# Patient Record
Sex: Female | Born: 1950 | Race: White | Hispanic: No | Smoking: Former smoker
Health system: Southern US, Community
[De-identification: ages and names within clinical notes are randomized; demographics above are authoritative.]

## PROBLEM LIST (undated history)

## (undated) DIAGNOSIS — K219 Gastro-esophageal reflux disease without esophagitis: Secondary | ICD-10-CM

## (undated) DIAGNOSIS — I2510 Atherosclerotic heart disease of native coronary artery without angina pectoris: Secondary | ICD-10-CM

## (undated) DIAGNOSIS — D045 Carcinoma in situ of skin of trunk: Secondary | ICD-10-CM

## (undated) DIAGNOSIS — R112 Nausea with vomiting, unspecified: Secondary | ICD-10-CM

## (undated) DIAGNOSIS — Z9889 Other specified postprocedural states: Secondary | ICD-10-CM

## (undated) DIAGNOSIS — K2100 Gastro-esophageal reflux disease with esophagitis, without bleeding: Secondary | ICD-10-CM

## (undated) DIAGNOSIS — I7300 Raynaud's syndrome without gangrene: Secondary | ICD-10-CM

## (undated) DIAGNOSIS — D649 Anemia, unspecified: Secondary | ICD-10-CM

## (undated) DIAGNOSIS — R7303 Prediabetes: Secondary | ICD-10-CM

## (undated) DIAGNOSIS — J849 Interstitial pulmonary disease, unspecified: Secondary | ICD-10-CM

## (undated) DIAGNOSIS — Z87442 Personal history of urinary calculi: Secondary | ICD-10-CM

## (undated) DIAGNOSIS — Z1211 Encounter for screening for malignant neoplasm of colon: Secondary | ICD-10-CM

## (undated) DIAGNOSIS — I639 Cerebral infarction, unspecified: Secondary | ICD-10-CM

## (undated) DIAGNOSIS — F17201 Nicotine dependence, unspecified, in remission: Secondary | ICD-10-CM

## (undated) DIAGNOSIS — R0989 Other specified symptoms and signs involving the circulatory and respiratory systems: Secondary | ICD-10-CM

## (undated) DIAGNOSIS — E782 Mixed hyperlipidemia: Secondary | ICD-10-CM

## (undated) DIAGNOSIS — Z8719 Personal history of other diseases of the digestive system: Secondary | ICD-10-CM

## (undated) DIAGNOSIS — H269 Unspecified cataract: Secondary | ICD-10-CM

## (undated) DIAGNOSIS — C44612 Basal cell carcinoma of skin of right upper limb, including shoulder: Secondary | ICD-10-CM

## (undated) DIAGNOSIS — I10 Essential (primary) hypertension: Secondary | ICD-10-CM

## (undated) DIAGNOSIS — I739 Peripheral vascular disease, unspecified: Secondary | ICD-10-CM

## (undated) DIAGNOSIS — R7401 Elevation of levels of liver transaminase levels: Secondary | ICD-10-CM

## (undated) DIAGNOSIS — R0602 Shortness of breath: Secondary | ICD-10-CM

## (undated) DIAGNOSIS — N3281 Overactive bladder: Secondary | ICD-10-CM

## (undated) DIAGNOSIS — I341 Nonrheumatic mitral (valve) prolapse: Secondary | ICD-10-CM

## (undated) DIAGNOSIS — F419 Anxiety disorder, unspecified: Secondary | ICD-10-CM

## (undated) DIAGNOSIS — M8580 Other specified disorders of bone density and structure, unspecified site: Secondary | ICD-10-CM

## (undated) DIAGNOSIS — I6529 Occlusion and stenosis of unspecified carotid artery: Secondary | ICD-10-CM

## (undated) DIAGNOSIS — T7840XA Allergy, unspecified, initial encounter: Secondary | ICD-10-CM

## (undated) DIAGNOSIS — J302 Other seasonal allergic rhinitis: Secondary | ICD-10-CM

## (undated) DIAGNOSIS — J449 Chronic obstructive pulmonary disease, unspecified: Secondary | ICD-10-CM

## (undated) DIAGNOSIS — C439 Malignant melanoma of skin, unspecified: Secondary | ICD-10-CM

## (undated) DIAGNOSIS — K921 Melena: Secondary | ICD-10-CM

## (undated) HISTORY — PX: HERNIA REPAIR: SHX51

## (undated) HISTORY — DX: Elevation of levels of liver transaminase levels: R74.01

## (undated) HISTORY — DX: Essential (primary) hypertension: I10

## (undated) HISTORY — DX: Other specified postprocedural states: Z98.890

## (undated) HISTORY — DX: Raynaud's syndrome without gangrene: I73.00

## (undated) HISTORY — DX: Overactive bladder: N32.81

## (undated) HISTORY — DX: Chronic obstructive pulmonary disease, unspecified: J44.9

## (undated) HISTORY — DX: Gastro-esophageal reflux disease with esophagitis, without bleeding: K21.00

## (undated) HISTORY — PX: COLONOSCOPY: SHX174

## (undated) HISTORY — DX: Melena: K92.1

## (undated) HISTORY — DX: Cerebral infarction, unspecified: I63.9

## (undated) HISTORY — DX: Nicotine dependence, unspecified, in remission: F17.201

## (undated) HISTORY — DX: Interstitial pulmonary disease, unspecified: J84.9

## (undated) HISTORY — DX: Basal cell carcinoma of skin of right upper limb, including shoulder: C44.612

## (undated) HISTORY — DX: Unspecified cataract: H26.9

## (undated) HISTORY — DX: Personal history of other diseases of the digestive system: Z87.19

## (undated) HISTORY — DX: Other seasonal allergic rhinitis: J30.2

## (undated) HISTORY — DX: Occlusion and stenosis of unspecified carotid artery: I65.29

## (undated) HISTORY — DX: Prediabetes: R73.03

## (undated) HISTORY — DX: Carcinoma in situ of skin of trunk: D04.5

## (undated) HISTORY — DX: Peripheral vascular disease, unspecified: I73.9

## (undated) HISTORY — DX: Other specified symptoms and signs involving the circulatory and respiratory systems: R09.89

## (undated) HISTORY — DX: Gastro-esophageal reflux disease without esophagitis: K21.9

## (undated) HISTORY — DX: Anxiety disorder, unspecified: F41.9

## (undated) HISTORY — DX: Gilbert syndrome: E80.4

## (undated) HISTORY — DX: Allergy, unspecified, initial encounter: T78.40XA

## (undated) HISTORY — PX: SINOSCOPY: SHX187

## (undated) HISTORY — DX: Nonrheumatic mitral (valve) prolapse: I34.1

## (undated) HISTORY — DX: Malignant melanoma of skin, unspecified: C43.9

## (undated) HISTORY — PX: OTHER SURGICAL HISTORY: SHX169

## (undated) HISTORY — DX: Encounter for screening for malignant neoplasm of colon: Z12.11

## (undated) HISTORY — DX: Shortness of breath: R06.02

## (undated) HISTORY — DX: Mixed hyperlipidemia: E78.2

## (undated) HISTORY — PX: TUBAL LIGATION: SHX77

## (undated) HISTORY — DX: Gastro-esophageal reflux disease with esophagitis: K21.0

## (undated) HISTORY — PX: ESOPHAGOGASTRODUODENOSCOPY: SHX1529

## (undated) HISTORY — DX: Other specified disorders of bone density and structure, unspecified site: M85.80

## (undated) HISTORY — DX: Nausea with vomiting, unspecified: R11.2

---

## 1995-09-27 HISTORY — PX: ABDOMINAL HYSTERECTOMY: SHX81

## 1998-07-06 ENCOUNTER — Ambulatory Visit (HOSPITAL_COMMUNITY): Admission: RE | Admit: 1998-07-06 | Discharge: 1998-07-06 | Payer: Self-pay

## 1998-08-05 ENCOUNTER — Encounter: Payer: Self-pay | Admitting: General Surgery

## 1998-08-06 ENCOUNTER — Ambulatory Visit (HOSPITAL_COMMUNITY): Admission: RE | Admit: 1998-08-06 | Discharge: 1998-08-07 | Payer: Self-pay | Admitting: General Surgery

## 1998-09-11 ENCOUNTER — Ambulatory Visit (HOSPITAL_COMMUNITY): Admission: RE | Admit: 1998-09-11 | Discharge: 1998-09-11 | Payer: Self-pay | Admitting: Gastroenterology

## 1998-09-26 HISTORY — PX: CHOLECYSTECTOMY: SHX55

## 1998-12-15 ENCOUNTER — Encounter: Payer: Self-pay | Admitting: Gastroenterology

## 1998-12-15 ENCOUNTER — Ambulatory Visit (HOSPITAL_COMMUNITY): Admission: RE | Admit: 1998-12-15 | Discharge: 1998-12-15 | Payer: Self-pay | Admitting: Gastroenterology

## 1998-12-30 ENCOUNTER — Ambulatory Visit (HOSPITAL_COMMUNITY): Admission: RE | Admit: 1998-12-30 | Discharge: 1998-12-30 | Payer: Self-pay | Admitting: Gastroenterology

## 1998-12-30 ENCOUNTER — Encounter: Payer: Self-pay | Admitting: Gastroenterology

## 1999-08-01 ENCOUNTER — Emergency Department (HOSPITAL_COMMUNITY): Admission: EM | Admit: 1999-08-01 | Discharge: 1999-08-01 | Payer: Self-pay | Admitting: *Deleted

## 1999-08-02 ENCOUNTER — Encounter: Payer: Self-pay | Admitting: Gastroenterology

## 1999-08-02 ENCOUNTER — Ambulatory Visit (HOSPITAL_COMMUNITY): Admission: RE | Admit: 1999-08-02 | Discharge: 1999-08-02 | Payer: Self-pay | Admitting: Gastroenterology

## 2000-05-31 ENCOUNTER — Ambulatory Visit (HOSPITAL_COMMUNITY): Admission: RE | Admit: 2000-05-31 | Discharge: 2000-05-31 | Payer: Self-pay | Admitting: Obstetrics and Gynecology

## 2000-05-31 ENCOUNTER — Encounter: Payer: Self-pay | Admitting: Obstetrics and Gynecology

## 2000-06-06 ENCOUNTER — Encounter: Admission: RE | Admit: 2000-06-06 | Discharge: 2000-06-06 | Payer: Self-pay | Admitting: Obstetrics and Gynecology

## 2000-06-06 ENCOUNTER — Encounter: Payer: Self-pay | Admitting: Obstetrics and Gynecology

## 2001-06-07 ENCOUNTER — Encounter: Admission: RE | Admit: 2001-06-07 | Discharge: 2001-06-07 | Payer: Self-pay | Admitting: Obstetrics and Gynecology

## 2001-06-07 ENCOUNTER — Encounter: Payer: Self-pay | Admitting: Obstetrics and Gynecology

## 2002-08-19 ENCOUNTER — Ambulatory Visit (HOSPITAL_COMMUNITY): Admission: RE | Admit: 2002-08-19 | Discharge: 2002-08-19 | Payer: Self-pay | Admitting: Gastroenterology

## 2002-08-19 ENCOUNTER — Encounter (INDEPENDENT_AMBULATORY_CARE_PROVIDER_SITE_OTHER): Payer: Self-pay | Admitting: *Deleted

## 2002-10-23 ENCOUNTER — Emergency Department (HOSPITAL_COMMUNITY): Admission: EM | Admit: 2002-10-23 | Discharge: 2002-10-23 | Payer: Self-pay | Admitting: Emergency Medicine

## 2002-10-23 ENCOUNTER — Encounter: Payer: Self-pay | Admitting: Emergency Medicine

## 2003-03-12 ENCOUNTER — Ambulatory Visit: Admission: RE | Admit: 2003-03-12 | Discharge: 2003-03-12 | Payer: Self-pay | Admitting: Gynecology

## 2003-03-12 ENCOUNTER — Encounter (INDEPENDENT_AMBULATORY_CARE_PROVIDER_SITE_OTHER): Payer: Self-pay | Admitting: *Deleted

## 2003-03-25 ENCOUNTER — Encounter: Admission: RE | Admit: 2003-03-25 | Discharge: 2003-03-25 | Payer: Self-pay | Admitting: Gynecology

## 2003-03-25 ENCOUNTER — Encounter: Payer: Self-pay | Admitting: Gynecology

## 2004-03-25 ENCOUNTER — Encounter: Admission: RE | Admit: 2004-03-25 | Discharge: 2004-03-25 | Payer: Self-pay | Admitting: Gynecology

## 2004-04-07 ENCOUNTER — Ambulatory Visit: Admission: RE | Admit: 2004-04-07 | Discharge: 2004-04-07 | Payer: Self-pay | Admitting: Gynecology

## 2004-05-12 ENCOUNTER — Ambulatory Visit (HOSPITAL_COMMUNITY): Admission: RE | Admit: 2004-05-12 | Discharge: 2004-05-12 | Payer: Self-pay | Admitting: Gynecology

## 2004-06-16 ENCOUNTER — Ambulatory Visit: Admission: RE | Admit: 2004-06-16 | Discharge: 2004-06-16 | Payer: Self-pay | Admitting: Gynecologic Oncology

## 2004-09-26 DIAGNOSIS — I639 Cerebral infarction, unspecified: Secondary | ICD-10-CM

## 2004-09-26 DIAGNOSIS — G459 Transient cerebral ischemic attack, unspecified: Secondary | ICD-10-CM

## 2004-09-26 HISTORY — DX: Cerebral infarction, unspecified: I63.9

## 2004-09-26 HISTORY — DX: Transient cerebral ischemic attack, unspecified: G45.9

## 2004-12-09 ENCOUNTER — Other Ambulatory Visit: Admission: RE | Admit: 2004-12-09 | Discharge: 2004-12-09 | Payer: Self-pay | Admitting: Gynecology

## 2004-12-27 ENCOUNTER — Ambulatory Visit (HOSPITAL_COMMUNITY): Admission: RE | Admit: 2004-12-27 | Discharge: 2004-12-27 | Payer: Self-pay | Admitting: Vascular Surgery

## 2004-12-27 ENCOUNTER — Observation Stay (HOSPITAL_COMMUNITY): Admission: EM | Admit: 2004-12-27 | Discharge: 2004-12-28 | Payer: Self-pay | Admitting: Emergency Medicine

## 2005-01-21 ENCOUNTER — Ambulatory Visit: Admission: RE | Admit: 2005-01-21 | Discharge: 2005-01-21 | Payer: Self-pay | Admitting: Gynecology

## 2005-03-06 ENCOUNTER — Encounter: Admission: RE | Admit: 2005-03-06 | Discharge: 2005-03-06 | Payer: Self-pay | Admitting: Neurology

## 2005-04-01 ENCOUNTER — Ambulatory Visit (HOSPITAL_COMMUNITY): Admission: RE | Admit: 2005-04-01 | Discharge: 2005-04-02 | Payer: Self-pay | Admitting: *Deleted

## 2005-06-08 ENCOUNTER — Encounter: Admission: RE | Admit: 2005-06-08 | Discharge: 2005-06-08 | Payer: Self-pay | Admitting: Gynecology

## 2005-06-30 ENCOUNTER — Other Ambulatory Visit: Admission: RE | Admit: 2005-06-30 | Discharge: 2005-06-30 | Payer: Self-pay | Admitting: Gynecology

## 2005-07-23 ENCOUNTER — Emergency Department (HOSPITAL_COMMUNITY): Admission: EM | Admit: 2005-07-23 | Discharge: 2005-07-23 | Payer: Self-pay | Admitting: Emergency Medicine

## 2005-09-26 HISTORY — PX: OTHER SURGICAL HISTORY: SHX169

## 2005-09-26 HISTORY — PX: TRANSTHORACIC ECHOCARDIOGRAM: SHX275

## 2005-12-29 ENCOUNTER — Other Ambulatory Visit: Admission: RE | Admit: 2005-12-29 | Discharge: 2005-12-29 | Payer: Self-pay | Admitting: Gynecology

## 2006-01-18 ENCOUNTER — Inpatient Hospital Stay (HOSPITAL_COMMUNITY): Admission: EM | Admit: 2006-01-18 | Discharge: 2006-01-19 | Payer: Self-pay | Admitting: Emergency Medicine

## 2006-02-08 ENCOUNTER — Encounter: Admission: RE | Admit: 2006-02-08 | Discharge: 2006-02-08 | Payer: Self-pay | Admitting: *Deleted

## 2006-05-30 ENCOUNTER — Ambulatory Visit (HOSPITAL_COMMUNITY): Admission: RE | Admit: 2006-05-30 | Discharge: 2006-05-30 | Payer: Self-pay | Admitting: *Deleted

## 2006-06-23 ENCOUNTER — Ambulatory Visit (HOSPITAL_COMMUNITY): Admission: RE | Admit: 2006-06-23 | Discharge: 2006-06-23 | Payer: Self-pay | Admitting: *Deleted

## 2006-07-06 ENCOUNTER — Encounter: Admission: RE | Admit: 2006-07-06 | Discharge: 2006-07-06 | Payer: Self-pay | Admitting: Gynecology

## 2006-07-19 ENCOUNTER — Other Ambulatory Visit: Admission: RE | Admit: 2006-07-19 | Discharge: 2006-07-19 | Payer: Self-pay | Admitting: Gynecology

## 2007-01-10 ENCOUNTER — Other Ambulatory Visit: Admission: RE | Admit: 2007-01-10 | Discharge: 2007-01-10 | Payer: Self-pay | Admitting: Gynecology

## 2007-02-01 ENCOUNTER — Ambulatory Visit: Payer: Self-pay | Admitting: *Deleted

## 2007-02-15 ENCOUNTER — Encounter (INDEPENDENT_AMBULATORY_CARE_PROVIDER_SITE_OTHER): Payer: Self-pay | Admitting: Gastroenterology

## 2007-02-15 ENCOUNTER — Ambulatory Visit (HOSPITAL_COMMUNITY): Admission: RE | Admit: 2007-02-15 | Discharge: 2007-02-15 | Payer: Self-pay | Admitting: Gastroenterology

## 2007-07-09 ENCOUNTER — Encounter: Admission: RE | Admit: 2007-07-09 | Discharge: 2007-07-09 | Payer: Self-pay | Admitting: Gynecology

## 2007-07-10 ENCOUNTER — Other Ambulatory Visit: Admission: RE | Admit: 2007-07-10 | Discharge: 2007-07-10 | Payer: Self-pay | Admitting: Gynecology

## 2007-08-02 ENCOUNTER — Ambulatory Visit: Payer: Self-pay | Admitting: *Deleted

## 2007-08-06 ENCOUNTER — Ambulatory Visit (HOSPITAL_COMMUNITY): Admission: RE | Admit: 2007-08-06 | Discharge: 2007-08-06 | Payer: Self-pay | Admitting: *Deleted

## 2007-08-06 ENCOUNTER — Ambulatory Visit: Payer: Self-pay | Admitting: *Deleted

## 2007-08-21 ENCOUNTER — Inpatient Hospital Stay (HOSPITAL_COMMUNITY): Admission: RE | Admit: 2007-08-21 | Discharge: 2007-08-22 | Payer: Self-pay | Admitting: *Deleted

## 2007-08-21 ENCOUNTER — Encounter (INDEPENDENT_AMBULATORY_CARE_PROVIDER_SITE_OTHER): Payer: Self-pay | Admitting: *Deleted

## 2007-08-21 HISTORY — PX: CAROTID ENDARTERECTOMY: SUR193

## 2007-08-30 ENCOUNTER — Ambulatory Visit: Payer: Self-pay | Admitting: *Deleted

## 2008-01-17 ENCOUNTER — Ambulatory Visit: Payer: Self-pay | Admitting: *Deleted

## 2008-07-03 ENCOUNTER — Ambulatory Visit: Payer: Self-pay | Admitting: *Deleted

## 2008-07-24 ENCOUNTER — Encounter: Admission: RE | Admit: 2008-07-24 | Discharge: 2008-07-24 | Payer: Self-pay | Admitting: Gynecology

## 2008-10-02 ENCOUNTER — Other Ambulatory Visit: Admission: RE | Admit: 2008-10-02 | Discharge: 2008-10-02 | Payer: Self-pay | Admitting: Family Medicine

## 2009-01-01 ENCOUNTER — Ambulatory Visit: Payer: Self-pay | Admitting: *Deleted

## 2009-06-30 ENCOUNTER — Ambulatory Visit: Payer: Self-pay | Admitting: Vascular Surgery

## 2009-08-06 ENCOUNTER — Encounter: Admission: RE | Admit: 2009-08-06 | Discharge: 2009-08-06 | Payer: Self-pay | Admitting: Gynecology

## 2010-08-06 ENCOUNTER — Ambulatory Visit: Payer: Self-pay | Admitting: Vascular Surgery

## 2010-10-17 ENCOUNTER — Encounter: Payer: Self-pay | Admitting: Gynecology

## 2011-02-08 NOTE — Procedures (Signed)
CAROTID DUPLEX EXAM   INDICATION:  Followup, carotid artery disease.   HISTORY:  Diabetes:  No.  Cardiac:  Mitral valve prolapse.  Hypertension:  Yes.  Smoking:  Quit.  Previous Surgery:  Right CEA with DPA on 08/21/07 by Dr. Madilyn Fireman.  Left  CCA stent, 03/31/05.  Right innominate stent on 06/23/06.  CV History:  Asymptomatic.  Amaurosis Fugax No, Paresthesias No, Hemiparesis No                                       RIGHT             LEFT  Brachial systolic pressure:         97                103  Brachial Doppler waveforms:         Triphasic         Triphasic  Vertebral direction of flow:        Antegrade         Antegrade  DUPLEX VELOCITIES (cm/sec)  CCA peak systolic                   P=97, M=93        P=158, M=107  ECA peak systolic                   276               160  ICA peak systolic                   103               153  ICA end diastolic                   32                50  PLAQUE MORPHOLOGY:                  Mixed in ECA      Calcified  PLAQUE AMOUNT:                      Moderate in ECA   Mild/moderate  PLAQUE LOCATION:                    ECA               ICA/ECA   IMPRESSION:  1. Right internal carotid artery shows no evidence of restenosis,      status post carotid endarterectomy.  2. Left internal carotid artery shows evidence of 40-59% stenosis.  3. Right external carotid artery stenosis.  4. Proximal left common carotid artery at 158 cm/s, right innominate      at 226 cm/s, right subclavian artery at 260 cm/s, and left      subclavian artery at 243 cm/s.All appear elevated and stable from      previous.   ___________________________________________  P. Liliane Bade, M.D.   AS/MEDQ  D:  01/17/2008  T:  01/17/2008  Job:  [id number]

## 2011-02-08 NOTE — H&P (Signed)
NAMERAEGEN, TARPLEY               ACCOUNT NO.:  [id number]   MEDICAL RECORD NO.:  [id number]          PATIENT TYPE:  INP   LOCATION:  2550                         FACILITY:  [facility]   PHYSICIAN:  Balinda Quails, M.D.    DATE OF BIRTH:  06/11/1951   DATE OF ADMISSION:  08/21/2007  DATE OF DISCHARGE:                              HISTORY & PHYSICAL   CARDIOLOGIST:  Catalina Gravel, MD.   PRIMARY CARE PHYSICIAN:  Marinda Elk, MD.   ADMISSION DIAGNOSIS:  Severe right internal carotid artery stenosis.   HISTORY:  [name] is a 60 year old female with aggressive  atherosclerotic vascular disease.  She is cared for regularly by Dr.  Eldridge Dace for cardiology-related problems.  She does not use tobacco  products.   She has a history of a left common carotid ostial stent and reduced  stenting of the innominate artery.  She is followed in the [facility] office  with routine surveillance, carotid Dopplers, and physical examination.   Recent follow-up carotid Doppler revealed progression of right internal  carotid artery stenosis greater than 80%.  She has been free of any  symptoms.  Denies sensory, motor, or visual deficit.  No speech  problems.   [name] has noted some lightheadedness.   She underwent a cerebral arteriogram a couple of weeks ago, which  verified severe right internal carotid artery stenosis.  The innominate  stent and left common carotid artery stent were widely patent.   PAST MEDICAL HISTORY:  1. Hyperlipidemia  2. Mitral valve prolapse.  3. Extracranial cerebrovascular occlusive disease.  4. Hypertension.   PAST SURGICAL HISTORY:  1. Hysterectomy in 1997.  2. Cholecystectomy in 2000.  3. Innominate stent.  4. Left common carotid artery stent.   MEDICATIONS:  1. Lisinopril 5 mg daily.  2. Crestor 20 mg daily.  3. Valium 2 mg.  4. Niaspan 1000 mg daily.  5. Aspirin 325 mg daily.  6. Nexium 40 mg daily.  7. Plavix 75 mg daily.  8. Zetia 10 mg daily.  9. Folic acid  800 mg daily.  10.Magnesium 250 mg daily.   ALLERGIES:  NONE KNOWN.   FAMILY HISTORY:  [name] has a strong family history of atherosclerotic  vascular disease.  Her mother died of an MI in her 70s.   SOCIAL HISTORY:  The patient is married and lives with her husband.  She  has one grown child.  She works two jobs.  Does not consume tobacco  products.  Does not consume alcohol.   REVIEW OF SYSTEMS:  The patient notes some lightheadedness.  Weight is  stable.  Appetite good.  No cough or sputum production.  No chest pain.  Bowel habits regular.  No dysuria.   PHYSICAL EXAMINATION:  GENERAL:  Well-appearing 60 year old female,  alert and oriented.  No acute distress.  VITAL SIGNS:  BP 150/90, pulse 100 per minute, respirations 16 per  minute, O2 sat 99%.  HEENT:  Mouth and throat are clear.  Normocephalic.  Extraocular  movements intact.  NECK:  Supple.  No thyromegaly or adenopathy.  CARDIOVASCULAR:  Right carotid bruit.  Normal heart sounds without  murmurs.  Regular rate and rhythm.  CHEST:Air entry equal bilaterally.  No rales or rhonchi.  ABDOMEN:  Soft, nontender.  Scaphoid.  No masses or organomegaly.  Normal bowel sounds.  EXTREMITIES:  Full range of motion.  No joint deformity.  No ankle  swelling.  NEUROLOGIC:  Cranial nerves intact.  Strength equal bilaterally.  Normal  reflexes.  SKIN:  Warm and dry.  No rash or ulceration.   IMPRESSION:  1. Severe progressive asymptomatic right internal carotid artery      stenosis.  2. Hypertension.  3. Hyperlipidemia.  4. Mitral valve prolapse.   RECOMMENDATION:  The patient admitted for elective right carotid  endarterectomy.  Potential risks of the operative procedure were  explained to the patient in detail.  Major risks being morbidity and  mortality 2% to include but not limited to MI, CVA, cranial nerve  injury, and death.      Balinda Quails, M.D.  Electronically Signed     PGH/MEDQ  D:  08/21/2007  T:   08/21/2007  Job:  [id number]   cc:   Corky Crafts, MD  Doris Cheadle Foy [place], M.D.

## 2011-02-08 NOTE — Consult Note (Signed)
[name], [name]               ACCOUNT NO.:  [id number]   MEDICAL RECORD NO.:  [id number]          PATIENT TYPE:  AMB   LOCATION:  SDS                          FACILITY:  [facility]   PHYSICIAN:  Marin Roberts, MDDATE OF BIRTH:  1951/03/12   DATE OF CONSULTATION:  DATE OF DISCHARGE:                                 CONSULTATION   REASON FOR CONSULTATION:  Intracranial interpretation of carotid  arteriogram.   FINDINGS:  AP and lateral intracranial projections were submitted from  the patient's right carotid arteriogram performed August 06, 2007.  This demonstrates normal appearance of the distal internal carotid  artery.  The A1 and M1 segments were normal.  There was no significant  stenosis, aneurysm, or branch vessel occlusion.  The anterior  communicating artery is patent with both A2 segments filling from this  injection.  The right transverse sinus is dominant.  There is minimal  flow to posterior cerebral systems via patent posterior communicating  artery.   IMPRESSION:  Normal appearance of intracranial imaging from the  patient's right carotid arteriogram.      Marin Roberts, MD  Electronically Signed     CM/MEDQ  D:  08/06/2007  T:  08/06/2007  Job:  [id number]

## 2011-02-08 NOTE — Op Note (Signed)
[name], MAPEL               ACCOUNT NO.:  [id number]   MEDICAL RECORD NO.:  [id number]          PATIENT TYPE:  AMB   LOCATION:  ENDO                         [facility]ACILITY:  [facility]   PHYSICIAN:  Petra Kuba, M.D.    DATE OF BIRTH:  1950-10-23   DATE OF PROCEDURE:  02/15/2007  DATE OF DISCHARGE:                               OPERATIVE REPORT   PROCEDURE:  Colonoscopy.   INDICATIONS FOR PROCEDURE:  History of colon polyps, due for colonic  screening.  Consent was signed prior to any premeds given after risks,  benefits, methods, options thoroughly discussed multiple times in the  past.   MEDICATIONS:  Medicines used per anesthesia, combination of Diprivan and  Versed, possibly Zofran and fentanyl.   PROCEDURE:  Rectal inspection was pertinent for external hemorrhoids.  Digital examination was negative.  Video pediatric colonoscope was  inserted and despite a long, looping, tortuous colon, was easily able to  advance around the colon to the cecum.  This did not require pressure or  any position changes.  The cecum was identified by the appendiceal  orifice and the ileocecal valve.  Scope was __________ into the terminal  ileum which was normal.  Photodocumentation was obtained.   The scope was slowly withdrawn.  The prep was adequate.  There was  minimal liquid stool that required washing and suctioning on slow  withdrawal through the colon.  The cecum, ascending and transverse were  normal.  There were 2 tiny, left-sided polyps, 1 in the mid descending  and 1 in the distal sigmoid, both were cold biopsied x2, put in the same  container.  No other abnormalities were seen as we slowly withdrew back  to the rectum.  Once back in the rectum, anorectal pull through and  retroflexion confirmed some small hemorrhoids.  The scope was drained.  Air was suctioned.  Scope was removed.   The patient tolerated this part of the procedure well.  There were no  obvious immediate  complications.   ENDOSCOPIC DIAGNOSES:  1. Internal and external hemorrhoids.  2. Two tiny left-sided polyps, one distal sigmoid and one mid      descending, status post cold biopsy.  3. Otherwise within normal limits to the terminal ileum   PLAN:  1. Await pathology.  2. Probably recheck colon screening in 5 years.  3. Continue workup with an esophagogastroduodenoscopy.  Please that      dictation for other workup plans and recommendations.   cc:  __________           ______________________________  Petra Kuba, M.D.     MEM/MEDQ  D:  02/15/2007  T:  02/15/2007  Job:  [id number]

## 2011-02-08 NOTE — Procedures (Signed)
CAROTID DUPLEX EXAM   INDICATION:  Carotid disease.   HISTORY:  Diabetes:  No.  Cardiac:  Mitral valve prolapse.  Hypertension:  Yes.  Smoking:  Previous.  Previous Surgery:  Right carotid endarterectomy on 08/21/2007,  innominate artery stent on 06/23/2006, left common carotid artery stent  on 03/31/2005.  CV History:  Currently asymptomatic.  Amaurosis Fugax No, Paresthesias No, Hemiparesis No                                       RIGHT             LEFT  Brachial systolic pressure:         98                110  Brachial Doppler waveforms:         Normal            Normal  Vertebral direction of flow:        Antegrade         Antegrade  DUPLEX VELOCITIES (cm/sec)  CCA peak systolic                   103               96  ECA peak systolic                   201               123  ICA peak systolic                   101               140  ICA end diastolic                   28                61  PLAQUE MORPHOLOGY:                  Soft              Mixed  PLAQUE AMOUNT:                      Moderate          Moderate  PLAQUE LOCATION:                    ECA               ICA   IMPRESSION:  1. Patent right carotid endarterectomy site with no evidence of right      internal carotid artery stenosis.  2. 40%-59% stenosis of the left internal carotid artery.  3. Patent innominate and left common carotid artery stents.  4. Velocities of 238 cm/s and 346 cm/s noted in the innominate and      right subclavian arteries, respectively.  5. No significant change noted when compared to the previous exam on      01/01/2009.   ___________________________________________  Di Kindle. Edilia Bo, M.D.   CH/MEDQ  D:  07/01/2009  T:  07/01/2009  Job:  [id number]

## 2011-02-08 NOTE — Op Note (Signed)
[name], [name]               ACCOUNT NO.:  [id number]   MEDICAL RECORD NO.:  [id number]          PATIENT TYPE:  AMB   LOCATION:  ENDO                         FACILITY:  [facility]   PHYSICIAN:  Petra Kuba, M.D.    DATE OF BIRTH:  01/26/1951   DATE OF PROCEDURE:  02/15/2007  DATE OF DISCHARGE:                               OPERATIVE REPORT   PROCEDURE:  EGD with biopsy.   INDICATIONS:  Upper tract symptoms.  Consent was signed after risks,  benefits, methods, options thoroughly discussed multiple times in the  past.   MEDICINES USED PER ANESTHESIA:  A combination of Diprivan, Versed, and  possibly some fentanyl and Zofran.   PROCEDURE:  The video endoscope was inserted by direct vision.  The  esophagus was normal.  She did have a small hiatal hernia with a widely  patent ring at the time.  The scope passed into the stomach and advanced  to the antrum, pertinent for some minimal antritis and a few tiny  erosions and then advanced through a normal pylorus into a normal  duodenal bulb and around the C-loop to a normal second portion of the  duodenum.  The scope was withdrawn back to the bulb, and a good look  there ruled out abnormalities in that location.  The scope was withdrawn  back to the stomach and retroflexed.  The cardia was normal as was the  fundus.  The angularis, lesser and greater curve were normal on  retroflexed visualization.  Straight visualization of the stomach did  not reveal any additional findings.  I  went ahead and took a few  biopsies of the antrum and a few of the proximal stomach to rule out  Helicobacter.  The air was suctioned, and the scope was slowly withdrawn  again.  A good look at the esophagus was normal.  The scope was removed.  The patient tolerated the procedure well.  There was no obvious  immediate complication.   ENDOSCOPIC DIAGNOSES:  1. Small hiatal hernia with a widely patent fibrous thin ring.  2. Minimal antritis, few erosions,  status post biopsy and proximal      stomach biopsy as well to rule out      Helicobacter.  3. Otherwise normal esophagogastroduodenoscopy.   PLAN:  Await pathology.  Followup p.r.n.  Continue pump inhibitors.           ______________________________  Petra Kuba, M.D.     MEM/MEDQ  D:  02/15/2007  T:  02/15/2007  Job:  [id number]   cc:   Balinda Quails, M.D.  [address]  [place]  [place] [address]   Corky Crafts, MD  Fax: [phone]

## 2011-02-08 NOTE — Assessment & Plan Note (Signed)
OFFICE VISIT   SUKHMAN, KOCHER A  DOB:  12/28/1950                                       08/06/2010  ZOXWR#:[id number]   HISTORY OF PRESENT ILLNESS:  This is a 60 year old female with multiple  bilateral carotid interventions who presents for routine follow-up.  Previously, she has had a right carotid endarterectomy done August 21, 2007; she had an innominate stent placed on June 23, 2006 and a  left common carotid artery stent March 31, 2005.  Her last vascular study  was July 02, 2009.  At that point it demonstrated a widely patent  right internal carotid artery, status post endarterectomy and a left  internal carotid artery stenosis 40% to 59%.  She has never had any of  stroke symptoms or TIA.  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 At this point she notes blurred  central vision occasionally but no monocular blindness or amaurosis  fugax.  On her motor exam she notes no facial droop or hemiplegia.  She  also has never  had any expressive or receptive aphasia.  Risk factors  for carotid disease include hyperlipidemia, hypertension.  She does not  smoke and never has.  Risk factor management includes statin use with  Crestor.  She has been on antiplatelets, aspirin and Plavix.  Also  hypertensive medications include lisinopril. Her neurologic review of  systems was completely negative.   PAST MEDICAL HISTORY:  She has hyperlipidemia, mitral valve prolapse and  hypertension.   PAST SURGICAL HISTORY:  Includes total abdominal hysterectomy,  cholecystectomy.  She had the right carotid endarterectomy, the  innominate stent placement and the left carotid artery stent.   SOCIAL HISTORY:  No tobacco, ethanol or illicit drug use.   FAMILY HISTORY:  Father died at 60 with pneumonia.  Mother died at 36  with MI.   MEDICATIONS:  Include  lisinopril, Crestor, Valium, aspirin, Plavix,  folic acid, magnesium, fish oil.   ALLERGIES:  She has no known drug allergies.   REVIEW OF SYSTEMS:  The rest of her 12 point review of systems was noted  be negative.   PHYSICAL EXAMINATION:  She had a blood pressure 129/81, heart rate of  93, respirations were 12, saturation was 100%.  General:  She was alert and oriented x3, well-developed, well-nourished.  Head:  Normocephalic, atraumatic.  ENT:  Hearing grossly intact.  Nares without any erythema or drainage.  Oropharynx without erythema or exudate.  Eye:  Pupils were equal, round, reactive to light.  Extraocular  movements were intact.  Neck:  Supple neck with no nuchal rigidity.  No palpable  lymphadenopathy.  Her right neck incision is well healed.  Pulmonary:  Symmetric expansion.  Good air movement.  Clear auscultation  bilaterally with no rales, rhonchi or wheezing.  Cardiac:  Regular rate and rhythm.  Normal S1 and S2,  no murmurs, rubs,  thrills or gallops.  Vascular:  She had palpable radial and brachial  pulses and carotid pulses without any bruits bilaterally.  Aorta was not  palpable.  Femorals had palpable pulses; popliteals and pedals were  easily palpable.  GI:  Soft abdomen, nontender, nondistended, no guarding, rebound, no  hepatosplenomegaly.  No masses.  No costovertebral angle tenderness.  Musculoskeletal:  All extremities had 5/5 strength.  There is no obvious  ulceration or gangrene.  Neurologic:  Cranial nerves II-XII were intact.  Pain and light  were  intact in all extremities.  Motor exam was as listed above.  Psychiatric:  Judgment intact.  Mood, affect were appropriate for  clinical situation.  Skin/Extremity:  Exam was as listed above.  There are no rashes  otherwise noted.  Lymphatic: She had no  cervical, axillary, inguinal lymphadenopathy.   NONINVASIVE VASCULAR IMAGING:  She had bilateral carotid duplexes  completed.  This demonstrated on the  right side peak velocity of 103/41.  On the left she had 120/40.  Interpretation is widely patent right  internal carotid artery status post right carotid endarterectomy.  On  the left side she has a 40% to  59% stenosis of the left internal  carotid artery.  In addition, technicians noted that the innominate and  left common carotid stents were widely patent based on that duplex.   MEDICAL DECISION MAKING:  This is a 60 year old female status post right  carotid endarterectomy, innominate artery stenting and left common  carotid artery stenting that presents for routine follow-up.  She is  completely asymptomatic at this point.  Her eye complaints are  concerning for possible macular degeneration or possible glaucoma.  I  recommended she follow up with an ophthalmologist for evaluation of  such.  I am not able to do a funduscopic examination as I do not have  access to dilating drops in this office.  Her pupils constricted quite  rapidly to direct pupillary reaction, so I am not able to see into her  fundus.  With her amount of disease, she will be on an annual basis  bilateral carotid duplexes with a attempt at imaging the innominate and  the common carotid arteries.  I discussed with her continuing her  maximal medical management.  She will follow up with [place] as needed if  there is any changes in the carotids.     Leonides Sake, MD  Electronically Signed   BC/MEDQ  D:  08/06/2010  T:  08/09/2010  Job:  [id number]

## 2011-02-08 NOTE — Procedures (Signed)
CAROTID DUPLEX EXAM   INDICATION:  Follow-up evaluation of known carotid artery disease.  Previous study showed a 60-79% right ICA stenosis and a 40-59% left ICA  stenosis on Feb 01, 2007.   HISTORY:  Diabetes:  No.  Cardiac:  Mitral valve prolapse.  Hypertension:  No.  Smoking:  Quit last year.  Previous Surgery:  Left common carotid artery stent on March 31, 2005.  Right innominate artery stent on June 23, 2006.  CV History:  Patient reports frequent episodes of lightheadedness.  Amaurosis Fugax No, Paresthesias No, Hemiparesis No                                       RIGHT             LEFT  Brachial systolic pressure:         118               106  Brachial Doppler waveforms:         Triphasic         Triphasic  Vertebral direction of flow:        Antegrade         Antegrade  DUPLEX VELOCITIES (cm/sec)  CCA peak systolic                   117               170  ECA peak systolic                   199               185  ICA peak systolic                   258               155  ICA end diastolic                   112               59  PLAQUE MORPHOLOGY:                  Mixed             Calcified  PLAQUE AMOUNT:                      Moderate-to-severe                  Moderate  PLAQUE LOCATION:                    Proximal ICA, ECA Proximal ICA, ECA   Inominate artery peak systolic velocity 280 cm/s.  Proximal left common carotid artery peak systolic velocity 143 cm/s.   IMPRESSION:  1. 80-99% right internal carotid artery stenosis (low end of range).  2. 40-59% left internal carotid artery stenosis.  3. Velocities within the innominate and proximal left common carotid      artery stents are higher than previously recorded on Feb 01, 2007.   ___________________________________________  P. Liliane Bade, M.D.   MC/MEDQ  D:  08/02/2007  T:  08/03/2007  Job:  [id number]

## 2011-02-08 NOTE — Procedures (Signed)
CAROTID DUPLEX EXAM   INDICATION:  Followup evaluation of known carotid artery disease.   HISTORY:  Diabetes:  No.  Cardiac:  Mitral valve prolapse.  Hypertension:  Yes.  Smoking:  Former smoker.  Previous Surgery:  Right carotid endarterectomy with Dacron patch  angioplasty on 08/21/2007 by Dr. Madilyn Fireman.  Left common carotid artery  stent 03/31/2005.  Innominate artery stent on 06/23/2006.  CV History:  Previous duplex on 07/03/2008 revealed no right ICA  stenosis and 40-59% left ICA stenosis.  Amaurosis Fugax No, Paresthesias No, Hemiparesis No                                       RIGHT             LEFT  Brachial systolic pressure:         118               126  Brachial Doppler waveforms:         Triphasic         Triphasic  Vertebral direction of flow:        Antegrade         Antegrade  DUPLEX VELOCITIES (cm/sec)  CCA peak systolic                   107               158  ECA peak systolic                   292               168  ICA peak systolic                   91                129  ICA end diastolic                   30                50  PLAQUE MORPHOLOGY:                  Soft              Mixed  PLAQUE AMOUNT:                      Moderate          Moderate  PLAQUE LOCATION:                    Proximal ECA      Proximal ICA,  proximal ECA   IMPRESSION:  1. Innominate artery peak systolic velocity 237 cm/second.  2. 20-39% right ICA stenosis status post endarterectomy.  3. 40-59% left ICA stenosis.  4. Patent innominate and proximal left common carotid artery stents.  5. No significant change from previous study.   ___________________________________________  P. Liliane Bade, M.D.   MC/MEDQ  D:  01/01/2009  T:  01/01/2009  Job:  [id number]

## 2011-02-08 NOTE — Discharge Summary (Signed)
[name], [name]               ACCOUNT NO.:  [id number]   MEDICAL RECORD NO.:  [id number]          PATIENT TYPE:  INP   LOCATION:  3312                         FACILITY:  [facility]   PHYSICIAN:  Balinda Quails, M.D.    DATE OF BIRTH:  1951/05/12   DATE OF ADMISSION:  08/21/2007  DATE OF DISCHARGE:  08/22/2007                               DISCHARGE SUMMARY   ADMISSION DIAGNOSES:  Severe right internal carotid artery stenosis,  asymptomatic.   DISCHARGE/SECONDARY DIAGNOSES:  1. Severe right internal carotid artery stenosis, status post right      carotid endarterectomy.  2. Postoperative hypotension, improved.  3. Mild hypokalemia, supplemented.  4. History of mitral valve prolapse with subacute bacterial      endocarditis prophylaxis recommended.  5. Hyperlipidemia.  6. Hysterectomy in 1997.  7. Cholecystectomy in 2000.  8. History of colon polyps, followed by Dr. Ewing Schlein.  9. History of internal and external hemorrhoids.  10.Severe innominate artery stenosis, status post innominate artery      percutaneous transluminal angioplasty and stenting in September      2007.  11.History of severe left common carotid ostial stenosis, status post      left common carotid stenting with a 6 x 18 [employer] Aviator system      in  July 2006.  12.Right inguinal hernia surgery x 3.  13.History of ectopic pregnancy.  14.Bilateral proximal subclavian stenosis estimated at 50% per      arteriogram in November 2008.   ALLERGIES:  PENICILLIN, GI UPSET WITH NARCOTIC PAIN MEDICATIONS.   PROCEDURE:  A right carotid endarterectomy with Dacron patch angioplasty  by Dr. Denman George on August 21, 2007.   BRIEF HISTORY:  Ms. Basley is a 60 year old Caucasian female who is  well known to Dr. Madilyn Fireman.  She is status post left common artery stent  and redo  stenting of the innominate artery.  He saw her in early  November for continued surveillance of her carotid disease.  Vertebral  flow was  antegrade bilaterally, blood pressure equal in both arms.  There was triphasic brachial Doppler waveform present.  Right carotid  bifurcation did reveal elevated velocities with a stenosis consistent  with 80% to 99%.  The left internal carotid artery revealed 40% to 59%  stenosis.  There was mild elevation in the velocity of the innominate,  208 cm/second, and the proximal left common carotid artery to 143  cm/second.  She was asymptomatic of her carotid stenosis although  reporting occasional lightheadedness.  Dr. Madilyn Fireman recommended a cerebral  arteriogram, which was done on August 06, 2007, showing widely patent  innominate and left common carotid artery origin stents, moderate  bilateral proximal subclavian disease estimated be 50%, antegrade  vertebral flow present bilaterally and high-grade web-like stenosis of  the right internal carotid artery.  He recommended that she undergo  elective right carotid endarterectomy to reduce her risk for future  stroke.   HOSPITAL COURSE:  Ms. Fate was electively admitted to [facility] on August 21, 2007.  She underwent the previously mentioned  procedure.  She was extubated and neurologically intact and after short  stay in the recovery unit was transferred to the step-down unit 3300,  where she remains at discharge.  At the time of dictation, she has had  an uneventful postoperative course.  Vital signs have been stable  although she did have some asymptomatic hypotension with systolic blood  pressure as low as the mid 80s.  Blood pressure was at 120/55 by  discharge.  She did report her blood pressure has been known to be as  low as the 80s systolic preoperatively as well.  She has maintained  sinus rhythm.  She is afebrile at discharge.  Room air oxygen saturation  97% on room air.  She was able to void following Foley catheter removal  and ambulate without difficulty.  She tolerated regular diet, and pain  was controlled on  nonnarcotic medication.  Morning labs showed a sodium  of 141, potassium of 3.2 which was supplemented, BUN of 3, creatinine  0.66, blood glucose 134, white count of 14, hemoglobin 11.1, hematocrit  32.8, and platelet count 257.  Neurologically she remained intact.  Denied dysphagia.  She did have a mild right-sided headache which she  said improved with Tylenol.  Her incision was healing well without signs  of infection or hematoma.  Subsequently Ms. Levett was felt appropriate  for discharge home on postoperative day #1, August 22, 2007.  She  currently is in stable condition.   DISCHARGE MEDICATIONS:  1. Lisinopril 5 mg daily.  2. Crestor 20 mg daily.  3. Valium 2 mg daily.  4. Niaspan 1000 mg q.h.s.  5. Aspirin 81 mg 2 tablets p.o. daily.  6. Nexium 40 mg daily.  7. Plavix 75 mg daily.  8. Zetia 10 mg daily.  9. Folic acid 800 mcg daily.  10.Magnesium supplement daily.  11.Ultram 50 mg 1 to 2 tablets p.o. q.6h. p.r.n. for pain.   DISCHARGE INSTRUCTIONS:  She is to continue a heart-healthy diet.  Increase her activity slowly.  Avoid driving or heavy lifting for the  next 2 weeks.  She may shower and clean her incision gently with soap  and water.  She should call if she develops fever greater than 101,  redness or drainage from her incision sites or changes in her  neurological status.  She will see Dr. Madilyn Fireman in the [facility] office in  approximately 2 weeks.  Our office will contact her regarding specific  appointment date and time.      Jerold Coombe, P.A.      Balinda Quails, M.D.  Electronically Signed    AWZ/MEDQ  D:  08/22/2007  T:  08/22/2007  Job:  [id number]   cc:   Balinda Quails, M.D.  Corky Crafts, MD  Doris Cheadle. Foy Guadalajara, M.D.

## 2011-02-08 NOTE — Procedures (Signed)
CAROTID DUPLEX EXAM   INDICATION:  Carotid artery disease.   HISTORY:  Diabetes:  No.  Cardiac:  Mitral valve prolapse.  Hypertension:  Yes.  Smoking:  Previous.  Previous Surgery:  Right carotid endarterectomy 08/21/2007.  Innominate  artery stent 06/23/2006.  Left CCA stent 03/31/2005.  CV History:  Amaurosis Fugax Yes No, Paresthesias Yes No, Hemiparesis Yes No                                       RIGHT             LEFT  Brachial systolic pressure:         98                110  Brachial Doppler waveforms:         WNL               WNL  Vertebral direction of flow:        Antegrade         Antegrade  DUPLEX VELOCITIES (cm/sec)  CCA peak systolic                   96                106  ECA peak systolic                   155               114  ICA peak systolic                   103               120  ICA end diastolic                   41                40  PLAQUE MORPHOLOGY:                  Soft              Mixed  PLAQUE AMOUNT:                      Moderate          Moderate  PLAQUE LOCATION:                    ECA               ICA   IMPRESSION:  1. Patent and durable right carotid endarterectomy with no      hemodynamically significant stenosis.  2. 40%-59% stenosis of the left internal carotid artery.  3. Patent innominate and left common carotid artery stents.   ___________________________________________  Leonides Sake, MD   OD/MEDQ  D:  08/06/2010  T:  08/06/2010  Job:  [id number]

## 2011-02-08 NOTE — Assessment & Plan Note (Signed)
OFFICE VISIT   [name], [name]  DOB:  1951/08/21                                       08/02/2007  ZOXWR#:[id number]   [name] is [name] 60 year old female with advanced atherosclerotic disease. She  is cared for regularly by Dr. Eldridge Dace. She does not use tobacco  products. She is on Plavix and aspirin. She takes Zetia, Niaspan and  Crestor. Her other medications include Protonix, Valium, lisinopril and  folic acid with magnesium.   [name] has [name] history of left common carotid artery stent and redo  stenting of the innominate artery. She has returned at this time for  routine continued surveillance of her carotid disease.   Vertebral flow was antegrade bilaterally. Blood pressure equal in both  arms. Triphasic brachial Doppler wave form was present. Right carotid  bifurcation does reveal elevated velocities with [name] stenosis consistent  with 80-99%, lower end of the range. Left internal carotid artery  revealed 40-59% stenosis. There was mild elevation of velocities in the  innominate, 208 cm per second, and the proximal left common carotid  artery at 143 cm per second.   [name] has noted some lightheadedness. Denies visual disturbance. No  motor or sensory deficit.   She appears generally well. No acute distress. No cyanosis, pallor or  jaundice. Blood pressure is 154/99, pulse 114 per minute, respirations  16 per minute, O2 saturation 99%.   Soft right carotid bruit present. No left carotid bruit. Normal heart  sounds. No innominate or subclavian bruits.   Cranial nerves were intact. Strength equal bilaterally. Normal gait. One  plus reflexes bilaterally.   [name] shows progression of her right internal carotid artery stenosis,  now reaching the severe range.   Fortunately, she remains asymptomatic. We will plan to go ahead with [name]  cerebral arteriogram due to her multi-vessel disease. This is scheduled  for August 06, 2007 at [facility].   Balinda Quails, M.D.  Electronically Signed   PGH/MEDQ  D:  08/02/2007  T:  08/03/2007  Job:  [id number]

## 2011-02-08 NOTE — Assessment & Plan Note (Signed)
OFFICE VISIT   EREN, [name] A  DOB:  1951/06/11                                       08/30/2007  UEAVW#:[id number]   The patient underwent a right carotid endarterectomy for progressive  stenosis, carried out August 21, 2007 at [facility].  She was  discharged home postoperative day 1.  Since discharge, she has had no  major complaints.  She has noticed some mild right-sided headache and  some mild vertigo.  These are improving.   BP 115/70, pulse 90 per minute and regular, respirations 18 per minute.  Right neck incision healing well.  Cranial nerves intact.  Strength  equal bilaterally.   The patient has done well following her right carotid endarterectomy.  We will plan followup with her again in 6 months with a carotid Doppler  evaluation.   Balinda Quails, M.D.  Electronically Signed   PGH/MEDQ  D:  08/30/2007  T:  08/31/2007  Job:  [id number]   cc:   Corky Crafts, MD  Katrina Stack, NP

## 2011-02-08 NOTE — Procedures (Signed)
CAROTID DUPLEX EXAM   INDICATION:  Followup evaluation of known carotid artery disease.   HISTORY:  Diabetes:  No.  Cardiac:  Mitral valve prolapse.  Hypertension:  Yes.  Smoking:  Former smoker.  Previous Surgery:  Right carotid endarterectomy with Dacron patch  angioplasty on 08/21/2007 by Dr. Madilyn Fireman.  Left common carotid artery  stent on 03/31/2005.  Right innominate artery stent on 06/23/2006.  CV History:  The patient is asymptomatic.  Previous duplex performed  01/17/2008 revealed no right ICA stenosis status post endarterectomy and  40-59% left ICA stenosis.  The patient reports periodic dizzy spells  when standing.  Amaurosis Fugax No, Paresthesias No, Hemiparesis No                                       RIGHT             LEFT  Brachial systolic pressure:         108               118  Brachial Doppler waveforms:         Biphasic          Triphasic  Vertebral direction of flow:        Antegrade         Antegrade  DUPLEX VELOCITIES (cm/sec)  CCA peak systolic                   158               123  ECA peak systolic                   366               197  ICA peak systolic                   144               197  ICA end diastolic                   50                58  PLAQUE MORPHOLOGY:                  Soft              Calcified  PLAQUE AMOUNT:                      Moderate          Moderate  PLAQUE LOCATION:                    Mid and distal ICA, ECA             Proximal ICA, ECA   IMPRESSION:  1. No significant change from previous duplex performed 01/17/2008.      Innominate artery peak systolic velocity 171 cm/second.  2. 40-59% distal right ICA stenosis status post endarterectomy.  3. 40-59% left ICA stenosis.  4. Patent innominate artery stent.  5. Patent proximal left common carotid artery stent.  6. Bilateral ECA stenosis right worse than left.   ___________________________________________  P. Liliane Bade, M.D.   MC/MEDQ  D:   07/03/2008  T:  07/03/2008  Job:  [id number]

## 2011-02-08 NOTE — Op Note (Signed)
[name], [name]               ACCOUNT NO.:  [id number]   MEDICAL RECORD NO.:  [id number]          PATIENT TYPE:  INP   LOCATION:  2550                         FACILITY:  [facility]   PHYSICIAN:  Balinda Quails, M.D.    DATE OF BIRTH:  10-07-1950   DATE OF PROCEDURE:  08/21/2007  DATE OF DISCHARGE:                               OPERATIVE REPORT   SURGEON:  Denman George, M.D.   ASSISTANT:  Fabienne Bruns, M.D.   ANESTHETIC:  Is general endotracheal.   PREOPERATIVE DIAGNOSIS:  Severe right internal carotid artery stenosis.   POSTOPERATIVE DIAGNOSIS:  Severe right internal carotid artery stenosis.   PROCEDURE:  Right carotid endarterectomy Dacron patch angioplasty.   CLINICAL NOTE:  [name] is a 60 year old female with aggressive  atherosclerotic vascular disease.  She has previously had an innominate  stent along with a right common carotid ostial stent.  She was followed  in the office with known carotid bifurcation disease.  A right carotid  bifurcation revealed progression of plaque with a severe stenosis.  This  was verified by arteriography.  Brought to the operating at this time  for right carotid endarterectomy.  Risks and benefits of the operative  procedure explained to the patient, she consented for surgery.  Major  morbidity mortality associated with this operation include MI, CVA,  cranial nerve injury and death with a major morbidity mortality rate of  1-2%.   OPERATIVE PROCEDURE:  The patient was brought to the operating room in  stable hemodynamic condition.  Placed under general endotracheal  anesthesia.  A Foley catheter and arterial line in place.  The right  neck prepped and draped in sterile fashion.   Curvilinear skin incision was made along the anterior border of the  right sternomastoid muscle.  Dissection carried down through the  subcutaneous tissue with electrocautery.  Deep dissection carried down  through the platysma.  The sternomastoid  muscle mobilized and retracted.  The common carotid artery mobilized down the omohyoid muscle and  encircled with vessel loop.  The vagus nerve reflected posteriorly and  preserved.  The common carotid artery mobilized up to the bifurcation.  The superior thyroid and external carotid were freed and encircled with  vessel loops.   The internal carotid artery was then followed distally up to the  posterior belly of the digastric muscle.  The hypoglossal nerve  identified and preserved.  The distal internal carotid artery was  encircled with a vessel loop.   Carotid bifurcation did reveal plaque extending a couple of centimeters  up into the right internal carotid artery.  The patient administered  6000 units of heparin intravenously.  Adequate circulation time  permitted.  The carotid vessels were controlled with clamps.  Longitudinal arteriotomy made in the distal common carotid artery.  The  arteriotomy extended across the carotid bulb and up into the internal  carotid artery.  There was an ulcerated plaque with a high-grade right  internal carotid artery stenosis.  A shunt was inserted.   The plaque removed with an endarterectomy elevator.  The endarterectomy  carried down to  the common carotid artery, and plaque was divided  transversely with Potts scissors.  The distal internal carotid artery  plaque feathered out well, and the external carotid and superior thyroid  were endarterectomized using an eversion technique.  The site irrigated  with heparin, saline and dextran solutions.  Fragments of plaque removed  with fine forceps.  A good endpoint was obtained in the distal internal  carotid artery.   A patch angioplasty endarterectomy site was carried out with a Finesse  Dacron patch using running 6-0 Prolene suture.  At completion of the  patch angioplasty, the shunt was removed.  All vessels were well  flushed.  Clamps were removed directing initial antegrade flow up the   external carotid artery.  Following this, the internal carotid was  released.  Excellent pulse and Doppler signal in the distal internal  carotid artery.   The patient was administered 50 mg of protamine intravenously.  Adequate  hemostasis obtained.  Sponges and instrument counts correct.   Sternomastoid fascia was closed with a running 2-0 Vicryl suture.  Platysma closed with a running 3-0 Vicryl suture.  Skin closed with 4-0  Monocryl.  Dermabond applied.   Anesthesia reversed in the operating room.  The patient was  neurologically intact without apparent complications.  Transferred to  recovery in stable condition.      Balinda Quails, M.D.  Electronically Signed     PGH/MEDQ  D:  08/21/2007  T:  08/21/2007  Job:  [id number]   cc:   Corky Crafts, MD  Doris Cheadle Foy Guadalajara, M.D.

## 2011-02-08 NOTE — Op Note (Signed)
[name], [name]               ACCOUNT NO.:  [id number]   MEDICAL RECORD NO.:  [id number]          PATIENT TYPE:  AMB   LOCATION:  SDS                          FACILITY:  [facility]   PHYSICIAN:  Balinda Quails, M.D.    DATE OF BIRTH:  April 07, 1951   DATE OF PROCEDURE:  08/06/2007  DATE OF DISCHARGE:                               OPERATIVE REPORT   PHYSICIAN:  Balinda Quails, M.D.   DIAGNOSIS:  Right internal carotid artery stenosis.   PROCEDURE:  1. Arch aortogram.  2. Innominate arteriogram.  3. Selective right carotid arteriogram.   CLINICAL NOTE:  [name] is a 60 year old female with a known  history of aggressive atherosclerotic vascular disease.  She has  previously had a left common carotid artery stent placed at the ostium  of the left common carotid artery, and also has had two previous  interventions on her innominate artery.  She currently has an innominate  stent in place.  She has been followed in the office with carotid  Dopplers, with progression of her right internal carotid artery to a  severe stenosis at this time.  She has been symptom free.   We will plan at this time to perform an arch aortogram with right  carotid arteriography.  Indication for her arteriography is the extent  of her vascular disease, including brachiocephalic major disease.   PROCEDURE NOTE:  The patient was brought to the catheterization lab in  stable condition.  Informed consent obtained.  The right groin  was  prepped and draped in a sterile fashion.  Skin instilled with 1%  Xylocaine.  An 18-gauge needle was introduced into the right common  femoral artery.  A 0.035 Wholey guidewire was advanced through the  needle.  A 5-French sheath was advanced over guidewire.  Dilator removed  and the sheath flushed.   A long pigtail catheter was then advanced along the guidewire, and both  were advanced up into the aortic arch.  The pigtail catheter was placed  at the distal ascending aorta.   A 40-degree LAO arch aortogram was  obtained.  This reveals widely patent innominate and left common carotid  artery origin stents.  The left subclavian artery was patent, with a  moderate proximal stenosis.  Antegrade vertebral flow noted in the left  vertebral artery.  Antegrade right vertebral artery flow also present.   Over the guidewire, the catheter was exchanged for an H1 catheter.  The  H1 catheter was engaged in the innominate artery.  Guidewire passed up  into the proximal right common carotid artery.  The catheter was  advanced up into the right common carotid artery.  Right carotid  arteriography obtained.  Cervical and intracranial views obtained.  Cervical images reveal an ulcerated plaque at the right carotid  bifurcation.  A deep ulcer was present in the plaque.  The right  internal carotid artery origin reveals a weblike stenosis proximally.  This is consistent with Doppler findings of high velocities and a high-  grade stenosis.   Intracranial views were obtained in the AP and lateral projection, and  these are dictated separately by neuroradiology.   An innominate arteriogram was obtained, and this reveals the innominate  stent to be widely patent.  However, the origin of the right subclavian  artery reveals a smooth 50% stenosis.  The left common carotid artery  origin appeared to be widely patent.   This completed the arteriogram procedure.  The guidewire was reinserted  and the catheter removed.  Right femoral sheath removed.  No apparent  complications.   FINAL IMPRESSION:  1. Widely patent innominate and left common carotid artery origin      stents.  2. Moderate bilateral proximal subclavian disease estimated to be 50%.  3. Antegrade vertebral flow present bilaterally.  4. High-grade weblike stenosis, right internal carotid artery.   RECOMMENDATION AND PLAN:  The patient will be scheduled for right  carotid endarterectomy electively.  This is for  reduction of stroke risk  due to high-grade stenosis.  The patient will discontinue Plavix 7 days  prior to surgery and increase aspirin to 325 mg daily.      Balinda Quails, M.D.  Electronically Signed     PGH/MEDQ  D:  08/06/2007  T:  08/07/2007  Job:  [id number]   cc:   Corky Crafts, MD

## 2011-02-11 NOTE — Consult Note (Signed)
[name], [name]                         ACCOUNT NO.:  [id number]   MEDICAL RECORD NO.:  [id number]                   PATIENT TYPE:  OUT   LOCATION:  GYN                                  FACILITY:  [facility]   PHYSICIAN:  De Blanch, M.D.         DATE OF BIRTH:  [birth year]   DATE OF CONSULTATION:  03/12/2003  DATE OF DISCHARGE:                                   CONSULTATION   HISTORY OF PRESENT ILLNESS:  A 59 year old white married female seen in  consultation at the request of Leatha Gilding. Mezer, M.D.   The patient noted some hyperpigmented lesions on her right vulva recently.  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  Margins were positive.  The patient also had a lesion on the mons  which is a seborrheic keratosis with no dysplasia.   The patient gives a past history of having cryo therapy many years ago for  an abnormal Pap smear.  She claims her Pap smears have been normal since  that time.  She is a smoker, but is not on any immunosuppressive drugs.   PHYSICAL EXAMINATION:  VITAL SIGNS:  Height 5 feet 4 inches, weight 126  pounds, blood pressure 108/70, pulse 70, respiratory rate 18.  GENERAL:  The patient is a healthy white female in no acute distress.  HEENT:  Negative.  NECK:  Supple without thyromegaly.  ABDOMEN:  Soft, nontender.  No mass, organomegaly, ascites, or hernias  noted.  PELVIC:  EGBUS shows the well healing scar from her wide excision of the  lower portion of the vulva as well as scars in the mons and more anterior  right vulva.  Grossly, there are no other lesions noted, although there is  some hyperpigmentation at the lower aspect of the lower scar.  The vagina is  clean.  Cervix is normal.  Uterus is anterior, normal shape, size,  consistency.  There are no adnexal masses noted.   PROCEDURE NOTE:  After applying acetic acid colposcopy was performed.  No  lesions are noted except for the area of  hyperpigmentation which is slightly  raised on the right posterior vulva just below the prior excisional biopsy  site.   After 1% Xylocaine was infiltrated the lesion was excised using Eppendorfer  forceps.  Hemostasis achieved with silver nitrate.   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  At this juncture I do  not think reexision of these areas would be of any particular benefit to the  patient.  I would recommend she be followed serially.  She is given  instruction regarding warning signs and symptoms and I would recommend that  she be examined every six months in hopes of allowing early detection of any  evidence of recurrence.  We will contact her with her biopsy report.  She  will return to the care of Howard C. Mezer, M.D.  De Blanch, M.D.    DC/MEDQ  D:  03/12/2003  T:  03/13/2003  Job:  [id number]   cc:   Leatha Gilding. Mezer, M.D.  [address]  [place]  [place] [address]  Fax: [phone]   Telford Nab, R.N.

## 2011-02-11 NOTE — Op Note (Signed)
NAMEJAHNE, KRUKOWSKI               ACCOUNT NO.:  [id number]   MEDICAL RECORD NO.:  [id number]          PATIENT TYPE:  OIB   LOCATION:  NA                           FACILITY:  [facility]   PHYSICIAN:  Di Kindle. Edilia Bo, M.D.DATE OF BIRTH:  26-Apr-1951   DATE OF PROCEDURE:  12/27/2004  DATE OF DISCHARGE:                                 OPERATIVE REPORT   PREOPERATIVE DIAGNOSIS:  Left common carotid artery stenosis and right  internal carotid artery stenosis.   PROCEDURE:  1.  Arch aortogram.  2.  Selective right common carotid arteriogram.   SURGEON:  Di Kindle. Edilia Bo, M.D.   ANESTHESIA:  Local with sedation.   TECHNIQUE:  The patient was taken to the PV lab at [facility]. The groins were  prepped and draped in usual sterile fashion. After the skin was anesthetized  with 1% lidocaine, the right common femoral artery was cannulated and  guidewire introduced into the infrarenal aorta. Under fluoroscopic control,  5-French sheath was passed over the wire and the dilator was removed. The  long pigtail catheter was positioned in the ascending aortic arch and a 40  degree LAO projection was obtained of the aortic arch. The pigtail catheter  was then exchanged for an H1 catheter which was positioned into the  innominate artery. The wire was then advanced into the right common carotid  artery and then the catheter advanced over the wire. Selective right common  carotid arteriogram was obtained. Intracranial and extracranial views were  obtained. The intracranial views will be interpreted separated separately by  the neuroradiologist.   FINDINGS:  There is no significant occlusive disease of the aortic arch. The  innominate artery is widely patent with a 5-10% proximal stenosis. The right  subclavian artery is patent as is the right vertebral artery. The right  common carotid artery is widely patent. At the carotid bifurcation and  proximal internal carotid artery there is an ulcerative  plaque with a deep  ulcer; however, this only creates an approximately 40-50% stenosis. The  internal carotid artery distally is widely patent. On the left side just  past the origin of the takeoff of the left common carotid artery, there is  an approximately 85-90% stenosis. I did not selectively cannulate the left  common carotid artery as this stenosis is not surgically accessible the  patient will be evaluated for a carotid stent. The distal carotid can then  be studied at the time of the carotid stent if this is felt to be indicated  and the patient is agreeable.   The left subclavian has some mild irregularity but no significant stenosis  is appreciated.  The left vertebral artery is also patent.   CONCLUSIONS:  1.  Ulcerated plaque at the right carotid bifurcation producing an      approximately 50% stenosis.  2.  Proximal left common carotid artery stenosis of 85 to 90%.  3.  Mild left subclavian artery stenosis with no evidence of subclavian      steal.      CSD/MEDQ  D:  12/27/2004  T:  12/27/2004  Job:  [id number]   cc:   Molly Maduro L. Foy Guadalajara, M.D.  [address] [place]  [place] [address]  Fax: [phone]

## 2011-02-11 NOTE — Op Note (Signed)
[name], BERT                         ACCOUNT NO.:  [id number]   MEDICAL RECORD NO.:  [id number]                   PATIENT TYPE:  AMB   LOCATION:  DAY                                  FACILITY:  [facility]   PHYSICIAN:  De Blanch, M.D.         DATE OF BIRTH:  1951/03/20   DATE OF PROCEDURE:  05/12/2004  DATE OF DISCHARGE:                                 OPERATIVE REPORT   PREOPERATIVE DIAGNOSES:  Carcinoma in situ of the vulva.   POSTOPERATIVE DIAGNOSES:  Carcinoma in situ of the vulva.   PROCEDURE:  CO2 laser vaporization of the vulva.   SURGEON:  De Blanch, M.D.   ANESTHESIA:  LMA, general.   ESTIMATED BLOOD LOSS:  None.   FINDINGS:  Examination under anesthesia after the application of acetic acid  to the vulva, there was an area of white epithelium in the perineum  measuring approximately a centimeter.  We achieved at least a 5 mm margin  around all obvious dysplastic changes.   DESCRIPTION OF PROCEDURE:  The patient was brought to the operating room and  after satisfactory attainment of general anesthesia was placed in lithotomy  position in candy cane stirrups. The perineum, vagina and vulva were prepped  with Betadine. The patient was draped with wet towels and acetic acid was  applied to the vulva. The area of white epithelium was identified in the  perineal body.  Using the CO2 laser at 15 watts of power, the lesion and at  5 mm of normal skin in all margins was ablated to the second surgical plane.  The area was washed away of any char and Silvadene was applied. The patient  was awakened from anesthesia and taken to the recovery room in satisfactory  condition. Sponge, needle and instrument counts were correct x2.                                               De Blanch, M.D.    DC/MEDQ  D:  05/12/2004  T:  05/12/2004  Job:  [id number]   cc:   Leatha Gilding. Mezer, M.D.  [address]  [place]  [place] [address]  Fax:  [phone]   Telford Nab, R.N.  [phone] [address]  [place], [place] [address]

## 2011-02-11 NOTE — Op Note (Signed)
NAMEANYIA, GIERKE               ACCOUNT NO.:  [id number]   MEDICAL RECORD NO.:  [id number]          PATIENT TYPE:  OIB   LOCATION:  6526                         FACILITY:  [facility]   PHYSICIAN:  Balinda Quails, M.D.    DATE OF BIRTH:  1951-01-09   DATE OF PROCEDURE:  04/01/2005  DATE OF DISCHARGE:                                 OPERATIVE REPORT   DIAGNOSIS:  Severe left common carotid ostial stenosis.   PROCEDURE:  1.  Arch aortogram.  2.  Selective left carotid arteriogram.  3.  Left common carotid stenting with 6 x 18 Genesis Aviator System.   ACCESS:  Right common femoral artery 6-French sheath.   COMPLICATIONS:  Small right groin hematoma.   CONTRAST:  85 mL Visipaque.   CLINICAL NOTE:  [name] is a 60 year old female with history of heavy  tobacco abuse. She recently was seen by Dr. Waverly Ferrari in the CVTS  office. Underwent Doppler evaluation revealing a severe left common carotid  artery stenosis. Diagnostic arteriography verified a severe stenosis of left  common carotid artery at the ostium. She was seen in consultation by myself,  and Dr. Delia Heady. It was recommended she undergo left carotid stenting  due to the anatomic nature of the lesion and the risk of potential stroke.  The patient consented.   PROCEDURE NOTE:  The patient brought to the cath lab in stable condition.  Placed in supine position. Right groin prepped and draped in a sterile  fashion. Skin and subcutaneous tissue instilled with 1% Xylocaine. A needle  was easily introduced with a single anterior wall stick into the right  common femoral artery. A 0.035 Wholey guidewire advanced through the needle  into the mid abdominal aorta. The needle removed, a 6-French sheath advanced  over the guidewire. The dilator removed, the sheath flushed with heparin  saline solution.   The patient then received a standard bolus of Angiomax intravenously per  protocol.   The ACT was measured at  367 seconds.   A standard pigtail catheter was then advanced over the guidewire. Over the  guidewire, a pigtail catheter advanced into the ascending aorta.   A 40-degree projection LAO arch aortogram obtained. This revealed a mild  stenosis in the innominate artery estimated to be less than 30%. Widely  patent right common carotid artery. The right subclavian artery revealed  tortuosity but was widely patent. Antegrade flow noted in the right for  tubal artery. The left common carotid artery revealed a high-grade ostial  stenosis estimated to be 90%. The remainder of left common carotid artery  was widely patent to the bifurcation. There was an ulcerated plaque at the  origin of left internal carotid artery. Ulcerated plaque was also noted at  the origin of the right internal carotid artery. The left subclavian artery  was widely patent. Antegrade left vertebral flow noted.   The pigtail catheter then exchanged over the [facility] guidewire. A 6-French JR-  4 guide catheter was then advanced over the guidewire. The JR-4 guide  catheter was engaged into the origin of left common carotid  artery. Left  carotid arteriography then obtained. Intracranial views were obtained  dictated under a separate heading. The cervical left common carotid views  did reveal a high-grade stenosis at the origin of the left common carotid  artery estimated to be 90%.   A 0.014 stabilizer guidewire was then advanced through the guide catheter  and across the left common carotid stenosis. A 6 x 18 Genesis stent on an  Aviator balloon was then advanced over the guidewire, positioned at the  origin of the left common carotid artery. Hand injection of contrast used to  verify appropriate position at the ostium of the left common carotid artery.  This was then inflated at 10 atmospheres for 15 seconds.   A completion arteriogram was then obtained. This revealed excellent  technical result. No evidence of significant  residual stenosis. The patient  remained neurologically intact throughout the procedure.   The guide catheter and guidewire were then removed.   The patient was noted have a small right groin hematoma, and pressure was  placed on the groin.   FINAL IMPRESSION:  1.  High-grade ostial left common carotid artery stenosis.  2.  Successful angioplasty and stenting of left ostial common carotid artery      stenosis with without apparent complication.       PGH/MEDQ  D:  04/01/2005  T:  04/01/2005  Job:  [id number]   cc:   Pramod P. Pearlean Brownie, MD  Fax: [phone]   [facility] Peripheral V[facility] Cath Lab

## 2011-02-11 NOTE — Discharge Summary (Signed)
[name], [name]               ACCOUNT NO.:  [id number]   MEDICAL RECORD NO.:  [id number]          PATIENT TYPE:  INP   LOCATION:  3704                         FACILITY:  [facility]   PHYSICIAN:  Meade Maw, M.D.    DATE OF BIRTH:  1951-09-26   DATE OF ADMISSION:  01/18/2006  DATE OF DISCHARGE:  01/19/2006                                 DISCHARGE SUMMARY   DISCHARGE DIAGNOSES:  1.  Chest pain thought to be non-cardiac.  2.  Mitral valve prolapse, SBE prophylaxis recommended.  3.  Hyperlipidemia, treated.  4.  Peripheral vascular disease.  5.  Bilateral carotid artery stenosis status post left carotid artery stent.  6.  Multiple medication uses.  7.  Penicillin causes pruritus.  8.  Hysterectomy 1997.  9.  Cholecystectomy 2000.  10. Family history of coronary artery disease.   HOSPITAL COURSE:  Ms. Wycoff is a 60 year old female patient of Dr. Meade Maw who was admitted on January 18, 2006 with substernal chest pain.  She  was monitored in the hospital for the next 24-36 hours.  Her cardiac iso  enzymes were negative.  Her EKG showed normal sinus rhythm, rate 91 with no  acute ST-T wave changes.  She underwent a stress Cardiolite on April 26,  2026for approximately 6 minutes.  No EKG changes were present.  Her nuclear  images showed no ischemia, no infarction.  Her EF was approximately 85%.  She was felt to be stable  for discharge to home.   LABORATORY STUDIES DURING HER HOSPITAL STAY:  TSH of 1.212.  Hemoglobin  15.4, hematocrit 45.1, white count 8.7, platelets 250.  BUN 14, creatinine  0.8, potassium 4.1, magnesium 2.4.  Portable chest x-ray showed no active  disease.   Patient was discharged home in stable condition on the following  medications.   DISCHARGE MEDICATIONS:  1.  Vytorin 10/40 one p.o. q.h.s.  2.  Enteric-coated aspirin 325 mg a day.  3.  Folic acid daily.  4.  Magnesium daily.  5.  Fish oil.  6.  Valium p.r.n.  7.  She was started on Protonix 40  mg daily.  8.  I have instructed her to use antibiotics for any teeth cleaning or any      invasive procedures/surgeries secondary to a history of mitral valve      prolapse.   She is to remain on a low fat diet, increase her activity slowly.  Call for  any further chest pain or questions and follow up with Dr. Fraser Din on Feb 03, 2006 at 10:45 a.m.      Guy Franco, P.A.      Meade Maw, M.D.  Electronically Signed    LB/MEDQ  D:  01/19/2006  T:  01/20/2006  Job:  [id number]   cc:   Molly Maduro L. Foy Guadalajara, M.D.  Fax: [phone]

## 2011-02-11 NOTE — Consult Note (Signed)
NAMEFARON, WHITELOCK NO.:  [id number]   MEDICAL RECORD NO.:  [id number]          PATIENT TYPE:  OBV   LOCATION:  5703                         FACILITY:  [facility]   PHYSICIAN:  Lacy Duverney, M.D.     DATE OF BIRTH:  [birth year]   DATE OF CONSULTATION:  DATE OF DISCHARGE:  12/28/2004                                   CONSULTATION   REASON FOR CONSULTATION:  Interpretation of intracranial views obtained  during carotid arteriogram performed by Dr. Durwin Nora on December 27, 2004.   CLINICAL HISTORY:  Patient with abnormal duplex examination.   RIGHT COMMON CAROTID ARTERIOGRAM INTRACRANIAL VIEWS (AP AND LATERAL):   With injection of the right common carotid artery, there is filling of the  left anterior cerebral artery.  No significant stenosis noted along the  right internal carotid artery or involving major branches of such.  No  aneurysm of vascular malformation detected.   IMPRESSION:  Evaluation of intracranial branches obtained during right  common carotid arteriogram does not reveal a high-grade stenosis of the  right internal carotid artery or major branches of such.  With injection of  the right common carotid artery there is filling of the left anterior  cerebral artery.      SO/MEDQ  D:  12/29/2004  T:  12/29/2004  Job:  [id number]   cc:   Mirian Mo  Radiology Dept

## 2011-02-11 NOTE — Consult Note (Signed)
NAMEFLOYCE, [name]                         ACCOUNT NO.:  [id number]   MEDICAL RECORD NO.:  [id number]                   PATIENT TYPE:  OUT   LOCATION:  GYN                                  FACILITY:  [facility]   PHYSICIAN:  De Blanch, M.D.         DATE OF BIRTH:  Jul 18, 1951   DATE OF CONSULTATION:  DATE OF DISCHARGE:                                   CONSULTATION   BRIEF HISTORY:  [age]-year-old white female returns for further  evaluation at the request of Dr. Teodora Medici.   I initially saw the patient in June 2004.  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  Biopsy on my initial visit showed  no evidence of dysplasia and we elected to follow the patient.  The patient  has been seen by Dr. Chevis Pretty in February and had essentially a normal exam.  However, on March 08, 2004 she underwent reevaluation with the colposcope and  white epithelium with some punctation was noted in the perineum.  A biopsy  showed moderate dysplasia.  Patient is essentially asymptomatic.   Patient has a past history of abnormal Pap smear treated with cryotherapy.  She is a smoker but is not otherwise immunocompromised.   PHYSICAL EXAMINATION:  VITAL SIGNS:  Weight 132 pounds, blood pressure  130/88.  GENERAL:  The patient is a healthy pleasant white female in no acute  distress.  ABDOMEN:  Soft and nontender.  No mass, organomegaly, ascites, or hernias  are noted.  PELVIC:  EGBUS, vagina, bladder, urethra appear grossly normal.  The vagina  is normal.  Cervix is normal.   Acetic acid is applied to the vulva and introitus and then colposcopy is  performed.  Using this technique I am able to see areas of white epithelium  across the perineal body and extending up both sides of the inner aspect of  labia minora, the remainder of the vulva appears normal.   IMPRESSION:  Recurrent moderate  dysplasia of the vulva, biopsy proven.   I would recommend that this area be treated with laser vaporization.  The  risks and benefits of laser vaporization and the followup care were  discussed with the patient.  I believe she would have a better cosmetic and  functional outcome using the laser than if we proceeded with wide local  excision and primary closure.  She is in agreement with the plan to go ahead  with laser vaporization.  She is planning a 21st birthday trip with her  daughter to [place], [place] in the near future and therefore we will  schedule outpatient laser surgery after she returns from her trip on May 12, 2004.   The risks of surgery including infection, pain, bleeding, and scarring were  discussed and she accepts these risks and all questions are answered.  De Blanch, M.D.    DC/MEDQ  D:  04/07/2004  T:  04/07/2004  Job:  [id number]   cc:   Leatha Gilding. Mezer, M.D.  [address]  [place]  [place] [address]  Fax: [phone]   Telford Nab, R.N.  [phone] [address]  [place], [place] [address]

## 2011-02-11 NOTE — Consult Note (Signed)
Laura [name], [name]               ACCOUNT NO.:  [id number]   MEDICAL RECORD NO.:  [id number]          PATIENT TYPE:  OUT   LOCATION:  GYN                          FACILITY:  [facility]   PHYSICIAN:  De Blanch, M.D.DATE OF BIRTH:  Feb 23, 1951   DATE OF CONSULTATION:  DATE OF DISCHARGE:                                   CONSULTATION   A 60 year old white female returns at the request of Laura [name]  regarding management of low-grade vulvar epithelial neoplasia.   The patient apparently saw Laura [name] for a routine 38-month followup in March  and at that time, using acetic acid, was found to have some white  epithelium, which was biopsied on both the right and left labia minora.  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  The patient reports that she is entirely asymptomatic.   HISTORY OF PRESENT ILLNESS:  The patient underwent laser vaporization for  severe dysplasia of the vulva in August, 2005.  She had a lesion  predominantly on the perineum, approximately 1 cm in diameter at that time.   She has a past history of cryotherapy of the cervix for dysplasia and claims  her Pap smears have been normal since that time.  She comes accompanied by  her daughter today.   PHYSICAL EXAMINATION:  VITAL SIGNS:  Weight 132 pounds.  ABDOMEN:  Soft and nontender.  No mass, organomegaly, ascites, or hernias  are noted.  PELVIC:  EG/BUS shows two healed biopsy sites on either side of the lower  inner labia minora.  The perineal site where the laser was performed appears  normal.  No gross lesions are noted.  The vagina is otherwise clean and well  supported.  The cervix is normal.   IMPRESSION:  Mild dysplasia of the vulva.   Given the fact that this is entirely asymptomatic, I would not recommend any  further therapy.   I had a lengthy counseling session with the patient and her daughter  regarding the natural history of this  disease and the evidence that is it  caused by papilloma virus, for which we have no direct treatment.  Given the  fact that the patient is asymptomatic and has no obvious lesion, I do not  have any indication that additional therapy would be of any benefit.  I have  recommended that the patient return to see Laura [name] and continue on visits  and office  examinations every six months.  I would not necessarily feel it required  that the patient have acetic acid applied but that the patient be carefully  inspected for any gross lesions.   The patient expressed an understanding of this recommendation and plan.      DC/MEDQ  D:  01/21/2005  T:  01/21/2005  Job:  [id number]   cc:   Laura [name], M.D.  [address]  [place]  [place] [address]  Fax: [phone]   Laura [name], R.N.  [phone] [address]  [place], [place] [address]

## 2011-02-11 NOTE — Op Note (Signed)
[name], [name]                         ACCOUNT NO.:  [id number]   MEDICAL RECORD NO.:  [id number]                   PATIENT TYPE:  AMB   LOCATION:  ENDO                                 FACILITY:  [facility]   PHYSICIAN:  Petra Kuba, M.D.                 DATE OF BIRTH:  Oct 28, 1950   DATE OF PROCEDURE:  08/19/2002  DATE OF DISCHARGE:                                 OPERATIVE REPORT   PROCEDURE:  Colonoscopy with biopsy and polypectomy.   INDICATIONS FOR PROCEDURE:  A patient with a normal barium enema, overdue  for colonic screening with some abdominal pain.  Consent was signed after  risks, benefits, methods, options thoroughly discussed in the office on  multiple occasions.   MEDICINES USED:  Versed 10 only.   DESCRIPTION OF PROCEDURE:  Rectum was inspected for external hemorrhoids,  small.  Digital exam was negative.  Video pediatric adjustable colonoscope  was inserted with some difficulty due to tortuous looping colon.  We were  able to advance to the cecum.  This did require rolling her on her back and  some abdominal pressure.  On insertion in the descending colon, a tiny polyp  was seen and was cold biopsied x 2.  No other abnormalities were seen as we  advanced to the cecum.  In fact, the scope was inserted a short way into the  terminal ileum which was normal.  Photo augmentation was obtained.  The  scope was slowly withdrawn.  On slow withdrawal through the colon, the  cecum, ascending, transverse were normal.  We could see the previous cold  biopsy site as we withdrew around the left side of the colon without obvious  residual polyp.  No additional findings were seen until we withdrew to the  distal sigmoid where a moderate size pedunculated polyp was seen and snared,  electrocautery applied, and the polyp was suctioned onto the head of the  scope, and the polyp was recovered.  The scope was re-inserted.  There  seemed to be a minimal amount of residual polyp at  the base which was  snared, electrocautery applied, and this part was suctioned through the  scope and collected in the trap.  Still there was possibly a touch of  residual adenomatous tissue, and one hot biopsy at the base was obtained as  well.  The scope was then further withdrawn.  No additional findings were  seen.  The scope was retroflexed revealing some internal hemorrhoids.  The  scope was straightened out with suction scope removed.  The patient  tolerated the procedure only fair.  There was no obvious immediate  complication.   ENDOSCOPIC DIAGNOSES:  1. Internal and external hemorrhoids.  2. Distal sigmoid moderate size polyp with two snares and one hot biopsy of     the base.  3. Questionable tiny descending polyp cold biopsied on insertion.  4. Otherwise within  normal limits to the terminal ileum.   PLAN:  Await pathology to determine future colonic screening.  Will go ahead  and again review her barium enema since, interestingly enough, I do not  think this polyp was seen on the barium enema.  We will probably use  Diprivan next based on her nausea and discomfort and difficulty using  narcotics based on previous nausea and vomiting.  I  will be happy to see her back p.r.n., otherwise return care to Dr. Chevis Pretty for  probable laparoscopy and lysis of adhesion as probable explanation of her  pain.  Could consider a one-time upper GI small-bowel follow-through if want  complete GI workup, but with negative CT scan, doubt significant findings  would be expected.                                               Petra Kuba, M.D.    MEM/MEDQ  D:  08/19/2002  T:  08/19/2002  Job:  [id number]   cc:   Leatha Gilding. Mezer, M.D.  [address]  [place]  [place] [address]  Fax: [phone]

## 2011-02-11 NOTE — Op Note (Signed)
[name], [name] NO.:  [id number]   MEDICAL RECORD NO.:  [id number]            PATIENT TYPE:   LOCATION:                                 FACILITY:   PHYSICIAN:  Balinda Quails, M.D.         DATE OF BIRTH:   DATE OF PROCEDURE:  06/23/2006  DATE OF DISCHARGE:                                 OPERATIVE REPORT   SURGEON:  Balinda Quails, M.D.   ASSISTANT:  Salvadore Farber, MD.   DIAGNOSIS:  Severe innominate artery stenosis.   PROCEDURE:  1. Innominate arteriogram.  2. Right subclavian arteriogram.  3. Innominate artery PTA/stent.   ACCESS:  Right common femoral artery 8-French sheath.   CONTRAST:  80 mL Visipaque.   COMPLICATIONS:  None apparent.   CLINICAL NOTE:  [name] is a 60 year old female with fairly aggressive  vascular disease.  She has previously undergone placement of a left common  carotid artery origin stent for severe stenosis.  She recently underwent  follow up arteriography revealing progression of mid innominate artery  stenosis, now estimated to be 80%.  She has noted fatigue in her right arm.  She has had some mild dizziness and unsteadiness.   She is brought to the cath lab at this time for scheduled innominate artery  stenting.  She has been on Plavix and aspirin pre procedure.   PROCEDURE NOTE:  The patient brought to the cath lab in stable condition.  Placed in the supine position.  Right groin prepped and draped in a sterile  fashion.  Skin and subcutaneous tissues instilled with 1% Xylocaine.  A  needle easily induced into the right common femoral artery.  A 0.035 Wholey  guidewire advanced through the needle into the mid abdominal aorta.  An 8-  [place] sheath advanced over guidewire.  The dilator removed and the sheath  flushed with heparin and saline solution.   A JR-4 catheter was then advanced over guidewire.  The catheter and  guidewire advanced up into the aortic arch.  The site of previous left  common carotid  artery origin stenting was easily identified.  This was used  as a landmark for the takeoff of the innominate artery.  The innominate  artery was easily engaged with the JR-4 catheter.  The Wholey guidewire  advanced out into the right subclavian artery.  The JR-4 catheter removed.  An 8-French JR-4 guide was then passed over the [facility] wire to the origin of  the innominate artery.  Using a coaxial technique, a 0.014 Mailman guidewire  was then advanced into the right subclavian and right brachial artery.   The patient administered 6000 units heparin intravenously.  ACT 280 seconds.  A Via tract 5 x 15 balloon was then advanced over the guidewire and inflated  at 6 atmospheres for 5 seconds.  The balloon deflated and removed.  A  Herculink stent 7 x 18 mm was then advanced over guidewire positioned at the  area of maximal stenosis in the innominate artery inflated to 10 atmospheres  x20 seconds.  The balloon  then drawn back across the origin of the stent and  inflated 10 atmospheres x5 seconds.   A completion arteriogram obtained.  The stent appeared to be under dilated.  Therefore, Agiltrac 8 x 20 balloon was advanced over the guidewire, inflated  within the stent at 8 atmospheres x5 seconds.  Following post deployment  dilatation a completion arteriogram obtained.  This revealed an excellent  technical result.  No evidence of dissection or thrombosis.  No apparent  complications.   The guidewire and guide catheter then removed.  The right femoral sheath was  left in place.  An ACT will be rechecked and the right femoral sheath  removed when appropriate.  Of note, the origin of the right subclavian  artery does reveal evidence of disease with approximately a 50% stenosis.   FINAL IMPRESSION:  1. Severe innominate artery stenosis.  2. 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  3. Successful PTA/stent innominate artery with minimal residual stenosis.   DISPOSITION:  These results  have been reviewed with the patient and family.  The patient will be discharged later today.  Followup in the office in  approximately 1 month.  To receive 6 weeks of Plavix.      Balinda Quails, M.D.  Electronically Signed     PGH/MEDQ  D:  06/23/2006  T:  06/25/2006  Job:  [id number]   cc:   [facility] Katrina Stack, NP

## 2011-02-11 NOTE — Op Note (Signed)
NAMELORELI, DEBRULER               ACCOUNT NO.:  [id number]   MEDICAL RECORD NO.:  [id number]          PATIENT TYPE:  AMB   LOCATION:  SDS                          FACILITY:  [facility]   PHYSICIAN:  Balinda Quails, M.D.    DATE OF BIRTH:  [birth year]   DATE OF PROCEDURE:  06/23/2006  DATE OF DISCHARGE:  06/23/2006                               OPERATIVE REPORT   SURGEON:  Balinda Quails, MD.   DIAGNOSIS:  High-grade innominate artery stenosis.   PROCEDURE:  1. Innominate arteriogram.  2. Right subclavian arteriogram.  3. Innominate angioplasty and stenting.   ACCESS:  Right common femoral artery 8-French sheath.   CONTRAST:  80 mL Visipaque.   COMPLICATIONS:  None apparent   CLINICAL NOTE:  [name] is a 60 year old female with known  atherosclerotic aortic arch disease.  She has previously undergone  placement of a left common carotid origin stent for high-grade stenosis.  She is brought back to the cath lab, at this time, due to recent  arteriography revealing a high-grade progressive innominate artery  stenosis.  She has received preoperative aspirin and Plavix.  Risks of  the procedure including the potential risks of a cerebrovascular event  have been discussed with the patient in detail.   PROCEDURE NOTE:  The patient brought to the cath lab in stable  condition.  Placed in the supine position.  Right groin prepped and  draped in a sterile fashion.  Skin and subcutaneous tissues were  instilled with 1% Xylocaine.  A needle was easily introduced into the  right common femoral artery.  Then a 0.035 Wholey guidewire advanced  through the needle into the mid abdominal aorta.  The needle removed and  an 8-French sheath advanced over the guidewire.  The dilator removed and  sheath flushed with heparin saline solution.   The patient was then administered 6000 units of heparin intravenously.  ACT measured 287 seconds.   A 6-French expo guide catheter was then advanced over  the guidewire and  both were advanced up into the aortic arch.  The catheter engaged into  the innominate artery origin.  The innominate arteriography obtained  with hand injection of contrast.  This revealed a high-grade innominate  artery stenosis estimated to be 80%.  The Wholey guidewire was then  advanced across the stenosis out into the right subclavian artery.  A  predilatation was then carried out with a via track +5 x 15 balloon at 6  atmospheres for 5 seconds.   The balloon then removed.  A Herculink stent 7 x 18 was then advanced  over the guidewire, and positioned appropriately at the innominate  stenosis, and inflated at 10 atmospheres for 20 seconds.  A post  deployment arteriogram revealed under dilatation of the stent.  Therefore, a Agiltrac balloon was advanced over the guidewire; and  inflated at 8 atmospheres for 5 seconds.  This was an 8 x 20 balloon.  The balloon then removed.   A completion arteriogram obtained.  This revealed an excellent technical  result.  There was no evidence of complication.  The patient remained  neurologically intact.  There was no evidence of dissection at the  angioplasty and stenting site.   The guidewire and guide catheter were then removed.  There were no  apparent complications.  The sheath left in place.  The patient  transferred to holding area.   FINAL IMPRESSION:  1. A high-grade progress innominate artery origin stenosis.  2. Successful angioplasty and stenting of innominate artery stenosis      with minimal residual stenosis and no apparent complications.   DISPOSITION:  The patient will be followed up in the office, and will  remain on Plavix at this time.      Balinda Quails, M.D.  Electronically Signed     PGH/MEDQ  D:  11/16/2006  T:  11/17/2006  Job:  [id number]

## 2011-02-11 NOTE — H&P (Signed)
[name], [name]               ACCOUNT NO.:  [id number]   MEDICAL RECORD NO.:  [id number]          PATIENT TYPE:  OIB   LOCATION:  NA                           FACILITY:  [facility]   PHYSICIAN:  Balinda Quails, M.D.    DATE OF BIRTH:  1951/06/09   DATE OF ADMISSION:  04/01/2005  DATE OF DISCHARGE:                                HISTORY & PHYSICAL   PRIMARY PHYSICIAN:  Joycelyn Rua, M.D.   CHIEF COMPLAINT:  Dizziness.   HISTORY OF PRESENT ILLNESS:  [name] is a 60 year old Caucasian female  first seen by Dr. Waverly Ferrari on June 23, 2004, for right  carotid artery stenosis, 60-79%, which was found after evaluation for right  carotid bruit. She was asymptomatic at that time other than dizziness, which  had improved after starting Valium. He recommended six-month followup. On  December 22, 2004, repeat carotid duplex showed progression of her right  carotid artery stenosis to 80%. A tight greater than 80% left common carotid  artery stenosis, which was fairly far proximally, was also noted. Her  vertebral artery flow was normally directed. She remained asymptomatic. At  that point, Dr. Edilia Bo was more concerned with the new finding of left  common carotid artery stenosis. He recommended arteriogram, which was  performed on December 27, 2004, which showed ulcerated plaque at the right  carotid bifurcation producing an approximately 50% stenosis, proximal left  common carotid artery stenosis of 85-90% and mild left subclavian artery  stenosis with no evidence of subclavian steal. A high-grade left common  carotid artery lesion was felt potentially very difficult to treat by  surgical means. Therefore, possible treatment with carotid artery stenting  was recommended. For this procedure, she was referred to Dr. Adele Dan  partner, Dr. Balinda Quails. Dr. Madilyn Fireman met with [name] on Feb 07, 2005.  After discussing the risks and benefits with Mr. Kishbaugh, she agreed to  proceed.  Prior to scheduling, however, she was set up for preintervention  consultation with Dr. Pearlean Brownie at [facility]. Following  consultation, she was scheduled for left common carotid artery stenting on  April 01, 2005, with plans for Plavix and baby aspirin one week prior to the  procedure. Again, [name] has been asymptomatic. She denies headache,  numbness, tingling, muscle weakness, dysarthria, dysphasia, visual changes  or confusion. She has had intermittent nausea, which she relates primarily  to nerves. She also still has some mild intermittent dizziness, but again  this has improved since starting on Valium.   PAST MEDICAL HISTORY:  1.  Carotid artery disease as discussed above.  2.  Hyperlipidemia.  3.  Ectopic pregnancy.   PAST SURGICAL HISTORY:  1.  Cholecystectomy in 2000.  2.  Hysterectomy in 1997.  3.  Cesarean section in 1984.  4.  Right inguinal hernia surgery x 3 in 1992.   ALLERGIES:  She develops welts and a rash to PENICILLIN. She also reports  some GI upset with all NARCOTIC PAIN MEDICATIONS. (She has done well with  Ultram in the past.)   MEDICATIONS:  1.  Vytorin  10/40 mg p.o. daily.  2.  Plavix 75 mg p.o. daily.  3.  Aspirin 81 mg p.o. daily.  4.  Valium 2 mg p.o. daily.   REVIEW OF SYSTEMS:  Please refer the history of present illness for  pertinent positives and negatives. In addition, she does report occasional  mild dyspnea on exertion. She denies hematochezia, nausea, constipation and  diarrhea.   SOCIAL HISTORY:  She is married with one child. She used to smoke about one  pack of cigarettes per day for the past 20 years, but quit in on Jan 24, 2005. She denies any alcohol use. 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   FAMILY HISTORY:  Her mother died at age 88 from coronary artery disease. Her  paternal grandfather died at age 41 from  coronary artery disease.   PHYSICAL EXAMINATION:  VITAL SIGNS:  Blood pressure 118/80, heart rate 80,  respirations 20.  GENERAL APPEARANCE:  This is a 60 year old Caucasian female who is alert,  cooperative and in no acute distress. She is somewhat tearful and anxious  regarding her on future carotid stenting.  HEENT:  Her head is normocephalic and atraumatic. Her pupils equal, round  and reactive to light and accommodation. Her sclerae are nonicteric. Her  oral mucous membranes are pink and moist. No lesions or erythema are noted.  NECK:  Her neck is supple. No thyromegaly or lymphadenopathy is noted. She  has palpable carotid pulses with a soft carotid bruit auscultated on the  right.  RESPIRATORY:  Her lungs sounds are clear throughout. Breathing is unlabored  and symmetrical on inspiration.  CARDIAC:  Her heart has a regular rate and rhythm. No murmur, rub or gallop  is noted.  ABDOMEN:  Her abdomen is soft, nontender and nondistended with normoactive  bowel sounds. No masses or hepatosplenomegaly were noted.  GENITOURINARY/RECTAL:  These exams were deferred.  EXTREMITIES:  Her extremities show trace pedal edema. Her extremities are  warm, but the temperature in her toes is slightly decreased. She has 2+  radial pulses bilaterally and 1+ posterior tibial and dorsalis pedis pulses  bilaterally. There are no skin ulcerations noted.  NEUROLOGIC:  Her neurologic exam is grossly intact. She is alert and  oriented x 4. Her gait is steady. Her speech is clear. Her muscle strength  is 5/5 in her upper and lower extremities and grossly symmetrical.   ASSESSMENT:  Asymptomatic 85-90% left common carotid artery stenosis.   PLAN:  [name] will be electively admitted to [facility] to  undergo a left common carotid artery stenting by Dr. Denman George on  April 01, 2005.     AWZ/MEDQ  D:  03/30/2005  T:  03/30/2005  Job:  [id number]

## 2011-02-11 NOTE — Consult Note (Signed)
NAMEVICY, MEDICO               ACCOUNT NO.:  [id number]   MEDICAL RECORD NO.:  [id number]          PATIENT TYPE:  OUT   LOCATION:  GYN                          FACILITY:  [facility]   PHYSICIAN:  John T. Kyla Balzarine, M.D.    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   INTERVAL HISTORY:  The patient underwent laser vaporization of the vulva on  May 12, 2004, predominantly  across the perineum.  She complains of  pruritus and a feeling like yeast with irritation of the perineum.  She is  still using Silvadene cream.   Past history, personal and social history, family history, and review of  systems are otherwise unchanged from those recorded by Dr. Stanford Breed  during intake evaluation in June 2004.  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   PHYSICAL EXAMINATION:  VITAL SIGNS:  Weight 132 pounds, blood pressure  114/80.  Examination of the operative site reveals complete epithelization.  There has been new skin across the perineum without fissures or  condylomatous lesions.  The vagina is briefly examined and clear.   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   PLAN:  I discussed the rinse/blot/blow dry regimen with the patient and  would recommend that she use this b.i.d.  She could supplement this with 1%  hydrocortisone lotion.  She should follow up with Dr. Chevis Pretty at 6 month  intervals, but we would be glad to see her back here on a p.r.n. basis.     John   JTS/MEDQ  D:  06/16/2004  T:  06/16/2004  Job:  [id number]   cc:   Leatha Gilding. Mezer, M.D.  [address]  [place]  [place] [address]  Fax: [phone]   Telford Nab, R.N.  [phone] [address]  [place], [place] [address]

## 2011-07-05 LAB — COMPREHENSIVE METABOLIC PANEL
ALT: 23
AST: 26
Albumin: 3.8
CO2: 26
CO2: 28
Calcium: 9.2
Chloride: 105
Creatinine, Ser: 0.66
Creatinine, Ser: 0.65
GFR calc Af Amer: >60
GFR calc non Af Amer: >60
Glucose, Bld: 108 — ABNORMAL HIGH
Glucose, Bld: 101 — ABNORMAL HIGH
Sodium: 139
Sodium: 139
Total Bilirubin: 1.5 — ABNORMAL HIGH
Total Protein: 6.2
Total Protein: 6.3

## 2011-07-05 LAB — APTT: aPTT: 27

## 2011-07-05 LAB — URINALYSIS, ROUTINE W REFLEX MICROSCOPIC
Glucose, UA: NEGATIVE
Specific Gravity, Urine: 1.012

## 2011-07-05 LAB — CBC
HCT: 32.8 — ABNORMAL LOW
HCT: 39
Hemoglobin: 11.1 — ABNORMAL LOW
Hemoglobin: 13.2
MCHC: 33.7
MCHC: 33.9
MCHC: 33.8
MCV: 92.3
MCV: 91.2
Platelets: 292
RBC: 3.58 — ABNORMAL LOW
RDW: 12.7
RDW: 13.2
RDW: 13.2

## 2011-07-05 LAB — ABO/RH: ABO/RH(D): O POS

## 2011-07-05 LAB — BASIC METABOLIC PANEL
BUN: 3 — ABNORMAL LOW
CO2: 24
Calcium: 8.5
Creatinine, Ser: 0.66
GFR calc Af Amer: >60
GFR calc non Af Amer: >60
Glucose, Bld: 134 — ABNORMAL HIGH
Potassium: 3.2 — ABNORMAL LOW

## 2011-07-05 LAB — PROTIME-INR
INR: 0.9
Prothrombin Time: 12.5
Prothrombin Time: 12.2

## 2011-07-05 LAB — TYPE AND SCREEN: Antibody Screen: NEGATIVE

## 2011-08-05 ENCOUNTER — Other Ambulatory Visit (INDEPENDENT_AMBULATORY_CARE_PROVIDER_SITE_OTHER): Payer: PRIVATE HEALTH INSURANCE

## 2011-08-05 DIAGNOSIS — Z48812 Encounter for surgical aftercare following surgery on the circulatory system: Secondary | ICD-10-CM

## 2011-08-05 DIAGNOSIS — I6529 Occlusion and stenosis of unspecified carotid artery: Secondary | ICD-10-CM

## 2011-08-09 ENCOUNTER — Encounter: Payer: Self-pay | Admitting: Surgery

## 2011-08-09 NOTE — Procedures (Unsigned)
CAROTID DUPLEX EXAM  INDICATION:  Carotid artery stenosis.  HISTORY: Diabetes:  No. Cardiac:  No. Hypertension:  Yes. Smoking:  Previously. Previous Surgery: Right carotid endarterectomy on 08/21/2007, innominate artery stent placed on 06/23/2006, left common carotid artery stent placed on 03/31/2005. CV History: Amaurosis Fugax No, Paresthesias No, Hemiparesis No.                                      RIGHT             LEFT Brachial systolic pressure:         134               142 Brachial Doppler waveforms:         Triphasic         Triphasic Vertebral direction of flow:        Antegrade         Antegrade DUPLEX VELOCITIES (cm/sec) CCA peak systolic                   129               131 ECA peak systolic                   255               142 ICA peak systolic                   112               133 ICA end diastolic                   20                43 PLAQUE MORPHOLOGY:                  Mixed             Mixed PLAQUE AMOUNT:                      Mild              Mild-to-moderate PLAQUE LOCATION:                    ECA               CCA, ICA, ECA.  DUPLEX:  Turbulent flow is noted in the proximal right common carotid artery.  The right innominate stent is patent with velocities as high as 216 cm/s.  The left common carotid artery stent is patent with velocities of 257 cm/s.  IMPRESSION: 1. Right internal carotid artery is within normal limits, status post     endarterectomy. 2. 40% to 59% left internal carotid artery stenosis. 3. Patent innominate artery stent with stable velocities. 4. Patent left common carotid artery stent with stable velocities.  ___________________________________________ V. Charlena Cross, MD  CI/MEDQ  D:  08/05/2011  T:  08/05/2011  Job:  [id number]

## 2011-08-12 ENCOUNTER — Other Ambulatory Visit: Payer: Self-pay | Admitting: Vascular Surgery

## 2011-08-12 DIAGNOSIS — I6529 Occlusion and stenosis of unspecified carotid artery: Secondary | ICD-10-CM

## 2011-08-12 DIAGNOSIS — Z48812 Encounter for surgical aftercare following surgery on the circulatory system: Secondary | ICD-10-CM

## 2012-01-25 HISTORY — PX: CARDIAC CATHETERIZATION: SHX172

## 2012-02-06 ENCOUNTER — Other Ambulatory Visit: Payer: Self-pay | Admitting: Family Medicine

## 2012-02-06 DIAGNOSIS — Z1231 Encounter for screening mammogram for malignant neoplasm of breast: Secondary | ICD-10-CM

## 2012-02-07 ENCOUNTER — Other Ambulatory Visit: Payer: Self-pay | Admitting: Interventional Cardiology

## 2012-02-08 ENCOUNTER — Encounter (HOSPITAL_BASED_OUTPATIENT_CLINIC_OR_DEPARTMENT_OTHER)
Admission: RE | Disposition: A | Discharge status: Home / Self Care | Payer: Self-pay | Source: Ambulatory Visit | Attending: Interventional Cardiology

## 2012-02-08 ENCOUNTER — Inpatient Hospital Stay (HOSPITAL_BASED_OUTPATIENT_CLINIC_OR_DEPARTMENT_OTHER)
Admission: RE | Admit: 2012-02-08 | Discharge: 2012-02-08 | Disposition: A | Discharge status: Home / Self Care | Payer: BC Managed Care – PPO | Source: Ambulatory Visit | Attending: Interventional Cardiology | Admitting: Interventional Cardiology

## 2012-02-08 DIAGNOSIS — I77819 Aortic ectasia, unspecified site: Secondary | ICD-10-CM | POA: Insufficient documentation

## 2012-02-08 DIAGNOSIS — I2510 Atherosclerotic heart disease of native coronary artery without angina pectoris: Secondary | ICD-10-CM | POA: Insufficient documentation

## 2012-02-08 DIAGNOSIS — I209 Angina pectoris, unspecified: Secondary | ICD-10-CM | POA: Insufficient documentation

## 2012-02-08 SURGERY — JV LEFT HEART CATHETERIZATION WITH CORONARY ANGIOGRAM
Anesthesia: Moderate Sedation

## 2012-02-08 MED ORDER — ACETAMINOPHEN 325 MG PO TABS: 650.0000 mg | ORAL_TABLET | ORAL | Status: DC | PRN

## 2012-02-08 MED ORDER — ONDANSETRON HCL 4 MG/2ML IJ SOLN: 4.0000 mg | Freq: Four times a day (QID) | INTRAMUSCULAR | Status: DC | PRN

## 2012-02-08 MED ORDER — SODIUM CHLORIDE 0.9 % IV SOLN: 1.0000 mL/kg/h | INTRAVENOUS | Status: DC

## 2012-02-08 MED ORDER — SODIUM CHLORIDE 0.9 % IV SOLN: INTRAVENOUS | Status: DC

## 2012-02-08 NOTE — OR Nursing (Signed)
Meal served 

## 2012-02-08 NOTE — OR Nursing (Signed)
Discharge instructions reviewed and signed, pt stated understanding, ambulated in hall without difficulty, site level 0, transported to husband's car via wheelchair 

## 2012-02-08 NOTE — H&P (Signed)
Date of Initial H&P: 02/07/12  History reviewed, patient examined, no change in status, stable for surgery.

## 2012-02-08 NOTE — OR Nursing (Signed)
Tegaderm dressing applied, site level 0, bedrest begins at 1020 

## 2012-02-08 NOTE — OR Nursing (Signed)
Dr Varanasi at bedside to discuss results and treatment plan with pt and family 

## 2012-02-08 NOTE — CV Procedure (Signed)
PROCEDURE:  Left heart catheterization with selective coronary angiography, left ventriculogram.  Aortic arch aortogram.  Abdominal aortogram.  INDICATIONS:  Angina  The risks, benefits, and details of the procedure were explained to the patient.  The patient verbalized understanding and wanted to proceed.  Informed written consent was obtained.  PROCEDURE TECHNIQUE:  After Xylocaine anesthesia a 55F sheath was placed in the right femoral artery with a single anterior needle wall stick.   Left coronary angiography was done using a Judkins L4 guide catheter.  Right coronary angiography was done using a Judkins R4 guide catheter.  Left ventriculography was done using a pigtail catheter. Pigtail was withdrawn and aortic root and abdominal aortogram were performed with power injections of contrast.   CONTRAST:  Total of 100 cc.  COMPLICATIONS:  None.    HEMODYNAMICS:  Aortic pressure was 125/60; LV pressure was 128/9; LVEDP 21.  There was no gradient between the left ventricle and aorta.    ANGIOGRAPHIC DATA:   The left main coronary artery is widely patent.  The left anterior descending artery is a large vessel proximally.  The artery appears angiographically normal.  There is a medium-sized first diagonal which appears widely patent.  The left circumflex artery is a medium-sized vessel.  There is a small OM 1 and OM 2 both of which are patent.  The OM 3 is small and widely patent.  There is a medium-sized ramus vessel that appears angiographically normal.  The right coronary artery is a large dominant vessel.  There is mild diffuse calcification noted on fluoroscopy.  There is no significant atherosclerosis noted.  The posterior descending artery is widely patent.  The posterior lateral artery is a large vessel with several branches, all of which are widely patent.  LEFT VENTRICULOGRAM:  Left ventricular angiogram was done in the 30 RAO projection and revealed normal left ventricular wall motion  and systolic function with an estimated ejection fraction of 60 %.  LVEDP was 21 mmHg.  ASCENDING AORTIC ANGIOGRAM: The stents in the innominate artery appears widely patent.  There is also a stent in the left common carotid ostium.  The stent in the common carotid is not as well visualized with angiography.  There appears to be normal filling in the non-stented portion of the common carotid artery.  ABDOMINAL AORTOGRAM: The superior mesenteric artery appears widely patent.  There appeared to be single renal arteries bilaterally.  Both appear to be patent.  There is some mild ectasia in the infrarenal aorta.  There is mild atherosclerosis in the infrarenal aorta.  IMPRESSIONS:  1. Minimal coronary calcification.  No significant coronary atherosclerosis. 2. Normal left ventricular systolic function.  LVEDP 21 mmHg.  Ejection fraction 60%. 3.   Patent innominate artery stent. 4.    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  RECOMMENDATION:  Discomfort in her chest does not appear to be coming from her heart.  Continue aggressive secondary prevention.

## 2012-02-16 ENCOUNTER — Ambulatory Visit
Admission: RE | Admit: 2012-02-16 | Discharge: 2012-02-16 | Disposition: A | Discharge status: Home / Self Care | Payer: BC Managed Care – PPO | Source: Ambulatory Visit | Attending: Family Medicine | Admitting: Family Medicine

## 2012-02-16 DIAGNOSIS — Z1231 Encounter for screening mammogram for malignant neoplasm of breast: Secondary | ICD-10-CM

## 2012-08-03 ENCOUNTER — Encounter: Payer: Self-pay | Admitting: Surgery

## 2012-08-06 ENCOUNTER — Ambulatory Visit (INDEPENDENT_AMBULATORY_CARE_PROVIDER_SITE_OTHER): Payer: BC Managed Care – PPO | Admitting: Surgery

## 2012-08-06 ENCOUNTER — Ambulatory Visit (INDEPENDENT_AMBULATORY_CARE_PROVIDER_SITE_OTHER): Payer: BC Managed Care – PPO | Admitting: Vascular Surgery

## 2012-08-06 ENCOUNTER — Encounter: Payer: Self-pay | Admitting: Surgery

## 2012-08-06 VITALS — BP 138/65 | HR 84 | Resp 16 | Ht 65.0 in | Wt 138.9 lb

## 2012-08-06 DIAGNOSIS — I6529 Occlusion and stenosis of unspecified carotid artery: Secondary | ICD-10-CM

## 2012-08-06 DIAGNOSIS — I779 Disorder of arteries and arterioles, unspecified: Secondary | ICD-10-CM | POA: Insufficient documentation

## 2012-08-06 DIAGNOSIS — I771 Stricture of artery: Secondary | ICD-10-CM

## 2012-08-06 DIAGNOSIS — Z48812 Encounter for surgical aftercare following surgery on the circulatory system: Secondary | ICD-10-CM

## 2012-08-06 NOTE — Progress Notes (Signed)
[facility]ist [facility]   Patient name: [name] MRN: [id number] DOB: 11-20-1950 Sex: female     Chief Complaint  Patient presents with  . Carotid    1 yr f/u     HISTORY OF PRESENT ILLNESS: The patient is back today for followup. She has a history of a right carotid endarterectomy on 08/21/2007. She has had a innominate stent placed on 06/23/2006, and a left common carotid artery stent on 03/31/2005. She continues to be asymptomatic. Specifically, she denies numbness or weakness in either extremity. She denies slurred speech. She denies amaurosis fugax. She denies memory problems.  Patient continues to be managed medically for hypertension and hypercholesterolemia. She does not smoke. She is on antiplatelet therapy with aspirin and Plavix.  Past Medical History  Diagnosis Date  . Carotid artery occlusion   . Hyperlipidemia   . Mitral valve prolapse   . Hypertension     Past Surgical History  Procedure Date  . Carotid endarterectomy 08/21/2007    right - Dr. Madilyn Fireman  . Abdominal hysterectomy 1997  . Cholecystectomy 2000  . Innominate stent     History   Social History  . Marital Status: Married    Spouse Name: N/A    Number of Children: N/A  . Years of Education: N/A   Occupational History  . Not on file.   Social History Main Topics  . Smoking status: Former Smoker    Types: Cigarettes    Quit date: 08/06/2002  . Smokeless tobacco: Never Used  . Alcohol Use: No  . Drug Use: No  . Sexually Active: Not on file   Other Topics Concern  . Not on file   Social History Narrative  . No narrative on file    Family History  Problem Relation Age of Onset  . Heart attack Mother     Allergies as of 08/06/2012 - Review Complete 08/06/2012  Allergen Reaction Noted  . Ciprocin-fluocin-procin (fluocinolone) Nausea And Vomiting 02/08/2012  . Demerol (meperidine) Nausea And Vomiting 02/08/2012  . Fentanyl Nausea And Vomiting 02/08/2012  .  Penicillins Hives 02/08/2012    Current Outpatient Prescriptions on File Prior to Visit  Medication Sig Dispense Refill  . atorvastatin (LIPITOR) 40 MG tablet Take 40 mg by mouth daily.      Marland Kitchen lisinopril (PRINIVIL,ZESTRIL) 5 MG tablet Take 5 mg by mouth daily.      . pantoprazole (PROTONIX) 40 MG tablet Take 2 tablets by mouth daily.         REVIEW OF SYSTEMS: Cardiovascular: No chest pain, chest pressure, palpitations, orthopnea, or dyspnea on exertion. Occasional cramping with activity but not lifestyle limiting. Pulmonary: No productive cough, asthma or wheezing. Neurologic: No weakness, paresthesias, aphasia, or amaurosis. No dizziness. Hematologic: No bleeding problems or clotting disorders. Musculoskeletal: No joint pain or joint swelling. Gastrointestinal: No blood in stool or hematemesis Genitourinary: No dysuria or hematuria. Psychiatric:: No history of major depression. Integumentary: No rashes or ulcers. Constitutional: No fever or chills.  PHYSICAL EXAMINATION:   Vital signs are BP 138/65  Pulse 84  Resp 16  Ht 5\' 5"  (1.651 m)  Wt 138 lb 14.4 oz (63.005 kg)  BMI 23.11 kg/m2  SpO2 100% General: The patient appears their stated age. HEENT:  No gross abnormalities Pulmonary:  Non labored breathing Abdomen: Soft and non-tender Musculoskeletal: There are no major deformities. Neurologic: No focal weakness or paresthesias are detected, Skin: There are no ulcer or rashes noted. Psychiatric: The patient has normal  affect. Cardiovascular: There is a regular rate and rhythm without significant murmur appreciated. Bruit over the right supraclavicular area and infraclavicular area no left carotid bruit   Diagnostic Studies Carotid ultrasound today shows a widely patent right carotid endarterectomy site. The innominate stent is not visualized. The left common carotid artery at the proximal segments show elevated velocities with peak systolic velocity of 227 cm/s. Left  carotid stenosis is in the 40-59% range. This study is essentially unchanged from 2012.  Assessment: I lateral carotid disease, asymptomatic Plan: Since I hear a bruit over the right chest, and the patient has a right innominate artery stent placed in 2006 which has not been reevaluated, and it was not seen on ultrasound today, I feel that we need to proceed with CT angiogram to better evaluate the stent. In addition ultrasound did not visualize the proximal left common carotid artery very well and so I will also get a CT angiogram of the neck to further evaluate this. I will contact the patient with results. She will come back and see me in one year or sooner if her CT scan shows abnormalities.  Jorge Ny, M.D. [facility] [place] Office: [phone] Pager:  [phone]

## 2012-08-06 NOTE — Progress Notes (Signed)
Carotid duplex performed @ [facility] 08/06/2012 

## 2012-08-06 NOTE — Addendum Note (Signed)
Addended by: Sharee Pimple on: 08/06/2012 01:02 PM   Modules accepted: Orders

## 2012-08-13 ENCOUNTER — Other Ambulatory Visit: Payer: BC Managed Care – PPO

## 2012-08-17 ENCOUNTER — Ambulatory Visit
Admission: RE | Admit: 2012-08-17 | Discharge: 2012-08-17 | Disposition: A | Discharge status: Home / Self Care | Payer: BC Managed Care – PPO | Source: Ambulatory Visit | Attending: Surgery | Admitting: Surgery

## 2012-08-17 DIAGNOSIS — Z48812 Encounter for surgical aftercare following surgery on the circulatory system: Secondary | ICD-10-CM

## 2012-08-17 DIAGNOSIS — I771 Stricture of artery: Secondary | ICD-10-CM

## 2012-08-17 DIAGNOSIS — I6529 Occlusion and stenosis of unspecified carotid artery: Secondary | ICD-10-CM

## 2012-08-17 MED ORDER — IOHEXOL 350 MG/ML SOLN: 80.0000 mL | Freq: Once | INTRAVENOUS | Status: AC | PRN

## 2012-08-17 MED ORDER — IOHEXOL 350 MG/ML SOLN
80.0000 mL | Freq: Once | INTRAVENOUS | Status: AC | PRN
  Administered 2012-08-17: 80 mL via INTRAVENOUS

## 2012-08-20 ENCOUNTER — Ambulatory Visit: Payer: BC Managed Care – PPO | Admitting: Surgery

## 2013-05-03 ENCOUNTER — Other Ambulatory Visit: Payer: Self-pay

## 2013-05-03 DIAGNOSIS — Z1231 Encounter for screening mammogram for malignant neoplasm of breast: Secondary | ICD-10-CM

## 2013-05-21 ENCOUNTER — Ambulatory Visit
Admission: RE | Admit: 2013-05-21 | Discharge: 2013-05-21 | Disposition: A | Discharge status: Home / Self Care | Payer: BC Managed Care – PPO | Source: Ambulatory Visit

## 2013-05-21 DIAGNOSIS — Z1231 Encounter for screening mammogram for malignant neoplasm of breast: Secondary | ICD-10-CM

## 2013-08-12 ENCOUNTER — Ambulatory Visit: Payer: BC Managed Care – PPO | Admitting: Family

## 2013-08-12 ENCOUNTER — Other Ambulatory Visit (HOSPITAL_COMMUNITY): Payer: BC Managed Care – PPO

## 2013-09-12 ENCOUNTER — Encounter: Payer: Self-pay | Admitting: Family

## 2013-09-13 ENCOUNTER — Encounter (INDEPENDENT_AMBULATORY_CARE_PROVIDER_SITE_OTHER): Payer: Self-pay

## 2013-09-13 ENCOUNTER — Ambulatory Visit: Payer: BC Managed Care – PPO | Admitting: Family

## 2013-09-13 ENCOUNTER — Ambulatory Visit (HOSPITAL_COMMUNITY)
Admission: RE | Admit: 2013-09-13 | Discharge: 2013-09-13 | Disposition: A | Discharge status: Home / Self Care | Payer: Self-pay | Source: Ambulatory Visit | Attending: Family | Admitting: Family

## 2013-09-13 DIAGNOSIS — Z48812 Encounter for surgical aftercare following surgery on the circulatory system: Secondary | ICD-10-CM

## 2013-09-13 DIAGNOSIS — I658 Occlusion and stenosis of other precerebral arteries: Secondary | ICD-10-CM | POA: Insufficient documentation

## 2013-09-13 DIAGNOSIS — I6529 Occlusion and stenosis of unspecified carotid artery: Secondary | ICD-10-CM | POA: Insufficient documentation

## 2013-09-13 NOTE — Patient Instructions (Signed)
Stroke Prevention Some medical conditions and behaviors are associated with an increased chance of having a stroke. You may prevent a stroke by making healthy choices and managing medical conditions. Reduce your risk of having a stroke by:  Staying physically active. Get at least 30 minutes of activity on most or all days.  Not smoking. It may also be helpful to avoid exposure to secondhand smoke.  Limiting alcohol use. Moderate alcohol use is considered to be:  No more than 2 drinks per day for men.  No more than 1 drink per day for nonpregnant women.  Eating healthy foods.  Include 5 or more servings of fruits and vegetables a day.  Certain diets may be prescribed to address high blood pressure, high cholesterol, diabetes, or obesity.  Managing your cholesterol levels.  A low-saturated fat, low-trans fat, low-cholesterol, and high-fiber diet may control cholesterol levels.  Take any prescribed medicines to control cholesterol as directed by your caregiver.  Managing your diabetes.  A controlled-carbohydrate, controlled-sugar diet is recommended to manage diabetes.  Take any prescribed medicines to control diabetes as directed by your caregiver.  Controlling your high blood pressure (hypertension).  A low-salt (sodium), low-saturated fat, low-trans fat, and low-cholesterol diet is recommended to manage high blood pressure.  Take any prescribed medicines to control hypertension as directed by your caregiver.  Maintaining a healthy weight.  A reduced-calorie, low-sodium, low-saturated fat, low-trans fat, low-cholesterol diet is recommended to manage weight.  Stopping drug abuse.  Avoiding birth control pills.  Talk to your caregiver about the risks of taking birth control pills if you are over 35 years old, smoke, get migraines, or have ever had a blood clot.  Getting evaluated for sleep disorders (sleep apnea).  Talk to your caregiver about getting a sleep evaluation  if you snore a lot or have excessive sleepiness.  Taking medicines as directed by your caregiver.  For some people, aspirin or blood thinners (anticoagulants) are helpful in reducing the risk of forming abnormal blood clots that can lead to stroke. If you have the irregular heart rhythm of atrial fibrillation, you should be on a blood thinner unless there is a good reason you cannot take them.  Understand all your medicine instructions. SEEK IMMEDIATE MEDICAL CARE IF:   You have sudden weakness or numbness of the face, arm, or leg, especially on one side of the body.  You have sudden confusion.  You have trouble speaking (aphasia) or understanding.  You have sudden trouble seeing in one or both eyes.  You have sudden trouble walking.  You have dizziness.  You have a loss of balance or coordination.  You have a sudden, severe headache with no known cause.  You have new chest pain or an irregular heartbeat. Any of these symptoms may represent a serious problem that is an emergency. Do not wait to see if the symptoms will go away. Get medical help right away. Call your local emergency services (911 in [place] Do not drive yourself to the hospital. Document Released: 10/20/2004 Document Revised: 12/05/2011 Document Reviewed: 03/15/2013 [employer] Patient Information 2014 [employer].  

## 2013-09-13 NOTE — Progress Notes (Deleted)
Established Carotid Patient   History of Present Illness  [name] is a 62 y.o. female patient of Dr. Myra Gianotti who is status post right carotid endarterectomy on 08/21/2007, innominate stent placed on 06/23/2006, and a left common carotid artery stent on 03/31/2005.   On 08/06/12 she had a CT angiogram of neck and chest requested by Dr. Myra Gianotti, the following are the results: 1. Patent short segment stents at the brachial cephalic artery and  left common carotid artery origins.  2. Interval right carotid endarterectomy. Suggestion of ulcerated  plaque in the right CCA at the level of the vocal cords. No  cervical right carotid stenosis.  3. Mild soft plaque in the left CCA. Bulky plaque at the left ICA  origin and proximal bulb with short segment stenosis of up to 60-  65%.  4. Right subclavian artery origin stenosis appears related to soft  plaque and measures up to 65%.  5. Normal vertebral arteries.  6. Chest CTA reported separately. 1. Wide patency of both the right innominate and left common  carotid artery stents without hemodynamically significant stenosis.  (Please refer to separate dictation regarding CTA of the neck).  2. Normal caliber of the thoracic aorta without evidence of  dissection.  3. Extensive centrilobular emphysema.    Patient has {FINDINGS; POSITIVE NEGATIVE:[phone]::"Negative"} history of TIA or stroke symptom.  The patient {Actions; denies-reports:120008::"denies"} amaurosis fugax or monocular blindness.  The patient  {Actions; denies-reports:120008::"denies"} facial drooping.  Pt. {Actions; denies-reports:120008::"denies"} hemiplegia.  The patient {Actions; denies-reports:120008::"denies"} receptive or expressive aphasia.  Pt. {Actions; denies-reports:120008::"denies"} extremity weakness.  The patient's previous neurologic deficits are {improved/worse/unchanged:3041574}.   {Actions; denies-reports:120008::"denies"} New Medical or Surgical History:  ***  Pt Diabetic: {yes/no:20286} Pt smoker: {Smoker?:15292}  Pt meds include: Statin : {yes no:315493::"Yes"} Betablocker: {yes no:315493::"Yes"} ASA: {yes no:315493::"Yes"} Other anticoagulants/antiplatelets: ***   Past Medical History  Diagnosis Date  . Carotid artery occlusion   . Hyperlipidemia   . Mitral valve prolapse   . Hypertension     Social History History  Substance Use Topics  . Smoking status: Former Smoker    Types: Cigarettes    Quit date: 08/06/2002  . Smokeless tobacco: Never Used  . Alcohol Use: No    Family History Family History  Problem Relation Age of Onset  . Heart attack Mother     Surgical History Past Surgical History  Procedure Laterality Date  . Carotid endarterectomy  08/21/2007    right - Dr. Madilyn Fireman  . Abdominal hysterectomy  1997  . Cholecystectomy  2000  . Innominate stent      Allergies  Allergen Reactions  . Ciprocin-Fluocin-Procin [Fluocinolone] Nausea And Vomiting  . Demerol [Meperidine] Nausea And Vomiting  . Fentanyl Nausea And Vomiting    Pt states all pain medications cause N/V for her  . Penicillins Hives    Current Outpatient Prescriptions  Medication Sig Dispense Refill  . aspirin 325 MG tablet Take 325 mg by mouth daily.      Marland Kitchen atorvastatin (LIPITOR) 40 MG tablet Take 40 mg by mouth daily.      . clopidogrel (PLAVIX) 75 MG tablet Take 75 mg by mouth daily.      . diazepam (VALIUM) 2 MG tablet Take 2 mg by mouth daily. Take (2) 2mg  tablets daily totaling 4mg  daily      . lisinopril (PRINIVIL,ZESTRIL) 5 MG tablet Take 5 mg by mouth daily.      . Omega-3 Fatty Acids (FISH OIL) 1000 MG CAPS Take 4,000 mg  by mouth daily. Takes 4 tablets per day      . pantoprazole (PROTONIX) 40 MG tablet Take 2 tablets by mouth daily.       No current facility-administered medications for this visit.    Review of Systems : See HPI for pertinent positives and negatives.  Physical Examination  There were no vitals filed for  this visit.  General: WDWN {Desc; female/female:11659} in NAD GAIT: {PE MWUX:324401} Eyes: PERRLA Pulmonary:  CTAB, {FINDINGS; POSITIVE NEGATIVE:657-112-5710}  Rales, {FINDINGS; POSITIVE NEGATIVE:657-112-5710} rhonchi, & {FINDINGS; POSITIVE NEGATIVE:657-112-5710} wheezing.  Cardiac: {Desc; regular/irreg:14544} Rhythm ,  {FINDINGS; POSITIVE NEGATIVE:657-112-5710} Murmurs.  VASCULAR EXAM Carotid Bruits Left Right   {FINDINGS; POSITIVE NEGATIVE:657-112-5710} {FINDINGS; POSITIVE NEGATIVE:657-112-5710}    Aorta is *** palpable. Radial pulses are *** palpable and *** equal.                                                                                                                            LE Pulses LEFT RIGHT       FEMORAL  {PE DOPPLER EXAM ORTHOSURG:330610::"*** palpable"}  {PE DOPPLER EXAM ORTHOSURG:330610::"*** palpable"}        POPLITEAL  {PE DOPPLER EXAM ORTHOSURG:330610::"*** palpable"}   {PE DOPPLER EXAM ORTHOSURG:330610::"*** palpable"}       POSTERIOR TIBIAL  {PE DOPPLER EXAM ORTHOSURG:330610::"*** palpable"}   {PE DOPPLER EXAM ORTHOSURG:330610::"*** palpable"}        DORSALIS PEDIS      ANTERIOR TIBIAL {PE DOPPLER EXAM ORTHOSURG:330610::"*** palpable"}   {PE DOPPLER EXAM ORTHOSURG:330610::"*** palpable"}        PERONEAL {PE DOPPLER EXAM ORTHOSURG:330610::"*** Palpable"}   {PE DOPPLER EXAM ORTHOSURG:330610::"*** Palpable"}     Gastrointestinal: {UU:7253664},  {pos/neg/not done:321853} masses.  Musculoskeletal: {FINDINGS; POSITIVE NEGATIVE:657-112-5710} muscle atrophy/wasting. M/S 5/5 throughout*** except ***, Extremities without ischemic changes*** except  ***  Neurologic: A&O X 3; Appropriate Affect ; SENSATION ;{vibratory sensation:19809};  Speech is {Findings; speech psychiatric:30485} CN 2-12 intact *** except***, Pain and light touch intact in extremities*** except ***, Motor exam as listed above.   Non-Invasive Vascular Imaging CAROTID DUPLEX 09/13/2013   Right ICA: {Vasm  carotid art exam %:30973} stenosis. Left ICA: {Vasm carotid art exam %:30973} stenosis.  Previous carotid studies demonstrated: RICA {Vasm carotid art exam %:30973} stenosis, LICA {Vasm carotid art exam %:30973} stenosis.  These findings are {improved/worse/unchanged:3041574} from previous exam.  Previous angiogram: {yes no:315493::"Yes"} with findings of ***   Assessment: [name] is a 62 y.o. female who presents with {Desc;symptomatic/asymptomatic 10923}{Vasm carotid art exam %:30973} {Right, left-initial cap:5607} ICA  Stenosis. The  ICA stenosis is  {improved/worse/unchanged:3041574} from previous exam.  Plan: Follow-up in {NUMBERS 1-10:18281} {days/wks/mos/yrs:310907} with Carotid Duplex scan and ***   I discussed in depth with the patient the nature of atherosclerosis, and emphasized the importance of maximal medical management including strict control of blood pressure, blood glucose, and lipid levels, obtaining regular exercise, and cessation of smoking.  The patient is aware that without maximal medical management the underlying atherosclerotic  disease process will progress, limiting the benefit of any interventions. The patient was given information about stroke prevention and what symptoms should prompt the patient to seek immediate medical care. Thank you for allowing [place] to participate in this patient's care.  Charisse March, RN, MSN, FNP-C [facility] [place] Office: [phone]  Clinic Physician: Imogene Burn  09/13/2013 9:37 AM

## 2013-09-16 ENCOUNTER — Encounter: Payer: Self-pay | Admitting: Family

## 2013-09-16 NOTE — Progress Notes (Signed)
This encounter was created in error - please disregard.

## 2013-09-17 ENCOUNTER — Encounter: Payer: Self-pay | Admitting: Family

## 2013-09-17 ENCOUNTER — Ambulatory Visit (INDEPENDENT_AMBULATORY_CARE_PROVIDER_SITE_OTHER): Payer: Self-pay | Admitting: Family

## 2013-09-17 DIAGNOSIS — Z48812 Encounter for surgical aftercare following surgery on the circulatory system: Secondary | ICD-10-CM

## 2013-09-17 DIAGNOSIS — I6529 Occlusion and stenosis of unspecified carotid artery: Secondary | ICD-10-CM

## 2013-09-17 NOTE — Progress Notes (Signed)
Established Carotid Patient   History of Present Illness  [name] is a 62 y.o. female patient of Dr. Myra Gianotti with a history of a right carotid endarterectomy on 08/21/2007. She has had an innominate stent placed on 06/23/2006, and a left common carotid artery stent on 03/31/2005; all of these procedures were performed by Dr. Madilyn Fireman. She returns today for discussion of the results of her cerebrovascular Duplex performed on 09/13/13.   CT angiogram of neck and chest on 08/06/12 requested by Dr. Myra Gianotti: 1. Patent short segment stents at the brachial cephalic artery and  left common carotid artery origins.  2. Interval right carotid endarterectomy. Suggestion of ulcerated  plaque in the right CCA at the level of the vocal cords. No  cervical right carotid stenosis.  3. Mild soft plaque in the left CCA. Bulky plaque at the left ICA  origin and proximal bulb with short segment stenosis of up to 60-  65%.  4. Right subclavian artery origin stenosis appears related to soft  plaque and measures up to 65%.  5. Normal vertebral arteries.  1. Wide patency of both the right innominate and left common  carotid artery stents without hemodynamically significant stenosis.  2. Normal caliber of the thoracic aorta without evidence of  dissection.  3. Extensive centrilobular emphysema.  Patient was not having any symptoms in either arm until this past Summer when she injured her left shoulder, and now is unable to lift left arm above shoulder height; I advised her to see her PCP for guidance to address what seems to be symptoms of rotator cuff injury; denies tingling or numbness in either hand.  Patient has Positive history of TIA but denies any symptoms, was told that she had TIA's by results of CT.  The patient denies amaurosis fugax or monocular blindness.  The patient  Reports mild facial drooping of the left side of face only with smiling.  Pt. denies hemiplegia.  The patient denies receptive  or expressive aphasia.   She denies claudication symptoms in legs.  Patient denies New Medical or Surgical History.  Pt Diabetic: No Pt smoker: former smoker, quit in 2006  Pt meds include: Statin : Yes ASA: Yes Other anticoagulants/antiplatelets: Plavix   Past Medical History  Diagnosis Date  . Carotid artery occlusion   . Hyperlipidemia   . Mitral valve prolapse   . Hypertension     Social History History  Substance Use Topics  . Smoking status: Former Smoker    Types: Cigarettes    Quit date: 08/06/2002  . Smokeless tobacco: Never Used  . Alcohol Use: No    Family History Family History  Problem Relation Age of Onset  . Heart attack Mother   . Hypertension Father   . Pneumonia Father     Surgical History Past Surgical History  Procedure Laterality Date  . Carotid endarterectomy  08/21/2007    right - Dr. Madilyn Fireman  . Abdominal hysterectomy  1997  . Cholecystectomy  2000  . Innominate stent      Allergies  Allergen Reactions  . Ciprocin-Fluocin-Procin [Fluocinolone] Nausea And Vomiting  . Demerol [Meperidine] Nausea And Vomiting  . Fentanyl Nausea And Vomiting    Pt states all pain medications cause N/V for her  . Penicillins Hives    Current Outpatient Prescriptions  Medication Sig Dispense Refill  . aspirin 325 MG tablet Take 325 mg by mouth daily.      Marland Kitchen atorvastatin (LIPITOR) 40 MG tablet Take 40 mg by mouth  daily.      . clopidogrel (PLAVIX) 75 MG tablet Take 75 mg by mouth daily.      . diazepam (VALIUM) 2 MG tablet Take 2 mg by mouth daily. Take (2) 2mg  tablets daily totaling 4mg  daily      . lisinopril (PRINIVIL,ZESTRIL) 5 MG tablet Take 5 mg by mouth daily.      . Omega-3 Fatty Acids (FISH OIL) 1000 MG CAPS Take 4,000 mg by mouth daily. Takes 4 tablets per day      . pantoprazole (PROTONIX) 40 MG tablet Take 2 tablets by mouth daily.       No current facility-administered medications for this visit.    Review of Systems : See HPI for  pertinent positives and negatives.  Physical Examination  Filed Vitals:   09/17/13 0922  BP: 100/68  Pulse: 93  Resp: 16   Filed Weights   09/17/13 0922  Weight: 142 lb (64.411 kg)  Body mass index is 23.26 kg/(m^2).  General: WDWN female in NAD GAIT: normal Eyes: PERRLA Pulmonary:  CTAB, Negative  Rales, Negative rhonchi, & Negative wheezing.  Cardiac: regular Rhythm ,  Negative Murmurs.  VASCULAR EXAM Carotid Bruits Left Right   Positive, soft Positive, moderate    Radial pulses: right is 2+, left is 1+ Brachial pulses: 3+ bilaterally                                                                                                                    Gastrointestinal: soft, nontender, BS WNL, no r/g,  negative masses.  Musculoskeletal: Negative muscle atrophy/wasting. M/S 5/5 throughout, Extremities without ischemic changes.  Neurologic: A&O X 3; Appropriate Affect ; SENSATION ;normal;  Speech is normal CN 2-12 intact  Except slight left facial droop only with smile, tongue is midline, Pain and light touch intact in extremities, Motor exam as listed above.   Non-Invasive Vascular Imaging CAROTID DUPLEX 09/17/2013   Right ICA: <40% stenosis. Left ICA: 40 - 59 % stenosis. Inominate artery stent not visualized. Patent left CCA stent.  These findings are Unchanged from previous exam.   Assessment: [name] is a 62 y.o. female who is status post right carotid endarterectomy on 08/21/2007, innominate stent placement on 06/23/2006, and a left common carotid artery stent placed on 03/31/2005; she presents with asymptomatic minimal right ICA stenosis and mild/moderate left ICA stenosis. The innominate artery stent was not visualized and the left CCA stent is patent. The  ICA stenosis is  Unchanged from previous exam.  Plan: Based on her exam, CT and Duplex results, and after discussing with Dr. Arbie Cookey, patient was advised to follow-up in 1 year with Carotid Duplex  scan.   I discussed in depth with the patient the nature of atherosclerosis, and emphasized the importance of maximal medical management including strict control of blood pressure, blood glucose, and lipid levels, obtaining regular exercise, and continued cessation of smoking.  The patient is aware that without maximal medical management the underlying atherosclerotic disease process will  progress, limiting the benefit of any interventions.   The patient was given information about stroke prevention and what symptoms should prompt the patient to seek immediate medical care. Thank you for allowing [place] to participate in this patient's care.  Charisse March, RN, MSN, FNP-C [facility] [place] Office: [phone]  Clinic Physician: Early  09/17/2013 9:25 AM

## 2013-09-17 NOTE — Patient Instructions (Signed)
Stroke Prevention Some medical conditions and behaviors are associated with an increased chance of having a stroke. You may prevent a stroke by making healthy choices and managing medical conditions. Reduce your risk of having a stroke by:  Staying physically active. Get at least 30 minutes of activity on most or all days.  Not smoking. It may also be helpful to avoid exposure to secondhand smoke.  Limiting alcohol use. Moderate alcohol use is considered to be:  No more than 2 drinks per day for men.  No more than 1 drink per day for nonpregnant women.  Eating healthy foods.  Include 5 or more servings of fruits and vegetables a day.  Certain diets may be prescribed to address high blood pressure, high cholesterol, diabetes, or obesity.  Managing your cholesterol levels.  A low-saturated fat, low-trans fat, low-cholesterol, and high-fiber diet may control cholesterol levels.  Take any prescribed medicines to control cholesterol as directed by your caregiver.  Managing your diabetes.  A controlled-carbohydrate, controlled-sugar diet is recommended to manage diabetes.  Take any prescribed medicines to control diabetes as directed by your caregiver.  Controlling your high blood pressure (hypertension).  A low-salt (sodium), low-saturated fat, low-trans fat, and low-cholesterol diet is recommended to manage high blood pressure.  Take any prescribed medicines to control hypertension as directed by your caregiver.  Maintaining a healthy weight.  A reduced-calorie, low-sodium, low-saturated fat, low-trans fat, low-cholesterol diet is recommended to manage weight.  Stopping drug abuse.  Avoiding birth control pills.  Talk to your caregiver about the risks of taking birth control pills if you are over 35 years old, smoke, get migraines, or have ever had a blood clot.  Getting evaluated for sleep disorders (sleep apnea).  Talk to your caregiver about getting a sleep evaluation  if you snore a lot or have excessive sleepiness.  Taking medicines as directed by your caregiver.  For some people, aspirin or blood thinners (anticoagulants) are helpful in reducing the risk of forming abnormal blood clots that can lead to stroke. If you have the irregular heart rhythm of atrial fibrillation, you should be on a blood thinner unless there is a good reason you cannot take them.  Understand all your medicine instructions. SEEK IMMEDIATE MEDICAL CARE IF:   You have sudden weakness or numbness of the face, arm, or leg, especially on one side of the body.  You have sudden confusion.  You have trouble speaking (aphasia) or understanding.  You have sudden trouble seeing in one or both eyes.  You have sudden trouble walking.  You have dizziness.  You have a loss of balance or coordination.  You have a sudden, severe headache with no known cause.  You have new chest pain or an irregular heartbeat. Any of these symptoms may represent a serious problem that is an emergency. Do not wait to see if the symptoms will go away. Get medical help right away. Call your local emergency services (911 in [place] Do not drive yourself to the hospital. Document Released: 10/20/2004 Document Revised: 12/05/2011 Document Reviewed: 03/15/2013 [employer] Patient Information 2014 [employer].  

## 2014-05-22 ENCOUNTER — Ambulatory Visit: Payer: BC Managed Care – PPO | Admitting: Interventional Cardiology

## 2014-05-22 ENCOUNTER — Encounter: Payer: Self-pay | Admitting: Cardiology

## 2014-05-22 DIAGNOSIS — E782 Mixed hyperlipidemia: Secondary | ICD-10-CM

## 2014-05-22 DIAGNOSIS — I10 Essential (primary) hypertension: Secondary | ICD-10-CM

## 2014-05-22 DIAGNOSIS — I739 Peripheral vascular disease, unspecified: Secondary | ICD-10-CM

## 2014-05-22 HISTORY — DX: Mixed hyperlipidemia: E78.2

## 2014-05-22 HISTORY — DX: Essential (primary) hypertension: I10

## 2014-05-22 HISTORY — DX: Peripheral vascular disease, unspecified: I73.9

## 2014-09-22 ENCOUNTER — Other Ambulatory Visit (HOSPITAL_COMMUNITY): Payer: Self-pay

## 2014-09-22 ENCOUNTER — Ambulatory Visit: Payer: Self-pay | Admitting: Family

## 2014-09-23 ENCOUNTER — Encounter: Payer: Self-pay | Admitting: Interventional Cardiology

## 2014-09-23 ENCOUNTER — Encounter: Payer: Self-pay | Admitting: Family

## 2014-09-29 ENCOUNTER — Ambulatory Visit: Payer: Self-pay | Admitting: Family

## 2014-09-29 ENCOUNTER — Other Ambulatory Visit (HOSPITAL_COMMUNITY): Payer: Self-pay

## 2014-10-14 ENCOUNTER — Other Ambulatory Visit (HOSPITAL_COMMUNITY): Payer: Self-pay

## 2014-10-14 ENCOUNTER — Ambulatory Visit: Payer: Self-pay | Admitting: Family

## 2014-11-27 ENCOUNTER — Ambulatory Visit: Payer: Self-pay | Admitting: Family

## 2014-11-27 ENCOUNTER — Other Ambulatory Visit (HOSPITAL_COMMUNITY): Payer: Self-pay

## 2015-01-01 ENCOUNTER — Encounter: Payer: Self-pay | Admitting: Family

## 2015-01-02 ENCOUNTER — Ambulatory Visit (HOSPITAL_COMMUNITY)
Admission: RE | Admit: 2015-01-02 | Discharge: 2015-01-02 | Disposition: A | Discharge status: Home / Self Care | Payer: Self-pay | Source: Ambulatory Visit | Attending: Family | Admitting: Family

## 2015-01-02 ENCOUNTER — Encounter: Payer: Self-pay | Admitting: Family

## 2015-01-02 ENCOUNTER — Ambulatory Visit (INDEPENDENT_AMBULATORY_CARE_PROVIDER_SITE_OTHER): Payer: Self-pay | Admitting: Family

## 2015-01-02 VITALS — BP 139/76 | HR 68 | Resp 16 | Ht 65.5 in | Wt 140.0 lb

## 2015-01-02 DIAGNOSIS — Z48812 Encounter for surgical aftercare following surgery on the circulatory system: Secondary | ICD-10-CM

## 2015-01-02 DIAGNOSIS — Z9889 Other specified postprocedural states: Secondary | ICD-10-CM

## 2015-01-02 DIAGNOSIS — Z959 Presence of cardiac and vascular implant and graft, unspecified: Secondary | ICD-10-CM

## 2015-01-02 DIAGNOSIS — Z87891 Personal history of nicotine dependence: Secondary | ICD-10-CM

## 2015-01-02 DIAGNOSIS — I6523 Occlusion and stenosis of bilateral carotid arteries: Secondary | ICD-10-CM | POA: Insufficient documentation

## 2015-01-02 NOTE — Progress Notes (Signed)
Established Carotid Patient   History of Present Illness  [name] is a 64 y.o. female patient of Dr. Trula Slade with a history of a right carotid endarterectomy on 08/21/2007. She has had an innominate artery stent placed on 06/23/2006, and a left common carotid artery stent on 03/31/2005; all of these procedures were performed by Dr. Amedeo Plenty. She returns today for follow up.  CT angiogram of neck and chest on 08/06/12 requested by Dr. Trula Slade: 1. Patent short segment stents at the brachial cephalic artery and  left common carotid artery origins.  2. Interval right carotid endarterectomy. Suggestion of ulcerated  plaque in the right CCA at the level of the vocal cords. No  cervical right carotid stenosis.  3. Mild soft plaque in the left CCA. Bulky plaque at the left ICA  origin and proximal bulb with short segment stenosis of up to 60-  65%.  4. Right subclavian artery origin stenosis appears related to soft  plaque and measures up to 65%.  5. Normal vertebral arteries.  1. Wide patency of both the right innominate and left common  carotid artery stents without hemodynamically significant stenosis.  2. Normal caliber of the thoracic aorta without evidence of  dissection.  3. Extensive centrilobular emphysema.  Patient was not having any symptoms in either arm until Summer of 2014 when she injured her left shoulder, and now is unable to lift left arm above shoulder height; I advised her to see her PCP for guidance to address what seems to be symptoms of rotator cuff injury; denies tingling or numbness in either hand.  Patient has a history of a TIA in 2006 but denies any symptoms, states hs had no sx's other than dizziness, was told that she had TIA's by results of CT. The patient denies amaurosis fugax or monocular blindness. The patient Reports mild facial drooping of the left side of face only with smiling. Pt. denies hemiplegia. The patient denies receptive or  expressive aphasia.  She denies claudication symptoms in legs.  Patient denies New Medical or Surgical History. She walks 2 miles daily, is diligent about eating healthy.  Pt Diabetic: No Pt smoker: former smoker, quit in 2006  Pt meds include: Statin : Yes ASA: Yes Other anticoagulants/antiplatelets: Plavix  Past Medical History  Diagnosis Date  . Carotid artery occlusion   . Hyperlipidemia   . Mitral valve prolapse   . Hypertension   . Mixed hyperlipidemia 05/22/2014  . Essential hypertension, benign 05/22/2014  . Peripheral vascular disease, unspecified 05/22/2014  . Reflux esophagitis   . Coronary atherosclerosis of native coronary artery   . Melanoma   . Stroke 2006    Mini  Stroke- not major    Social History History  Substance Use Topics  . Smoking status: Former Smoker    Types: Cigarettes    Quit date: 08/06/2002  . Smokeless tobacco: Never Used  . Alcohol Use: No    Family History Family History  Problem Relation Age of Onset  . Heart attack Mother   . CAD Mother   . Heart disease Mother     Before age 69  . Hypertension Mother   . Hyperlipidemia Mother   . Hypertension Father   . Pneumonia Father   . Hyperlipidemia Father   . Heart murmur Sister   . Osteoporosis Paternal Grandmother   . CAD Paternal Grandfather     Surgical History Past Surgical History  Procedure Laterality Date  . Carotid endarterectomy  08/21/2007  right - Dr. Amedeo Plenty  . Abdominal hysterectomy  1997  . Cholecystectomy  2000  . Innominate stent      Allergies  Allergen Reactions  . Ciprocin-Fluocin-Procin [Fluocinolone] Nausea And Vomiting  . Demerol [Meperidine] Nausea And Vomiting  . Fentanyl Nausea And Vomiting    Pt states all pain medications cause N/V for her  . Penicillins Hives    Current Outpatient Prescriptions  Medication Sig Dispense Refill  . aspirin 325 MG tablet Take 325 mg by mouth daily.    Marland Kitchen atorvastatin (LIPITOR) 40 MG tablet Take 40 mg by  mouth daily.    . cholecalciferol (VITAMIN D) 1000 UNITS tablet Take 1,000 Units by mouth daily.    . clopidogrel (PLAVIX) 75 MG tablet Take 75 mg by mouth daily.    . fexofenadine (ALLEGRA) 180 MG tablet Take 180 mg by mouth daily.    Marland Kitchen lisinopril (PRINIVIL,ZESTRIL) 5 MG tablet Take 5 mg by mouth daily.    . Omega-3 Fatty Acids (FISH OIL) 1000 MG CAPS Take 4,000 mg by mouth daily. Takes 4 tablets per day    . pantoprazole (PROTONIX) 40 MG tablet Take 2 tablets by mouth daily.    . ranitidine (ZANTAC) 150 MG tablet Take 150 mg by mouth at bedtime.    . diazepam (VALIUM) 2 MG tablet Take 2 mg by mouth daily. Take (2) 2mg  tablets daily totaling 4mg  daily     No current facility-administered medications for this visit.    Review of Systems : See HPI for pertinent positives and negatives.  Physical Examination  Filed Vitals:   01/02/15 1012 01/02/15 1014 01/02/15 1021  BP: 132/75 143/74 139/76  Pulse: 65 66 68  Resp:  16   Height:  5' 5.5" (1.664 m)   Weight:  140 lb (63.504 kg)   SpO2:  100%    Body mass index is 22.93 kg/(m^2).  General: WDWN female in NAD GAIT: normal Eyes: PERRLA Pulmonary: CTAB, Negative Rales, Negative rhonchi, & Negative wheezing.  Cardiac: regular Rhythm, no detected murmur  VASCULAR EXAM Carotid Bruits Left Right   Positive, soft Positive, moderate   Radial pulses: 2+ palpable and = Brachial pulses: 3+ bilaterally      Gastrointestinal: soft, nontender, BS WNL, no r/g, no palpable masses.  Musculoskeletal: Negative muscle atrophy/wasting. M/S 5/5 throughout, Extremities without ischemic changes.  Neurologic: A&O X 3; Appropriate Affect, Speech is normal CN 2-12 intact Except slight left facial droop only with smile, tongue is midline, Pain and light touch intact in extremities, Motor exam as listed above.          Non-Invasive Vascular Imaging CAROTID DUPLEX 01/02/2015   CEREBROVASCULAR DUPLEX EVALUATION    INDICATION: Carotid endarterectomy     PREVIOUS INTERVENTION(S): Right carotid endarterectomy on 08/21/07; Innominate artery stent on 06/23/06; Left common carotid artery stent on 03/31/05    DUPLEX EXAM:     RIGHT  LEFT  Peak Systolic Velocities (cm/s) End Diastolic Velocities (cm/s) Plaque LOCATION Peak Systolic Velocities (cm/s) End Diastolic Velocities (cm/s) Plaque  106 18  CCA PROXIMAL 109 18   97 27  CCA MID 121 37   88 22  CCA DISTAL 93 29   145 26  ECA 109 24   66 20  ICA PROXIMAL 135 46 CP  89 27  ICA MID 77 32   92 34  ICA DISTAL 62 18     Not Calculated ICA / CCA Ratio (PSV) 1.5  Antegrade Vertebral Flow Antegrade  138 Brachial  Systolic Pressure (mmHg) 110  Multiphasic (subclavian artery) Brachial Artery Waveforms Multiphasic (subclavian artery)    Plaque Morphology:  HM = Homogeneous, HT = Heterogeneous, CP = Calcific Plaque, SP = Smooth Plaque, IP = Irregular Plaque  ADDITIONAL FINDINGS: . No significant change in the bilateral external or common carotid arteries. . Patent innominate artery stent noted based on limited visualization.    IMPRESSION: 1. Patent right carotid endarterectomy site and left common carotid artery stent with no significant stenosis visualized. 2. Doppler velocities suggest a 40-59% stenosis of the left proximal internal carotid artery.    Compared to the previous exam:  No significant change noted when compared to the previous exam on 09/13/13.       Assessment: SAARA KIJOWSKI is a 64 y.o. female who is s/p right carotid endarterectomy on 08/21/07; Innominate artery stent on 06/23/06; Left common carotid artery stent on 03/31/05. Patient has a history of a TIA in 2006 but denies any symptoms. She walks 2 miles daily, is diligent about eating healthy.  Today's carotid Duplex suggests a patent right carotid endarterectomy site, patent  innominate artery stent based on limited visualization, left common carotid artery stent with no significant stenosis visualized, and 40-59% stenosis of the left proximal internal carotid artery. No significant change noted when compared to the previous exam on 09/13/13.   Plan: Follow-up in 1 year with Carotid Duplex.   I discussed in depth with the patient the nature of atherosclerosis, and emphasized the importance of maximal medical management including strict control of blood pressure, blood glucose, and lipid levels, obtaining regular exercise, and continued cessation of smoking.  The patient is aware that without maximal medical management the underlying atherosclerotic disease process will progress, limiting the benefit of any interventions. The patient was given information about stroke prevention and what symptoms should prompt the patient to seek immediate medical care. Thank you for allowing [place] to participate in this patient's care.  Clemon Chambers, RN, MSN, FNP-C [facility] Office: [phone]  Clinic Physician: Bridgett Larsson  01/02/2015 10:25 AM

## 2015-01-02 NOTE — Patient Instructions (Signed)
Stroke Prevention Some medical conditions and behaviors are associated with an increased chance of having a stroke. You may prevent a stroke by making healthy choices and managing medical conditions. HOW CAN I REDUCE MY RISK OF HAVING A STROKE?   Stay physically active. Get at least 30 minutes of activity on most or all days.  Do not smoke. It may also be helpful to avoid exposure to secondhand smoke.  Limit alcohol use. Moderate alcohol use is considered to be:  No more than 2 drinks per day for men.  No more than 1 drink per day for nonpregnant women.  Eat healthy foods. This involves:  Eating 5 or more servings of fruits and vegetables a day.  Making dietary changes that address high blood pressure (hypertension), high cholesterol, diabetes, or obesity.  Manage your cholesterol levels.  Making food choices that are high in fiber and low in saturated fat, trans fat, and cholesterol may control cholesterol levels.  Take any prescribed medicines to control cholesterol as directed by your health care provider.  Manage your diabetes.  Controlling your carbohydrate and sugar intake is recommended to manage diabetes.  Take any prescribed medicines to control diabetes as directed by your health care provider.  Control your hypertension.  Making food choices that are low in salt (sodium), saturated fat, trans fat, and cholesterol is recommended to manage hypertension.  Take any prescribed medicines to control hypertension as directed by your health care provider.  Maintain a healthy weight.  Reducing calorie intake and making food choices that are low in sodium, saturated fat, trans fat, and cholesterol are recommended to manage weight.  Stop drug abuse.  Avoid taking birth control pills.  Talk to your health care provider about the risks of taking birth control pills if you are over 35 years old, smoke, get migraines, or have ever had a blood clot.  Get evaluated for sleep  disorders (sleep apnea).  Talk to your health care provider about getting a sleep evaluation if you snore a lot or have excessive sleepiness.  Take medicines only as directed by your health care provider.  For some people, aspirin or blood thinners (anticoagulants) are helpful in reducing the risk of forming abnormal blood clots that can lead to stroke. If you have the irregular heart rhythm of atrial fibrillation, you should be on a blood thinner unless there is a good reason you cannot take them.  Understand all your medicine instructions.  Make sure that other conditions (such as anemia or atherosclerosis) are addressed. SEEK IMMEDIATE MEDICAL CARE IF:   You have sudden weakness or numbness of the face, arm, or leg, especially on one side of the body.  Your face or eyelid droops to one side.  You have sudden confusion.  You have trouble speaking (aphasia) or understanding.  You have sudden trouble seeing in one or both eyes.  You have sudden trouble walking.  You have dizziness.  You have a loss of balance or coordination.  You have a sudden, severe headache with no known cause.  You have new chest pain or an irregular heartbeat. Any of these symptoms may represent a serious problem that is an emergency. Do not wait to see if the symptoms will go away. Get medical help at once. Call your local emergency services (911 in [place] Do not drive yourself to the hospital. Document Released: 10/20/2004 Document Revised: 01/27/2014 Document Reviewed: 03/15/2013 [employer] Patient Information 2015 [employer]. This information is not intended to replace advice given   to you by your health care provider. Make sure you discuss any questions you have with your health care provider.  

## 2015-01-02 NOTE — Progress Notes (Signed)
Filed Vitals:   01/02/15 1012 01/02/15 1014 01/02/15 1021  BP: 132/75 143/74 139/76  Pulse: 65 66 68  Resp:  16   Height:  5' 5.5" (1.664 m)   Weight:  140 lb (63.504 kg)   SpO2:  100%

## 2015-02-22 ENCOUNTER — Emergency Department (HOSPITAL_BASED_OUTPATIENT_CLINIC_OR_DEPARTMENT_OTHER)
Admission: EM | Admit: 2015-02-22 | Discharge: 2015-02-22 | Disposition: A | Discharge status: Home / Self Care | Payer: Self-pay | Attending: Emergency Medicine | Admitting: Emergency Medicine

## 2015-02-22 ENCOUNTER — Encounter (HOSPITAL_BASED_OUTPATIENT_CLINIC_OR_DEPARTMENT_OTHER): Payer: Self-pay | Admitting: Emergency Medicine

## 2015-02-22 DIAGNOSIS — Y9389 Activity, other specified: Secondary | ICD-10-CM | POA: Insufficient documentation

## 2015-02-22 DIAGNOSIS — Z85820 Personal history of malignant melanoma of skin: Secondary | ICD-10-CM | POA: Insufficient documentation

## 2015-02-22 DIAGNOSIS — Z9861 Coronary angioplasty status: Secondary | ICD-10-CM | POA: Insufficient documentation

## 2015-02-22 DIAGNOSIS — Z7902 Long term (current) use of antithrombotics/antiplatelets: Secondary | ICD-10-CM | POA: Insufficient documentation

## 2015-02-22 DIAGNOSIS — Z8673 Personal history of transient ischemic attack (TIA), and cerebral infarction without residual deficits: Secondary | ICD-10-CM | POA: Insufficient documentation

## 2015-02-22 DIAGNOSIS — I739 Peripheral vascular disease, unspecified: Secondary | ICD-10-CM | POA: Insufficient documentation

## 2015-02-22 DIAGNOSIS — Z79899 Other long term (current) drug therapy: Secondary | ICD-10-CM | POA: Insufficient documentation

## 2015-02-22 DIAGNOSIS — Y998 Other external cause status: Secondary | ICD-10-CM | POA: Insufficient documentation

## 2015-02-22 DIAGNOSIS — I10 Essential (primary) hypertension: Secondary | ICD-10-CM | POA: Insufficient documentation

## 2015-02-22 DIAGNOSIS — Z880 Allergy status to penicillin: Secondary | ICD-10-CM | POA: Insufficient documentation

## 2015-02-22 DIAGNOSIS — Y92009 Unspecified place in unspecified non-institutional (private) residence as the place of occurrence of the external cause: Secondary | ICD-10-CM | POA: Insufficient documentation

## 2015-02-22 DIAGNOSIS — S61432A Puncture wound without foreign body of left hand, initial encounter: Secondary | ICD-10-CM | POA: Insufficient documentation

## 2015-02-22 DIAGNOSIS — Z87891 Personal history of nicotine dependence: Secondary | ICD-10-CM | POA: Insufficient documentation

## 2015-02-22 DIAGNOSIS — Z23 Encounter for immunization: Secondary | ICD-10-CM | POA: Insufficient documentation

## 2015-02-22 DIAGNOSIS — S61452A Open bite of left hand, initial encounter: Secondary | ICD-10-CM

## 2015-02-22 DIAGNOSIS — E785 Hyperlipidemia, unspecified: Secondary | ICD-10-CM | POA: Insufficient documentation

## 2015-02-22 DIAGNOSIS — K210 Gastro-esophageal reflux disease with esophagitis: Secondary | ICD-10-CM | POA: Insufficient documentation

## 2015-02-22 DIAGNOSIS — I2510 Atherosclerotic heart disease of native coronary artery without angina pectoris: Secondary | ICD-10-CM | POA: Insufficient documentation

## 2015-02-22 DIAGNOSIS — W5501XA Bitten by cat, initial encounter: Secondary | ICD-10-CM | POA: Insufficient documentation

## 2015-02-22 DIAGNOSIS — Z7982 Long term (current) use of aspirin: Secondary | ICD-10-CM | POA: Insufficient documentation

## 2015-02-22 MED ORDER — DOXYCYCLINE HYCLATE 100 MG PO CAPS: 100.0000 mg | ORAL_CAPSULE | Freq: Two times a day (BID) | ORAL | Status: DC

## 2015-02-22 MED ORDER — TETANUS-DIPHTH-ACELL PERTUSSIS 5-2.5-18.5 LF-MCG/0.5 IM SUSP
0.5000 mL | Freq: Once | INTRAMUSCULAR | Status: AC
  Administered 2015-02-22: 0.5 mL via INTRAMUSCULAR
  Filled 2015-02-22: qty 0.5

## 2015-02-22 NOTE — ED Provider Notes (Signed)
CSN: [id number]     Arrival date & time 02/22/15  1159 History   First MD Initiated Contact with Patient 02/22/15 1200     Chief Complaint  Patient presents with  . Animal Bite     (Consider location/radiation/quality/duration/timing/severity/associated sxs/prior Treatment) HPI Comments: [id number] year old female presenting with a Bite to the palm of her left hand occurring about one hour prior to arrival. Reports her house Was trying to scratch himself when he accidentally bit her hands. Reports the wound bleeds when she is not applying pressure. States the area stings. Last tetanus shot 2006. The cat is not up-to-date on rabies vaccine, however this is a house cat and has only gone outside a few times. She already contacted the vet.  Patient is a 64 y.o. female presenting with animal bite. The history is provided by the patient and the spouse.  Animal Bite Location:  Hand Hand injury location:  L hand Time since incident:  1 hour Pain details:    Quality:  Stinging   Progression:  Unchanged Incident location:  Home Animal's rabies vaccination status:  Out of date Associated symptoms: no numbness     Past Medical History  Diagnosis Date  . Carotid artery occlusion   . Hyperlipidemia   . Mitral valve prolapse   . Hypertension   . Mixed hyperlipidemia 05/22/2014  . Essential hypertension, benign 05/22/2014  . Peripheral vascular disease, unspecified 05/22/2014  . Reflux esophagitis   . Coronary atherosclerosis of native coronary artery   . Melanoma   . Stroke 2006    Mini  Stroke- not major   Past Surgical History  Procedure Laterality Date  . Carotid endarterectomy  08/21/2007    right - Dr. Amedeo Plenty  . Abdominal hysterectomy  1997  . Cholecystectomy  2000  . Innominate stent     Family History  Problem Relation Age of Onset  . Heart attack Mother   . CAD Mother   . Heart disease Mother     Before age 36  . Hypertension Mother   . Hyperlipidemia Mother   .  Hypertension Father   . Pneumonia Father   . Hyperlipidemia Father   . Heart murmur Sister   . Osteoporosis Paternal Grandmother   . CAD Paternal Grandfather    History  Substance Use Topics  . Smoking status: Former Smoker    Types: Cigarettes    Quit date: 08/06/2002  . Smokeless tobacco: Never Used  . Alcohol Use: No   OB History    No data available     Review of Systems  Skin: Positive for wound.  Neurological: Negative for numbness.      Allergies  Ciprocin-fluocin-procin; Demerol; Fentanyl; and Penicillins  Home Medications   Prior to Admission medications   Medication Sig Start Date End Date Taking? Authorizing Provider  aspirin 325 MG tablet Take 325 mg by mouth daily.    Historical Provider, MD  atorvastatin (LIPITOR) 40 MG tablet Take 40 mg by mouth daily. 07/01/12   Historical Provider, MD  cholecalciferol (VITAMIN D) 1000 UNITS tablet Take 1,000 Units by mouth daily.    Historical Provider, MD  clopidogrel (PLAVIX) 75 MG tablet Take 75 mg by mouth daily. 05/26/12   Historical Provider, MD  diazepam (VALIUM) 2 MG tablet Take 2 mg by mouth daily. Take (2) 2mg  tablets daily totaling 4mg  daily 08/05/12   Historical Provider, MD  doxycycline (VIBRAMYCIN) 100 MG capsule Take 1 capsule (100 mg total) by mouth 2 (two) times daily. One  po bid x 7 days 02/22/15   Carman Ching, PA-C  fexofenadine (ALLEGRA) 180 MG tablet Take 180 mg by mouth daily.    Historical Provider, MD  lisinopril (PRINIVIL,ZESTRIL) 5 MG tablet Take 5 mg by mouth daily. 07/11/12   Historical Provider, MD  Omega-3 Fatty Acids (FISH OIL) 1000 MG CAPS Take 4,000 mg by mouth daily. Takes 4 tablets per day    Historical Provider, MD  pantoprazole (PROTONIX) 40 MG tablet Take 2 tablets by mouth daily. 08/05/12   Historical Provider, MD  ranitidine (ZANTAC) 150 MG tablet Take 150 mg by mouth at bedtime.    Historical Provider, MD   BP 169/84 mmHg  Pulse 80  Temp(Src) 98.3 F (36.8 C)  Resp 18  SpO2  100% Physical Exam  Constitutional: She is oriented to person, place, and time. She appears well-developed and well-nourished. No distress.  HENT:  Head: Normocephalic and atraumatic.  Mouth/Throat: Oropharynx is clear and moist.  Eyes: Conjunctivae and EOM are normal.  Neck: Normal range of motion. Neck supple.  Cardiovascular: Normal rate, regular rhythm and normal heart sounds.   Pulmonary/Chest: Effort normal and breath sounds normal. No respiratory distress.  Musculoskeletal: Normal range of motion. She exhibits no edema.       Hands: 1 cm curvilinear puncture wound to thenar eminence of L hand. Small amount of active bleeding. FROM of L hand. Normal opposition, full flexion and extension of L thumb at MCP and DIP. Cap refill < 2 seconds.  Neurological: She is alert and oriented to person, place, and time. No sensory deficit.  Skin: Skin is warm and dry.  Psychiatric: She has a normal mood and affect. Her behavior is normal.  Nursing note and vitals reviewed.   ED Course  Procedures (including critical care time) Labs Review Labs Reviewed - No data to display  Imaging Review No results found.   EKG Interpretation None      MDM   Final diagnoses:  Cat bite of left hand, initial encounter   NAD. Neurovascularly intact. No evidence of tendon disruption. Tdap updated. Wound care given. Steri-strips applied. Pt prefers to hold on rabies vaccination at this time. Rx doxy as pt has PCN allergy. F/u with PCP in 2-3 days for recheck. Stable for d/c. Return precautions given. Patient states understanding of treatment care plan and is agreeable.  Carman Ching, PA-C 02/22/15 1251  Blanchie Dessert, MD 02/23/15 1006

## 2015-02-22 NOTE — ED Notes (Addendum)
Pt in c/o cat bite to L hand, located proximal to thumb on palm of hand. Actively bleeding when pressure is not applied, dressed with gauze in triage. States cat is not UTD on vaccines but is a house cat but does go outside.

## 2015-02-22 NOTE — Discharge Instructions (Signed)
Take doxycycline twice daily for 7 days. Keep the wound clean and dry.  Animal Bite An animal bite can result in a scratch on the skin, deep open cut, puncture of the skin, crush injury, or tearing away of the skin or a body part. Dogs are responsible for most animal bites. Children are bitten more often than adults. An animal bite can range from very mild to more serious. A small bite from your house pet is no cause for alarm. However, some animal bites can become infected or injure a bone or other tissue. You must seek medical care if:  The skin is broken and bleeding does not slow down or stop after 15 minutes.  The puncture is deep and difficult to clean (such as a cat bite).  Pain, warmth, redness, or pus develops around the wound.  The bite is from a stray animal or rodent. There may be a risk of rabies infection.  The bite is from a snake, raccoon, skunk, fox, coyote, or bat. There may be a risk of rabies infection.  The person bitten has a chronic illness such as diabetes, liver disease, or cancer, or the person takes medicine that lowers the immune system.  There is concern about the location and severity of the bite. It is important to clean and protect an animal bite wound right away to prevent infection. Follow these steps:  Clean the wound with plenty of water and soap.  Apply an antibiotic cream.  Apply gentle pressure over the wound with a clean towel or gauze to slow or stop bleeding.  Elevate the affected area above the heart to help stop any bleeding.  Seek medical care. Getting medical care within 8 hours of the animal bite leads to the best possible outcome. DIAGNOSIS  Your caregiver will most likely:  Take a detailed history of the animal and the bite injury.  Perform a wound exam.  Take your medical history. Blood tests or X-rays may be performed. Sometimes, infected bite wounds are cultured and sent to a lab to identify the infectious bacteria.  TREATMENT    Medical treatment will depend on the location and type of animal bite as well as the patient's medical history. Treatment may include:  Wound care, such as cleaning and flushing the wound with saline solution, bandaging, and elevating the affected area.  Antibiotics.  Tetanus immunization.  Rabies immunization.  Leaving the wound open to heal. This is often done with animal bites, due to the high risk of infection. However, in certain cases, wound closure with stitches, wound adhesive, skin adhesive strips, or staples may be used. Infected bites that are left untreated may require intravenous (IV) antibiotics and surgical treatment in the hospital. [place]  Follow your caregiver's instructions for wound care.  Take all medicines as directed.  If your caregiver prescribes antibiotics, take them as directed. Finish them even if you start to feel better.  Follow up with your caregiver for further exams or immunizations as directed. You may need a tetanus shot if:  You cannot remember when you had your last tetanus shot.  You have never had a tetanus shot.  The injury broke your skin. If you get a tetanus shot, your arm may swell, get red, and feel warm to the touch. This is common and not a problem. If you need a tetanus shot and you choose not to have one, there is a rare chance of getting tetanus. Sickness from tetanus can be serious. SEEK  MEDICAL CARE IF:  You notice warmth, redness, soreness, swelling, pus discharge, or a bad smell coming from the wound.  You have a red line on the skin coming from the wound.  You have a fever, chills, or a general ill feeling.  You have nausea or vomiting.  You have continued or worsening pain.  You have trouble moving the injured part.  You have other questions or concerns. MAKE SURE YOU:  Understand these instructions.  Will watch your condition.  Will get help right away if you are not doing well or get  worse. Document Released: 05/31/2011 Document Revised: 12/05/2011 Document Reviewed: 05/31/2011 [facility] Patient Information 2015 [place], [place]. This information is not intended to replace advice given to you by your health care provider. Make sure you discuss any questions you have with your health care provider.

## 2015-02-23 ENCOUNTER — Emergency Department (HOSPITAL_BASED_OUTPATIENT_CLINIC_OR_DEPARTMENT_OTHER): Payer: Self-pay

## 2015-02-23 ENCOUNTER — Encounter (HOSPITAL_BASED_OUTPATIENT_CLINIC_OR_DEPARTMENT_OTHER): Payer: Self-pay

## 2015-02-23 ENCOUNTER — Emergency Department (HOSPITAL_BASED_OUTPATIENT_CLINIC_OR_DEPARTMENT_OTHER)
Admission: EM | Admit: 2015-02-23 | Discharge: 2015-02-23 | Disposition: A | Discharge status: Home / Self Care | Payer: Self-pay | Attending: Emergency Medicine | Admitting: Emergency Medicine

## 2015-02-23 DIAGNOSIS — L03022 Acute lymphangitis of left finger: Secondary | ICD-10-CM | POA: Insufficient documentation

## 2015-02-23 DIAGNOSIS — I10 Essential (primary) hypertension: Secondary | ICD-10-CM | POA: Insufficient documentation

## 2015-02-23 DIAGNOSIS — L089 Local infection of the skin and subcutaneous tissue, unspecified: Secondary | ICD-10-CM

## 2015-02-23 DIAGNOSIS — W5501XD Bitten by cat, subsequent encounter: Secondary | ICD-10-CM | POA: Insufficient documentation

## 2015-02-23 DIAGNOSIS — I891 Lymphangitis: Secondary | ICD-10-CM

## 2015-02-23 DIAGNOSIS — S61452D Open bite of left hand, subsequent encounter: Secondary | ICD-10-CM

## 2015-02-23 HISTORY — PX: OTHER SURGICAL HISTORY: SHX169

## 2015-02-23 LAB — CBC WITH DIFFERENTIAL/PLATELET
BASOS ABS: 0 10*3/uL (ref 0.0–0.1)
Basophils Relative: 0 % (ref 0–1)
EOS ABS: 0.2 10*3/uL (ref 0.0–0.7)
EOS PCT: 2 % (ref 0–5)
HEMATOCRIT: 43.5 % (ref 36.0–46.0)
Hemoglobin: 14.1 g/dL (ref 12.0–15.0)
LYMPHS ABS: 2.3 10*3/uL (ref 0.7–4.0)
Lymphocytes Relative: 23 % (ref 12–46)
MCH: 28.5 pg (ref 26.0–34.0)
MCHC: 32.4 g/dL (ref 30.0–36.0)
MCV: 88.1 fL (ref 78.0–100.0)
MONO ABS: 1 10*3/uL (ref 0.1–1.0)
MONOS PCT: 10 % (ref 3–12)
NEUTROS PCT: 65 % (ref 43–77)
Neutro Abs: 6.6 10*3/uL (ref 1.7–7.7)
Platelets: 316 10*3/uL (ref 150–400)
RBC: 4.94 MIL/uL (ref 3.87–5.11)
RDW: 13.6 % (ref 11.5–15.5)
WBC: 10.1 10*3/uL (ref 4.0–10.5)

## 2015-02-23 LAB — BASIC METABOLIC PANEL
Anion gap: 13 (ref 5–15)
BUN: 13 mg/dL (ref 6–20)
CALCIUM: 10.4 mg/dL — AB (ref 8.9–10.3)
CHLORIDE: 104 mmol/L (ref 101–111)
CO2: 26 mmol/L (ref 22–32)
Creatinine, Ser: 0.68 mg/dL (ref 0.44–1.00)
GFR calc Af Amer: >60 mL/min (ref >60)
GLUCOSE: 105 mg/dL — AB (ref 65–99)
Potassium: 3.9 mmol/L (ref 3.5–5.1)
Sodium: 143 mmol/L (ref 135–145)

## 2015-02-23 MED ORDER — LIDOCAINE HCL 2 % IJ SOLN
30.0000 mL | Freq: Once | INTRAMUSCULAR | Status: DC
  Filled 2015-02-23: qty 30

## 2015-02-23 MED ORDER — ACETAMINOPHEN 500 MG PO TABS
1000.0000 mg | ORAL_TABLET | Freq: Once | ORAL | Status: AC
  Administered 2015-02-23: 1000 mg via ORAL
  Filled 2015-02-23: qty 2

## 2015-02-23 MED ORDER — DEXTROSE 5 % IV SOLN
1.0000 g | Freq: Once | INTRAVENOUS | Status: AC
  Administered 2015-02-23: 1 g via INTRAVENOUS

## 2015-02-23 MED ORDER — LIDOCAINE HCL (PF) 2 % IJ SOLN
10.0000 mL | Freq: Once | INTRAMUSCULAR | Status: DC
  Filled 2015-02-23: qty 10

## 2015-02-23 MED ORDER — LIDOCAINE HCL (PF) 1 % IJ SOLN
INTRAMUSCULAR | Status: AC
  Filled 2015-02-23: qty 15

## 2015-02-23 MED ORDER — ERTAPENEM SODIUM 1 G IJ SOLR: 1.0000 g | Freq: Once | INTRAMUSCULAR | Status: DC

## 2015-02-23 MED ORDER — CEFTRIAXONE SODIUM 1 G IJ SOLR
INTRAMUSCULAR | Status: AC
  Filled 2015-02-23: qty 10

## 2015-02-23 MED ORDER — METRONIDAZOLE IN NACL 5-0.79 MG/ML-% IV SOLN
500.0000 mg | Freq: Once | INTRAVENOUS | Status: AC
  Administered 2015-02-23: 500 mg via INTRAVENOUS
  Filled 2015-02-23: qty 100

## 2015-02-23 MED ORDER — LIDOCAINE HCL 2 % IJ SOLN: 20.0000 mL | Freq: Once | INTRAMUSCULAR | Status: DC

## 2015-02-23 NOTE — ED Notes (Signed)
Dr Amedeo Plenty at bedside performing procedure on patient hand.

## 2015-02-23 NOTE — ED Notes (Signed)
Patient here to have cat bite rechecked due to redness around wound and red streak moving up left forearm. Steri-strips in place

## 2015-02-23 NOTE — Consult Note (Signed)
Reason for Consult: Infected cat bite Referring Physician: [place] physician  [name] is an 64 y.o. female.  HPI: 64 year old female status post Bite yesterday. She was started on doxycycline given a penicillin out to. She states she soak it briefly but that was less than 5 minutes in the ER. She presents with ascending erythema and cellulitic change. She complains of pain. This is domesticated With no evidence of rabies or unusual exposures. The cat's quarantine. The patient notes no other complaints. She was given a gram of Rocephin without complicating features at [facility] and tetanus has been given as well.  She denies neck back chest abdominal pain. She is history of TIA and carotid artery surgery but nothing recent has been bothering her.  I discussed all issues with her referring physicians at [facility] and asked that she be urgently transferred to [facility] we can perform a surgical endeavor to try and afford her a restoration of function  Past Medical History  Diagnosis Date  . Carotid artery occlusion   . Hyperlipidemia   . Mitral valve prolapse   . Hypertension   . Mixed hyperlipidemia 05/22/2014  . Essential hypertension, benign 05/22/2014  . Peripheral vascular disease, unspecified 05/22/2014  . Reflux esophagitis   . Coronary atherosclerosis of native coronary artery   . Melanoma   . Stroke 2006    Mini  Stroke- not major    Past Surgical History  Procedure Laterality Date  . Carotid endarterectomy  08/21/2007    right - Dr. Amedeo Plenty  . Abdominal hysterectomy  1997  . Cholecystectomy  2000  . Innominate stent      Family History  Problem Relation Age of Onset  . Heart attack Mother   . CAD Mother   . Heart disease Mother     Before age 37  . Hypertension Mother   . Hyperlipidemia Mother   . Hypertension Father   . Pneumonia Father   . Hyperlipidemia Father   . Heart murmur Sister   . Osteoporosis Paternal  Grandmother   . CAD Paternal Grandfather     Social History:  reports that she quit smoking about 12 years ago. Her smoking use included Cigarettes. She has never used smokeless tobacco. She reports that she does not drink alcohol or use illicit drugs.  Allergies:  Allergies  Allergen Reactions  . Ciprocin-Fluocin-Procin [Fluocinolone] Nausea And Vomiting  . Demerol [Meperidine] Nausea And Vomiting  . Fentanyl Nausea And Vomiting    Pt states all pain medications cause N/V for her  . Penicillins Hives    Medications: I have reviewed the patient's current medications.  Results for orders placed or performed during the hospital encounter of 02/23/15 (from the past 48 hour(s))  Basic metabolic panel     Status: Abnormal   Collection Time: 02/23/15  8:00 AM  Result Value Ref Range   Sodium 143 135 - 145 mmol/L   Potassium 3.9 3.5 - 5.1 mmol/L   Chloride 104 101 - 111 mmol/L   CO2 26 22 - 32 mmol/L   Glucose, Bld 105 (H) 65 - 99 mg/dL   BUN 13 6 - 20 mg/dL   Creatinine, Ser 0.68 0.44 - 1.00 mg/dL   Calcium 10.4 (H) 8.9 - 10.3 mg/dL   GFR calc non Af Amer >60 >60 mL/min   GFR calc Af Amer >60 >60 mL/min    Comment: (NOTE) The eGFR has been calculated using the CKD  EPI equation. This calculation has not been validated in all clinical situations. eGFR's persistently <60 mL/min signify possible Chronic Kidney Disease.    Anion gap 13 5 - 15  CBC with Differential     Status: None   Collection Time: 02/23/15  8:00 AM  Result Value Ref Range   WBC 10.1 4.0 - 10.5 K/uL   RBC 4.94 3.87 - 5.11 MIL/uL   Hemoglobin 14.1 12.0 - 15.0 g/dL   HCT 43.5 36.0 - 46.0 %   MCV 88.1 78.0 - 100.0 fL   MCH 28.5 26.0 - 34.0 pg   MCHC 32.4 30.0 - 36.0 g/dL   RDW 13.6 11.5 - 15.5 %   Platelets 316 150 - 400 K/uL   Neutrophils Relative % 65 43 - 77 %   Neutro Abs 6.6 1.7 - 7.7 K/uL   Lymphocytes Relative 23 12 - 46 %   Lymphs Abs 2.3 0.7 - 4.0 K/uL   Monocytes Relative 10 3 - 12 %   Monocytes  Absolute 1.0 0.1 - 1.0 K/uL   Eosinophils Relative 2 0 - 5 %   Eosinophils Absolute 0.2 0.0 - 0.7 K/uL   Basophils Relative 0 0 - 1 %   Basophils Absolute 0.0 0.0 - 0.1 K/uL    Dg Hand Complete Left  02/23/2015   CLINICAL DATA:  CT bite to proximal left thumb patient now presents with pain radiating into left wrist.  EXAM: LEFT HAND - COMPLETE 3+ VIEW  COMPARISON:  None.  FINDINGS: There is no evidence of fracture or dislocation. There is no evidence of arthropathy or other focal bone abnormality. Soft tissues are unremarkable.  IMPRESSION: Negative.   Electronically Signed   By: Kerby Moors M.D.   On: 02/23/2015 10:49    Review of Systems  Constitutional: Negative.   HENT: Negative.   Eyes: Negative.   Respiratory: Negative.   Gastrointestinal: Negative.   Genitourinary: Negative.   Neurological: Negative.   Psychiatric/Behavioral: Negative.    Blood pressure 179/89, pulse 94, temperature 99 F (37.2 C), temperature source Oral, resp. rate 18, height _0  (1.651 m), weight 61.236 kg (135 lb), SpO2 100 %. Physical Exam infected cat bite left thenar space with ascending erythema and cellulitis up the arm Intact flexor and extensor function no evidence of purulent tenosynovitis The patient is alert and oriented in no acute distress. The patient complains of pain in the affected upper extremity.  The patient is noted to have a normal HEENT exam. Lung fields show equal chest expansion and no shortness of breath. Abdomen exam is nontender without distention. Lower extremity examination does not show any fracture dislocation or blood clot symptoms. Pelvis is stable and the neck and back are stable and nontender.     Assessment/Plan: Infected cat bite left thenar space-we will plan for surgical I and D. The patient has been consented.   Patient was consented for irrigation and debridement thenar space abscess.  Procedure note patient was seen in the ER she underwent a thorough  counseling. Arm was marked and the patient then underwent sterile prep and drape with alcohol and Betadine. Following this we then performed a field block with lidocaine 1% without epinephrine. She was then scrubbed with Betadine scrub and paint and tolerated this well. Following securing the sterile field we then performed irrigation debridement and evacuation of an abscess about the thenar space. Fortunately she did not have any obvious [place] signs and the abscess appeared to be in the thenar space.  This involved her muscle and the subcutaneous tissue.  Following evacuation, I made a counterincision and placed a complex drain system with Penrose drain modification being manufactured. I then irrigated a liter of saline through the area. Following this we  dressed her with Xeroform Adaptic 4 x 4's Kerlix and Coban.  she tolerated the procedure well.  She'll be continued on her doxycycline and I have added Cipro. She'll take Cipro 500 twice a day 14 days. A probiotic and a cup of yogurt each day will be used to protect her gap. In addition to this I wrote for Norco dispense #40 to be taken as directed.  We'll see her in the office tomorrow 8 AM. She understands these notes and plans.       Paulene Floor 02/23/2015, 12:54 PM

## 2015-02-23 NOTE — ED Notes (Signed)
To [place] by POV. IV secured and spouse driving patient

## 2015-02-23 NOTE — ED Provider Notes (Signed)
Patient sent from [place] for infected cat bite to see Dr Amedeo Plenty.  Pt arrived in NAD, Dr Amedeo Plenty notified.    Pt seen by Dr Amedeo Plenty.  D/C by [place], prescriptions by Dr Amedeo Plenty, to be seen in Dr Vanetta Shawl office tomorrow morning.    Clayton Bibles, PA-C 02/23/15 [place], MD 02/23/15 [phone]

## 2015-02-23 NOTE — ED Notes (Signed)
MD back at bedside for reassessment.

## 2015-02-23 NOTE — Discharge Instructions (Signed)
Read the information below.  Use the prescribed medication as directed.  Please discuss all new medications with your pharmacist.  You may return to the Emergency Department at any time for worsening condition or any new symptoms that concern you.     Please go to Dr Vanetta Shawl office tomorrow morning at 8:30am.

## 2015-02-23 NOTE — ED Provider Notes (Signed)
CSN: [id number]     Arrival date & time 02/23/15  0737 History   First MD Initiated Contact with Patient 02/23/15 0740     Chief Complaint  Patient presents with  . cat bite infection      (Consider location/radiation/quality/duration/timing/severity/associated sxs/prior Treatment) HPI Bite from the patient's family pet one day ago. The patient was seen in the emergency department yesterday for bite wound to the left hand. She was started on doxycycline having a reported penicillin allergy. The patient presents for recheck. She reports now redness and streaking up her forearm and she has pain in the forearm. No fevers or chills. Past Medical History  Diagnosis Date  . Carotid artery occlusion   . Hyperlipidemia   . Mitral valve prolapse   . Hypertension   . Mixed hyperlipidemia 05/22/2014  . Essential hypertension, benign 05/22/2014  . Peripheral vascular disease, unspecified 05/22/2014  . Reflux esophagitis   . Coronary atherosclerosis of native coronary artery   . Melanoma   . Stroke 2006    Mini  Stroke- not major   Past Surgical History  Procedure Laterality Date  . Carotid endarterectomy  08/21/2007    right - Dr. Amedeo Plenty  . Abdominal hysterectomy  1997  . Cholecystectomy  2000  . Innominate stent     Family History  Problem Relation Age of Onset  . Heart attack Mother   . CAD Mother   . Heart disease Mother     Before age 8  . Hypertension Mother   . Hyperlipidemia Mother   . Hypertension Father   . Pneumonia Father   . Hyperlipidemia Father   . Heart murmur Sister   . Osteoporosis Paternal Grandmother   . CAD Paternal Grandfather    History  Substance Use Topics  . Smoking status: Former Smoker    Types: Cigarettes    Quit date: 08/06/2002  . Smokeless tobacco: Never Used  . Alcohol Use: No   OB History    No data available     Review of Systems 10 Systems reviewed and are negative for acute change except as noted in the HPI.    Allergies    Ciprocin-fluocin-procin; Demerol; Fentanyl; and Penicillins  Home Medications   Prior to Admission medications   Medication Sig Start Date End Date Taking? Authorizing Provider  aspirin 325 MG tablet Take 325 mg by mouth daily.    Historical Provider, MD  atorvastatin (LIPITOR) 40 MG tablet Take 40 mg by mouth daily. 07/01/12   Historical Provider, MD  cholecalciferol (VITAMIN D) 1000 UNITS tablet Take 1,000 Units by mouth daily.    Historical Provider, MD  clopidogrel (PLAVIX) 75 MG tablet Take 75 mg by mouth daily. 05/26/12   Historical Provider, MD  diazepam (VALIUM) 2 MG tablet Take 2 mg by mouth daily. Take (2) 2mg  tablets daily totaling 4mg  daily 08/05/12   Historical Provider, MD  doxycycline (VIBRAMYCIN) 100 MG capsule Take 1 capsule (100 mg total) by mouth 2 (two) times daily. One po bid x 7 days 02/22/15   Carman Ching, PA-C  fexofenadine (ALLEGRA) 180 MG tablet Take 180 mg by mouth daily.    Historical Provider, MD  lisinopril (PRINIVIL,ZESTRIL) 5 MG tablet Take 5 mg by mouth daily. 07/11/12   Historical Provider, MD  Omega-3 Fatty Acids (FISH OIL) 1000 MG CAPS Take 4,000 mg by mouth daily. Takes 4 tablets per day    Historical Provider, MD  pantoprazole (PROTONIX) 40 MG tablet Take 2 tablets by mouth daily.  08/05/12   Historical Provider, MD  ranitidine (ZANTAC) 150 MG tablet Take 150 mg by mouth at bedtime.    Historical Provider, MD   BP 179/89 mmHg  Pulse 94  Temp(Src) 99 F (37.2 C) (Oral)  Resp 18  Ht 5\' 5"  (1.651 m)  Wt 135 lb (61.236 kg)  BMI 22.47 kg/m2  SpO2 100% Physical Exam  Constitutional: She is oriented to person, place, and time. She appears well-developed and well-nourished. No distress.  HENT:  Head: Normocephalic and atraumatic.  Eyes: EOM are normal.  Pulmonary/Chest: Effort normal.  Musculoskeletal:  Puncture wound to the thenar eminence. With pressure I am able to express a droplet of bloody exudate that appears to have purulence. Intact range of  motion of the thumb fingers and wrist however significant pain with movement,  particularly flexion of the thumb. Cellulitis and lymphangitis. See inserted photo  Neurological: She is alert and oriented to person, place, and time. Coordination normal.  Skin: Skin is warm and dry. She is not diaphoretic.  Psychiatric: She has a normal mood and affect.        ED Course  Procedures (including critical care time) Labs Review Labs Reviewed  BASIC METABOLIC PANEL - Abnormal; Notable for the following:    Glucose, Bld 105 (*)    Calcium 10.4 (*)    All other components within normal limits  CULTURE, BLOOD (ROUTINE X 2)  CULTURE, BLOOD (ROUTINE X 2)  CBC WITH DIFFERENTIAL/PLATELET    Imaging Review No results found.   EKG Interpretation None     Recheck at 10:10: Lymphangitic streak appears to have increased. Patient complains of pain in the forearm. Reports does not want any narcotic pain medications as it will make her sick but requests Tylenol. Skin marker applied. Consult: Dr. Amedeo Plenty, advises for the patient to be transferred directly to the emergency department strictly nothing by mouth and contacted upon her arrival for surgical management of hand infection. MDM   Final diagnoses:  Cat bite of hand, left, subsequent encounter  Lymphangitis  Animal bite of left hand with infection, subsequent encounter   Patient presents 24 hours from cat bite for recheck. She has developed lymphangitis and deep hand infection. She was treated with Rocephin and Flagyl in the emergency department. Wound was re-cleaned. Patient did not wish stronger narcotic pain control, Tylenol was administered. Consultation was made of Dr. Amedeo Plenty who advises the patient needs to be sent directly to [facility] for incision and drainage.    Charlesetta Shanks, MD 02/23/15 1051

## 2015-02-23 NOTE — ED Notes (Signed)
Went to pull lidocaine 2% out of pyxis, pyxis pocket stocked with w/incorrect item.  Pocket stocked with lidocaine 1%.  md gramig made aware, stated he would use the 1% lidocaine for procedure.  Unable to document lidocaine with scanner as product used did not match product ordered.

## 2015-02-23 NOTE — ED Notes (Signed)
Spouse at bedside

## 2015-02-23 NOTE — ED Notes (Signed)
MD at bedside. 

## 2015-02-25 LAB — WOUND CULTURE
CULTURE: NO GROWTH
SPECIAL REQUESTS: NORMAL

## 2015-03-01 LAB — CULTURE, BLOOD (ROUTINE X 2)
Culture: NO GROWTH
Culture: NO GROWTH

## 2015-04-05 ENCOUNTER — Encounter: Payer: Self-pay | Admitting: Family Medicine

## 2015-04-06 ENCOUNTER — Encounter: Payer: Self-pay | Admitting: Family Medicine

## 2015-04-06 ENCOUNTER — Ambulatory Visit (INDEPENDENT_AMBULATORY_CARE_PROVIDER_SITE_OTHER): Payer: Self-pay | Admitting: Family Medicine

## 2015-04-06 VITALS — BP 142/80 | HR 75 | Temp 97.7°F | Resp 16 | Ht 64.0 in | Wt 143.0 lb

## 2015-04-06 DIAGNOSIS — E785 Hyperlipidemia, unspecified: Secondary | ICD-10-CM

## 2015-04-06 DIAGNOSIS — I739 Peripheral vascular disease, unspecified: Secondary | ICD-10-CM

## 2015-04-06 DIAGNOSIS — R7301 Impaired fasting glucose: Secondary | ICD-10-CM

## 2015-04-06 LAB — LIPID PANEL
CHOLESTEROL: 196 mg/dL (ref 0–200)
HDL: 54.6 mg/dL (ref >39.00)
NONHDL: 141.4
Total CHOL/HDL Ratio: 4
Triglycerides: 207 mg/dL — ABNORMAL HIGH (ref 0.0–149.0)
VLDL: 41.4 mg/dL — AB (ref 0.0–40.0)

## 2015-04-06 LAB — BASIC METABOLIC PANEL
BUN: 16 mg/dL (ref 6–23)
CO2: 28 mEq/L (ref 19–32)
Calcium: 9.9 mg/dL (ref 8.4–10.5)
Chloride: 106 mEq/L (ref 96–112)
Creatinine, Ser: 0.68 mg/dL (ref 0.40–1.20)
GFR: 92.61 mL/min (ref >60.00)
GLUCOSE: 98 mg/dL (ref 70–99)
Potassium: 4.6 mEq/L (ref 3.5–5.1)
SODIUM: 143 meq/L (ref 135–145)

## 2015-04-06 LAB — HEMOGLOBIN A1C: Hgb A1c MFr Bld: 5.9 % (ref 4.6–6.5)

## 2015-04-06 LAB — LDL CHOLESTEROL, DIRECT: LDL DIRECT: 107 mg/dL

## 2015-04-06 MED ORDER — ATORVASTATIN CALCIUM 40 MG PO TABS: 40.0000 mg | ORAL_TABLET | Freq: Every day | ORAL | Status: DC

## 2015-04-06 MED ORDER — CLOPIDOGREL BISULFATE 75 MG PO TABS: 75.0000 mg | ORAL_TABLET | Freq: Every day | ORAL | Status: DC

## 2015-04-06 MED ORDER — PANTOPRAZOLE SODIUM 40 MG PO TBEC: DELAYED_RELEASE_TABLET | ORAL | Status: DC

## 2015-04-06 MED ORDER — DIAZEPAM 2 MG PO TABS: ORAL_TABLET | ORAL | Status: DC

## 2015-04-06 NOTE — Progress Notes (Signed)
Office Note 04/06/2015  CC:  Chief Complaint  Patient presents with  . Establish Care  . Follow-up    HPI:  [name] is a 64 y.o. White female who is here to establish care, asks for fasting lab work. Patient's most recent primary MD: Derenda Fennel in [place]. Old records in EPIC/HL EMR were reviewed prior to or during today's visit.  Feeling well currently. Says she was FASTING when last labs were done (listed below in labs section) at the time of her cat bit incident/ED visit.    Past Medical History  Diagnosis Date  . Carotid artery occlusion   . Hyperlipidemia   . Mitral valve prolapse   . Mixed hyperlipidemia 05/22/2014  . Essential hypertension, benign 05/22/2014  . Peripheral vascular disease, unspecified 05/22/2014    innominate and carotid  . Reflux esophagitis   . Melanoma   . Stroke 2006    Mini  Stroke- not major  . Tobacco abuse, in remission     quit 2009    Past Surgical History  Procedure Laterality Date  . Carotid endarterectomy  08/21/2007    right - Dr. Amedeo Plenty  . Abdominal hysterectomy  1997  . Cholecystectomy  2000  . Innominate stent  2007    12/2014 u/s showed patent stent in innominate and L common carotid.  L prox int carotid 40-59% stenosis  . Left common carotid stent  2006    "                    "                  "                          "                             "  . Transthoracic echocardiogram  2007    NORMAL  . Cardiac catheterization  01/2012    NORMAL  . Incision and drainage of left thenar space abscess  02/23/15    s/p cat bite (Dr. Amedeo Plenty)    Family History  Problem Relation Age of Onset  . Heart attack Mother   . CAD Mother   . Heart disease Mother     Before age 68  . Hypertension Mother   . Hyperlipidemia Mother   . Hypertension Father   . Pneumonia Father   . Hyperlipidemia Father   . Heart murmur Sister   . Osteoporosis Paternal Grandmother   . CAD Paternal Grandfather     History   Social  History  . Marital Status: Married    Spouse Name: N/A  . Number of Children: N/A  . Years of Education: N/A   Occupational History  . Not on file.   Social History Main Topics  . Smoking status: Former Smoker    Types: Cigarettes    Quit date: 08/06/2002  . Smokeless tobacco: Never Used  . Alcohol Use: No  . Drug Use: No  . Sexual Activity: Not on file   Other Topics Concern  . Not on file   Social History Narrative   Married, has one daughter.   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   Tobacco: 20 pack-yr hx, quit 2009.   Alcohol: rare.   No hx  of alc or drug problems.   Exercise: walks 2.93miles on treadmill daily.    Outpatient Encounter Prescriptions as of 04/06/2015  Medication Sig  . aspirin 325 MG tablet Take 325 mg by mouth daily.  Marland Kitchen atorvastatin (LIPITOR) 40 MG tablet Take 1 tablet (40 mg total) by mouth daily.  . cholecalciferol (VITAMIN D) 1000 UNITS tablet Take 1,000 Units by mouth daily.  . clopidogrel (PLAVIX) 75 MG tablet Take 1 tablet (75 mg total) by mouth daily.  . diazepam (VALIUM) 2 MG tablet 1 tab po bid prn  . fexofenadine (ALLEGRA) 180 MG tablet Take 180 mg by mouth daily.  Marland Kitchen lisinopril (PRINIVIL,ZESTRIL) 5 MG tablet Take 5 mg by mouth daily.  . Omega-3 Fatty Acids (FISH OIL) 1000 MG CAPS Take 4,000 mg by mouth daily. Takes 4 tablets per day  . pantoprazole (PROTONIX) 40 MG tablet 1 tab po bid  . ranitidine (ZANTAC) 150 MG tablet Take 150 mg by mouth at bedtime.  . [DISCONTINUED] atorvastatin (LIPITOR) 40 MG tablet Take 40 mg by mouth daily.  . [DISCONTINUED] clopidogrel (PLAVIX) 75 MG tablet Take 75 mg by mouth daily.  . [DISCONTINUED] diazepam (VALIUM) 2 MG tablet Take 2 mg by mouth daily. Take (2) 2mg  tablets daily totaling 4mg  daily  . [DISCONTINUED] pantoprazole (PROTONIX) 40 MG tablet Take 2 tablets by mouth daily.  . [DISCONTINUED] doxycycline (VIBRAMYCIN) 100 MG capsule Take 1 capsule (100 mg  total) by mouth 2 (two) times daily. One po bid x 7 days (Patient not taking: Reported on 04/06/2015)   No facility-administered encounter medications on file as of 04/06/2015.    Allergies  Allergen Reactions  . Ciprocin-Fluocin-Procin [Fluocinolone] Nausea And Vomiting  . Demerol [Meperidine] Nausea And Vomiting  . Fentanyl Nausea And Vomiting    Pt states all pain medications cause N/V for her  . Penicillins Hives    ROS Review of Systems  Constitutional: Negative for fever and fatigue.  HENT: Negative for congestion and sore throat.   Eyes: Negative for visual disturbance.  Respiratory: Negative for cough.   Cardiovascular: Negative for chest pain.  Gastrointestinal: Negative for nausea and abdominal pain.  Genitourinary: Negative for dysuria.  Musculoskeletal: Negative for back pain and joint swelling.  Skin: Negative for rash.  Neurological: Negative for weakness and headaches.  Hematological: Negative for adenopathy.    PE; Blood pressure 142/80, pulse 75, temperature 97.7 F (36.5 C), temperature source Oral, resp. rate 16, height 5\' 4"  (1.626 m), weight 143 lb (64.864 kg), SpO2 99 %. Gen: Alert, well appearing.  Patient is oriented to person, place, time, and situation. ATF:TDDU: no injection, icteris, swelling, or exudate.  EOMI, PERRLA. Mouth: lips without lesion/swelling.  Oral mucosa pink and moist. Oropharynx without erythema, exudate, or swelling.  NECK: no bruits CV: RRR, no m/r/g.   LUNGS: CTA bilat, nonlabored resps, good aeration in all lung fields. EXT: no clubbing, cyanosis, or edema.   Pertinent labs:    Chemistry      Component Value Date/Time   NA 143 02/23/2015 0800   K 3.9 02/23/2015 0800   CL 104 02/23/2015 0800   CO2 26 02/23/2015 0800   BUN 13 02/23/2015 0800   CREATININE 0.68 02/23/2015 0800      Component Value Date/Time   CALCIUM 10.4* 02/23/2015 0800   ALKPHOS 108 08/17/2007 1540   AST 26 08/17/2007 1540   ALT 27 08/17/2007 1540    BILITOT 1.5* 08/17/2007 1540     Lab Results  Component Value Date  WBC 10.1 02/23/2015   HGB 14.1 02/23/2015   HCT 43.5 02/23/2015   MCV 88.1 02/23/2015   PLT 316 02/23/2015   Glucose on 02/23/15 when fasting was 105 ASSESSMENT AND PLAN:   New pt; obtain old records.  1) Perpheral vascular disease: all stable.  Continue appropriate routine f/u with vascular surgery. Signs/symptoms to call or return for were reviewed and pt expressed understanding.  2) HTN; The current medical regimen is effective;  continue present plan and medications. Pt reports home bps consistently 120s over 70s. She takes 1/2 of a 10mg  lisinopril tab qd.  3) Hyperlipidemia: FLP today. Tolerating statin. AST and ALT recently normal.  4) Impaired fasting glucose: repeat fasting glucose today and get HbA1c to further assess.  An After Visit Summary was printed and given to the patient.  Return in about 4 months (around 08/07/2015) for routine chronic illness f/u.

## 2015-04-07 ENCOUNTER — Other Ambulatory Visit: Payer: Self-pay | Admitting: Family Medicine

## 2015-04-07 MED ORDER — ATORVASTATIN CALCIUM 80 MG PO TABS: 80.0000 mg | ORAL_TABLET | Freq: Every day | ORAL | Status: DC

## 2015-06-03 ENCOUNTER — Ambulatory Visit (INDEPENDENT_AMBULATORY_CARE_PROVIDER_SITE_OTHER): Payer: Self-pay | Admitting: Interventional Cardiology

## 2015-06-03 ENCOUNTER — Encounter: Payer: Self-pay | Admitting: Interventional Cardiology

## 2015-06-03 VITALS — BP 126/74 | HR 71 | Ht 65.0 in | Wt 139.4 lb

## 2015-06-03 DIAGNOSIS — I739 Peripheral vascular disease, unspecified: Secondary | ICD-10-CM

## 2015-06-03 DIAGNOSIS — E782 Mixed hyperlipidemia: Secondary | ICD-10-CM

## 2015-06-03 DIAGNOSIS — I10 Essential (primary) hypertension: Secondary | ICD-10-CM

## 2015-06-03 DIAGNOSIS — I779 Disorder of arteries and arterioles, unspecified: Secondary | ICD-10-CM

## 2015-06-03 MED ORDER — ASPIRIN EC 81 MG PO TBEC: 81.0000 mg | DELAYED_RELEASE_TABLET | Freq: Every day | ORAL | Status: DC

## 2015-06-03 MED ORDER — EZETIMIBE 10 MG PO TABS: 10.0000 mg | ORAL_TABLET | Freq: Every day | ORAL | Status: DC

## 2015-06-03 NOTE — Progress Notes (Signed)
Patient ID: [name], female   DOB: 1951/05/25, 64 y.o.   MRN: [id number]     Cardiology Office Note   Date:  06/03/2015   ID:  [name], DOB [birth year], MRN [id number]  PCP:  Tammi Sou, MD    No chief complaint on file.  hyperlipidemia   Wt Readings from Last 3 Encounters:  06/03/15 139 lb 6.4 oz (63.231 kg)  04/06/15 143 lb (64.864 kg)  02/23/15 135 lb (61.236 kg)       History of Present Illness: [name] is a 64 y.o. female  with a family history of significant coronary artery disease. She has a history of early vascular disease and difficult to control hyperlipidemia.  She has had significant carotid disease in the past with stents placed in the innominate artery and common carotid artery. She had a cardiac cath in 2013 showing no significant coronary artery disease and only minimal coronary calcification.   I have not seen her in several years. Overall, she had been doing well. She denied any chest discomfort. She has had negative stress tests in the past. She had a routine lipid check and her LDL had increased significantly despite her eating a healthy diet. Of note, her husband had a heart attack recently and they have changed their diet at home for the better. They're eating very healthy and she is walking several miles daily before work. Despite these lifestyle modifications, her LDL increased.  She is here for follow-up.    Past Medical History  Diagnosis Date  . Carotid artery occlusion   . Hyperlipidemia   . Mitral valve prolapse   . Mixed hyperlipidemia 05/22/2014  . Essential hypertension, benign 05/22/2014  . Peripheral vascular disease, unspecified 05/22/2014    innominate and carotid  . Reflux esophagitis   . Melanoma   . Stroke 2006    Mini  Stroke- not major  . Tobacco abuse, in remission     quit 2009    Past Surgical History  Procedure Laterality Date  . Carotid endarterectomy  08/21/2007    right - Dr. Amedeo Plenty  . Abdominal  hysterectomy  1997  . Cholecystectomy  2000  . Innominate stent  2007    12/2014 u/s showed patent stent in innominate and L common carotid.  L prox int carotid 40-59% stenosis  . Left common carotid stent  2006    "                    "                  "                          "                             "  . Transthoracic echocardiogram  2007    NORMAL  . Cardiac catheterization  01/2012    NORMAL  . Incision and drainage of left thenar space abscess  02/23/15    s/p cat bite (Dr. Amedeo Plenty)     Current Outpatient Prescriptions  Medication Sig Dispense Refill  . aspirin 325 MG tablet Take 325 mg by mouth daily.    Marland Kitchen atorvastatin (LIPITOR) 80 MG tablet Take 1 tablet (80 mg total) by mouth daily. 30 tablet 6  . cholecalciferol (VITAMIN D) 1000 UNITS tablet Take  1,000 Units by mouth daily.    . clopidogrel (PLAVIX) 75 MG tablet Take 1 tablet (75 mg total) by mouth daily. 30 tablet 6  . diazepam (VALIUM) 2 MG tablet Take 2 mg by mouth 2 (two) times daily as needed for anxiety.    . fexofenadine (ALLEGRA) 180 MG tablet Take 180 mg by mouth daily as needed for allergies or rhinitis.    Marland Kitchen lisinopril (PRINIVIL,ZESTRIL) 5 MG tablet Take 5 mg by mouth daily.    . Omega-3 Fatty Acids (FISH OIL) 1000 MG CAPS Take 1,000 mg by mouth daily.    . pantoprazole (PROTONIX) 40 MG tablet Take 40 mg by mouth 2 (two) times daily.    . ranitidine (ZANTAC) 150 MG tablet Take 150 mg by mouth at bedtime.     No current facility-administered medications for this visit.    Allergies:   Ciprocin-fluocin-procin; Demerol; Fentanyl; and Penicillins    Social History:  The patient  reports that she quit smoking about 12 years ago. Her smoking use included Cigarettes. She has never used smokeless tobacco. She reports that she does not drink alcohol or use illicit drugs.   Family History:  The patient's family history includes CAD in her mother and paternal grandfather; Heart attack in her mother; Heart disease in  her mother; Heart murmur in her sister; Hyperlipidemia in her father and mother; Hypertension in her father and mother; Osteoporosis in her paternal grandmother; Pneumonia in her father.    ROS:  Please see the history of present illness.   Otherwise, review of systems are positive for left shoulder pain.   All other systems are reviewed and negative.    PHYSICAL EXAM: VS:  BP 126/74 mmHg  Pulse 71  Ht 5\' 5"  (1.651 m)  Wt 139 lb 6.4 oz (63.231 kg)  BMI 23.20 kg/m2  SpO2 99% , BMI Body mass index is 23.2 kg/(m^2). GEN: Well nourished, well developed, in no acute distress HEENT: normal Neck: no JVD, carotid bruits, or masses Cardiac: RRR; no murmurs, rubs, or gallops,no edema  Respiratory:  clear to auscultation bilaterally, normal work of breathing GI: soft, nontender, nondistended, + BS MS: no deformity or atrophy Skin: warm and dry, no rash Neuro:  Strength and sensation are intact Psych: euthymic mood, full affect   EKG:   The ekg ordered today demonstrates normal ECG   Recent Labs: 02/23/2015: Hemoglobin 14.1; Platelets 316 04/06/2015: BUN 16; Creatinine, Ser 0.68; Potassium 4.6; Sodium 143   Lipid Panel    Component Value Date/Time   CHOL 196 04/06/2015 0859   TRIG 207.0* 04/06/2015 0859   HDL 54.60 04/06/2015 0859   CHOLHDL 4 04/06/2015 0859   VLDL 41.4* 04/06/2015 0859   LDLDIRECT 107.0 04/06/2015 0859     Other studies Reviewed: Additional studies/ records that were reviewed today with results demonstrating: Cardiac cath reviewed..   ASSESSMENT AND PLAN:  1. Hyperlipidemia: Will add Zetia 10 mg daily to her atorvastatin. Recheck lipids with her primary care doctor in a few months. Try to keep her LDL less than 100 given her peripheral vascular disease.  If this area does not work, would have to consider PCS K9 inhibitor. 2. Left shoulder pain: I think this is more orthopedic. She can push on the spot with relief of the pain. It correlates to where her apron  sits that she wears for work. 3. Peripheral arterial disease: No signs of recurrent disease at this point. Continue to try to control LDL and lifestyle to minimize  disease progression. Decrease aspirin dose from 325 mg to 81 mg. Continue clopidogrel.   Current medicines are reviewed at length with the patient today.  The patient concerns regarding her medicines were addressed.  The following changes have been made: As above  Labs/ tests ordered today include:  No orders of the defined types were placed in this encounter.    Recommend 150 minutes/week of aerobic exercise Low fat, low carb, high fiber diet recommended  Disposition:   FU in one year   Teresita Madura., MD  06/03/2015 8:39 AM    [facility] [address], [place], [place]  [address] Phone: [phone]; Fax: [phone]

## 2015-06-03 NOTE — Patient Instructions (Signed)
Medication Instructions:  Start taking Zetia 10 mg daily and decrease your Aspirin to 81 mg daily  Labwork: None  Testing/Procedures: None  Follow-Up: Your physician wants you to follow-up in: 1 year.  You will receive a reminder letter in the mail two months in advance. If you don't receive a letter, please call our office to schedule the follow-up appointment.

## 2015-06-29 ENCOUNTER — Other Ambulatory Visit: Payer: Self-pay | Admitting: *Deleted

## 2015-06-29 MED ORDER — LISINOPRIL 5 MG PO TABS: 5.0000 mg | ORAL_TABLET | Freq: Every day | ORAL | Status: DC

## 2015-06-29 NOTE — Telephone Encounter (Signed)
Pt called requesting refill for Lisinopril LOV: 04/06/15 NOV: 08/10/15 Last written: unknown

## 2015-07-28 ENCOUNTER — Encounter: Payer: Self-pay | Admitting: Family Medicine

## 2015-08-10 ENCOUNTER — Ambulatory Visit (INDEPENDENT_AMBULATORY_CARE_PROVIDER_SITE_OTHER): Payer: Self-pay | Admitting: Family Medicine

## 2015-08-10 ENCOUNTER — Encounter: Payer: Self-pay | Admitting: Family Medicine

## 2015-08-10 VITALS — BP 119/81 | HR 79 | Temp 98.1°F | Resp 16 | Ht 65.0 in | Wt 140.0 lb

## 2015-08-10 DIAGNOSIS — I739 Peripheral vascular disease, unspecified: Secondary | ICD-10-CM

## 2015-08-10 DIAGNOSIS — E785 Hyperlipidemia, unspecified: Secondary | ICD-10-CM

## 2015-08-10 DIAGNOSIS — I10 Essential (primary) hypertension: Secondary | ICD-10-CM

## 2015-08-10 LAB — LIPID PANEL
Cholesterol: 159 mg/dL (ref 0–200)
HDL: 49.1 mg/dL (ref >39.00)
LDL CALC: 80 mg/dL (ref 0–99)
NONHDL: 110.05
Total CHOL/HDL Ratio: 3
Triglycerides: 150 mg/dL — ABNORMAL HIGH (ref 0.0–149.0)
VLDL: 30 mg/dL (ref 0.0–40.0)

## 2015-08-10 NOTE — Progress Notes (Signed)
OFFICE VISIT  08/10/2015   CC:  Chief Complaint  Patient presents with  . Follow-up    Pt is fasting. Pt is selfpay.    HPI:    Patient is a 64 y.o. Caucasian female who presents for 4 mo f/u HTN, HLD, PVD. Last visit lipids still elevated so I increased her lipitor to 80mg  qd. Her cardiologist added zetia 10mg  qd about a month ago but pt not taking due to cost at this time.  He decreased her ASA to 81mg  qd and continued plavix 75 qd. She is trying hard with diet.  Walks three times per week x 30 min on treadmill. Still works part time as a Educational psychologist at [employer].  ROS: has rare orthostatic dizziness.  No melena   Past Medical History  Diagnosis Date  . Carotid artery occlusion   . Mitral valve prolapse   . Mixed hyperlipidemia 05/22/2014  . Essential hypertension, benign 05/22/2014  . Peripheral vascular disease, unspecified ([place]) 05/22/2014    innominate and carotid  . Reflux esophagitis   . Melanoma ([place])   . Stroke [facility]) 2006    Mini  Stroke- not major  . Tobacco abuse, in remission     quit 2009  . Anxiety     Past Surgical History  Procedure Laterality Date  . Carotid endarterectomy  08/21/2007    right - Dr. Amedeo Plenty  . Abdominal hysterectomy  1997  . Cholecystectomy  2000  . Innominate stent  2007    12/2014 u/s showed patent stent in innominate and L common carotid.  L prox int carotid 40-59% stenosis  . Left common carotid stent  2006    "                    "                  "                          "                             "  . Transthoracic echocardiogram  2007    NORMAL  . Cardiac catheterization  01/2012    NORMAL  . Incision and drainage of left thenar space abscess  02/23/15    s/p cat bite (Dr. Amedeo Plenty)    Outpatient Prescriptions Prior to Visit  Medication Sig Dispense Refill  . aspirin EC 81 MG tablet Take 1 tablet (81 mg total) by mouth daily. 90 tablet 3  . atorvastatin (LIPITOR) 80 MG tablet Take 1 tablet (80 mg total) by mouth daily. 30  tablet 6  . cholecalciferol (VITAMIN D) 1000 UNITS tablet Take 1,000 Units by mouth daily.    . clopidogrel (PLAVIX) 75 MG tablet Take 1 tablet (75 mg total) by mouth daily. 30 tablet 6  . diazepam (VALIUM) 2 MG tablet Take 2 mg by mouth 2 (two) times daily as needed for anxiety.    . fexofenadine (ALLEGRA) 180 MG tablet Take 180 mg by mouth daily as needed for allergies or rhinitis.    Marland Kitchen lisinopril (PRINIVIL,ZESTRIL) 5 MG tablet Take 1 tablet (5 mg total) by mouth daily. 30 tablet 11  . Omega-3 Fatty Acids (FISH OIL) 1000 MG CAPS Take 1,000 mg by mouth daily.    . pantoprazole (PROTONIX) 40 MG tablet Take 40 mg by mouth 2 (  two) times daily.    . ranitidine (ZANTAC) 150 MG tablet Take 150 mg by mouth at bedtime.    Marland Kitchen ezetimibe (ZETIA) 10 MG tablet Take 1 tablet (10 mg total) by mouth daily. (Patient not taking: Reported on 08/10/2015) 30 tablet 11   No facility-administered medications prior to visit.    Allergies  Allergen Reactions  . Ciprocin-Fluocin-Procin [Fluocinolone] Nausea And Vomiting  . Demerol [Meperidine] Nausea And Vomiting  . Fentanyl Nausea And Vomiting    Pt states all pain medications cause N/V for her  . Penicillins Hives    ROS As per HPI  PE: Blood pressure 119/81, pulse 79, temperature 98.1 F (36.7 C), temperature source Oral, resp. rate 16, height 5\' 5"  (1.651 m), weight 140 lb (63.504 kg), SpO2 100 %. [id number]: no injection, icteris, swelling, or exudate.  EOMI, PERRLA. Mouth: lips without lesion/swelling.  Oral mucosa pink and moist. Oropharynx without erythema, exudate, or swelling.  CV: RRR, no m/r/g.   LUNGS: CTA bilat, nonlabored resps, good aeration in all lung fields. EXT: no clubbing, cyanosis, or edema.    LABS:  No results found for: TSH Lab Results  Component Value Date   WBC 10.1 02/23/2015   HGB 14.1 02/23/2015   HCT 43.5 02/23/2015   MCV 88.1 02/23/2015   PLT 316 02/23/2015   Lab Results  Component Value Date   CREATININE 0.68  04/06/2015   BUN 16 04/06/2015   NA 143 04/06/2015   K 4.6 04/06/2015   CL 106 04/06/2015   CO2 28 04/06/2015   Lab Results  Component Value Date   ALT 27 08/17/2007   AST 26 08/17/2007   ALKPHOS 108 08/17/2007   BILITOT 1.5* 08/17/2007   Lab Results  Component Value Date   CHOL 196 04/06/2015   Lab Results  Component Value Date   HDL 54.60 04/06/2015   No results found for: [facility] Lab Results  Component Value Date   TRIG 207.0* 04/06/2015   Lab Results  Component Value Date   CHOLHDL 4 04/06/2015   Lab Results  Component Value Date   HGBA1C 5.9 04/06/2015     IMPRESSION AND PLAN:  1) Periph vasc dz: stable. RF mgmt ongoing.  2) HTN: The current medical regimen is effective;  continue present plan and medications. Lytes/cr good 4 mo ago.  3) Hyperlipidemia: goal LDL <70, trigs a bit up last check. We'll see what her FLP looks like today after having been on max lipitor dosing x 3 mo or so. Of note, she can't afford zetia at this time but we'll retry this when it goes generic in the upcoming year.  Flu vaccine today.  FOLLOW UP: Return in about 6 months (around 02/07/2016) for annual CPE (fasting).

## 2015-08-10 NOTE — Progress Notes (Signed)
Pre visit review using our clinic review tool, if applicable. No additional management support is needed unless otherwise documented below in the visit note. 

## 2015-09-10 ENCOUNTER — Other Ambulatory Visit: Payer: Self-pay | Admitting: Family Medicine

## 2015-09-10 NOTE — Telephone Encounter (Signed)
RF request for diazepam LOV: 08/10/15 Next ov:  05/10/16 Last written: unknown  Please advise. Thanks.

## 2015-09-11 NOTE — Telephone Encounter (Signed)
Rx faxed

## 2015-10-13 ENCOUNTER — Other Ambulatory Visit: Payer: Self-pay | Admitting: Family Medicine

## 2015-10-14 NOTE — Telephone Encounter (Signed)
LOV: 08/10/15 NOV: 05/10/16  RF request for clopidogrel Last written: 04/06/15 #30 w/ 6RF  RF request for pantoprazole Last written: 04/06/15 #60 w/ 6RF

## 2015-10-22 ENCOUNTER — Other Ambulatory Visit: Payer: Self-pay | Admitting: Family Medicine

## 2015-12-25 ENCOUNTER — Encounter: Payer: Self-pay | Admitting: Family

## 2016-01-04 ENCOUNTER — Other Ambulatory Visit: Payer: Self-pay | Admitting: Family

## 2016-01-04 ENCOUNTER — Ambulatory Visit (HOSPITAL_COMMUNITY)
Admission: RE | Admit: 2016-01-04 | Discharge: 2016-01-04 | Disposition: A | Discharge status: Home / Self Care | Payer: Self-pay | Source: Ambulatory Visit | Attending: Family | Admitting: Family

## 2016-01-04 ENCOUNTER — Ambulatory Visit (INDEPENDENT_AMBULATORY_CARE_PROVIDER_SITE_OTHER): Payer: Self-pay | Admitting: Family

## 2016-01-04 ENCOUNTER — Encounter: Payer: Self-pay | Admitting: Family

## 2016-01-04 VITALS — BP 124/74 | HR 70 | Ht 65.0 in | Wt 135.0 lb

## 2016-01-04 DIAGNOSIS — Z959 Presence of cardiac and vascular implant and graft, unspecified: Secondary | ICD-10-CM

## 2016-01-04 DIAGNOSIS — I6521 Occlusion and stenosis of right carotid artery: Secondary | ICD-10-CM | POA: Insufficient documentation

## 2016-01-04 DIAGNOSIS — I6523 Occlusion and stenosis of bilateral carotid arteries: Secondary | ICD-10-CM

## 2016-01-04 DIAGNOSIS — Z48812 Encounter for surgical aftercare following surgery on the circulatory system: Secondary | ICD-10-CM | POA: Insufficient documentation

## 2016-01-04 DIAGNOSIS — Z9889 Other specified postprocedural states: Secondary | ICD-10-CM

## 2016-01-04 DIAGNOSIS — E782 Mixed hyperlipidemia: Secondary | ICD-10-CM | POA: Insufficient documentation

## 2016-01-04 DIAGNOSIS — Z87891 Personal history of nicotine dependence: Secondary | ICD-10-CM | POA: Insufficient documentation

## 2016-01-04 DIAGNOSIS — I10 Essential (primary) hypertension: Secondary | ICD-10-CM | POA: Insufficient documentation

## 2016-01-04 NOTE — Progress Notes (Signed)
Chief Complaint: Extracranial Carotid Artery Stenosis   History of Present Illness  [name] is a 65 y.o. female patient of Dr. Trula Slade with a history of a right carotid endarterectomy on 08/21/2007. She has had an innominate artery stent placed on 06/23/2006, and a left common carotid artery stent on 03/31/2005; all of these procedures were performed by Dr. Amedeo Plenty. She returns today for follow up.  CT angiogram of neck and chest on 08/06/12 requested by Dr. Trula Slade: 1. Patent short segment stents at the brachial cephalic artery and  left common carotid artery origins.  2. Interval right carotid endarterectomy. Suggestion of ulcerated  plaque in the right CCA at the level of the vocal cords. No  cervical right carotid stenosis.  3. Mild soft plaque in the left CCA. Bulky plaque at the left ICA  origin and proximal bulb with short segment stenosis of up to 60-  65%.  4. Right subclavian artery origin stenosis appears related to soft  plaque and measures up to 65%.  5. Normal vertebral arteries.  1. Wide patency of both the right innominate and left common  carotid artery stents without hemodynamically significant stenosis.  2. Normal caliber of the thoracic aorta without evidence of  dissection.  3. Extensive centrilobular emphysema.  Patient was not having any symptoms in either arm until Summer of 2014 when she injured her left shoulder, and now is unable to lift left arm above shoulder height; I advised her to see her PCP for guidance to address what seems to be symptoms of rotator cuff injury; denies tingling or numbness in either hand.  Patient has a history of a TIA in 2006 but denies any symptoms, states hs had no sx's other than dizziness, was told that she had TIA's by results of CT. The patient denies amaurosis fugax or monocular blindness. The patient Reports mild facial drooping of the left side of face only with smiling. Pt. denies hemiplegia.  The patient denies receptive or expressive aphasia.  She denies claudication symptoms in legs.  Patient denies New Medical or Surgical History. She walks 2 miles daily, is diligent about eating healthy.  Pt Diabetic: No Pt smoker: former smoker, quit in 2006  Pt meds include: Statin : Yes ASA: Yes Other anticoagulants/antiplatelets: Plavix  Past Medical History  Diagnosis Date  . Carotid artery occlusion   . Mitral valve prolapse   . Mixed hyperlipidemia 05/22/2014  . Essential hypertension, benign 05/22/2014  . Peripheral vascular disease, unspecified ([place]) 05/22/2014    innominate and carotid  . Reflux esophagitis   . Melanoma ([place])   . Stroke [facility]) 2006    Mini  Stroke- not major  . Tobacco abuse, in remission     quit 2009  . Anxiety     Social History Social History  Substance Use Topics  . Smoking status: Former Smoker    Types: Cigarettes    Quit date: 08/06/2002  . Smokeless tobacco: Never Used  . Alcohol Use: No    Family History Family History  Problem Relation Age of Onset  . Heart attack Mother   . CAD Mother   . Heart disease Mother     Before age 5  . Hypertension Mother   . Hyperlipidemia Mother   . Hypertension Father   . Pneumonia Father   . Hyperlipidemia Father   . Heart murmur Sister   . Osteoporosis Paternal Grandmother   . CAD Paternal Grandfather     Surgical History Past Surgical History  Procedure Laterality Date  . Carotid endarterectomy  08/21/2007    right - Dr. Amedeo Plenty  . Abdominal hysterectomy  1997  . Cholecystectomy  2000  . Innominate stent  2007    12/2014 u/s showed patent stent in innominate and L common carotid.  L prox int carotid 40-59% stenosis  . Left common carotid stent  2006    "                    "                  "                          "                             "  . Transthoracic echocardiogram  2007    NORMAL  . Cardiac catheterization  01/2012    NORMAL  . Incision and drainage of left  thenar space abscess  02/23/15    s/p cat bite (Dr. Amedeo Plenty)    Allergies  Allergen Reactions  . Ciprocin-Fluocin-Procin [Fluocinolone] Nausea And Vomiting  . Demerol [Meperidine] Nausea And Vomiting  . Fentanyl Nausea And Vomiting    Pt states all pain medications cause N/V for her  . Penicillins Hives    Current Outpatient Prescriptions  Medication Sig Dispense Refill  . aspirin EC 81 MG tablet Take 1 tablet (81 mg total) by mouth daily. 90 tablet 3  . atorvastatin (LIPITOR) 80 MG tablet TAKE 1 TABLET BY MOUTH EVERY DAY 30 tablet 2  . cholecalciferol (VITAMIN D) 1000 UNITS tablet Take 1,000 Units by mouth daily.    . clopidogrel (PLAVIX) 75 MG tablet Take 1 tablet (75 mg total) by mouth daily. 30 tablet 11  . diazepam (VALIUM) 2 MG tablet TAKE 1 TABLET BY MOUTH TWICE DAILY AS NEEDED 60 tablet 5  . fexofenadine (ALLEGRA) 180 MG tablet Take 180 mg by mouth daily as needed for allergies or rhinitis.    Marland Kitchen lisinopril (PRINIVIL,ZESTRIL) 5 MG tablet Take 1 tablet (5 mg total) by mouth daily. 30 tablet 11  . Omega-3 Fatty Acids (FISH OIL) 1000 MG CAPS Take 1,000 mg by mouth daily.    . pantoprazole (PROTONIX) 40 MG tablet Take 40 mg by mouth 2 (two) times daily.    . pantoprazole (PROTONIX) 40 MG tablet TAKE 1 TABLET BY MOUTH TWICE DAILY 60 tablet 11  . ranitidine (ZANTAC) 150 MG tablet Take 150 mg by mouth at bedtime.     No current facility-administered medications for this visit.    Review of Systems : See HPI for pertinent positives and negatives.  Physical Examination  Filed Vitals:   01/04/16 1158 01/04/16 1200  BP: 119/75 124/74  Pulse: 70   Height: 5\' 5"  (1.651 m)   Weight: 135 lb (61.236 kg)   SpO2: 99%    Body mass index is 22.47 kg/(m^2).  General: WDWN female in NAD GAIT: normal Eyes: PERRLA, right eyelid droop Pulmonary: Non labored respirations, CTAB Cardiac: regular rhythm, no detected murmur  VASCULAR EXAM Carotid Bruits Left Right   Positive,  soft Positive, moderate   Radial pulses: 2+ palpable and = Brachial pulses: 3+ bilaterally      Gastrointestinal: soft, nontender, BS WNL, no r/g, no palpable masses.  Musculoskeletal: No muscle atrophy/wasting. M/S 5/5 throughout, Extremities without ischemic changes.  Neurologic: A&O X 3; Appropriate Affect, Speech is normal CN 2-12 intact Except slight right facial droop only with smile, tongue is midline, Pain and light touch intact in extremities, Motor exam as listed above.               Non-Invasive Vascular Imaging CAROTID DUPLEX 01/04/2016   Innominate stent velocities: Distal: 286 cm/s, Exit: 262 cm/s; Outflow: 251 CM/S. Proximal and mid stent could not be visualized due to anatomy; distal waveforms are WNL. Left CCA stent could not be visualized due to anatomy. Proximal CCA velocities are elevated and waveforms are turbulent which may indicate proximal stenosis.  Widely patent right CEA with no restenosis.  40-59% left ICA stenosis. Patent innominate artery stent with no stenosis. Right subclavian artery disease. Possible stenosis of the left CCA stent based on distal waveforms. Unable to visualize stent. No change in bilateral ICA stenosis. Velocities in left CCA have increased.   Assessment: [name] is a 65 y.o. female who is s/p right carotid endarterectomy on 08/21/07; Innominate artery stent on 06/23/06; Left common carotid artery stent on 03/31/05. Patient has a history of a TIA in 2006 but denies any symptoms. She walks 2 miles daily, is diligent about eating healthy.   Today's carotid duplex suggests widely patent right CEA with no restenosis. 40-59% left ICA stenosis. Patent innominate artery stent with no stenosis. Right subclavian artery disease. Possible stenosis of the left CCA stent based on distal waveforms.  Unable to visualize stent. No change in bilateral ICA stenosis. Velocities in left CCA have increased.  Plan: Follow-up in 1 year with Carotid Duplex scan.   I discussed in depth with the patient the nature of atherosclerosis, and emphasized the importance of maximal medical management including strict control of blood pressure, blood glucose, and lipid levels, obtaining regular exercise, and continued cessation of smoking.  The patient is aware that without maximal medical management the underlying atherosclerotic disease process will progress, limiting the benefit of any interventions. The patient was given information about stroke prevention and what symptoms should prompt the patient to seek immediate medical care. Thank you for allowing [place] to participate in this patient's care.  Clemon Chambers, RN, MSN, FNP-C [facility] Office: [phone]  Clinic Physician: Kellie Simmering  01/04/2016 12:11 PM

## 2016-01-04 NOTE — Patient Instructions (Signed)
Stroke Prevention Some medical conditions and behaviors are associated with an increased chance of having a stroke. You may prevent a stroke by making healthy choices and managing medical conditions. HOW CAN I REDUCE MY RISK OF HAVING A STROKE?   Stay physically active. Get at least 30 minutes of activity on most or all days.  Do not smoke. It may also be helpful to avoid exposure to secondhand smoke.  Limit alcohol use. Moderate alcohol use is considered to be:  No more than 2 drinks per day for men.  No more than 1 drink per day for nonpregnant women.  Eat healthy foods. This involves:  Eating 5 or more servings of fruits and vegetables a day.  Making dietary changes that address high blood pressure (hypertension), high cholesterol, diabetes, or obesity.  Manage your cholesterol levels.  Making food choices that are high in fiber and low in saturated fat, trans fat, and cholesterol may control cholesterol levels.  Take any prescribed medicines to control cholesterol as directed by your health care provider.  Manage your diabetes.  Controlling your carbohydrate and sugar intake is recommended to manage diabetes.  Take any prescribed medicines to control diabetes as directed by your health care provider.  Control your hypertension.  Making food choices that are low in salt (sodium), saturated fat, trans fat, and cholesterol is recommended to manage hypertension.  Ask your health care provider if you need treatment to lower your blood pressure. Take any prescribed medicines to control hypertension as directed by your health care provider.  If you are 18-39 years of age, have your blood pressure checked every 3-5 years. If you are 40 years of age or older, have your blood pressure checked every year.  Maintain a healthy weight.  Reducing calorie intake and making food choices that are low in sodium, saturated fat, trans fat, and cholesterol are recommended to manage  weight.  Stop drug abuse.  Avoid taking birth control pills.  Talk to your health care provider about the risks of taking birth control pills if you are over 35 years old, smoke, get migraines, or have ever had a blood clot.  Get evaluated for sleep disorders (sleep apnea).  Talk to your health care provider about getting a sleep evaluation if you snore a lot or have excessive sleepiness.  Take medicines only as directed by your health care provider.  For some people, aspirin or blood thinners (anticoagulants) are helpful in reducing the risk of forming abnormal blood clots that can lead to stroke. If you have the irregular heart rhythm of atrial fibrillation, you should be on a blood thinner unless there is a good reason you cannot take them.  Understand all your medicine instructions.  Make sure that other conditions (such as anemia or atherosclerosis) are addressed. SEEK IMMEDIATE MEDICAL CARE IF:   You have sudden weakness or numbness of the face, arm, or leg, especially on one side of the body.  Your face or eyelid droops to one side.  You have sudden confusion.  You have trouble speaking (aphasia) or understanding.  You have sudden trouble seeing in one or both eyes.  You have sudden trouble walking.  You have dizziness.  You have a loss of balance or coordination.  You have a sudden, severe headache with no known cause.  You have new chest pain or an irregular heartbeat. Any of these symptoms may represent a serious problem that is an emergency. Do not wait to see if the symptoms will   go away. Get medical help at once. Call your local emergency services (911 in [place] Do not drive yourself to the hospital.   This information is not intended to replace advice given to you by your health care provider. Make sure you discuss any questions you have with your health care provider.   Document Released: 10/20/2004 Document Revised: 10/03/2014 Document Reviewed:  03/15/2013 Elsevier Interactive Patient Education 2016 Elsevier Inc.  

## 2016-02-03 ENCOUNTER — Other Ambulatory Visit: Payer: Self-pay | Admitting: Family Medicine

## 2016-02-04 NOTE — Addendum Note (Signed)
Addended by: Dorothyann Gibbs on: 02/04/2016 08:53 AM   Modules accepted: Orders

## 2016-03-10 ENCOUNTER — Other Ambulatory Visit: Payer: Self-pay | Admitting: Family Medicine

## 2016-03-10 NOTE — Telephone Encounter (Signed)
RF request for diazepam LOV: 08/10/15 Next ov: 05/23/16 Last written: 09/11/15 #60 w/ 5RF  Please advise. Thanks.

## 2016-03-10 NOTE — Telephone Encounter (Signed)
Rx faxed

## 2016-04-11 ENCOUNTER — Other Ambulatory Visit: Payer: Self-pay | Admitting: Family Medicine

## 2016-04-11 NOTE — Telephone Encounter (Signed)
RF request for atorvastatin LOV: 08/10/15 Next ov: 05/23/16 Last written: 02/03/16 #30 w/ 1RF

## 2016-04-13 ENCOUNTER — Other Ambulatory Visit: Payer: Self-pay | Admitting: Family Medicine

## 2016-05-10 ENCOUNTER — Encounter: Payer: Self-pay | Admitting: Family Medicine

## 2016-05-23 ENCOUNTER — Encounter: Payer: Self-pay | Admitting: Family Medicine

## 2016-05-23 ENCOUNTER — Ambulatory Visit (INDEPENDENT_AMBULATORY_CARE_PROVIDER_SITE_OTHER): Payer: PPO | Admitting: Family Medicine

## 2016-05-23 VITALS — BP 153/77 | HR 68 | Temp 97.7°F | Resp 16 | Ht 64.0 in | Wt 137.0 lb

## 2016-05-23 DIAGNOSIS — Z0000 Encounter for general adult medical examination without abnormal findings: Secondary | ICD-10-CM | POA: Diagnosis not present

## 2016-05-23 DIAGNOSIS — E2839 Other primary ovarian failure: Secondary | ICD-10-CM

## 2016-05-23 DIAGNOSIS — Z1239 Encounter for other screening for malignant neoplasm of breast: Secondary | ICD-10-CM

## 2016-05-23 DIAGNOSIS — Z23 Encounter for immunization: Secondary | ICD-10-CM | POA: Diagnosis not present

## 2016-05-23 LAB — CBC WITH DIFFERENTIAL/PLATELET
BASOS ABS: 0 10*3/uL (ref 0.0–0.1)
BASOS PCT: 0.4 % (ref 0.0–3.0)
Eosinophils Absolute: 0.2 10*3/uL (ref 0.0–0.7)
Eosinophils Relative: 3 % (ref 0.0–5.0)
HEMATOCRIT: 41.5 % (ref 36.0–46.0)
Hemoglobin: 13.7 g/dL (ref 12.0–15.0)
LYMPHS ABS: 2.5 10*3/uL (ref 0.7–4.0)
Lymphocytes Relative: 36.9 % (ref 12.0–46.0)
MCHC: 32.9 g/dL (ref 30.0–36.0)
MCV: 85.8 fl (ref 78.0–100.0)
MONOS PCT: 10.3 % (ref 3.0–12.0)
Monocytes Absolute: 0.7 10*3/uL (ref 0.1–1.0)
NEUTROS ABS: 3.3 10*3/uL (ref 1.4–7.7)
NEUTROS PCT: 49.4 % (ref 43.0–77.0)
PLATELETS: 306 10*3/uL (ref 150.0–400.0)
RBC: 4.84 Mil/uL (ref 3.87–5.11)
RDW: 16.5 % — AB (ref 11.5–15.5)
WBC: 6.7 10*3/uL (ref 4.0–10.5)

## 2016-05-23 LAB — COMPREHENSIVE METABOLIC PANEL
ALT: 17 U/L (ref 0–35)
AST: 17 U/L (ref 0–37)
Albumin: 4.1 g/dL (ref 3.5–5.2)
Alkaline Phosphatase: 100 U/L (ref 39–117)
BUN: 13 mg/dL (ref 6–23)
CO2: 28 mEq/L (ref 19–32)
Calcium: 9.5 mg/dL (ref 8.4–10.5)
Chloride: 106 mEq/L (ref 96–112)
Creatinine, Ser: 0.6 mg/dL (ref 0.40–1.20)
GFR: 106.62 mL/min (ref >60.00)
Glucose, Bld: 102 mg/dL — ABNORMAL HIGH (ref 70–99)
Potassium: 5.1 mEq/L (ref 3.5–5.1)
Sodium: 140 mEq/L (ref 135–145)
Total Bilirubin: 1.2 mg/dL (ref 0.2–1.2)
Total Protein: 6.7 g/dL (ref 6.0–8.3)

## 2016-05-23 LAB — LIPID PANEL
CHOL/HDL RATIO: 3
Cholesterol: 148 mg/dL (ref 0–200)
HDL: 47.5 mg/dL (ref >39.00)
LDL Cholesterol: 77 mg/dL (ref 0–99)
NONHDL: 100.15
Triglycerides: 115 mg/dL (ref 0.0–149.0)
VLDL: 23 mg/dL (ref 0.0–40.0)

## 2016-05-23 NOTE — Patient Instructions (Signed)
Call Dr. Perley Jain office and confirm date of last colonoscopy (02/15/07).   Ask when you are expected to get your next colonoscopy.

## 2016-05-23 NOTE — Progress Notes (Signed)
Pre visit review using our clinic review tool, if applicable. No additional management support is needed unless otherwise documented below in the visit note. 

## 2016-05-23 NOTE — Progress Notes (Signed)
Office Note 05/23/2016  CC:  Chief Complaint  Patient presents with  . Annual Exam    fasting    HPI:  [name] is a 65 y.o. White female who is here for annual health maintenance exam.  Her husband Alayia Rena passed away on September 12, 2015.  She is upset being here today b/c it's her first visit back here since he passed away.  She has gone through a course of grief counseling sessions and things are gradually getting better.  Eyes: due for exam. Dental: she will be arranging this. No exercise or formal dieting.  Past Medical History:  Diagnosis Date  . Anxiety   . Carotid artery occlusion   . Essential hypertension, benign 05/22/2014  . Melanoma ([place])   . Mitral valve prolapse   . Mixed hyperlipidemia 05/22/2014  . Peripheral vascular disease, unspecified ([place]) 05/22/2014   innominate and carotid  . Reflux esophagitis   . Stroke [facility]) 2006   Mini  Stroke- not major  . Tobacco abuse, in remission    quit 2009    Past Surgical History:  Procedure Laterality Date  . ABDOMINAL HYSTERECTOMY  1997  . CARDIAC CATHETERIZATION  01/2012   NORMAL  . CAROTID ENDARTERECTOMY  08/21/2007   right - Dr. Amedeo Plenty  . CHOLECYSTECTOMY  2000  . Incision and drainage of left thenar space abscess  02/23/15   s/p cat bite (Dr. Amedeo Plenty)  . innominate stent  2007   12/2014 u/s showed patent stent in innominate and L common carotid.  L prox int carotid 40-59% stenosis  . Left common carotid stent  2006   "                    "                  "                          "                             "  . TRANSTHORACIC ECHOCARDIOGRAM  2007   NORMAL    Family History  Problem Relation Age of Onset  . Heart attack Mother   . CAD Mother   . Heart disease Mother     Before age 75  . Hypertension Mother   . Hyperlipidemia Mother   . Hypertension Father   . Pneumonia Father   . Hyperlipidemia Father   . Heart murmur Sister   . Osteoporosis Paternal Grandmother   . CAD Paternal  Grandfather     Social History   Social History  . Marital status: Married    Spouse name: N/A  . Number of children: N/A  . Years of education: N/A   Occupational History  . Not on file.   Social History Main Topics  . Smoking status: Former Smoker    Types: Cigarettes    Quit date: 08/06/2002  . Smokeless tobacco: Never Used  . Alcohol use No  . Drug use: No  . Sexual activity: Not on file   Other Topics Concern  . Not on file   Social History Narrative   Married, has one daughter.   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   Tobacco: 20 pack-yr hx, quit 2009.   Alcohol: rare.   No  hx of alc or drug problems.   Exercise: walks 2.41miles on treadmill daily.    Outpatient Medications Prior to Visit  Medication Sig Dispense Refill  . aspirin EC 81 MG tablet Take 1 tablet (81 mg total) by mouth daily. 90 tablet 3  . atorvastatin (LIPITOR) 80 MG tablet TAKE 1 TABLET BY MOUTH EVERY DAY 30 tablet 6  . cholecalciferol (VITAMIN D) 1000 UNITS tablet Take 1,000 Units by mouth daily.    . clopidogrel (PLAVIX) 75 MG tablet Take 1 tablet (75 mg total) by mouth daily. 30 tablet 11  . diazepam (VALIUM) 2 MG tablet TAKE 1 TABLET BY MOUTH TWICE DAILY AS NEEDED 60 tablet 5  . fexofenadine (ALLEGRA) 180 MG tablet Take 180 mg by mouth daily as needed for allergies or rhinitis.    Marland Kitchen lisinopril (PRINIVIL,ZESTRIL) 5 MG tablet Take 1 tablet (5 mg total) by mouth daily. 30 tablet 11  . Omega-3 Fatty Acids (FISH OIL) 1000 MG CAPS Take 1,000 mg by mouth daily.    . pantoprazole (PROTONIX) 40 MG tablet Take 40 mg by mouth 2 (two) times daily.    . ranitidine (ZANTAC) 150 MG tablet Take 150 mg by mouth at bedtime.    . pantoprazole (PROTONIX) 40 MG tablet TAKE 1 TABLET BY MOUTH TWICE DAILY 60 tablet 11   No facility-administered medications prior to visit.     Allergies  Allergen Reactions  . Ciprocin-Fluocin-Procin [Fluocinolone] Nausea And Vomiting   . Demerol [Meperidine] Nausea And Vomiting  . Fentanyl Nausea And Vomiting    Pt states all pain medications cause N/V for her  . Penicillins Hives    ROS Review of Systems  Constitutional: Negative for appetite change, chills, fatigue and fever.  HENT: Negative for congestion, dental problem, ear pain and sore throat.   Eyes: Negative for discharge, redness and visual disturbance.  Respiratory: Negative for cough, chest tightness, shortness of breath and wheezing.   Cardiovascular: Negative for chest pain, palpitations and leg swelling.  Gastrointestinal: Negative for abdominal pain, blood in stool, diarrhea, nausea and vomiting.  Genitourinary: Negative for difficulty urinating, dysuria, flank pain, frequency, hematuria and urgency.  Musculoskeletal: Negative for arthralgias, back pain, joint swelling, myalgias and neck stiffness.  Skin: Negative for pallor and rash.  Neurological: Negative for dizziness, speech difficulty, weakness and headaches.  Hematological: Negative for adenopathy. Does not bruise/bleed easily.  Psychiatric/Behavioral: Negative for confusion and sleep disturbance. The patient is not nervous/anxious.     PE; Blood pressure (!) 153/77, pulse 68, temperature 97.7 F (36.5 C), temperature source Oral, resp. rate 16, height 5\' 4"  (1.626 m), weight 137 lb (62.1 kg), SpO2 100 %. Gen: Alert, well appearing.  Patient is oriented to person, place, time, and situation. AFFECT: pleasant, lucid thought and speech. ENT: Ears: EACs clear, normal epithelium.  TMs with good light reflex and landmarks bilaterally.  Eyes: no injection, icteris, swelling, or exudate.  EOMI, PERRLA. Nose: no drainage or turbinate edema/swelling.  No injection or focal lesion.  Mouth: lips without lesion/swelling.  Oral mucosa pink and moist.  Dentition intact and without obvious caries or gingival swelling.  Oropharynx without erythema, exudate, or swelling.  Neck: supple/nontender.  No LAD, mass,  or TM.  Carotid pulses 2+ bilaterally, without bruits. CV: RRR, no m/r/g.   LUNGS: CTA bilat, nonlabored resps, good aeration in all lung fields. ABD: soft, NT, ND, BS normal.  No hepatospenomegaly or mass.  Very soft umbilical bruits. EXT: no clubbing, cyanosis, or edema.  Musculoskeletal: no joint  swelling, erythema, warmth, or tenderness.  ROM of all joints intact. Skin - no sores or suspicious lesions or rashes or color changes  Pertinent labs:  No results found for: TSH Lab Results  Component Value Date   WBC 10.1 02/23/2015   HGB 14.1 02/23/2015   HCT 43.5 02/23/2015   MCV 88.1 02/23/2015   PLT 316 02/23/2015   Lab Results  Component Value Date   CREATININE 0.68 04/06/2015   BUN 16 04/06/2015   NA 143 04/06/2015   K 4.6 04/06/2015   CL 106 04/06/2015   CO2 28 04/06/2015   Lab Results  Component Value Date   ALT 27 08/17/2007   AST 26 08/17/2007   ALKPHOS 108 08/17/2007   BILITOT 1.5 (H) 08/17/2007   Lab Results  Component Value Date   CHOL 159 08/10/2015   Lab Results  Component Value Date   HDL 49.10 08/10/2015   Lab Results  Component Value Date   LDLCALC 80 08/10/2015   Lab Results  Component Value Date   TRIG 150.0 (H) 08/10/2015   Lab Results  Component Value Date   CHOLHDL 3 08/10/2015   Lab Results  Component Value Date   HGBA1C 5.9 04/06/2015    ASSESSMENT AND PLAN:   Health maintenance exam: Reviewed age and gender appropriate health maintenance issues (prudent diet, regular exercise, health risks of tobacco and excessive alcohol, use of seatbelts, fire alarms in home, use of sunscreen).  Also reviewed age and gender appropriate health screening as well as vaccine recommendations. Pt states she is UTD on pneumovax 23. Prevnar 13 given today. Fasting lipids, CBC, and CMET drawn today. Ordered screening mammogram and DEXA today. Colon cancer screening: Call Dr. Perley Jain office and confirm date of last colonoscopy (02/15/07).   Ask when you  are expected to get your next colonoscopy.  Signed:  Crissie Sickles, MD           05/23/2016  FOLLOW UP:  Return in about 6 months (around 11/23/2016) for routine chronic illness f/u.  Signed:  Crissie Sickles, MD           05/23/2016

## 2016-06-04 ENCOUNTER — Other Ambulatory Visit: Payer: Self-pay | Admitting: Family Medicine

## 2016-06-05 NOTE — Progress Notes (Signed)
Patient ID: [name], female   DOB: 1951/04/22, 65 y.o.   MRN: [id number]     Cardiology Office Note   Date:  06/06/2016   ID:  EME LARIVEE, DOB April 27, 1951, MRN [id number]  PCP:  Tammi Sou, MD    No chief complaint on file.  hyperlipidemia   Wt Readings from Last 3 Encounters:  06/06/16 140 lb (63.5 kg)  05/23/16 137 lb (62.1 kg)  01/04/16 135 lb (61.2 kg)       History of Present Illness: [name] is a 65 y.o. female  with a family history of significant coronary artery disease. She has a history of early vascular disease and difficult to control hyperlipidemia.  She has had significant carotid disease in the past with stents placed in the innominate artery and common carotid artery. She had a cardiac cath in 2013 showing no significant coronary artery disease and only minimal coronary calcification.   She is tearful.  Her husband passed away in Sep 13, 2015. She does not feel that it is easier.  She feels that she is learning how to live. He was at work in [place] when he died from a viral illness.  It was very quick.    Overall, she had been doing well. She denied any chest discomfort. She has had negative stress tests in the past. She had a routine lipid check and her LDL had increased significantly despite her eating a healthy diet. She has had the expected anxiety and depression.  She eats more healthy and she is walking several miles daily before work. She has given up red meat.   She is here for follow-up.  LDL well controlled in 04/2016.  Palpitations- lasting one second, feels like a flip flop.  No syncope.     Past Medical History:  Diagnosis Date  . Anxiety   . Carotid artery occlusion   . Essential hypertension, benign 05/22/2014  . Melanoma ([place])   . Mitral valve prolapse   . Mixed hyperlipidemia 05/22/2014  . Peripheral vascular disease, unspecified ([place]) 05/22/2014   innominate and carotid  . Reflux esophagitis   . Stroke [facility]) 2006   Mini  Stroke- not major  . Tobacco abuse, in remission    quit 2009    Past Surgical History:  Procedure Laterality Date  . ABDOMINAL HYSTERECTOMY  1997  . CARDIAC CATHETERIZATION  01/2012   NORMAL  . CAROTID ENDARTERECTOMY  08/21/2007   right - Dr. Amedeo Plenty  . CHOLECYSTECTOMY  2000  . Incision and drainage of left thenar space abscess  02/23/15   s/p cat bite (Dr. Amedeo Plenty)  . innominate stent  2007   12/2014 u/s showed patent stent in innominate and L common carotid.  L prox int carotid 40-59% stenosis  . Left common carotid stent  2006   "                    "                  "                          "                             "  . TRANSTHORACIC ECHOCARDIOGRAM  2007   NORMAL     Current Outpatient Prescriptions  Medication Sig Dispense Refill  .  aspirin EC 81 MG tablet Take 1 tablet (81 mg total) by mouth daily. 90 tablet 3  . atorvastatin (LIPITOR) 80 MG tablet TAKE 1 TABLET BY MOUTH EVERY DAY 30 tablet 6  . cholecalciferol (VITAMIN D) 1000 UNITS tablet Take 1,000 Units by mouth daily.    . clopidogrel (PLAVIX) 75 MG tablet Take 1 tablet (75 mg total) by mouth daily. 30 tablet 11  . diazepam (VALIUM) 2 MG tablet Take 2 mg by mouth every 6 (six) hours as needed for anxiety.    . fexofenadine (ALLEGRA) 180 MG tablet Take 180 mg by mouth daily as needed for allergies or rhinitis.    Marland Kitchen lisinopril (PRINIVIL,ZESTRIL) 5 MG tablet TAKE 1 TABLET BY MOUTH EVERY DAY 30 tablet 11  . Omega-3 Fatty Acids (FISH OIL) 1000 MG CAPS Take 1,000 mg by mouth daily.    . pantoprazole (PROTONIX) 40 MG tablet Take 40 mg by mouth 2 (two) times daily.    . ranitidine (ZANTAC) 150 MG tablet Take 150 mg by mouth at bedtime.     No current facility-administered medications for this visit.     Allergies:   Ciprocin-fluocin-procin [fluocinolone]; Demerol [meperidine]; Fentanyl; and Penicillins    Social History:  The patient  reports that she quit smoking about 13 years ago. Her smoking use included  Cigarettes. She has never used smokeless tobacco. She reports that she does not drink alcohol or use drugs.   Family History:  The patient's family history includes CAD in her mother and paternal grandfather; Heart attack in her mother; Heart disease in her mother; Heart murmur in her sister; Hyperlipidemia in her father and mother; Hypertension in her father and mother; Osteoporosis in her paternal grandmother; Pneumonia in her father.    ROS:  Please see the history of present illness.   Otherwise, review of systems are positive for grief, rare palpitation.   All other systems are reviewed and negative.    PHYSICAL EXAM: VS:  BP (!) 142/70   Pulse 83   Ht 5\' 4"  (1.626 m)   Wt 140 lb (63.5 kg)   SpO2 98%   BMI 24.03 kg/m  , BMI Body mass index is 24.03 kg/m. GEN: Well nourished, well developed, in no acute distress  HEENT: normal  Neck: no JVD, carotid bruits, or masses Cardiac: RRR; no murmurs, rubs, or gallops,no edema  Respiratory:  clear to auscultation bilaterally, normal work of breathing GI: soft, nontender, nondistended, + BS MS: no deformity or atrophy  Skin: warm and dry, no rash Neuro:  Strength and sensation are intact Psych: euthymic mood, full affect   EKG:   The ekg ordered today demonstrates normal ECG   Recent Labs: 05/23/2016: ALT 17; BUN 13; Creatinine, Ser 0.60; Hemoglobin 13.7; Platelets 306.0; Potassium 5.1; Sodium 140   Lipid Panel    Component Value Date/Time   CHOL 148 05/23/2016 0844   TRIG 115.0 05/23/2016 0844   HDL 47.50 05/23/2016 0844   CHOLHDL 3 05/23/2016 0844   VLDL 23.0 05/23/2016 0844   LDLCALC 77 05/23/2016 0844   LDLDIRECT 107.0 04/06/2015 0859     Other studies Reviewed: Additional studies/ records that were reviewed today with results demonstrating: Cardiac cath reviewed..   ASSESSMENT AND PLAN:  1. Hyperlipidemia: Added Zetia 10 mg daily to her atorvastatin.  She stopped it several years ago.  Lipids checked by her primary  care doctor in a few months. Try to keep her LDL less than 100 given her peripheral vascular disease.  No need for Zetia at this time.  2. Palpitations:  Occasional skipped beat.  Lasts 1-2 seconds.  No further w/u 3. Peripheral arterial disease: No signs of recurrent disease at this point. Continue to try to control LDL and lifestyle to minimize disease progression. Decreased aspirin dose from 325 mg to 81 mg. Continue clopidogrel.  No bleeding issues. 4. HTN: BP recheck was 132/68. Continue lisinopril.    Current medicines are reviewed at length with the patient today.  The patient concerns regarding her medicines were addressed.  The following changes have been made: As above  Labs/ tests ordered today include:  No orders of the defined types were placed in this encounter.   Recommend 150 minutes/week of aerobic exercise Low fat, low carb, high fiber diet recommended  Disposition:   FU in one year   Signed, Larae Grooms, MD  06/06/2016 8:37 AM    [facility] [place], [place], [place]  [address] Phone: [phone]; Fax: [phone]

## 2016-06-06 ENCOUNTER — Encounter: Payer: Self-pay | Admitting: Interventional Cardiology

## 2016-06-06 ENCOUNTER — Ambulatory Visit
Admission: RE | Admit: 2016-06-06 | Discharge: 2016-06-06 | Disposition: A | Discharge status: Home / Self Care | Payer: PPO | Source: Ambulatory Visit | Attending: Family Medicine | Admitting: Family Medicine

## 2016-06-06 ENCOUNTER — Ambulatory Visit (INDEPENDENT_AMBULATORY_CARE_PROVIDER_SITE_OTHER): Payer: PPO | Admitting: Interventional Cardiology

## 2016-06-06 VITALS — BP 142/70 | HR 83 | Ht 64.0 in | Wt 140.0 lb

## 2016-06-06 DIAGNOSIS — I739 Peripheral vascular disease, unspecified: Secondary | ICD-10-CM | POA: Diagnosis not present

## 2016-06-06 DIAGNOSIS — Z1239 Encounter for other screening for malignant neoplasm of breast: Secondary | ICD-10-CM

## 2016-06-06 DIAGNOSIS — Z1231 Encounter for screening mammogram for malignant neoplasm of breast: Secondary | ICD-10-CM | POA: Diagnosis not present

## 2016-06-06 DIAGNOSIS — M8580 Other specified disorders of bone density and structure, unspecified site: Secondary | ICD-10-CM

## 2016-06-06 DIAGNOSIS — M8588 Other specified disorders of bone density and structure, other site: Secondary | ICD-10-CM | POA: Diagnosis not present

## 2016-06-06 DIAGNOSIS — I10 Essential (primary) hypertension: Secondary | ICD-10-CM

## 2016-06-06 DIAGNOSIS — E782 Mixed hyperlipidemia: Secondary | ICD-10-CM

## 2016-06-06 DIAGNOSIS — E2839 Other primary ovarian failure: Secondary | ICD-10-CM

## 2016-06-06 DIAGNOSIS — Z780 Asymptomatic menopausal state: Secondary | ICD-10-CM | POA: Diagnosis not present

## 2016-06-06 HISTORY — PX: OTHER SURGICAL HISTORY: SHX169

## 2016-06-06 HISTORY — DX: Other specified disorders of bone density and structure, unspecified site: M85.80

## 2016-06-06 NOTE — Patient Instructions (Addendum)
Your physician recommends that you continue on your current medications as directed. Please refer to the Current Medication list given to you today. Your physician wants you to follow-up in: 1 year with Dr. Irish Lack.   You will receive a reminder letter in the mail two months in advance. If you don't receive a letter, please call our office to schedule the follow-up appointment.  Plan to have lab work prior to your next appointment.

## 2016-08-22 ENCOUNTER — Other Ambulatory Visit: Payer: Self-pay | Admitting: Family Medicine

## 2016-08-22 NOTE — Telephone Encounter (Signed)
RF request for dazepam LOV: 05/23/16 Next ov: 11/23/16 Last written: 03/10/16 #60 w/ 5RF

## 2016-08-22 NOTE — Telephone Encounter (Signed)
Rx faxed

## 2016-09-26 DIAGNOSIS — R7303 Prediabetes: Secondary | ICD-10-CM

## 2016-09-26 HISTORY — DX: Prediabetes: R73.03

## 2016-09-29 ENCOUNTER — Other Ambulatory Visit: Payer: Self-pay | Admitting: Family Medicine

## 2016-09-29 NOTE — Telephone Encounter (Signed)
LOV: 05/23/16 NOV: 11/23/16  RF request for clopidogrel Last written: 10/14/15 #30 w/ 11RF  RF request for pantoprazole Last written: 10/14/15 #60 w/ 11RF

## 2016-10-25 DIAGNOSIS — H578 Other specified disorders of eye and adnexa: Secondary | ICD-10-CM | POA: Diagnosis not present

## 2016-10-27 ENCOUNTER — Other Ambulatory Visit: Payer: Self-pay | Admitting: Family Medicine

## 2016-11-23 ENCOUNTER — Encounter: Payer: Self-pay | Admitting: Family Medicine

## 2016-11-23 ENCOUNTER — Ambulatory Visit (INDEPENDENT_AMBULATORY_CARE_PROVIDER_SITE_OTHER): Payer: PPO | Admitting: Family Medicine

## 2016-11-23 VITALS — BP 140/88 | HR 68 | Temp 97.6°F | Resp 16 | Ht 64.0 in | Wt 142.0 lb

## 2016-11-23 DIAGNOSIS — Z9189 Other specified personal risk factors, not elsewhere classified: Secondary | ICD-10-CM

## 2016-11-23 DIAGNOSIS — I10 Essential (primary) hypertension: Secondary | ICD-10-CM | POA: Diagnosis not present

## 2016-11-23 DIAGNOSIS — K2100 Gastro-esophageal reflux disease with esophagitis, without bleeding: Secondary | ICD-10-CM

## 2016-11-23 DIAGNOSIS — E7800 Pure hypercholesterolemia, unspecified: Secondary | ICD-10-CM | POA: Diagnosis not present

## 2016-11-23 DIAGNOSIS — Z1159 Encounter for screening for other viral diseases: Secondary | ICD-10-CM

## 2016-11-23 LAB — BASIC METABOLIC PANEL
BUN: 12 mg/dL (ref 6–23)
CHLORIDE: 105 meq/L (ref 96–112)
CO2: 29 mEq/L (ref 19–32)
CREATININE: 0.68 mg/dL (ref 0.40–1.20)
Calcium: 9.9 mg/dL (ref 8.4–10.5)
GFR: 92.14 mL/min (ref >60.00)
Glucose, Bld: 90 mg/dL (ref 70–99)
POTASSIUM: 4.9 meq/L (ref 3.5–5.1)
Sodium: 139 mEq/L (ref 135–145)

## 2016-11-23 LAB — LIPID PANEL
CHOL/HDL RATIO: 3
Cholesterol: 151 mg/dL (ref 0–200)
HDL: 51.8 mg/dL (ref >39.00)
LDL Cholesterol: 64 mg/dL (ref 0–99)
NONHDL: 99.56
Triglycerides: 180 mg/dL — ABNORMAL HIGH (ref 0.0–149.0)
VLDL: 36 mg/dL (ref 0.0–40.0)

## 2016-11-23 NOTE — Progress Notes (Signed)
OFFICE VISIT  11/23/2016   CC:  Chief Complaint  Patient presents with  . Follow-up    RCI, pt is fasting.    HPI:    Patient is a 66 y.o.  female who presents for 6 mo f/u HTN, HLD, GERD with hx of reflux esophagitis.  HTN: no home monitoring being done.  Has no dizziness or HAs. She has bp cuff.    HLD: takes atorv daily, no side effects.  Exercise: walks daily 2 miles  GERD: takes pantoprazole 40mg  bid and ranitidine 150mg  qd, eats a GERD-friendly diet.  She has tried to ween down off her meds and says she can't b/c of return of constant GER.  ROS: no CP, no SOB, no LE swelling, no myalgias.  Past Medical History:  Diagnosis Date  . Anxiety   . Carotid artery occlusion   . Essential hypertension, benign 05/22/2014  . Melanoma ([place])   . Mitral valve prolapse   . Mixed hyperlipidemia 05/22/2014  . Osteopenia 06/06/2016   DEXA T-score -1.5.  Repeat DEXA 2 yrs.  . Peripheral vascular disease, unspecified 05/22/2014   innominate and carotid  . Reflux esophagitis   . Stroke [facility]) 2006   Mini  Stroke- not major  . Tobacco abuse, in remission    quit 2009    Past Surgical History:  Procedure Laterality Date  . ABDOMINAL HYSTERECTOMY  1997  . CARDIAC CATHETERIZATION  01/2012   NORMAL  . CAROTID ENDARTERECTOMY  08/21/2007   right - Dr. Amedeo Plenty  . CHOLECYSTECTOMY  2000  . DEXA  06/06/2016   T-score -1.5.  Repeat 2 yrs.  . Incision and drainage of left thenar space abscess  02/23/15   s/p cat bite (Dr. Amedeo Plenty)  . innominate stent  2007   12/2014 u/s showed patent stent in innominate and L common carotid.  L prox int carotid 40-59% stenosis  . Left common carotid stent  2006   "                    "                  "                          "                             "  . TRANSTHORACIC ECHOCARDIOGRAM  2007   NORMAL    Outpatient Medications Prior to Visit  Medication Sig Dispense Refill  . aspirin EC 81 MG tablet Take 1 tablet (81 mg total) by mouth daily. 90  tablet 3  . atorvastatin (LIPITOR) 80 MG tablet TAKE 1 TABLET BY MOUTH EVERY DAY 30 tablet 0  . cholecalciferol (VITAMIN D) 1000 UNITS tablet Take 1,000 Units by mouth daily.    . clopidogrel (PLAVIX) 75 MG tablet Take 1 tablet (75 mg total) by mouth daily. 30 tablet 6  . diazepam (VALIUM) 2 MG tablet Take 1 tablet by mouth twice daily AS NEEDED 60 tablet 5  . fexofenadine (ALLEGRA) 180 MG tablet Take 180 mg by mouth daily as needed for allergies or rhinitis.    Marland Kitchen lisinopril (PRINIVIL,ZESTRIL) 5 MG tablet TAKE 1 TABLET BY MOUTH EVERY DAY 30 tablet 11  . Omega-3 Fatty Acids (FISH OIL) 1000 MG CAPS Take 1,000 mg by mouth daily.    . pantoprazole (PROTONIX)  40 MG tablet TAKE 1 TABLET BY MOUTH TWICE DAILY 60 tablet 6  . ranitidine (ZANTAC) 150 MG tablet Take 150 mg by mouth at bedtime.    . pantoprazole (PROTONIX) 40 MG tablet Take 40 mg by mouth 2 (two) times daily.     No facility-administered medications prior to visit.     Allergies  Allergen Reactions  . Ciprocin-Fluocin-Procin [Fluocinolone] Nausea And Vomiting  . Demerol [Meperidine] Nausea And Vomiting  . Fentanyl Nausea And Vomiting    Pt states all pain medications cause N/V for her  . Penicillins Hives    ROS As per HPI  PE: Blood pressure 140/88, pulse 68, temperature 97.6 F (36.4 C), temperature source Oral, resp. rate 16, height 5\' 4"  (1.626 m), weight 142 lb (64.4 kg), SpO2 97 %. Gen: Alert, well appearing.  Patient is oriented to person, place, time, and situation. CV: RRR, no m/r/g.   LUNGS: CTA bilat, nonlabored resps, good aeration in all lung fields. EXT: no clubbing, cyanosis, or edema.   LABS:  No results found for: TSH Lab Results  Component Value Date   WBC 6.7 05/23/2016   HGB 13.7 05/23/2016   HCT 41.5 05/23/2016   MCV 85.8 05/23/2016   PLT 306.0 05/23/2016   Lab Results  Component Value Date   CREATININE 0.60 05/23/2016   BUN 13 05/23/2016   NA 140 05/23/2016   K 5.1 05/23/2016   CL 106  05/23/2016   CO2 28 05/23/2016   Lab Results  Component Value Date   ALT 17 05/23/2016   AST 17 05/23/2016   ALKPHOS 100 05/23/2016   BILITOT 1.2 05/23/2016   Lab Results  Component Value Date   CHOL 148 05/23/2016   Lab Results  Component Value Date   HDL 47.50 05/23/2016   Lab Results  Component Value Date   LDLCALC 77 05/23/2016   Lab Results  Component Value Date   TRIG 115.0 05/23/2016   Lab Results  Component Value Date   CHOLHDL 3 05/23/2016   Lab Results  Component Value Date   HGBA1C 5.9 04/06/2015   IMPRESSION AND PLAN:  HTN: needs to monitor bp at home to get some data to compare to office measurements. No changes today. Instructions: Check blood pressure and heart rate 3 days per week and bring these numbers with you to your next visit with me in 6 mo. If bp consistently > 140 on top or consistently >90 on bottom then call or return earlier. BMET today.  2) HLD: tolerating statin, check FLP today.  AST/ALT recheck at next f/u in 6 mo.  3) GERD with hx of reflux esophagitis: she is unable to ween off her current regimen of meds. The current medical regimen is effective;  continue present plan and medications.  An After Visit Summary was printed and given to the patient.  FOLLOW UP: Return in about 6 months (around 05/23/2017) for annual CPE (fasting).  Signed:  Crissie Sickles, MD           11/23/2016

## 2016-11-23 NOTE — Patient Instructions (Signed)
Check blood pressure and heart rate 3 days per week and bring these numbers with you to your next visit with me in 6 mo. If bp consistently > 140 on top or consistently >90 on bottom then call or return earlier.

## 2016-11-23 NOTE — Progress Notes (Signed)
Pre visit review using our clinic review tool, if applicable. No additional management support is needed unless otherwise documented below in the visit note. 

## 2016-11-24 LAB — HEPATITIS C ANTIBODY: HCV AB: NEGATIVE

## 2016-12-29 ENCOUNTER — Encounter: Payer: Self-pay | Admitting: Family

## 2017-01-09 ENCOUNTER — Encounter: Payer: Self-pay | Admitting: Family

## 2017-01-09 ENCOUNTER — Ambulatory Visit (INDEPENDENT_AMBULATORY_CARE_PROVIDER_SITE_OTHER): Payer: PPO | Admitting: Family

## 2017-01-09 ENCOUNTER — Ambulatory Visit (HOSPITAL_COMMUNITY)
Admission: RE | Admit: 2017-01-09 | Discharge: 2017-01-09 | Disposition: A | Discharge status: Home / Self Care | Payer: PPO | Source: Ambulatory Visit | Attending: Surgery | Admitting: Surgery

## 2017-01-09 VITALS — BP 128/76 | HR 76 | Temp 97.5°F | Resp 18 | Ht 64.0 in | Wt 138.0 lb

## 2017-01-09 DIAGNOSIS — Z87891 Personal history of nicotine dependence: Secondary | ICD-10-CM | POA: Diagnosis not present

## 2017-01-09 DIAGNOSIS — I6523 Occlusion and stenosis of bilateral carotid arteries: Secondary | ICD-10-CM

## 2017-01-09 DIAGNOSIS — Z9889 Other specified postprocedural states: Secondary | ICD-10-CM

## 2017-01-09 DIAGNOSIS — Z48812 Encounter for surgical aftercare following surgery on the circulatory system: Secondary | ICD-10-CM

## 2017-01-09 DIAGNOSIS — Z959 Presence of cardiac and vascular implant and graft, unspecified: Secondary | ICD-10-CM

## 2017-01-09 NOTE — Patient Instructions (Signed)
Stroke Prevention Some medical conditions and behaviors are associated with an increased chance of having a stroke. You may prevent a stroke by making healthy choices and managing medical conditions. How can I reduce my risk of having a stroke?  Stay physically active. Get at least 30 minutes of activity on most or all days.  Do not smoke. It may also be helpful to avoid exposure to secondhand smoke.  Limit alcohol use. Moderate alcohol use is considered to be:  No more than 2 drinks per day for men.  No more than 1 drink per day for nonpregnant women.  Eat healthy foods. This involves:  Eating 5 or more servings of fruits and vegetables a day.  Making dietary changes that address high blood pressure (hypertension), high cholesterol, diabetes, or obesity.  Manage your cholesterol levels.  Making food choices that are high in fiber and low in saturated fat, trans fat, and cholesterol may control cholesterol levels.  Take any prescribed medicines to control cholesterol as directed by your health care provider.  Manage your diabetes.  Controlling your carbohydrate and sugar intake is recommended to manage diabetes.  Take any prescribed medicines to control diabetes as directed by your health care provider.  Control your hypertension.  Making food choices that are low in salt (sodium), saturated fat, trans fat, and cholesterol is recommended to manage hypertension.  Ask your health care provider if you need treatment to lower your blood pressure. Take any prescribed medicines to control hypertension as directed by your health care provider.  If you are 18-39 years of age, have your blood pressure checked every 3-5 years. If you are 40 years of age or older, have your blood pressure checked every year.  Maintain a healthy weight.  Reducing calorie intake and making food choices that are low in sodium, saturated fat, trans fat, and cholesterol are recommended to manage  weight.  Stop drug abuse.  Avoid taking birth control pills.  Talk to your health care provider about the risks of taking birth control pills if you are over 35 years old, smoke, get migraines, or have ever had a blood clot.  Get evaluated for sleep disorders (sleep apnea).  Talk to your health care provider about getting a sleep evaluation if you snore a lot or have excessive sleepiness.  Take medicines only as directed by your health care provider.  For some people, aspirin or blood thinners (anticoagulants) are helpful in reducing the risk of forming abnormal blood clots that can lead to stroke. If you have the irregular heart rhythm of atrial fibrillation, you should be on a blood thinner unless there is a good reason you cannot take them.  Understand all your medicine instructions.  Make sure that other conditions (such as anemia or atherosclerosis) are addressed. Get help right away if:  You have sudden weakness or numbness of the face, arm, or leg, especially on one side of the body.  Your face or eyelid droops to one side.  You have sudden confusion.  You have trouble speaking (aphasia) or understanding.  You have sudden trouble seeing in one or both eyes.  You have sudden trouble walking.  You have dizziness.  You have a loss of balance or coordination.  You have a sudden, severe headache with no known cause.  You have new chest pain or an irregular heartbeat. Any of these symptoms may represent a serious problem that is an emergency. Do not wait to see if the symptoms will go away.   Get medical help at once. Call your local emergency services (911 in [place] Do not drive yourself to the hospital. This information is not intended to replace advice given to you by your health care provider. Make sure you discuss any questions you have with your health care provider. Document Released: 10/20/2004 Document Revised: 02/18/2016 Document Reviewed: 03/15/2013 Elsevier  Interactive Patient Education  2017 Elsevier Inc.      Preventing Cerebrovascular Disease Arteries are blood vessels that carry blood that contains oxygen from the heart to all parts of the body. Cerebrovascular disease affects arteries that supply the brain. Any condition that blocks or disrupts blood flow to the brain can cause cerebrovascular disease. Brain cells that lose blood supply start to die within minutes (stroke). Stroke is the main danger of cerebrovascular disease. Atherosclerosis and high blood pressure are common causes of cerebrovascular disease. Atherosclerosis is narrowing and hardening of an artery that results when fat, cholesterol, calcium, or other substances (plaque) build up inside an artery. Plaque reduces blood flow through the artery. High blood pressure increases the risk of bleeding inside the brain. Making diet and lifestyle changes to prevent atherosclerosis and high blood pressure lowers your risk of cerebrovascular disease. What nutrition changes can be made?  Eat more fruits, vegetables, and whole grains.  Reduce how much saturated fat you eat. To do this, eat less red meat and fewer full-fat dairy products.  Eat healthy proteins instead of red meat. Healthy proteins include:  Fish. Eat fish that contains heart-healthy omega-3 fatty acids, twice a week. Examples include salmon, albacore tuna, mackerel, and herring.  Chicken.  Nuts.  Low-fat or nonfat yogurt.  Avoid processed meats, like bacon and lunchmeat.  Avoid foods that contain:  A lot of sugar, such as sweets and drinks with added sugar.  A lot of salt (sodium). Avoid adding extra salt to your food, as told by your health care provider.  Trans fats, such as margarine and baked goods. Trans fats may be listed as "partially hydrogenated oils" on food labels.  Check food labels to see how much sodium, sugar, and trans fats are in foods.  Use vegetable oils that contain low amounts of  saturated fat, such as olive oil or canola oil. What lifestyle changes can be made?  Drink alcohol in moderation. This means no more than 1 drink a day for nonpregnant women and 2 drinks a day for men. One drink equals 12 oz of beer, 5 oz of wine, or 1 oz of hard liquor.  If you are overweight, ask your health care provider to recommend a weight-loss plan for you. Losing 5-10 lb (2.2-4.5 kg) can reduce your risk of diabetes, atherosclerosis, and high blood pressure.  Exercise for 30?60 minutes on most days, or as much as told by your health care provider.  Do moderate-intensity exercise, such as brisk walking, bicycling, and water aerobics. Ask your health care provider which activities are safe for you.  Do not use any products that contain nicotine or tobacco, such as cigarettes and e-cigarettes. If you need help quitting, ask your health care provider. Why are these changes important? Making these changes lowers your risk of many diseases that can cause cerebrovascular disease and stroke. Stroke is a leading cause of death and disability. Making these changes also improves your overall health and quality of life. What can I do to lower my risk? The following factors make you more likely to develop cerebrovascular disease:  Being overweight.  Smoking.  Being physically   inactive.  Eating a high-fat diet.  Having certain health conditions, such as:  Diabetes.  High blood pressure.  Heart disease.  Atherosclerosis.  High cholesterol.  Sickle cell disease. Talk with your health care provider about your risk for cerebrovascular disease. Work with your health care provider to control diseases that you have that may contribute to cerebrovascular disease. Your health care provider may prescribe medicines to help prevent major causes of cerebrovascular disease. Where to find more information: Learn more about preventing cerebrovascular disease from:  National Heart, Lung, and  Blood Institute: www.nhlbi.nih.gov/health/health-topics/topics/stroke  Centers for Disease Control and Prevention: cdc.gov/stroke/about.htm Summary  Cerebrovascular disease can lead to a stroke.  Atherosclerosis and high blood pressure are major causes of cerebrovascular disease.  Making diet and lifestyle changes can reduce your risk of cerebrovascular disease.  Work with your health care provider to get your risk factors under control to reduce your risk of cerebrovascular disease. This information is not intended to replace advice given to you by your health care provider. Make sure you discuss any questions you have with your health care provider. Document Released: 09/27/2015 Document Revised: 04/01/2016 Document Reviewed: 09/27/2015 Elsevier Interactive Patient Education  2017 Elsevier Inc.  

## 2017-01-09 NOTE — Progress Notes (Signed)
Chief Complaint: Follow up Extracranial Carotid Artery Stenosis   History of Present Illness  [name] is a 66 y.o. female patient of Dr. Trula Slade with a history of a right carotid endarterectomy on 08/21/2007. She has had an innominate artery stent placed on 06/23/2006, and a left common carotid artery stent on 03/31/2005; all of these procedures were performed by Dr. Amedeo Plenty. She returns today for follow up.  Patient has a history of a TIA in 2006 but denies any symptoms, states hs had no sx's other than dizziness, was told that she had TIA's by results of CT.   She denies any history of amaurosis fugax or monocular blindness, hemiplegia, or receptive or expressive aphasia.   She denies claudication symptoms in her legs with walking.  She walks 2 miles daily, is diligent about eating healthy.  She reports mowing her lawn on 01-03-17 when she had an episode of dyspnea and "just felt bad". She wears an Apple watch that also transmits her heart rate to her phone, and her heart rate at that time was was 201 which lasted for about a minute. After a minute of this she started feeling better.   Pt Diabetic: No Pt smoker: former smoker, quit in 2006  Pt meds include: Statin : Yes ASA: Yes Other anticoagulants/antiplatelets: Plavix    Past Medical History:  Diagnosis Date  . Anxiety   . Carotid artery occlusion   . Essential hypertension, benign 05/22/2014  . Melanoma ([place])   . Mitral valve prolapse   . Mixed hyperlipidemia 05/22/2014  . Osteopenia 06/06/2016   DEXA T-score -1.5.  Repeat DEXA 2 yrs.  . Peripheral vascular disease, unspecified ([place]) 05/22/2014   innominate and carotid  . Reflux esophagitis   . Stroke [facility]) 2006   Mini  Stroke- not major  . Tobacco abuse, in remission    quit 2009    Social History Social History  Substance Use Topics  . Smoking status: Former Smoker    Types: Cigarettes    Quit date: 08/06/2002  . Smokeless tobacco: Never Used   . Alcohol use No    Family History Family History  Problem Relation Age of Onset  . Heart attack Mother   . CAD Mother   . Heart disease Mother     Before age 68  . Hypertension Mother   . Hyperlipidemia Mother   . Hypertension Father   . Pneumonia Father   . Hyperlipidemia Father   . Heart murmur Sister   . Osteoporosis Paternal Grandmother   . CAD Paternal Grandfather     Surgical History Past Surgical History:  Procedure Laterality Date  . ABDOMINAL HYSTERECTOMY  1997  . CARDIAC CATHETERIZATION  01/2012   NORMAL  . CAROTID ENDARTERECTOMY  08/21/2007   right - Dr. Amedeo Plenty  . CHOLECYSTECTOMY  2000  . DEXA  06/06/2016   T-score -1.5.  Repeat 2 yrs.  . Incision and drainage of left thenar space abscess  02/23/15   s/p cat bite (Dr. Amedeo Plenty)  . innominate stent  2007   12/2014 u/s showed patent stent in innominate and L common carotid.  L prox int carotid 40-59% stenosis  . Left common carotid stent  2006   "                    "                  "                          "                             "  .  TRANSTHORACIC ECHOCARDIOGRAM  2007   NORMAL    Allergies  Allergen Reactions  . Ciprocin-Fluocin-Procin [Fluocinolone] Nausea And Vomiting  . Demerol [Meperidine] Nausea And Vomiting  . Fentanyl Nausea And Vomiting    Pt states all pain medications cause N/V for her  . Penicillins Hives    Current Outpatient Prescriptions  Medication Sig Dispense Refill  . aspirin EC 81 MG tablet Take 1 tablet (81 mg total) by mouth daily. 90 tablet 3  . atorvastatin (LIPITOR) 80 MG tablet TAKE 1 TABLET BY MOUTH EVERY DAY 30 tablet 0  . cholecalciferol (VITAMIN D) 1000 UNITS tablet Take 1,000 Units by mouth daily.    . clopidogrel (PLAVIX) 75 MG tablet Take 1 tablet (75 mg total) by mouth daily. 30 tablet 6  . diazepam (VALIUM) 2 MG tablet Take 1 tablet by mouth twice daily AS NEEDED 60 tablet 5  . fexofenadine (ALLEGRA) 180 MG tablet Take 180 mg by mouth daily as needed for  allergies or rhinitis.    Marland Kitchen lisinopril (PRINIVIL,ZESTRIL) 5 MG tablet TAKE 1 TABLET BY MOUTH EVERY DAY 30 tablet 11  . Omega-3 Fatty Acids (FISH OIL) 1000 MG CAPS Take 1,000 mg by mouth daily.    . pantoprazole (PROTONIX) 40 MG tablet TAKE 1 TABLET BY MOUTH TWICE DAILY (Patient taking differently: TAKE 1 TABLET BY MOUTH  DAILY) 60 tablet 6  . ranitidine (ZANTAC) 150 MG tablet Take 150 mg by mouth at bedtime.     No current facility-administered medications for this visit.     Review of Systems : See HPI for pertinent positives and negatives.  Physical Examination  Vitals:   01/09/17 1216 01/09/17 1217  BP: 122/80 128/76  Pulse: 86 76  Resp:  18  Temp:  97.5 F (36.4 C)  TempSrc:  Oral  SpO2:  99%  Weight:  138 lb (62.6 kg)  Height:  5\' 4"  (1.626 m)   Body mass index is 23.69 kg/m.  General: WDWN female in NAD GAIT: normal Eyes: PERRLA, right eyelid ptosis Pulmonary:Respirations are non labored, CTAB Cardiac: regular rhythm and rate, no detected murmur   VASCULAR EXAM Carotid Bruits Right Left   Positive, soft Positive, moderate    Aorta is not palpable. Radial pulses are 2+ palpable and equal.    Brachial pulses: 3+ bilaterally                                                                                                                         LE Pulses Right Left       POPLITEAL  not palpable   not palpable       POSTERIOR TIBIAL  2+ palpable   3+ palpable        DORSALIS PEDIS      ANTERIOR TIBIAL not palpable  not palpable      Gastrointestinal: soft, nontender, BS WNL, no r/g, no palpable masses.  Musculoskeletal: No muscle atrophy/wasting. M/S 5/5 throughout, Extremities without ischemic changes.  Neurologic: A&O  X 3; Appropriate Affect, Speech is normal, CN 2-12 intact, tongue is midline, Pain and light touch intact in extremities, Motor exam as listed  above    Assessment: [name] is a 66 y.o. female who is s/p right carotid endarterectomy on 08/21/07; Innominate artery stent on 06/23/06; Left common carotid artery stent on 03/31/05. Patient has a history of a TIA in 2006 but denies any symptoms. She has had no subsequent TIA or stroke.   She walks 2 miles daily, is diligent about eating healthy.   She reports mowing her lawn on 01-03-17 when she had an episode of dyspnea and "just felt bad". She wears an Apple watch that also transmits her heart rate to her phone, and her heart rate at that time was was 201 which lasted for about a minute. After a minute of this she started feeling better. I advised her to notify Dr. Irish Lack office of this today.   DATA Today's carotid duplex suggests widely patent right CEA with no restenosis. 40-59% left ICA stenosis. Innominate artery stent exit velocity of 240 cm/s; unable to visualize stent due to anatomy.  Right subclavian artery disease. Bilateral vertebral artery flow is antegrade.  Right subclavian artery flow is turbulent (200 cm/s), left is normal (triphasic).  No significant change in comparison to the last exam on 01-04-16.     CT angiogram of neck and chest on 08/06/12 requested by Dr. Trula Slade: 1. Patent short segment stents at the brachial cephalic artery and  left common carotid artery origins.  2. Interval right carotid endarterectomy. Suggestion of ulcerated  plaque in the right CCA at the level of the vocal cords. No  cervical right carotid stenosis.  3. Mild soft plaque in the left CCA. Bulky plaque at the left ICA  origin and proximal bulb with short segment stenosis of up to 60-  65%.  4. Right subclavian artery origin stenosis appears related to soft  plaque and measures up to 65%.  5. Normal vertebral arteries.  1. Wide patency of both the right innominate and left common  carotid artery stents without hemodynamically significant stenosis.  2. Normal  caliber of the thoracic aorta without evidence of  dissection.  3. Extensive centrilobular emphysema.     Plan: Follow-up in 1 year with Carotid Duplex scan.   I discussed in depth with the patient the nature of atherosclerosis, and emphasized the importance of maximal medical management including strict control of blood pressure, blood glucose, and lipid levels, obtaining regular exercise, and continued cessation of smoking.  The patient is aware that without maximal medical management the underlying atherosclerotic disease process will progress, limiting the benefit of any interventions. The patient was given information about stroke prevention and what symptoms should prompt the patient to seek immediate medical care. Thank you for allowing [place] to participate in this patient's care.  Clemon Chambers, RN, MSN, FNP-C [facility] Office: [facility] Physician: Trula Slade  01/09/17 12:25 PM

## 2017-01-10 ENCOUNTER — Ambulatory Visit (INDEPENDENT_AMBULATORY_CARE_PROVIDER_SITE_OTHER): Payer: PPO | Admitting: Interventional Cardiology

## 2017-01-10 ENCOUNTER — Encounter: Payer: Self-pay | Admitting: Interventional Cardiology

## 2017-01-10 VITALS — BP 126/64 | HR 84 | Ht 64.0 in | Wt 143.0 lb

## 2017-01-10 DIAGNOSIS — R0602 Shortness of breath: Secondary | ICD-10-CM

## 2017-01-10 DIAGNOSIS — E782 Mixed hyperlipidemia: Secondary | ICD-10-CM | POA: Diagnosis not present

## 2017-01-10 DIAGNOSIS — I739 Peripheral vascular disease, unspecified: Secondary | ICD-10-CM

## 2017-01-10 DIAGNOSIS — I10 Essential (primary) hypertension: Secondary | ICD-10-CM

## 2017-01-10 NOTE — Patient Instructions (Signed)

## 2017-01-10 NOTE — Progress Notes (Signed)
Patient ID: [name], female   DOB: 03/24/1951, 66 y.o.   MRN: [id number]     Cardiology Office Note   Date:  01/10/2017   ID:  [name], DOB 09-27-1950, MRN [id number]  PCP:  Tammi Sou, MD    No chief complaint on file.  hyperlipidemia, heart rate   Wt Readings from Last 3 Encounters:  01/10/17 143 lb (64.9 kg)  01/09/17 138 lb (62.6 kg)  11/23/16 142 lb (64.4 kg)       History of Present Illness: [name] is a 66 y.o. female  with a family history of significant coronary artery disease. She has a history of early vascular disease and difficult to control hyperlipidemia.  She has had significant carotid disease in the past with stents placed in the innominate artery and common carotid artery. She had a cardiac cath in 2013 showing no significant coronary artery disease and only minimal coronary calcification.    Her husband passed away in 10-15-15. She does not feel that it is easier.  She feels that she is learning how to live. He was at work in [place] when he died from a viral illness.  It was very quick.    Overall, she had been doing well. She denied any chest discomfort. She has had negative stress tests in the past.     She eats more healthy and she is walking several miles daily before work. She has given up red meat.   She is here for follow-up.    LDL well controlled in 04/2016.  Palpitations- lasting one second, feels like a flip flop.  No syncope.   Her watch to text her heart rate. She had one day while she was mowing the lawn with a watch detected a heart rate of 200. She did not feel any palpitations or lightheadedness. She continued to monitor the lawn. It resolved when she stopped mowing the lawn. She occasionally feels some shortness of breath when going up stairs. She does the treadmill for 40 minutes before work as noted above and has no shortness of breath with this activity. No chest pain with that.    Past Medical History:    Diagnosis Date  . Anxiety   . Carotid artery occlusion   . Essential hypertension, benign 05/22/2014  . Melanoma ([place])   . Mitral valve prolapse   . Mixed hyperlipidemia 05/22/2014  . Osteopenia 06/06/2016   DEXA T-score -1.5.  Repeat DEXA 2 yrs.  . Peripheral vascular disease, unspecified ([place]) 05/22/2014   innominate and carotid  . Reflux esophagitis   . Stroke [facility]) 2006   Mini  Stroke- not major  . Tobacco abuse, in remission    quit 2009    Past Surgical History:  Procedure Laterality Date  . ABDOMINAL HYSTERECTOMY  1997  . CARDIAC CATHETERIZATION  01/2012   NORMAL  . CAROTID ENDARTERECTOMY  08/21/2007   right - Dr. Amedeo Plenty  . CHOLECYSTECTOMY  2000  . DEXA  06/06/2016   T-score -1.5.  Repeat 2 yrs.  . Incision and drainage of left thenar space abscess  02/23/15   s/p cat bite (Dr. Amedeo Plenty)  . innominate stent  2007   12/2014 u/s showed patent stent in innominate and L common carotid.  L prox int carotid 40-59% stenosis  . Left common carotid stent  2006   "                    "                  "                          "                             "  .  TRANSTHORACIC ECHOCARDIOGRAM  2007   NORMAL     Current Outpatient Prescriptions  Medication Sig Dispense Refill  . aspirin EC 81 MG tablet Take 1 tablet (81 mg total) by mouth daily. 90 tablet 3  . atorvastatin (LIPITOR) 80 MG tablet TAKE 1 TABLET BY MOUTH EVERY DAY 30 tablet 0  . cholecalciferol (VITAMIN D) 1000 UNITS tablet Take 1,000 Units by mouth daily.    . clopidogrel (PLAVIX) 75 MG tablet Take 1 tablet (75 mg total) by mouth daily. 30 tablet 6  . diazepam (VALIUM) 2 MG tablet Take 1 tablet by mouth twice daily AS NEEDED 60 tablet 5  . fexofenadine (ALLEGRA) 180 MG tablet Take 180 mg by mouth daily as needed for allergies or rhinitis.    Marland Kitchen lisinopril (PRINIVIL,ZESTRIL) 5 MG tablet TAKE 1 TABLET BY MOUTH EVERY DAY 30 tablet 11  . Omega-3 Fatty Acids (FISH OIL) 1000 MG CAPS Take 1,000 mg by mouth daily.    .  pantoprazole (PROTONIX) 40 MG tablet TAKE 1 TABLET BY MOUTH TWICE DAILY (Patient taking differently: TAKE 1 TABLET BY MOUTH  DAILY) 60 tablet 6  . ranitidine (ZANTAC) 150 MG tablet Take 150 mg by mouth at bedtime.     No current facility-administered medications for this visit.     Allergies:   Ciprocin-fluocin-procin [fluocinolone]; Demerol [meperidine]; Fentanyl; and Penicillins    Social History:  The patient  reports that she quit smoking about 14 years ago. Her smoking use included Cigarettes. She has never used smokeless tobacco. She reports that she does not drink alcohol or use drugs.   Family History:  The patient's family history includes CAD in her mother and paternal grandfather; Heart attack in her mother; Heart disease in her mother; Heart murmur in her sister; Hyperlipidemia in her father and mother; Hypertension in her father and mother; Osteoporosis in her paternal grandmother; Pneumonia in her father.    ROS:  Please see the history of present illness.   Otherwise, review of systems are positive for rare palpitation, DOE.   All other systems are reviewed and negative.    PHYSICAL EXAM: VS:  BP 126/64   Pulse 84   Ht 5\' 4"  (1.626 m)   Wt 143 lb (64.9 kg)   BMI 24.55 kg/m  , BMI Body mass index is 24.55 kg/m. GEN: Well nourished, well developed, in no acute distress  HEENT: normal  Neck: no JVD, carotid bruits, or masses Cardiac: RRR; no murmurs, rubs, or gallops,no edema  Respiratory:  clear to auscultation bilaterally, normal work of breathing GI: soft, nontender, nondistended, + BS MS: no deformity or atrophy  Skin: warm and dry, no rash Neuro:  Strength and sensation are intact Psych: euthymic mood, full affect   EKG:   The ekg ordered today demonstrates normal sinus rhythm, small Q waves in leads 3 and AVR which are new, but likely insignificant   Recent Labs: 05/23/2016: ALT 17; Hemoglobin 13.7; Platelets 306.0 11/23/2016: BUN 12; Creatinine, Ser 0.68;  Potassium 4.9; Sodium 139   Lipid Panel    Component Value Date/Time   CHOL 151 11/23/2016 0820   TRIG 180.0 (H) 11/23/2016 0820   HDL 51.80 11/23/2016 0820   CHOLHDL 3 11/23/2016 0820   VLDL 36.0 11/23/2016 0820   LDLCALC 64 11/23/2016 0820   LDLDIRECT 107.0 04/06/2015 0859     Other studies Reviewed: Additional studies/ records that were reviewed today with results demonstrating: Cardiac cath reviewed; only minimal calcification noted.   ASSESSMENT AND PLAN:  1. Hyperlipidemia: COntinue atorvastatin.   Lipids well controlled in February. Try to keep her LDL less than 100 given her peripheral vascular disease.  No need for Zetia at this time.  2. Palpitations:  Occasional skipped beat.  Lasts 1-2 seconds.  No further w/u.  I think the heart rate of 200 registered by the watch was erroneous. It was likely due to vibration from the lawnmower as it is a push mower. I think she would've had symptoms with such a high heart rate. 3. Peripheral arterial disease: No signs of recurrent disease at this point. Continue to try to control LDL and lifestyle to minimize disease progression. Decreased aspirin dose from 325 mg to 81 mg. Continue clopidogrel.  No bleeding issues. 4. HTN: BP controlled. Continue lisinopril.  5. I asked her to try to increase the incline of her treadmill and see if this helps her build stamina.  SHe wil let us know if she has any problems.  6. PAD: innominate stent.  FOllowed at [facility].   Current medicines are reviewed at length with the patient today.  The patient concerns regarding her medicines were addressed.  The following changes have been made: As above  Labs/ tests ordered today include:  No orders of the defined types were placed in this encounter.   Recommend 150 minutes/week of aerobic exercise Low fat, low carb, high fiber diet recommended  Disposition:   FU in one year   Signed, Larae Grooms, MD  01/10/2017 4:04 PM    [facility]  [facility] [place], [place], [place]  [address] Phone: [phone]; Fax: [phone]

## 2017-01-10 NOTE — Addendum Note (Signed)
Addended by: Lianne Cure A on: 01/10/2017 09:19 AM   Modules accepted: Orders

## 2017-01-11 ENCOUNTER — Other Ambulatory Visit: Payer: Self-pay | Admitting: Family Medicine

## 2017-01-11 ENCOUNTER — Encounter: Payer: Self-pay | Admitting: Family Medicine

## 2017-01-11 NOTE — Telephone Encounter (Signed)
[facility].  RF request for atorvastatin LOV: 11/23/16 Next ov: 05/24/17 Last written:10/27/16 #30 w/ 0RF

## 2017-01-16 ENCOUNTER — Encounter: Payer: Self-pay | Admitting: Family Medicine

## 2017-01-16 DIAGNOSIS — H2513 Age-related nuclear cataract, bilateral: Secondary | ICD-10-CM | POA: Diagnosis not present

## 2017-01-16 DIAGNOSIS — H1851 Endothelial corneal dystrophy: Secondary | ICD-10-CM | POA: Diagnosis not present

## 2017-01-16 DIAGNOSIS — H43812 Vitreous degeneration, left eye: Secondary | ICD-10-CM | POA: Diagnosis not present

## 2017-03-11 ENCOUNTER — Other Ambulatory Visit: Payer: Self-pay | Admitting: Family Medicine

## 2017-03-13 NOTE — Telephone Encounter (Signed)
[facility].  RF request for diazepam LOV: 11/23/16 Next ov:  05/24/17 Last written: 08/22/16 #60 w/ 5RF  Please advise. Thanks.

## 2017-03-14 NOTE — Telephone Encounter (Signed)
Rx faxed

## 2017-04-02 ENCOUNTER — Emergency Department (INDEPENDENT_AMBULATORY_CARE_PROVIDER_SITE_OTHER): Payer: PPO

## 2017-04-02 ENCOUNTER — Emergency Department (INDEPENDENT_AMBULATORY_CARE_PROVIDER_SITE_OTHER)
Admission: EM | Admit: 2017-04-02 | Discharge: 2017-04-02 | Disposition: A | Discharge status: Home / Self Care | Payer: PPO | Source: Home / Self Care | Attending: Family Medicine | Admitting: Family Medicine

## 2017-04-02 DIAGNOSIS — S9031XA Contusion of right foot, initial encounter: Secondary | ICD-10-CM

## 2017-04-02 DIAGNOSIS — S99921A Unspecified injury of right foot, initial encounter: Secondary | ICD-10-CM | POA: Diagnosis not present

## 2017-04-02 DIAGNOSIS — M25571 Pain in right ankle and joints of right foot: Secondary | ICD-10-CM | POA: Diagnosis not present

## 2017-04-02 NOTE — ED Triage Notes (Signed)
Pt had gate drop on the right foot approx 1 hour ago.

## 2017-04-02 NOTE — Discharge Instructions (Signed)
Apply ice pack for 20 to 30 minutes, 3 to 4 times daily  Continue until pain and swelling decrease.  Wear ace wrap until swelling resolves.  Wear an open-toe sandal with firm sole for comfort.  May take Tylenol for pain.  Begin foot range of motion and stretching exercises as tolerated.

## 2017-04-02 NOTE — ED Provider Notes (Signed)
[facility]    CSN: [id number] Arrival date & time: 04/02/17  1245     History   Chief Complaint Chief Complaint  Patient presents with  . Foot Injury    HPI [name] is a 66 y.o. female.   Patient dropped a heavy gate on the dorsum of her right foot about one hour ago.   The history is provided by the patient.  Foot Injury  Location:  Foot Time since incident:  1 hour Injury: yes   Mechanism of injury: crush   Crush:    Mechanism:  Falling object Foot location:  Dorsum of R foot Pain details:    Quality:  Aching   Radiates to:  Does not radiate   Severity:  Moderate   Onset quality:  Sudden   Duration:  1 hour   Timing:  Constant   Progression:  Unchanged Chronicity:  New Dislocation: no   Foreign body present:  No foreign bodies Prior injury to area:  No Relieved by:  None tried Worsened by:  Bearing weight Ineffective treatments:  None tried Associated symptoms: stiffness and swelling   Associated symptoms: no decreased ROM, no muscle weakness, no numbness and no tingling     Past Medical History:  Diagnosis Date  . Anxiety   . Carotid artery occlusion   . Essential hypertension, benign 05/22/2014  . Melanoma ([place])   . Mitral valve prolapse   . Mixed hyperlipidemia 05/22/2014  . Osteopenia 06/06/2016   DEXA T-score -1.5.  Repeat DEXA 2 yrs.  . Peripheral vascular disease, unspecified ([place]) 05/22/2014   innominate and carotid.  U/S f/u 12/2016 showed no signif change compared to 2017--vascular recommended repeat 1 yr.  . Reflux esophagitis   . Stroke [facility]) 2006   Mini  Stroke- not major  . Tobacco abuse, in remission    quit 2009    Patient Active Problem List   Diagnosis Date Noted  . Mixed hyperlipidemia 05/22/2014  . Essential hypertension, benign 05/22/2014  . Peripheral vascular disease ([place]) 05/22/2014  . Aftercare following surgery of the circulatory system, [place] 09/17/2013  . Carotid artery disease without cerebral  infarction ([place]) 08/06/2012    Past Surgical History:  Procedure Laterality Date  . ABDOMINAL HYSTERECTOMY  1997  . CARDIAC CATHETERIZATION  01/2012   NORMAL  . CAROTID ENDARTERECTOMY  08/21/2007   right - Dr. Amedeo Plenty  . CHOLECYSTECTOMY  2000  . DEXA  06/06/2016   T-score -1.5.  Repeat 2 yrs.  . Incision and drainage of left thenar space abscess  02/23/15   s/p cat bite (Dr. Amedeo Plenty)  . innominate stent  2007   12/2014 u/s showed patent stent in innominate and L common carotid.  L prox int carotid 40-59% stenosis  . Left common carotid stent  2006   "                    "                  "                          "                             "  . TRANSTHORACIC ECHOCARDIOGRAM  2007   NORMAL    OB History    No data available  Home Medications    Prior to Admission medications   Medication Sig Start Date End Date Taking? Authorizing Provider  aspirin EC 81 MG tablet Take 1 tablet (81 mg total) by mouth daily. 06/03/15  Yes Jettie Booze, MD  atorvastatin (LIPITOR) 80 MG tablet TAKE 1 TABLET BY MOUTH EVERY DAY 01/11/17  Yes McGowen, Adrian Blackwater, MD  cholecalciferol (VITAMIN D) 1000 UNITS tablet Take 1,000 Units by mouth daily.   Yes [provider]  clopidogrel (PLAVIX) 75 MG tablet Take 1 tablet (75 mg total) by mouth daily. 09/29/16  Yes McGowen, Adrian Blackwater, MD  diazepam (VALIUM) 2 MG tablet take 1 tablet BY MOUTH TWICE DAILY AS NEEDED 03/13/17  Yes McGowen, Adrian Blackwater, MD  fexofenadine (ALLEGRA) 180 MG tablet Take 180 mg by mouth daily as needed for allergies or rhinitis.   Yes [provider]  lisinopril (PRINIVIL,ZESTRIL) 5 MG tablet TAKE 1 TABLET BY MOUTH EVERY DAY 06/06/16  Yes McGowen, Adrian Blackwater, MD  Omega-3 Fatty Acids (FISH OIL) 1000 MG CAPS Take 1,000 mg by mouth daily.   Yes [provider]  pantoprazole (PROTONIX) 40 MG tablet TAKE 1 TABLET BY MOUTH TWICE DAILY Patient taking differently: TAKE 1 TABLET BY MOUTH  DAILY 09/29/16  Yes McGowen,  Adrian Blackwater, MD  ranitidine (ZANTAC) 150 MG tablet Take 150 mg by mouth at bedtime.   Yes [provider]    Family History Family History  Problem Relation Age of Onset  . Heart attack Mother   . CAD Mother   . Heart disease Mother        Before age 35  . Hypertension Mother   . Hyperlipidemia Mother   . Hypertension Father   . Pneumonia Father   . Hyperlipidemia Father   . Heart murmur Sister   . Osteoporosis Paternal Grandmother   . CAD Paternal Grandfather     Social History Social History  Substance Use Topics  . Smoking status: Former Smoker    Types: Cigarettes    Quit date: 08/06/2002  . Smokeless tobacco: Never Used  . Alcohol use No     Allergies   Ciprocin-fluocin-procin [fluocinolone]; Demerol [meperidine]; Fentanyl; and Penicillins   Review of Systems Review of Systems  Musculoskeletal: Positive for stiffness.  All other systems reviewed and are negative.    Physical Exam Triage Vital Signs ED Triage Vitals  Enc Vitals Group     BP 04/02/17 1301 (!) 143/85     Pulse --      Resp 04/02/17 1301 16     Temp 04/02/17 1301 97.9 F (36.6 C)     Temp Source 04/02/17 1301 Oral     SpO2 04/02/17 1301 99 %     Weight 04/02/17 1301 141 lb 12 oz (64.3 kg)     Height 04/02/17 1301 5\' 4"  (1.626 m)     Head Circumference --      Peak Flow --      Pain Score 04/02/17 1302 9     Pain Loc --      Pain Edu? --      Excl. in [place]? --    No data found.   Updated Vital Signs BP (!) 143/85 (BP Location: Left Arm)   Temp 97.9 F (36.6 C) (Oral)   Resp 16   Ht 5\' 4"  (1.626 m)   Wt 141 lb 12 oz (64.3 kg)   SpO2 99%   BMI 24.33 kg/m   Visual Acuity Right Eye Distance:  Left Eye Distance:   Bilateral Distance:    Right Eye Near:   Left Eye Near:    Bilateral Near:     Physical Exam  Constitutional: She appears well-developed and well-nourished. No distress.  HENT:  Head: Atraumatic.  Eyes: Pupils are equal, round, and reactive to light.    Neck: Normal range of motion.  Cardiovascular: Normal rate.   Pulmonary/Chest: Effort normal.  Musculoskeletal:       Feet:  Right foot has hematoma, swelling, and tenderness to palpation dorsally over first, second, and third distal metatarsals.  Toes have good range of motion.  Distal neurovascular function is intact.   Neurological: She is alert.  Skin: Skin is warm and dry.  Nursing note and vitals reviewed.    UC Treatments / Results  Labs (all labs ordered are listed, but only abnormal results are displayed) Labs Reviewed - No data to display  EKG  EKG Interpretation None       Radiology Dg Foot Complete Right  Result Date: 04/02/2017 CLINICAL DATA:  Patient with injury to the top of the right foot. Initial encounter. EXAM: RIGHT FOOT COMPLETE - 3+ VIEW COMPARISON:  None. FINDINGS: Normal anatomic alignment. No evidence for acute fracture or dislocation. Regional soft tissues are unremarkable. IMPRESSION: No acute osseous abnormality. Electronically Signed   By: Lovey Newcomer M.D.   On: 04/02/2017 13:29    Procedures Procedures (including critical care time)  Medications Ordered in UC Medications - No data to display   Initial Impression / Assessment and Plan / UC Course  I have reviewed the triage vital signs and the nursing notes.  Pertinent labs & imaging results that were available during my care of the patient were reviewed by me and considered in my medical decision making (see chart for details).    Ace wrap applied. Apply ice pack for 20 to 30 minutes, 3 to 4 times daily  Continue until pain and swelling decrease.  Wear ace wrap until swelling resolves.  Wear an open-toe sandal with firm sole for comfort.  May take Tylenol for pain.  Begin foot range of motion and stretching exercises as tolerated. Followup with Dr. Aundria Mems or Dr. Lynne Leader ([facility]) if not improving about two weeks.     Final Clinical Impressions(s) / UC  Diagnoses   Final diagnoses:  Contusion of right foot, initial encounter    New Prescriptions New Prescriptions   No medications on file     Laura Nicolas, MD 04/07/17 1446

## 2017-04-25 ENCOUNTER — Other Ambulatory Visit: Payer: Self-pay | Admitting: Family Medicine

## 2017-04-25 NOTE — Telephone Encounter (Signed)
[facility].  RF request for pantoprazole LOV: 11/23/16 Next ov: 05/24/17 Last written: 09/29/16 #60 w/ 6RF  RF request for clopidogrel  Last written: 09/29/16 #30 w/ 6RF

## 2017-04-26 DIAGNOSIS — R0989 Other specified symptoms and signs involving the circulatory and respiratory systems: Secondary | ICD-10-CM

## 2017-04-26 HISTORY — DX: Other specified symptoms and signs involving the circulatory and respiratory systems: R09.89

## 2017-05-24 ENCOUNTER — Encounter: Payer: Self-pay | Admitting: Family Medicine

## 2017-05-24 ENCOUNTER — Ambulatory Visit (INDEPENDENT_AMBULATORY_CARE_PROVIDER_SITE_OTHER): Payer: PPO | Admitting: Family Medicine

## 2017-05-24 VITALS — BP 137/78 | HR 72 | Temp 97.7°F | Resp 16 | Ht 64.0 in | Wt 142.5 lb

## 2017-05-24 DIAGNOSIS — R0989 Other specified symptoms and signs involving the circulatory and respiratory systems: Secondary | ICD-10-CM | POA: Diagnosis not present

## 2017-05-24 DIAGNOSIS — I739 Peripheral vascular disease, unspecified: Secondary | ICD-10-CM

## 2017-05-24 DIAGNOSIS — Z0000 Encounter for general adult medical examination without abnormal findings: Secondary | ICD-10-CM | POA: Diagnosis not present

## 2017-05-24 DIAGNOSIS — Z23 Encounter for immunization: Secondary | ICD-10-CM | POA: Diagnosis not present

## 2017-05-24 DIAGNOSIS — E7800 Pure hypercholesterolemia, unspecified: Secondary | ICD-10-CM | POA: Diagnosis not present

## 2017-05-24 DIAGNOSIS — R7301 Impaired fasting glucose: Secondary | ICD-10-CM | POA: Diagnosis not present

## 2017-05-24 DIAGNOSIS — I10 Essential (primary) hypertension: Secondary | ICD-10-CM

## 2017-05-24 LAB — COMPREHENSIVE METABOLIC PANEL
ALBUMIN: 4.4 g/dL (ref 3.5–5.2)
ALT: 22 U/L (ref 0–35)
AST: 22 U/L (ref 0–37)
Alkaline Phosphatase: 96 U/L (ref 39–117)
BUN: 18 mg/dL (ref 6–23)
CALCIUM: 10.2 mg/dL (ref 8.4–10.5)
CHLORIDE: 105 meq/L (ref 96–112)
CO2: 30 mEq/L (ref 19–32)
CREATININE: 0.71 mg/dL (ref 0.40–1.20)
GFR: 87.53 mL/min (ref >60.00)
Glucose, Bld: 94 mg/dL (ref 70–99)
POTASSIUM: 5.3 meq/L — AB (ref 3.5–5.1)
Sodium: 140 mEq/L (ref 135–145)
Total Bilirubin: 1.3 mg/dL — ABNORMAL HIGH (ref 0.2–1.2)
Total Protein: 7.1 g/dL (ref 6.0–8.3)

## 2017-05-24 LAB — CBC WITH DIFFERENTIAL/PLATELET
BASOS PCT: 0.9 % (ref 0.0–3.0)
Basophils Absolute: 0.1 10*3/uL (ref 0.0–0.1)
EOS ABS: 0.2 10*3/uL (ref 0.0–0.7)
EOS PCT: 3.4 % (ref 0.0–5.0)
HEMATOCRIT: 42.4 % (ref 36.0–46.0)
HEMOGLOBIN: 13.7 g/dL (ref 12.0–15.0)
LYMPHS PCT: 40.1 % (ref 12.0–46.0)
Lymphs Abs: 2.3 10*3/uL (ref 0.7–4.0)
MCHC: 32.3 g/dL (ref 30.0–36.0)
MCV: 88.4 fl (ref 78.0–100.0)
MONOS PCT: 10.1 % (ref 3.0–12.0)
Monocytes Absolute: 0.6 10*3/uL (ref 0.1–1.0)
NEUTROS ABS: 2.6 10*3/uL (ref 1.4–7.7)
Neutrophils Relative %: 45.5 % (ref 43.0–77.0)
PLATELETS: 312 10*3/uL (ref 150.0–400.0)
RBC: 4.79 Mil/uL (ref 3.87–5.11)
RDW: 15.8 % — AB (ref 11.5–15.5)
WBC: 5.8 10*3/uL (ref 4.0–10.5)

## 2017-05-24 LAB — HEMOGLOBIN A1C: HEMOGLOBIN A1C: 6.3 % (ref 4.6–6.5)

## 2017-05-24 LAB — LIPID PANEL
Cholesterol: 163 mg/dL (ref 0–200)
HDL: 50.1 mg/dL (ref >39.00)
LDL CALC: 78 mg/dL (ref 0–99)
NonHDL: 113.07
Total CHOL/HDL Ratio: 3
Triglycerides: 175 mg/dL — ABNORMAL HIGH (ref 0.0–149.0)
VLDL: 35 mg/dL (ref 0.0–40.0)

## 2017-05-24 LAB — TSH: TSH: 1.37 u[IU]/mL (ref 0.35–4.50)

## 2017-05-24 NOTE — Addendum Note (Signed)
Addended by: Onalee Hua on: 05/24/2017 09:15 AM   Modules accepted: Orders

## 2017-05-24 NOTE — Progress Notes (Signed)
Office Note 05/24/2017  CC:  Chief Complaint  Patient presents with  . Annual Exam    Pt is fasting.    HPI:  [name] is a 66 y.o. female who is here for annual health maintenance exam. Emotionally she is doing better.  GYN MD: no No hx of abnormal paps, chooses to defer any further cerv ca scr.  Eyes: exam UTD--returns 06/2017 to recheck cataract. Dental: not UTD. Exercise: walks on treadmill 40 min daily. Diet: avoiding junk food, trying to eat more fresh fruits/veggies, salads.   Past Medical History:  Diagnosis Date  . Anxiety   . Carotid artery occlusion   . Essential hypertension, benign 05/22/2014  . Melanoma ([place])   . Mitral valve prolapse   . Mixed hyperlipidemia 05/22/2014  . Osteopenia 06/06/2016   DEXA T-score -1.5.  Repeat DEXA 2 yrs.  . Peripheral vascular disease, unspecified ([place]) 05/22/2014   innominate and carotid.  U/S f/u 12/2016 showed no signif change compared to 2017--vascular recommended repeat 1 yr.  . Reflux esophagitis   . Stroke [facility]) 2006   Mini  Stroke- not major  . Tobacco abuse, in remission    quit 2009    Past Surgical History:  Procedure Laterality Date  . ABDOMINAL HYSTERECTOMY  1997  . CARDIAC CATHETERIZATION  01/2012   NORMAL  . CAROTID ENDARTERECTOMY  08/21/2007   right - Dr. Amedeo Plenty  . CHOLECYSTECTOMY  2000  . DEXA  06/06/2016   T-score -1.5.  Repeat 2 yrs.  . Incision and drainage of left thenar space abscess  02/23/15   s/p cat bite (Dr. Amedeo Plenty)  . innominate stent  2007   12/2014 u/s showed patent stent in innominate and L common carotid.  L prox int carotid 40-59% stenosis  . Left common carotid stent  2006   "                    "                  "                          "                             "  . TRANSTHORACIC ECHOCARDIOGRAM  2007   NORMAL    Family History  Problem Relation Age of Onset  . Heart attack Mother   . CAD Mother   . Heart disease Mother        Before age 53  . Hypertension  Mother   . Hyperlipidemia Mother   . Hypertension Father   . Pneumonia Father   . Hyperlipidemia Father   . Heart murmur Sister   . Osteoporosis Paternal Grandmother   . CAD Paternal Grandfather     Social History   Social History  . Marital status: Married    Spouse name: N/A  . Number of children: N/A  . Years of education: N/A   Occupational History  . Not on file.   Social History Main Topics  . Smoking status: Former Smoker    Types: Cigarettes    Quit date: 08/06/2002  . Smokeless tobacco: Never Used  . Alcohol use No  . Drug use: No  . Sexual activity: Not on file   Other Topics Concern  . Not on file   Social History Narrative  Widowed as of 2016 (husband died of viral illness while out of town in [place], [place]), has one daughter.   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   Tobacco: 20 pack-yr hx, quit 2009.   Alcohol: rare.   No hx of alc or drug problems.   Exercise: walks 2.29miles on treadmill daily.    Outpatient Medications Prior to Visit  Medication Sig Dispense Refill  . aspirin EC 81 MG tablet Take 1 tablet (81 mg total) by mouth daily. 90 tablet 3  . atorvastatin (LIPITOR) 80 MG tablet TAKE 1 TABLET BY MOUTH EVERY DAY 30 tablet 6  . cholecalciferol (VITAMIN D) 1000 UNITS tablet Take 1,000 Units by mouth daily.    . clopidogrel (PLAVIX) 75 MG tablet Take 1 tablet (75 mg total) by mouth daily. 30 tablet 5  . diazepam (VALIUM) 2 MG tablet take 1 tablet BY MOUTH TWICE DAILY AS NEEDED 60 tablet 5  . fexofenadine (ALLEGRA) 180 MG tablet Take 180 mg by mouth daily as needed for allergies or rhinitis.    Marland Kitchen lisinopril (PRINIVIL,ZESTRIL) 5 MG tablet TAKE 1 TABLET BY MOUTH EVERY DAY 30 tablet 11  . Omega-3 Fatty Acids (FISH OIL) 1000 MG CAPS Take 1,000 mg by mouth daily.    . pantoprazole (PROTONIX) 40 MG tablet TAKE 1 TABLET BY MOUTH TWICE DAILY 60 tablet 5  . ranitidine (ZANTAC) 150 MG tablet Take 150 mg by  mouth at bedtime.     No facility-administered medications prior to visit.     Allergies  Allergen Reactions  . Ciprocin-Fluocin-Procin [Fluocinolone] Nausea And Vomiting  . Demerol [Meperidine] Nausea And Vomiting  . Fentanyl Nausea And Vomiting    Pt states all pain medications cause N/V for her  . Penicillins Hives    ROS Review of Systems  Constitutional: Negative for appetite change, chills, fatigue and fever.  HENT: Negative for congestion, dental problem, ear pain and sore throat.   Eyes: Negative for discharge, redness and visual disturbance.  Respiratory: Negative for cough, chest tightness, shortness of breath and wheezing.   Cardiovascular: Negative for chest pain, palpitations and leg swelling.  Gastrointestinal: Negative for abdominal pain, blood in stool, diarrhea, nausea and vomiting.  Genitourinary: Negative for difficulty urinating, dysuria, flank pain, frequency, hematuria and urgency.  Musculoskeletal: Positive for back pain (today, bent over and strained back.). Negative for arthralgias, joint swelling, myalgias and neck stiffness.  Skin: Negative for pallor and rash.  Neurological: Negative for dizziness, speech difficulty, weakness and headaches.  Hematological: Negative for adenopathy. Does not bruise/bleed easily.  Psychiatric/Behavioral: Negative for confusion and sleep disturbance. The patient is not nervous/anxious.     PE; Blood pressure 137/78, pulse 72, temperature 97.7 F (36.5 C), temperature source Oral, resp. rate 16, height 5\' 4"  (1.626 m), weight 142 lb 8 oz (64.6 kg), SpO2 99 %. Gen: Alert, well appearing.  Patient is oriented to person, place, time, and situation. AFFECT: pleasant, lucid thought and speech. ENT: Ears: EACs clear, normal epithelium.  TMs with good light reflex and landmarks bilaterally.  Eyes: no injection, icteris, swelling, or exudate.  EOMI, PERRLA. Nose: no drainage or turbinate edema/swelling.  No injection or focal  lesion.  Mouth: lips without lesion/swelling.  Oral mucosa pink and moist.  Dentition intact and without obvious caries or gingival swelling.  Oropharynx without erythema, exudate, or swelling.  Neck: supple/nontender.  No LAD, mass, or TM.  Carotid pulses 2+ bilaterally, soft bruit heard on each  side. CV: RRR, no m/r/g.   LUNGS: CTA bilat, nonlabored resps, good aeration in all lung fields. ABD: soft, NT, ND, BS normal.  No hepatospenomegaly or mass.  Soft abd bruit heard above level of umbilicus. EXT: no clubbing, cyanosis, or edema.  Musculoskeletal: no joint swelling, erythema, warmth, or tenderness.  ROM of all joints intact. Skin - no sores or suspicious lesions or rashes or color changes   Pertinent labs:  No results found for: TSH Lab Results  Component Value Date   WBC 6.7 05/23/2016   HGB 13.7 05/23/2016   HCT 41.5 05/23/2016   MCV 85.8 05/23/2016   PLT 306.0 05/23/2016   Lab Results  Component Value Date   CREATININE 0.68 11/23/2016   BUN 12 11/23/2016   NA 139 11/23/2016   K 4.9 11/23/2016   CL 105 11/23/2016   CO2 29 11/23/2016   Lab Results  Component Value Date   ALT 17 05/23/2016   AST 17 05/23/2016   ALKPHOS 100 05/23/2016   BILITOT 1.2 05/23/2016   Lab Results  Component Value Date   CHOL 151 11/23/2016   Lab Results  Component Value Date   HDL 51.80 11/23/2016   Lab Results  Component Value Date   LDLCALC 64 11/23/2016   Lab Results  Component Value Date   TRIG 180.0 (H) 11/23/2016   Lab Results  Component Value Date   CHOLHDL 3 11/23/2016   Lab Results  Component Value Date   HGBA1C 5.9 04/06/2015   ASSESSMENT AND PLAN:   1) Health maintenance exam: Reviewed age and gender appropriate health maintenance issues (prudent diet, regular exercise, health risks of tobacco and excessive alcohol, use of seatbelts, fire alarms in home, use of sunscreen).  Also reviewed age and gender appropriate health screening as well as vaccine  recommendations. Vaccines: pneumovax 23 due--given today , shingrix discussed--she will wait until we have another supply in early 2019.. Labs:HP + HbA1c (IFG). Osteoporosis screening: repeat DEXA due 05/2018. Colon ca screening: pt prefers cologuard--given to pt today.   Cerv ca screening: pt declines any further screening paps. Breast ca screening: due for annual mammogram 05/2017-she will set this up at [facility].  2) Abdominal bruit--++ hx of PAD (carotids and innominate).  Ordered US aorta today.  An After Visit Summary was printed and given to the patient.  FOLLOW UP:  Return in about 6 months (around 11/23/2017) for routine chronic illness f/u.  Signed:  Crissie Sickles, MD           05/24/2017

## 2017-05-24 NOTE — Patient Instructions (Signed)

## 2017-05-25 ENCOUNTER — Other Ambulatory Visit: Payer: Self-pay | Admitting: Family Medicine

## 2017-05-25 DIAGNOSIS — R7303 Prediabetes: Secondary | ICD-10-CM

## 2017-05-26 ENCOUNTER — Telehealth: Payer: Self-pay | Admitting: Family Medicine

## 2017-05-26 ENCOUNTER — Ambulatory Visit (HOSPITAL_BASED_OUTPATIENT_CLINIC_OR_DEPARTMENT_OTHER)
Admission: RE | Admit: 2017-05-26 | Discharge: 2017-05-26 | Disposition: A | Discharge status: Home / Self Care | Payer: PPO | Source: Ambulatory Visit | Attending: Family Medicine | Admitting: Family Medicine

## 2017-05-26 DIAGNOSIS — I700 Atherosclerosis of aorta: Secondary | ICD-10-CM | POA: Insufficient documentation

## 2017-05-26 DIAGNOSIS — R0989 Other specified symptoms and signs involving the circulatory and respiratory systems: Secondary | ICD-10-CM | POA: Insufficient documentation

## 2017-05-26 DIAGNOSIS — I739 Peripheral vascular disease, unspecified: Secondary | ICD-10-CM | POA: Insufficient documentation

## 2017-05-26 NOTE — Telephone Encounter (Signed)
Patient informed that as soon as Dr. Anitra Lauth reviews results and releases to [place], we will call her and inform her of results.

## 2017-05-26 NOTE — Telephone Encounter (Signed)
Patient calling to inquire if results are back from aorta u/s she had done today.

## 2017-05-27 DIAGNOSIS — Z1211 Encounter for screening for malignant neoplasm of colon: Secondary | ICD-10-CM

## 2017-05-27 HISTORY — DX: Encounter for screening for malignant neoplasm of colon: Z12.11

## 2017-05-29 ENCOUNTER — Encounter: Payer: Self-pay | Admitting: Family Medicine

## 2017-05-30 NOTE — Telephone Encounter (Signed)
Patient notified and verbalized understanding. 

## 2017-05-30 NOTE — Telephone Encounter (Signed)
Reassure her that her aortic ultrasound showed NO aneurism.  No further testing is needed.  -thx

## 2017-05-30 NOTE — Telephone Encounter (Signed)
Patient is checking to see if results are available.

## 2017-06-05 DIAGNOSIS — Z1212 Encounter for screening for malignant neoplasm of rectum: Secondary | ICD-10-CM | POA: Diagnosis not present

## 2017-06-05 DIAGNOSIS — Z1211 Encounter for screening for malignant neoplasm of colon: Secondary | ICD-10-CM | POA: Diagnosis not present

## 2017-06-09 LAB — COLOGUARD: Cologuard: NEGATIVE

## 2017-06-12 ENCOUNTER — Telehealth: Payer: Self-pay | Admitting: Family Medicine

## 2017-06-12 ENCOUNTER — Other Ambulatory Visit: Payer: Self-pay | Admitting: Family Medicine

## 2017-06-12 ENCOUNTER — Encounter: Payer: Self-pay | Admitting: Family Medicine

## 2017-06-12 DIAGNOSIS — Z1231 Encounter for screening mammogram for malignant neoplasm of breast: Secondary | ICD-10-CM

## 2017-06-12 NOTE — Telephone Encounter (Signed)
Pls notify pt that her cologuard stool test for colon cancer screening came back NEGATIVE.  This is good. We'll repeat this test in 3 yrs.-thx

## 2017-06-13 ENCOUNTER — Other Ambulatory Visit: Payer: Self-pay | Admitting: Family Medicine

## 2017-06-13 NOTE — Telephone Encounter (Signed)
Patient notified and verbalized understanding. 

## 2017-06-19 ENCOUNTER — Ambulatory Visit
Admission: RE | Admit: 2017-06-19 | Discharge: 2017-06-19 | Disposition: A | Discharge status: Home / Self Care | Payer: PPO | Source: Ambulatory Visit | Attending: Family Medicine | Admitting: Family Medicine

## 2017-06-19 DIAGNOSIS — Z1231 Encounter for screening mammogram for malignant neoplasm of breast: Secondary | ICD-10-CM | POA: Diagnosis not present

## 2017-07-09 NOTE — Progress Notes (Signed)
Cardiology Office Note   Date:  07/10/2017   ID:  [name], DOB 1951/01/03, MRN [id number]  PCP:  Tammi Sou, MD    No chief complaint on file.  PAD  Wt Readings from Last 3 Encounters:  07/10/17 142 lb 6.4 oz (64.6 kg)  05/24/17 142 lb 8 oz (64.6 kg)  04/02/17 141 lb 12 oz (64.3 kg)       History of Present Illness: [name] is a 66 y.o. female   with a family history of significant coronary artery disease. She has a history of early vascular disease and difficult to control hyperlipidemia.  She has had significant carotid disease in the past with stents placed in the right innominate artery 2007) and left common carotid artery (2006). Right CEA in 2008.  She had a cardiac cath in 2013 showing no significant coronary artery disease and only minimal coronary calcification.    Her husband passed away in 09-29-15. He was at work in [place] when he died from a viral illness.  It was very quick.    Denies : Chest pain. Dizziness. Leg edema. Nitroglycerin use. Orthopnea. Palpitations. Paroxysmal nocturnal dyspnea. Shortness of breath. Syncope.   Getting her routine screening.  SHe is trying to eat healthy.  Not eating as well since her husband passed away.  She has trouble cooking for 1 person.     Past Medical History:  Diagnosis Date  . Abdominal bruit 04/2017   Aortic u/s showed NO ANEURISM.  Marland Kitchen Anxiety   . Carotid artery occlusion   . Colon cancer screening 05/2017   Cologuard NEG--repeat 3 yrs.  . Essential hypertension, benign 05/22/2014  . Melanoma ([place])   . Mitral valve prolapse   . Mixed hyperlipidemia 05/22/2014  . Osteopenia 06/06/2016   DEXA T-score -1.5.  Repeat DEXA 2 yrs.  . Peripheral vascular disease, unspecified ([place]) 05/22/2014   innominate and carotid.  U/S f/u 12/2016 showed no signif change compared to 2017--vascular recommended repeat 1 yr.  . Prediabetes 2018   A1c 6.3%  . Reflux esophagitis   . Stroke [facility]) 2006   Mini  Stroke- not major  . Tobacco abuse, in remission    quit 2009    Past Surgical History:  Procedure Laterality Date  . ABDOMINAL HYSTERECTOMY  1997  . CARDIAC CATHETERIZATION  01/2012   NORMAL  . CAROTID ENDARTERECTOMY  08/21/2007   right - Dr. Amedeo Plenty  . CHOLECYSTECTOMY  2000  . DEXA  06/06/2016   T-score -1.5.  Repeat 2 yrs.  . Incision and drainage of left thenar space abscess  02/23/15   s/p cat bite (Dr. Amedeo Plenty)  . innominate stent  2007   12/2014 u/s showed patent stent in innominate and L common carotid.  L prox int carotid 40-59% stenosis  . Left common carotid stent  2006   "                    "                  "                          "                             "  . TRANSTHORACIC ECHOCARDIOGRAM  2007   NORMAL  Current Outpatient Prescriptions  Medication Sig Dispense Refill  . aspirin EC 81 MG tablet Take 1 tablet (81 mg total) by mouth daily. 90 tablet 3  . atorvastatin (LIPITOR) 80 MG tablet TAKE 1 TABLET BY MOUTH EVERY DAY 30 tablet 6  . cholecalciferol (VITAMIN D) 1000 UNITS tablet Take 1,000 Units by mouth daily.    . clopidogrel (PLAVIX) 75 MG tablet Take 1 tablet (75 mg total) by mouth daily. 30 tablet 5  . diazepam (VALIUM) 2 MG tablet take 1 tablet BY MOUTH TWICE DAILY AS NEEDED 60 tablet 5  . fexofenadine (ALLEGRA) 180 MG tablet Take 180 mg by mouth daily as needed for allergies or rhinitis.    Marland Kitchen lisinopril (PRINIVIL,ZESTRIL) 5 MG tablet TAKE 1 TABLET BY MOUTH EVERY DAY 30 tablet 11  . Omega-3 Fatty Acids (FISH OIL) 1000 MG CAPS Take 2,000 mg by mouth daily.     . pantoprazole (PROTONIX) 40 MG tablet TAKE 1 TABLET BY MOUTH TWICE DAILY 60 tablet 5   No current facility-administered medications for this visit.     Allergies:   Ciprocin-fluocin-procin [fluocinolone]; Demerol [meperidine]; Fentanyl; and Penicillins    Social History:  The patient  reports that she quit smoking about 14 years ago. Her smoking use included Cigarettes. She has never  used smokeless tobacco. She reports that she does not drink alcohol or use drugs.   Family History:  The patient's family history includes CAD in her mother and paternal grandfather; Heart attack in her mother; Heart disease in her mother; Heart murmur in her sister; Hyperlipidemia in her father and mother; Hypertension in her father and mother; Osteoporosis in her paternal grandmother; Pneumonia in her father.    ROS:  Please see the history of present illness.   Otherwise, review of systems are positive for .   All other systems are reviewed and negative.    PHYSICAL EXAM: VS:  BP 120/62   Pulse 68   Ht 5\' 4"  (1.626 m)   Wt 142 lb 6.4 oz (64.6 kg)   SpO2 98%   BMI 24.44 kg/m  , BMI Body mass index is 24.44 kg/m. GEN: Well nourished, well developed, in no acute distress  HEENT: normal  Neck: no JVD, right carotid bruit, no masses Cardiac: RRR; no murmurs, rubs, or gallops,no edema  Respiratory:  clear to auscultation bilaterally, normal work of breathing GI: soft, nontender, nondistended, + BS MS: no deformity or atrophy ; palpable radial pulses bilaterally Skin: warm and dry, no rash Neuro:  Strength and sensation are intact Psych: euthymic mood, full affect    Recent Labs: 05/24/2017: ALT 22; BUN 18; Creatinine, Ser 0.71; Hemoglobin 13.7; Platelets 312.0; Potassium 5.3; Sodium 140; TSH 1.37   Lipid Panel    Component Value Date/Time   CHOL 163 05/24/2017 0837   TRIG 175.0 (H) 05/24/2017 0837   HDL 50.10 05/24/2017 0837   CHOLHDL 3 05/24/2017 0837   VLDL 35.0 05/24/2017 0837   LDLCALC 78 05/24/2017 0837   LDLDIRECT 107.0 04/06/2015 0859     Other studies Reviewed: Additional studies/ records that were reviewed today with results demonstrating: moderate carotid disease noted, no AAA on aortic u/s.   ASSESSMENT AND PLAN:  1. PAD: h/o innominate stent.  No arm claudication.  No cardiac sx.  COntinue regular exercise.  Shortness of breath from a few months ago has  improved. 2. Hyperlipidemia: Well controlled in 8/18 as above. Healthy diet recommended as well.  3. HTN: Controlled.  Continue current meds 4.  Aortic atherosclerosis: No AAA in 2018.    Current medicines are reviewed at length with the patient today.  The patient concerns regarding her medicines were addressed.  The following changes have been made:  No change  Labs/ tests ordered today include:  No orders of the defined types were placed in this encounter.   Recommend 150 minutes/week of aerobic exercise Low fat, low carb, high fiber diet recommended  Disposition:   FU in 1 year   Signed, Larae Grooms, MD  07/10/2017 8:51 AM    [facility] [address], [place], [place]  [address] Phone: [phone]; Fax: [phone]

## 2017-07-10 ENCOUNTER — Encounter: Payer: Self-pay | Admitting: Interventional Cardiology

## 2017-07-10 ENCOUNTER — Other Ambulatory Visit: Payer: Self-pay | Admitting: Family Medicine

## 2017-07-10 ENCOUNTER — Ambulatory Visit (INDEPENDENT_AMBULATORY_CARE_PROVIDER_SITE_OTHER): Payer: PPO | Admitting: Interventional Cardiology

## 2017-07-10 VITALS — BP 120/62 | HR 68 | Ht 64.0 in | Wt 142.4 lb

## 2017-07-10 DIAGNOSIS — I10 Essential (primary) hypertension: Secondary | ICD-10-CM

## 2017-07-10 DIAGNOSIS — I700 Atherosclerosis of aorta: Secondary | ICD-10-CM | POA: Diagnosis not present

## 2017-07-10 DIAGNOSIS — I779 Disorder of arteries and arterioles, unspecified: Secondary | ICD-10-CM

## 2017-07-10 DIAGNOSIS — I739 Peripheral vascular disease, unspecified: Secondary | ICD-10-CM | POA: Diagnosis not present

## 2017-07-10 DIAGNOSIS — E782 Mixed hyperlipidemia: Secondary | ICD-10-CM

## 2017-07-10 NOTE — Patient Instructions (Signed)

## 2017-07-27 ENCOUNTER — Other Ambulatory Visit: Payer: Self-pay | Admitting: Family Medicine

## 2017-07-27 NOTE — Telephone Encounter (Signed)
[place]  RF request for valium LOV: 05/24/17 Next ov: 11/23/17 Last written: 03/13/17 #60 w/ 5RF  Please advise. Thanks.

## 2017-07-27 NOTE — Telephone Encounter (Signed)
Rx faxed to requesting pharmacy.

## 2017-08-04 DIAGNOSIS — H2513 Age-related nuclear cataract, bilateral: Secondary | ICD-10-CM | POA: Diagnosis not present

## 2017-08-04 DIAGNOSIS — H1851 Endothelial corneal dystrophy: Secondary | ICD-10-CM | POA: Diagnosis not present

## 2017-08-04 DIAGNOSIS — H0015 Chalazion left lower eyelid: Secondary | ICD-10-CM | POA: Diagnosis not present

## 2017-08-04 DIAGNOSIS — H43812 Vitreous degeneration, left eye: Secondary | ICD-10-CM | POA: Diagnosis not present

## 2017-10-03 DIAGNOSIS — H2511 Age-related nuclear cataract, right eye: Secondary | ICD-10-CM | POA: Diagnosis not present

## 2017-10-03 DIAGNOSIS — H1851 Endothelial corneal dystrophy: Secondary | ICD-10-CM | POA: Diagnosis not present

## 2017-10-03 DIAGNOSIS — H0015 Chalazion left lower eyelid: Secondary | ICD-10-CM | POA: Diagnosis not present

## 2017-10-03 DIAGNOSIS — H43812 Vitreous degeneration, left eye: Secondary | ICD-10-CM | POA: Diagnosis not present

## 2017-10-03 DIAGNOSIS — H2513 Age-related nuclear cataract, bilateral: Secondary | ICD-10-CM | POA: Diagnosis not present

## 2017-10-23 ENCOUNTER — Other Ambulatory Visit: Payer: Self-pay | Admitting: Family Medicine

## 2017-11-02 DIAGNOSIS — H2511 Age-related nuclear cataract, right eye: Secondary | ICD-10-CM | POA: Diagnosis not present

## 2017-11-20 ENCOUNTER — Ambulatory Visit: Payer: PPO | Admitting: Family Medicine

## 2017-11-23 ENCOUNTER — Ambulatory Visit: Payer: PPO | Admitting: Family Medicine

## 2017-11-28 ENCOUNTER — Ambulatory Visit (INDEPENDENT_AMBULATORY_CARE_PROVIDER_SITE_OTHER): Payer: PPO | Admitting: Family Medicine

## 2017-11-28 ENCOUNTER — Encounter: Payer: Self-pay | Admitting: *Deleted

## 2017-11-28 ENCOUNTER — Encounter: Payer: Self-pay | Admitting: Family Medicine

## 2017-11-28 VITALS — BP 121/66 | HR 83 | Temp 98.1°F | Resp 16 | Ht 64.0 in | Wt 141.5 lb

## 2017-11-28 DIAGNOSIS — R7303 Prediabetes: Secondary | ICD-10-CM

## 2017-11-28 DIAGNOSIS — Z79899 Other long term (current) drug therapy: Secondary | ICD-10-CM

## 2017-11-28 DIAGNOSIS — I10 Essential (primary) hypertension: Secondary | ICD-10-CM

## 2017-11-28 DIAGNOSIS — I739 Peripheral vascular disease, unspecified: Secondary | ICD-10-CM

## 2017-11-28 DIAGNOSIS — F411 Generalized anxiety disorder: Secondary | ICD-10-CM

## 2017-11-28 DIAGNOSIS — E7800 Pure hypercholesterolemia, unspecified: Secondary | ICD-10-CM | POA: Diagnosis not present

## 2017-11-28 LAB — LDL CHOLESTEROL, DIRECT: Direct LDL: 74 mg/dL

## 2017-11-28 LAB — LIPID PANEL
CHOL/HDL RATIO: 3
CHOLESTEROL: 153 mg/dL (ref 0–200)
HDL: 52.8 mg/dL (ref >39.00)
NONHDL: 99.76
TRIGLYCERIDES: 222 mg/dL — AB (ref 0.0–149.0)
VLDL: 44.4 mg/dL — ABNORMAL HIGH (ref 0.0–40.0)

## 2017-11-28 LAB — BASIC METABOLIC PANEL
BUN: 14 mg/dL (ref 6–23)
CHLORIDE: 105 meq/L (ref 96–112)
CO2: 29 mEq/L (ref 19–32)
CREATININE: 0.68 mg/dL (ref 0.40–1.20)
Calcium: 10.3 mg/dL (ref 8.4–10.5)
GFR: 91.85 mL/min (ref >60.00)
GLUCOSE: 89 mg/dL (ref 70–99)
POTASSIUM: 5 meq/L (ref 3.5–5.1)
Sodium: 140 mEq/L (ref 135–145)

## 2017-11-28 LAB — HEMOGLOBIN A1C: Hgb A1c MFr Bld: 6.2 % (ref 4.6–6.5)

## 2017-11-28 MED ORDER — ZOSTER VAC RECOMB ADJUVANTED 50 MCG/0.5ML IM SUSR: 0.5000 mL | Freq: Once | INTRAMUSCULAR | 1 refills | Status: AC

## 2017-11-28 NOTE — Progress Notes (Signed)
OFFICE VISIT  11/28/2017   CC:  Chief Complaint  Patient presents with  . Follow-up    RCI, pt is fasting.   HPI:    Patient is a 67 y.o. Caucasian female who presents for HTN, HLD, IFG. She uses diazepam to control anxiety.  This helps well taken at end of day for anxiety and insomnia.  Takes 2 of the 2mg  tabs qhs.  Discussed CSC today, most recent dose of diaz was last night. [place] online controlled substance database reviewed today: no suspicious activity. Most recent fill of diazepam was 11/04/17.  She should still have 2 RF's left at this time.  HTN: no recent bp monitoring b/c it has been so well controlled.  HLD: taking lipitor daily w/out side effects.  IFG: regularly exercises on treadmill, active at work. Trying to avoid simple sugars, excessive fats, eat more fruits/veggies, limit portion size.  ROS: no depression, no CP, no dizziness, no focal weakness, no LE swelling, no SOB.  Past Medical History:  Diagnosis Date  . Abdominal bruit 04/2017   Aortic u/s showed NO ANEURISM.  Marland Kitchen Anxiety   . Carotid artery occlusion   . Colon cancer screening 05/2017   Cologuard NEG--repeat 3 yrs.  . Essential hypertension, benign 05/22/2014  . Melanoma ([place])   . Mitral valve prolapse   . Mixed hyperlipidemia 05/22/2014  . Osteopenia 06/06/2016   DEXA T-score -1.5.  Repeat DEXA 2 yrs.  . Peripheral vascular disease, unspecified ([place]) 05/22/2014   innominate and carotid.  U/S f/u 12/2016 showed no signif change compared to 2017--vascular recommended repeat 1 yr.  . Prediabetes 2018   A1c 6.3%  . Reflux esophagitis   . Stroke [facility]) 2006   Mini  Stroke- not major  . Tobacco abuse, in remission    quit 2009    Past Surgical History:  Procedure Laterality Date  . ABDOMINAL HYSTERECTOMY  1997  . CARDIAC CATHETERIZATION  01/2012   NORMAL  . CAROTID ENDARTERECTOMY  08/21/2007   right - Dr. Amedeo Plenty  . CHOLECYSTECTOMY  2000  . DEXA  06/06/2016   T-score -1.5.  Repeat 2 yrs.  . Incision  and drainage of left thenar space abscess  02/23/15   s/p cat bite (Dr. Amedeo Plenty)  . innominate stent  2007   12/2014 u/s showed patent stent in innominate and L common carotid.  L prox int carotid 40-59% stenosis  . Left common carotid stent  2006   "                    "                  "                          "                             "  . TRANSTHORACIC ECHOCARDIOGRAM  2007   NORMAL    Outpatient Medications Prior to Visit  Medication Sig Dispense Refill  . aspirin EC 81 MG tablet Take 1 tablet (81 mg total) by mouth daily. 90 tablet 3  . atorvastatin (LIPITOR) 80 MG tablet TAKE 1 TABLET BY MOUTH EVERY DAY 30 tablet 5  . cholecalciferol (VITAMIN D) 1000 UNITS tablet Take 1,000 Units by mouth daily.    . clopidogrel (PLAVIX) 75 MG tablet Take 1 tablet (75  mg total) by mouth daily. 30 tablet 5  . diazepam (VALIUM) 2 MG tablet Take 1 TABLET BY MOUTH TWICE DAILY AS NEEDED 60 tablet 5  . fexofenadine (ALLEGRA) 180 MG tablet Take 180 mg by mouth daily as needed for allergies or rhinitis.    Marland Kitchen lisinopril (PRINIVIL,ZESTRIL) 5 MG tablet TAKE 1 TABLET BY MOUTH EVERY DAY 30 tablet 11  . Omega-3 Fatty Acids (FISH OIL) 1000 MG CAPS Take 2,000 mg by mouth daily.     . pantoprazole (PROTONIX) 40 MG tablet TAKE 1 TABLET BY MOUTH TWICE DAILY 60 tablet 5   No facility-administered medications prior to visit.     Allergies  Allergen Reactions  . Ciprocin-Fluocin-Procin [Fluocinolone] Nausea And Vomiting  . Demerol [Meperidine] Nausea And Vomiting  . Fentanyl Nausea And Vomiting    Pt states all pain medications cause N/V for her  . Penicillins Hives    ROS As per HPI  PE: Blood pressure 121/66, pulse 83, temperature 98.1 F (36.7 C), temperature source Oral, resp. rate 16, height 5\' 4"  (1.626 m), weight 141 lb 8 oz (64.2 kg), SpO2 98 %. Gen: Alert, well appearing.  Patient is oriented to person, place, time, and situation. AFFECT: pleasant, lucid thought and speech. CV: RRR, no m/r/g.    LUNGS: CTA bilat, nonlabored resps, good aeration in all lung fields. EXT: no clubbing, cyanosis, or edema.  L radial pulse 1+, R radial pulse 2+.   LABS:  Lab Results  Component Value Date   TSH 1.37 05/24/2017   Lab Results  Component Value Date   WBC 5.8 05/24/2017   HGB 13.7 05/24/2017   HCT 42.4 05/24/2017   MCV 88.4 05/24/2017   PLT 312.0 05/24/2017   Lab Results  Component Value Date   CREATININE 0.71 05/24/2017   BUN 18 05/24/2017   NA 140 05/24/2017   K 5.3 (H) 05/24/2017   CL 105 05/24/2017   CO2 30 05/24/2017   Lab Results  Component Value Date   ALT 22 05/24/2017   AST 22 05/24/2017   ALKPHOS 96 05/24/2017   BILITOT 1.3 (H) 05/24/2017   Lab Results  Component Value Date   CHOL 163 05/24/2017   Lab Results  Component Value Date   HDL 50.10 05/24/2017   Lab Results  Component Value Date   LDLCALC 78 05/24/2017   Lab Results  Component Value Date   TRIG 175.0 (H) 05/24/2017   Lab Results  Component Value Date   CHOLHDL 3 05/24/2017   Lab Results  Component Value Date   HGBA1C 6.3 05/24/2017    IMPRESSION AND PLAN:  1) HTN: The current medical regimen is effective;  continue present plan and medications. Lytes/cr today.  2) HLD: tolerating statin.  LDL 78 August 2018, and AST/ALT were normal at that time.  Repeat FLP today.  3) IFG: working consistently on TLC. Recheck fasting glucose and HbA1c today.  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 Appropriate use of diazepam, which helps well. CSC reviewed with pt, signed, and in chart today. UDS today.  Should see diazepam and nothing else.  5) PAD (carotids and innominant stents).  Continue ASA, Plavix, statin, BP control. Next u/s to follow this is due 12/2017--she is followed by vasc surgery.  An After Visit Summary was printed and given to the patient.  FOLLOW UP: Return in about 6 months (around 05/31/2018) for annual CPE (fasting).  Signed:  Crissie Sickles, MD  11/28/2017     

## 2017-12-03 LAB — PAIN MGMT, PROFILE 8 W/CONF, U
6 ACETYLMORPHINE: NEGATIVE ng/mL (ref <10)
ALCOHOL METABOLITES: NEGATIVE ng/mL (ref <500)
ALPHAHYDROXYMIDAZOLAM: NEGATIVE ng/mL (ref <50)
ALPHAHYDROXYTRIAZOLAM: NEGATIVE ng/mL (ref <50)
Alphahydroxyalprazolam: NEGATIVE ng/mL (ref <25)
Aminoclonazepam: NEGATIVE ng/mL (ref <25)
Amphetamines: NEGATIVE ng/mL (ref <500)
Benzodiazepines: POSITIVE ng/mL — AB (ref <100)
Buprenorphine, Urine: NEGATIVE ng/mL (ref <5)
COCAINE METABOLITE: NEGATIVE ng/mL (ref <150)
Creatinine: 110.6 mg/dL
HYDROXYETHYLFLURAZEPAM: NEGATIVE ng/mL (ref <50)
Lorazepam: NEGATIVE ng/mL (ref <50)
MARIJUANA METABOLITE: NEGATIVE ng/mL (ref <20)
MDMA: NEGATIVE ng/mL (ref <500)
Nordiazepam: 465 ng/mL — ABNORMAL HIGH (ref <50)
OPIATES: NEGATIVE ng/mL (ref <100)
OXYCODONE: NEGATIVE ng/mL (ref <100)
Oxidant: NEGATIVE ug/mL (ref <200)
Temazepam: 891 ng/mL — ABNORMAL HIGH (ref <50)
pH: 7.08 (ref 4.5–9.0)

## 2017-12-12 ENCOUNTER — Other Ambulatory Visit: Payer: Self-pay | Admitting: Family Medicine

## 2017-12-18 DIAGNOSIS — Z961 Presence of intraocular lens: Secondary | ICD-10-CM | POA: Diagnosis not present

## 2018-01-22 ENCOUNTER — Ambulatory Visit (HOSPITAL_COMMUNITY)
Admission: RE | Admit: 2018-01-22 | Discharge: 2018-01-22 | Disposition: A | Discharge status: Home / Self Care | Payer: PPO | Source: Ambulatory Visit | Attending: Family | Admitting: Family

## 2018-01-22 ENCOUNTER — Encounter: Payer: Self-pay | Admitting: Family Medicine

## 2018-01-22 ENCOUNTER — Ambulatory Visit (INDEPENDENT_AMBULATORY_CARE_PROVIDER_SITE_OTHER): Payer: PPO | Admitting: Family

## 2018-01-22 ENCOUNTER — Encounter: Payer: Self-pay | Admitting: Family

## 2018-01-22 ENCOUNTER — Ambulatory Visit: Payer: Self-pay | Admitting: *Deleted

## 2018-01-22 VITALS — BP 126/80 | HR 76 | Temp 97.1°F | Resp 16 | Ht 64.0 in | Wt 141.3 lb

## 2018-01-22 DIAGNOSIS — Z959 Presence of cardiac and vascular implant and graft, unspecified: Secondary | ICD-10-CM

## 2018-01-22 DIAGNOSIS — Z87891 Personal history of nicotine dependence: Secondary | ICD-10-CM | POA: Diagnosis not present

## 2018-01-22 DIAGNOSIS — Z9889 Other specified postprocedural states: Secondary | ICD-10-CM | POA: Diagnosis not present

## 2018-01-22 DIAGNOSIS — I6523 Occlusion and stenosis of bilateral carotid arteries: Secondary | ICD-10-CM | POA: Diagnosis not present

## 2018-01-22 NOTE — Telephone Encounter (Signed)
[name] phoned concerned about a brown area under her left breast. She stated she noticed the area felt sore with her bra on, Saturday. After taking off the bra, she saw a brown line under the breast about where the under wire would touch. Describes it today as brown, raised and no longer feels sore. She is requesting an appointment. I have asked her to send a picture via MyChart to have someone take a look at it.Advised to watch the area, however she wanted an appointment.  Appointment made for Wednesday at 8:45am as patient requested.

## 2018-01-22 NOTE — Patient Instructions (Signed)

## 2018-01-22 NOTE — Progress Notes (Signed)
Chief Complaint: Follow up Extracranial Carotid Artery Stenosis   History of Present Illness  [name] is a 67 y.o. female who is s/p right carotid endarterectomy on 08/21/2007. She has had an innominate artery stent placed on 06/23/2006, and a left common carotid artery stent on 03/31/2005; all of these procedures were performed by Dr. Amedeo Plenty. Dr. Trula Slade has been monitoring pt since Dr. Amedeo Plenty departure.   Patient has a history of a TIA in 2006 but denies any symptoms, states she had no sx's other than dizziness, was told that she had TIA's by results of CT.   She denies any history of amaurosis fugax or monocular blindness, hemiplegia, or receptive or expressive aphasia.   She denies claudication symptoms in her legs with walking.  She walks 2 miles daily, is diligent about eating healthy.  She reports mowing her lawn on 01-03-17 when she had an episode of dyspnea and "just felt bad". She wears an Apple watch that also transmits her heart rate to her phone, and her heart rate at that time was was 201 which lasted for about a minute. After a minute of this she started feeling better.   Pt Diabetic: No Pt smoker: former smoker, quit in 2006  Pt meds include: Statin : Yes ASA: Yes Other anticoagulants/antiplatelets: Plavix    Past Medical History:  Diagnosis Date  . Abdominal bruit 04/2017   Aortic u/s showed NO ANEURISM.  Marland Kitchen Anxiety   . Carotid artery occlusion   . Colon cancer screening 05/2017   Cologuard NEG--repeat 3 yrs.  . Essential hypertension, benign 05/22/2014  . Melanoma ([place])   . Mitral valve prolapse   . Mixed hyperlipidemia 05/22/2014  . Osteopenia 06/06/2016   DEXA T-score -1.5.  Repeat DEXA 2 yrs.  . Peripheral vascular disease, unspecified ([place]) 05/22/2014   innominate and carotid.  U/S f/u 12/2016 showed no signif change compared to 2017--vascular recommended repeat 1 yr.  . Prediabetes 2018   A1c 6.3%  . Reflux esophagitis   . Stroke  [facility]) 2006   Mini  Stroke- not major  . Tobacco abuse, in remission    quit 2009    Social History Social History   Tobacco Use  . Smoking status: Former Smoker    Types: Cigarettes    Last attempt to quit: 08/06/2002    Years since quitting: 15.4  . Smokeless tobacco: Never Used  Substance Use Topics  . Alcohol use: No  . Drug use: No    Family History Family History  Problem Relation Age of Onset  . Heart attack Mother   . CAD Mother   . Heart disease Mother        Before age 29  . Hypertension Mother   . Hyperlipidemia Mother   . Hypertension Father   . Pneumonia Father   . Hyperlipidemia Father   . Heart murmur Sister   . Osteoporosis Paternal Grandmother   . CAD Paternal Grandfather   . Breast cancer Neg Hx     Surgical History Past Surgical History:  Procedure Laterality Date  . ABDOMINAL HYSTERECTOMY  1997  . CARDIAC CATHETERIZATION  01/2012   NORMAL  . CAROTID ENDARTERECTOMY  08/21/2007   right - Dr. Amedeo Plenty  . CHOLECYSTECTOMY  2000  . DEXA  06/06/2016   T-score -1.5.  Repeat 2 yrs.  . Incision and drainage of left thenar space abscess  02/23/15   s/p cat bite (Dr. Amedeo Plenty)  . innominate stent  2007   12/2014  u/s showed patent stent in innominate and L common carotid.  L prox int carotid 40-59% stenosis  . Left common carotid stent  2006   "                    "                  "                          "                             "  . TRANSTHORACIC ECHOCARDIOGRAM  2007   NORMAL    Allergies  Allergen Reactions  . Ciprocin-Fluocin-Procin [Fluocinolone] Nausea And Vomiting  . Demerol [Meperidine] Nausea And Vomiting  . Fentanyl Nausea And Vomiting    Pt states all pain medications cause N/V for her  . Penicillins Hives    Current Outpatient Medications  Medication Sig Dispense Refill  . aspirin EC 81 MG tablet Take 1 tablet (81 mg total) by mouth daily. 90 tablet 3  . atorvastatin (LIPITOR) 80 MG tablet TAKE 1 TABLET BY MOUTH EVERY DAY 30  tablet 5  . cholecalciferol (VITAMIN D) 1000 UNITS tablet Take 1,000 Units by mouth daily.    . clopidogrel (PLAVIX) 75 MG tablet Take 1 tablet (75 mg total) by mouth daily. 30 tablet 5  . diazepam (VALIUM) 2 MG tablet Take 1 TABLET BY MOUTH TWICE DAILY AS NEEDED 60 tablet 5  . fexofenadine (ALLEGRA) 180 MG tablet Take 180 mg by mouth daily as needed for allergies or rhinitis.    Marland Kitchen lisinopril (PRINIVIL,ZESTRIL) 5 MG tablet TAKE 1 TABLET BY MOUTH EVERY DAY 30 tablet 11  . Omega-3 Fatty Acids (FISH OIL) 1000 MG CAPS Take 2,000 mg by mouth daily.     . pantoprazole (PROTONIX) 40 MG tablet TAKE 1 TABLET BY MOUTH TWICE DAILY 60 tablet 5   No current facility-administered medications for this visit.     Review of Systems : See HPI for pertinent positives and negatives.  Physical Examination  Vitals:   01/22/18 1150 01/22/18 1152  BP: 129/81 126/80  Pulse: 76   Resp: 16   Temp: (!) 97.1 F (36.2 C)   TempSrc: Oral   SpO2: 98%   Weight: 141 lb 4.8 oz (64.1 kg)   Height: 5\' 4"  (1.626 m)    Body mass index is 24.25 kg/m.  General: WDWN female in NAD HENT: no gross abnormalities  GAIT:normal Eyes: PERRLA, right eyelid ptosis Pulmonary:Respirations are non labored, CTAB Cardiac: regular rhythm and rate, no detected murmur   VASCULAR EXAM Carotid Bruits Right Left   Positive, soft Positive, moderate     Abdominal aortic pulse is not palpable. Radial pulses are 2+ palpable and equal.    Brachial pulses: 3+ bilaterally  LE Pulses Right Left       POPLITEAL  not palpable   not palpable       POSTERIOR TIBIAL  2+ palpable   3+ palpable        DORSALIS PEDIS      ANTERIOR TIBIAL not palpable  not palpable      Gastrointestinal: soft, nontender,  BS WNL, no r/g, no palpable masses. Musculoskeletal: No muscle atrophy/wasting. M/S 5/5 throughout, Extremities without ischemic changes. Skin: No rashes, no ulcers, no cellulitis.   Neurologic:  A&O X 3; appropriate affect, sensation is normal; speech is normal, CN 2-12 intact, pain and light touch intact in extremities, motor exam as listed above. Psychiatric: Normal thought content, mood appropriate to clinical situation.    Assessment: SONJA MANSEAU is a 67 y.o. female who is s/p right carotid endarterectomy on 08/21/07; Innominate artery stent on 06/23/06; Left common carotid artery stent on 03/31/05. Patient has a history of a TIA in 2006 but denies any symptoms. She has had no subsequent TIA or stroke.   She walks 2 miles daily, is diligent about eating healthy.    DATA Carotid Duplex (01/22/18): Right ICA: CEA site, with no restenosis Left ICA: 40-59% stenosis Could not positively identify the left CCA stent Innominate PSV outflow at 314 cm/s (compared to 240 cm/s on 01-09-17) Bilateral vertebral artery flow is antegrade.  Bilateral subclavian artery waveforms are normal.  Slight increased stenosis in innominate outflow compared to the exam on 01-09-17.      CT angiogram of neck and chest on 08/06/12 requested by Dr. Trula Slade: 1. Patent short segment stents at the brachial cephalic artery and  left common carotid artery origins.  2. Interval right carotid endarterectomy. Suggestion of ulcerated  plaque in the right CCA at the level of the vocal cords. No  cervical right carotid stenosis.  3. Mild soft plaque in the left CCA. Bulky plaque at the left ICA  origin and proximal bulb with short segment stenosis of up to 60-  65%.  4. Right subclavian artery origin stenosis appears related to soft  plaque and measures up to 65%.  5. Normal vertebral arteries.  1. Wide patency of both the right innominate and left common  carotid artery stents without  hemodynamically significant stenosis.  2. Normal caliber of the thoracic aorta without evidence of  dissection.  3. Extensive centrilobular emphysema.     Plan: Follow-up in 1 year with Carotid Duplex scan.    I discussed in depth with the patient the nature of atherosclerosis, and emphasized the importance of maximal medical management including strict control of blood pressure, blood glucose, and lipid levels, obtaining regular exercise, and continued cessation of smoking.  The patient is aware that without maximal medical management the underlying atherosclerotic disease process will progress, limiting the benefit of any interventions. The patient was given information about stroke prevention and what symptoms should prompt the patient to seek immediate medical care. Thank you for allowing [place] to participate in this patient's care.  Clemon Chambers, RN, MSN, FNP-C [facility] Office: [phone]  Clinic Physician: Trula Slade  01/22/18 12:18 PM

## 2018-01-22 NOTE — Progress Notes (Signed)
Pt. Is here for 1 year follow-up.

## 2018-01-23 NOTE — Telephone Encounter (Signed)
Noted  

## 2018-01-24 ENCOUNTER — Ambulatory Visit (INDEPENDENT_AMBULATORY_CARE_PROVIDER_SITE_OTHER): Payer: PPO | Admitting: Family Medicine

## 2018-01-24 ENCOUNTER — Encounter: Payer: Self-pay | Admitting: Family Medicine

## 2018-01-24 VITALS — BP 131/77 | HR 83 | Resp 16 | Ht 64.0 in | Wt 145.0 lb

## 2018-01-24 DIAGNOSIS — L988 Other specified disorders of the skin and subcutaneous tissue: Secondary | ICD-10-CM

## 2018-01-24 DIAGNOSIS — N649 Disorder of breast, unspecified: Secondary | ICD-10-CM

## 2018-01-24 NOTE — Progress Notes (Signed)
OFFICE VISIT  01/24/2018   CC:  Chief Complaint  Patient presents with  . skin lesion    under left breast   HPI:    Patient is a 67 y.o. Caucasian female who presents for left breast complaint. Noted an area under L breast 5 d/a, it was hurting some--she wears underwire bras.   [facility] clinic saw this area 2 d/a and was told to come here for evaluation.  ROS: no malaise, fatigue, fevers, abnl weight loss, nipple d/c, or breast nodule/mass felt by pt.  Past Medical History:  Diagnosis Date  . Abdominal bruit 04/2017   Aortic u/s showed NO ANEURISM.  Marland Kitchen Anxiety   . Carotid artery occlusion   . Colon cancer screening 05/2017   Cologuard NEG--repeat 3 yrs.  . Essential hypertension, benign 05/22/2014  . Melanoma ([place])   . Mitral valve prolapse   . Mixed hyperlipidemia 05/22/2014  . Osteopenia 06/06/2016   DEXA T-score -1.5.  Repeat DEXA 2 yrs.  . Peripheral vascular disease, unspecified ([place]) 05/22/2014   innominate and carotid.  U/S f/u 12/2016 showed no signif change compared to 2017--vascular recommended repeat 1 yr.  . Prediabetes 2018   A1c 6.3%  . Reflux esophagitis   . Stroke [facility]) 2006   Mini  Stroke- not major  . Tobacco abuse, in remission    quit 2009    Past Surgical History:  Procedure Laterality Date  . ABDOMINAL HYSTERECTOMY  1997  . CARDIAC CATHETERIZATION  01/2012   NORMAL  . CAROTID ENDARTERECTOMY  08/21/2007   right - Dr. Amedeo Plenty  . CHOLECYSTECTOMY  2000  . DEXA  06/06/2016   T-score -1.5.  Repeat 2 yrs.  . Incision and drainage of left thenar space abscess  02/23/15   s/p cat bite (Dr. Amedeo Plenty)  . innominate stent  2007   12/2014 u/s showed patent stent in innominate and L common carotid.  L prox int carotid 40-59% stenosis  . Left common carotid stent  2006   "                    "                  "                          "                             "  . TRANSTHORACIC ECHOCARDIOGRAM  2007   NORMAL    Outpatient Medications Prior to  Visit  Medication Sig Dispense Refill  . aspirin EC 81 MG tablet Take 1 tablet (81 mg total) by mouth daily. 90 tablet 3  . atorvastatin (LIPITOR) 80 MG tablet TAKE 1 TABLET BY MOUTH EVERY DAY 30 tablet 5  . cholecalciferol (VITAMIN D) 1000 UNITS tablet Take 1,000 Units by mouth daily.    . clopidogrel (PLAVIX) 75 MG tablet Take 1 tablet (75 mg total) by mouth daily. 30 tablet 5  . diazepam (VALIUM) 2 MG tablet Take 1 TABLET BY MOUTH TWICE DAILY AS NEEDED 60 tablet 5  . fexofenadine (ALLEGRA) 180 MG tablet Take 180 mg by mouth daily as needed for allergies or rhinitis.    Marland Kitchen lisinopril (PRINIVIL,ZESTRIL) 5 MG tablet TAKE 1 TABLET BY MOUTH EVERY DAY 30 tablet 11  . Omega-3 Fatty Acids (FISH OIL) 1000 MG CAPS  Take 2,000 mg by mouth daily.     . pantoprazole (PROTONIX) 40 MG tablet TAKE 1 TABLET BY MOUTH TWICE DAILY 60 tablet 5   No facility-administered medications prior to visit.     Allergies  Allergen Reactions  . Ciprocin-Fluocin-Procin [Fluocinolone] Nausea And Vomiting  . Demerol [Meperidine] Nausea And Vomiting  . Fentanyl Nausea And Vomiting    Pt states all pain medications cause N/V for her  . Penicillins Hives    ROS As per HPI  PE: Blood pressure 131/77, pulse 83, resp. rate 16, height 5\' 4"  (1.626 m), weight 145 lb (65.8 kg), SpO2 99 %.  Exam chaperoned by Starla Link, CMA.  Gen: Alert, well appearing.  Patient is oriented to person, place, time, and situation. AFFECT: pleasant, lucid thought and speech. Breasts are symmetric.  No dominant, discrete, fixed  or suspicious masses are noted.  No skin or nipple changes or axillary nodes. In inframammary crease under left breast she has a linear, brown pigmented lesion about 2 cm long and 1 mm wide, and it is minimally raised from the surface of surrounding skin.  No tenderness, no surrounding erythema, no subQ nodularity.  No rash.   LABS:   Lab Results  Component Value Date   HGBA1C 6.2 11/28/2017      Chemistry       Component Value Date/Time   NA 140 11/28/2017 0828   K 5.0 11/28/2017 0828   CL 105 11/28/2017 0828   CO2 29 11/28/2017 0828   BUN 14 11/28/2017 0828   CREATININE 0.68 11/28/2017 0828      Component Value Date/Time   CALCIUM 10.3 11/28/2017 0828   ALKPHOS 96 05/24/2017 0837   AST 22 05/24/2017 0837   ALT 22 05/24/2017 0837   BILITOT 1.3 (H) 05/24/2017 0837     06/19/17: screening mammogram NEG  IMPRESSION AND PLAN:  1) Skin lesion under left breast, atypical appearing. Refer to derm for further expert evaluation. Instructions: If you cannot get in for a dermatology appointment in 2 weeks or less, make a 30 min appt with me and I will do a biopsy of the skin lesion.  An After Visit Summary was printed and given to the patient.  FOLLOW UP: Return for as needed.  Signed:  Crissie Sickles, MD           01/24/2018

## 2018-01-24 NOTE — Patient Instructions (Signed)
If you cannot get in for a dermatology appointment in 2 weeks or less, make a 30 min appt with me and I will do a biopsy of the skin lesion.

## 2018-01-25 ENCOUNTER — Encounter (INDEPENDENT_AMBULATORY_CARE_PROVIDER_SITE_OTHER): Payer: Self-pay

## 2018-02-09 ENCOUNTER — Other Ambulatory Visit: Payer: Self-pay

## 2018-02-09 DIAGNOSIS — L820 Inflamed seborrheic keratosis: Secondary | ICD-10-CM | POA: Diagnosis not present

## 2018-02-09 DIAGNOSIS — D229 Melanocytic nevi, unspecified: Secondary | ICD-10-CM | POA: Diagnosis not present

## 2018-02-09 DIAGNOSIS — D485 Neoplasm of uncertain behavior of skin: Secondary | ICD-10-CM | POA: Diagnosis not present

## 2018-02-09 DIAGNOSIS — C44612 Basal cell carcinoma of skin of right upper limb, including shoulder: Secondary | ICD-10-CM | POA: Diagnosis not present

## 2018-03-09 ENCOUNTER — Other Ambulatory Visit: Payer: Self-pay | Admitting: Family Medicine

## 2018-03-09 NOTE — Telephone Encounter (Signed)
eRx'd diazepam.

## 2018-03-09 NOTE — Telephone Encounter (Signed)
RF request for diazepam LOV: 11/28/17 Next ov: 05/31/18 Last written: 07/27/17 #60 w/ 5RF  Please advise. Thanks.

## 2018-03-15 ENCOUNTER — Encounter: Payer: Self-pay | Admitting: Family Medicine

## 2018-03-15 DIAGNOSIS — C44612 Basal cell carcinoma of skin of right upper limb, including shoulder: Secondary | ICD-10-CM | POA: Diagnosis not present

## 2018-05-05 ENCOUNTER — Other Ambulatory Visit: Payer: Self-pay | Admitting: Family Medicine

## 2018-05-23 ENCOUNTER — Ambulatory Visit: Payer: Self-pay | Admitting: *Deleted

## 2018-05-23 ENCOUNTER — Encounter: Payer: Self-pay | Admitting: Family Medicine

## 2018-05-23 ENCOUNTER — Ambulatory Visit (INDEPENDENT_AMBULATORY_CARE_PROVIDER_SITE_OTHER): Payer: PPO | Admitting: Family Medicine

## 2018-05-23 VITALS — BP 109/70 | HR 77 | Temp 98.4°F | Resp 16 | Ht 64.0 in | Wt 142.1 lb

## 2018-05-23 DIAGNOSIS — M79661 Pain in right lower leg: Secondary | ICD-10-CM

## 2018-05-23 DIAGNOSIS — I8393 Asymptomatic varicose veins of bilateral lower extremities: Secondary | ICD-10-CM

## 2018-05-23 DIAGNOSIS — M79662 Pain in left lower leg: Secondary | ICD-10-CM

## 2018-05-23 DIAGNOSIS — Z23 Encounter for immunization: Secondary | ICD-10-CM

## 2018-05-23 NOTE — Patient Instructions (Signed)
Apply ice to the areas on your calf that have turned blue and hurt. Take [phone] mg of tylenol every 8 hours as needed for pain. Elevate legs above the level of your heart for 20 min daily.

## 2018-05-23 NOTE — Progress Notes (Signed)
OFFICE VISIT  05/23/2018   CC:  Chief Complaint  Patient presents with  . Leg Pain    lower, bilateral   HPI:   Patient is a 67 y.o. Caucasian female on ASA and Plavix for significant PVD (hx of R carotid endarterectomy, innominate artery stent and L common carotid stent) who presents for bilat calf pain and bruising.  Legs aching a lot lately, primarily calves: hard to tell the onset but seems to be at least a month. Has been working long hours standing up, push mows yard, walks on treadmill a lot. Leg aching has not been progressing, but she noted yesterday that she had a bruise on back of each calf. No trauma to calves.  Denies swelling in feet. Bottoms of feet ache after standing a lot.  No pain at first steps out of bed.  ROS: no CP, no SOB, no wheezing, no cough, no dizziness, no HAs, no rashes, no melena/hematochezia.  No polyuria or polydipsia.  No myalgias or arthralgias.   Past Medical History:  Diagnosis Date  . Abdominal bruit 04/2017   Aortic u/s showed NO ANEURISM.  Marland Kitchen Anxiety   . BCC (basal cell carcinoma), arm, right   . Carotid artery occlusion   . Colon cancer screening 05/2017   Cologuard NEG--repeat 3 yrs.  . Essential hypertension, benign 05/22/2014  . Melanoma ([place])   . Mitral valve prolapse   . Mixed hyperlipidemia 05/22/2014  . Osteopenia 06/06/2016   DEXA T-score -1.5.  Repeat DEXA 2 yrs.  . Peripheral vascular disease, unspecified ([place]) 05/22/2014   innominate and carotid.  U/S f/u 12/2016 showed no signif change compared to 2017--vascular recommended repeat 1 yr.  . Prediabetes 2018   A1c 6.3%  . Reflux esophagitis   . Stroke [facility]) 2006   Mini  Stroke- not major  . Tobacco abuse, in remission    quit 2009    Past Surgical History:  Procedure Laterality Date  . ABDOMINAL HYSTERECTOMY  1997  . CARDIAC CATHETERIZATION  01/2012   NORMAL  . CAROTID ENDARTERECTOMY  08/21/2007   right - Dr. Amedeo Plenty  . CHOLECYSTECTOMY  2000  . DEXA  06/06/2016   T-score -1.5.  Repeat 2 yrs.  . Incision and drainage of left thenar space abscess  02/23/15   s/p cat bite (Dr. Amedeo Plenty)  . innominate stent  2007   12/2014 u/s showed patent stent in innominate and L common carotid.  L prox int carotid 40-59% stenosis  . Left common carotid stent  2006   "                    "                  "                          "                             "  . TRANSTHORACIC ECHOCARDIOGRAM  2007   NORMAL    Outpatient Medications Prior to Visit  Medication Sig Dispense Refill  . aspirin EC 81 MG tablet Take 1 tablet (81 mg total) by mouth daily. 90 tablet 3  . atorvastatin (LIPITOR) 80 MG tablet TAKE 1 TABLET BY MOUTH EVERY DAY 30 tablet 5  . cholecalciferol (VITAMIN D) 1000 UNITS tablet Take 1,000 Units by mouth daily.    Marland Kitchen  clopidogrel (PLAVIX) 75 MG tablet TAKE ONE TABLET BY MOUTH DAILY 30 tablet 3  . diazepam (VALIUM) 2 MG tablet TAKE ONE TABLET BY MOUTH TWICE DAILY 60 tablet 5  . fexofenadine (ALLEGRA) 180 MG tablet Take 180 mg by mouth daily as needed for allergies or rhinitis.    Marland Kitchen lisinopril (PRINIVIL,ZESTRIL) 5 MG tablet TAKE ONE TABLET BY MOUTH EVERY DAY 30 tablet 6  . Omega-3 Fatty Acids (FISH OIL) 1000 MG CAPS Take 2,000 mg by mouth daily.     . pantoprazole (PROTONIX) 40 MG tablet TAKE ONE TABLET BY MOUTH TWICE DAILY 60 tablet 3   No facility-administered medications prior to visit.     Allergies  Allergen Reactions  . Ciprocin-Fluocin-Procin [Fluocinolone] Nausea And Vomiting  . Demerol [Meperidine] Nausea And Vomiting  . Fentanyl Nausea And Vomiting    Pt states all pain medications cause N/V for her  . Penicillins Hives    ROS As per HPI  PE: Blood pressure 109/70, pulse 77, temperature 98.4 F (36.9 C), temperature source Oral, resp. rate 16, height 5\' 4"  (1.626 m), weight 142 lb 2 oz (64.5 kg), SpO2 97 %. Body mass index is 24.4 kg/m.  Gen: Alert, well appearing.  Patient is oriented to person, place, time, and situation. AFFECT:  pleasant, lucid thought and speech. Legs: no swelling or edema.  No erythema.  Lower legs symmetric.  She has a few areas of spider veins and a couple of larger non-inflamed varicose veins.  Back of each calf has a focal area of spider veins, mildly TTP over these areas.  No erythema/warmth/nodularity.  Homan's neg.  LABS:    Chemistry      Component Value Date/Time   NA 140 11/28/2017 0828   K 5.0 11/28/2017 0828   CL 105 11/28/2017 0828   CO2 29 11/28/2017 0828   BUN 14 11/28/2017 0828   CREATININE 0.68 11/28/2017 0828      Component Value Date/Time   CALCIUM 10.3 11/28/2017 0828   ALKPHOS 96 05/24/2017 0837   AST 22 05/24/2017 0837   ALT 22 05/24/2017 0837   BILITOT 1.3 (H) 05/24/2017 0837     Lab Results  Component Value Date   WBC 5.8 05/24/2017   HGB 13.7 05/24/2017   HCT 42.4 05/24/2017   MCV 88.4 05/24/2017   PLT 312.0 05/24/2017   Lab Results  Component Value Date   INR 0.9 08/17/2007   INR 0.9 08/03/2007    IMPRESSION AND PLAN:  1) Bilat LE venous insufficiency, with symptomatic varicose/spider veins. She has no sign of DVT. No sign of cellulitis or superficial thrombophlebitis. All I can detect is a localized region of mildly distended spider veins on each calf. No ecchymoses or petechiae.   Recommended ice to areas of spider vein distention/tenderness.   Elevate legs 20 min qd. Rest legs. Tylenol q6h prn discussed.    Signs/symptoms to call or return for were reviewed and pt expressed understanding.  An After Visit Summary was printed and given to the patient.  FOLLOW UP: Return for keep appt already scheduled for next month.  Signed:  Crissie Sickles, MD           05/23/2018

## 2018-05-23 NOTE — Telephone Encounter (Signed)
Noted  

## 2018-05-23 NOTE — Telephone Encounter (Signed)
Pt called with complaints of bruises on the calves of both legs for the past 3 days; she says that her calves are very painful; she has that she does not have a good vascular system; she also says that she has pain when she lays down; the pt further states that she has been on her feet a lot; recommendations made per nurse triage protocol to include seeing a physician within 24 hours; pt offered and accepted appointment with Dr Ricardo Jericho, [place], today at 1515; she verifies understanding; will route to office for notification of this upcoming appointment.  Reason for Disposition . Taking Coumadin (warfarin) or other strong blood thinner, or known bleeding disorder (e.g., thrombocytopenia)  Answer Assessment - Initial Assessment Questions 1. APPEARANCE of BRUISE: "Describe the bruise."      purple 2. SIZE: "How large is the bruise?"   4-5 inches wide on right leg; 1-2 inches wide of left leg 3. NUMBER: "How many bruises are there?"      2  4. LOCATION: "Where is the bruise located?"      Bilateral calves 5. ONSET: "How long ago did the bruise occur?"      3 days ago 6. CAUSE: "Tell me how it happened."     On her feet a lot 7. MEDICAL HISTORY: "Do you have any medical problems that can cause easy bruising or bleeding?" (e.g., leukemia, liver disease, recent chemotherapy)     Poor vascular system 8. MEDICATIONS : "Do you take any medications which thin the blood such as: aspirin, heparin, ibuprofen (NSAIDS), Plavix, or Coumadin?"     Aspirin and plavix 9. OTHER SYMPTOMS: "Do you have any other symptoms?"  (e.g., weakness, dizziness, pain, fever, nosebleed, blood in urine/stool)     Pain rated 4 out of 10; intermittent 2 second episodes of dizziness 10. PREGNANCY: "Is there any chance you are pregnant?" "When was your last menstrual period?"     no  Protocols used: BRUISES-A-AH

## 2018-05-30 ENCOUNTER — Other Ambulatory Visit: Payer: Self-pay | Admitting: Family Medicine

## 2018-05-31 ENCOUNTER — Encounter: Payer: Self-pay | Admitting: Family Medicine

## 2018-05-31 ENCOUNTER — Other Ambulatory Visit: Payer: Self-pay | Admitting: Family Medicine

## 2018-05-31 ENCOUNTER — Ambulatory Visit (INDEPENDENT_AMBULATORY_CARE_PROVIDER_SITE_OTHER): Payer: PPO | Admitting: Family Medicine

## 2018-05-31 VITALS — BP 114/71 | HR 65 | Temp 98.3°F | Resp 16 | Ht 64.0 in | Wt 141.5 lb

## 2018-05-31 DIAGNOSIS — R7303 Prediabetes: Secondary | ICD-10-CM | POA: Diagnosis not present

## 2018-05-31 DIAGNOSIS — M810 Age-related osteoporosis without current pathological fracture: Secondary | ICD-10-CM | POA: Diagnosis not present

## 2018-05-31 DIAGNOSIS — Z0000 Encounter for general adult medical examination without abnormal findings: Secondary | ICD-10-CM | POA: Diagnosis not present

## 2018-05-31 DIAGNOSIS — I10 Essential (primary) hypertension: Secondary | ICD-10-CM

## 2018-05-31 DIAGNOSIS — M8580 Other specified disorders of bone density and structure, unspecified site: Secondary | ICD-10-CM

## 2018-05-31 DIAGNOSIS — Z1231 Encounter for screening mammogram for malignant neoplasm of breast: Secondary | ICD-10-CM | POA: Diagnosis not present

## 2018-05-31 DIAGNOSIS — Z1239 Encounter for other screening for malignant neoplasm of breast: Secondary | ICD-10-CM

## 2018-05-31 DIAGNOSIS — E7800 Pure hypercholesterolemia, unspecified: Secondary | ICD-10-CM | POA: Diagnosis not present

## 2018-05-31 DIAGNOSIS — E2839 Other primary ovarian failure: Secondary | ICD-10-CM | POA: Diagnosis not present

## 2018-05-31 DIAGNOSIS — Z780 Asymptomatic menopausal state: Secondary | ICD-10-CM

## 2018-05-31 LAB — CBC WITH DIFFERENTIAL/PLATELET
BASOS PCT: 0.8 % (ref 0.0–3.0)
Basophils Absolute: 0 10*3/uL (ref 0.0–0.1)
EOS ABS: 0.3 10*3/uL (ref 0.0–0.7)
EOS PCT: 4.7 % (ref 0.0–5.0)
HEMATOCRIT: 43.1 % (ref 36.0–46.0)
Hemoglobin: 14.4 g/dL (ref 12.0–15.0)
LYMPHS PCT: 37.3 % (ref 12.0–46.0)
Lymphs Abs: 2.2 10*3/uL (ref 0.7–4.0)
MCHC: 33.3 g/dL (ref 30.0–36.0)
MCV: 92.5 fl (ref 78.0–100.0)
MONO ABS: 0.5 10*3/uL (ref 0.1–1.0)
Monocytes Relative: 8.3 % (ref 3.0–12.0)
Neutro Abs: 2.9 10*3/uL (ref 1.4–7.7)
Neutrophils Relative %: 48.9 % (ref 43.0–77.0)
Platelets: 293 10*3/uL (ref 150.0–400.0)
RBC: 4.67 Mil/uL (ref 3.87–5.11)
RDW: 13.9 % (ref 11.5–15.5)
WBC: 5.8 10*3/uL (ref 4.0–10.5)

## 2018-05-31 LAB — COMPREHENSIVE METABOLIC PANEL
ALT: 26 U/L (ref 0–35)
AST: 23 U/L (ref 0–37)
Albumin: 4.4 g/dL (ref 3.5–5.2)
Alkaline Phosphatase: 98 U/L (ref 39–117)
BILIRUBIN TOTAL: 1.5 mg/dL — AB (ref 0.2–1.2)
BUN: 13 mg/dL (ref 6–23)
CO2: 32 meq/L (ref 19–32)
CREATININE: 0.68 mg/dL (ref 0.40–1.20)
Calcium: 10.6 mg/dL — ABNORMAL HIGH (ref 8.4–10.5)
Chloride: 104 mEq/L (ref 96–112)
GFR: 91.71 mL/min (ref >60.00)
Glucose, Bld: 100 mg/dL — ABNORMAL HIGH (ref 70–99)
Potassium: 5.2 mEq/L — ABNORMAL HIGH (ref 3.5–5.1)
SODIUM: 140 meq/L (ref 135–145)
Total Protein: 7 g/dL (ref 6.0–8.3)

## 2018-05-31 LAB — LIPID PANEL
CHOLESTEROL: 141 mg/dL (ref 0–200)
HDL: 46.7 mg/dL (ref >39.00)
LDL Cholesterol: 65 mg/dL (ref 0–99)
NonHDL: 93.97
TRIGLYCERIDES: 146 mg/dL (ref 0.0–149.0)
Total CHOL/HDL Ratio: 3
VLDL: 29.2 mg/dL (ref 0.0–40.0)

## 2018-05-31 LAB — HEMOGLOBIN A1C: HEMOGLOBIN A1C: 6.1 % (ref 4.6–6.5)

## 2018-05-31 LAB — TSH: TSH: 0.92 u[IU]/mL (ref 0.35–4.50)

## 2018-05-31 MED ORDER — ZOSTER VAC RECOMB ADJUVANTED 50 MCG/0.5ML IM SUSR: 0.5000 mL | Freq: Once | INTRAMUSCULAR | 1 refills | Status: AC

## 2018-05-31 NOTE — Patient Instructions (Signed)

## 2018-05-31 NOTE — Progress Notes (Signed)
Office Note 05/31/2018  CC:  Chief Complaint  Patient presents with  . Annual Exam    Pt is fasting.    HPI:  [name] is a 67 y.o. White female who is here for annual health maintenance exam. She does not have an OB/GYN MD.  Exercise: walks on treadmill 40 min per day.  Active at work and mows yard/weed eats. Diet: I watch what I eat, protein shake.  Eats 1 meal a day, focuses on veggies, only occ meat. Eyes: exam UTD. Plans on going to dentist later this year.   Past Medical History:  Diagnosis Date  . Abdominal bruit 04/2017   Aortic u/s showed NO ANEURISM.  Marland Kitchen Anxiety   . BCC (basal cell carcinoma), arm, right   . Carotid artery occlusion   . Colon cancer screening 05/2017   Cologuard NEG--repeat 3 yrs.  . Essential hypertension, benign 05/22/2014  . Melanoma ([place])   . Mitral valve prolapse   . Mixed hyperlipidemia 05/22/2014  . Osteopenia 06/06/2016   DEXA T-score -1.5.  Repeat DEXA 2 yrs.  . Peripheral vascular disease, unspecified ([place]) 05/22/2014   innominate and carotid.  U/S f/u 12/2016 showed no signif change compared to 2017--vascular recommended repeat 1 yr.  . Prediabetes 2018   A1c 6.3%  . Reflux esophagitis   . Stroke [facility]) 2006   Mini  Stroke- not major  . Tobacco abuse, in remission    quit 2009    Past Surgical History:  Procedure Laterality Date  . ABDOMINAL HYSTERECTOMY  1997  . CARDIAC CATHETERIZATION  01/2012   NORMAL  . CAROTID ENDARTERECTOMY  08/21/2007   right - Dr. Amedeo Plenty  . CHOLECYSTECTOMY  2000  . DEXA  06/06/2016   T-score -1.5.  Repeat 2 yrs.  . Incision and drainage of left thenar space abscess  02/23/15   s/p cat bite (Dr. Amedeo Plenty)  . innominate stent  2007   12/2014 u/s showed patent stent in innominate and L common carotid.  L prox int carotid 40-59% stenosis  . Left common carotid stent  2006   "                    "                  "                          "                             "  . TRANSTHORACIC  ECHOCARDIOGRAM  2007   NORMAL    Family History  Problem Relation Age of Onset  . Heart attack Mother   . CAD Mother   . Heart disease Mother        Before age 27  . Hypertension Mother   . Hyperlipidemia Mother   . Hypertension Father   . Pneumonia Father   . Hyperlipidemia Father   . Heart murmur Sister   . Osteoporosis Paternal Grandmother   . CAD Paternal Grandfather   . Breast cancer Neg Hx     Social History   Socioeconomic History  . Marital status: Married    Spouse name: Not on file  . Number of children: Not on file  . Years of education: Not on file  . Highest education level: Not on file  Occupational History  .  Not on file  Social Needs  . Financial resource strain: Not on file  . Food insecurity:    Worry: Not on file    Inability: Not on file  . Transportation needs:    Medical: Not on file    Non-medical: Not on file  Tobacco Use  . Smoking status: Former Smoker    Types: Cigarettes    Last attempt to quit: 08/06/2002    Years since quitting: 15.8  . Smokeless tobacco: Never Used  Substance and Sexual Activity  . Alcohol use: No  . Drug use: No  . Sexual activity: Not on file  Lifestyle  . Physical activity:    Days per week: Not on file    Minutes per session: Not on file  . Stress: Not on file  Relationships  . Social connections:    Talks on phone: Not on file    Gets together: Not on file    Attends religious service: Not on file    Active member of club or organization: Not on file    Attends meetings of clubs or organizations: Not on file    Relationship status: Not on file  . Intimate partner violence:    Fear of current or ex partner: Not on file    Emotionally abused: Not on file    Physically abused: Not on file    Forced sexual activity: Not on file  Other Topics Concern  . Not on file  Social History Narrative   Widowed as of December 26, 2014 (husband died of viral illness while out of town in [place], [place]), has one daughter.    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   Tobacco: 20 pack-yr hx, quit Dec 26, 2007.   Alcohol: rare.   No hx of alc or drug problems.   Exercise: walks 2.40miles on treadmill daily.    Outpatient Medications Prior to Visit  Medication Sig Dispense Refill  . aspirin EC 81 MG tablet Take 1 tablet (81 mg total) by mouth daily. 90 tablet 3  . atorvastatin (LIPITOR) 80 MG tablet TAKE ONE TABLET BY MOUTH EVERY DAY 30 tablet 5  . cholecalciferol (VITAMIN D) 1000 UNITS tablet Take 1,000 Units by mouth daily.    . clopidogrel (PLAVIX) 75 MG tablet TAKE ONE TABLET BY MOUTH DAILY 30 tablet 3  . diazepam (VALIUM) 2 MG tablet TAKE ONE TABLET BY MOUTH TWICE DAILY 60 tablet 5  . fexofenadine (ALLEGRA) 180 MG tablet Take 180 mg by mouth daily as needed for allergies or rhinitis.    Marland Kitchen lisinopril (PRINIVIL,ZESTRIL) 5 MG tablet TAKE ONE TABLET BY MOUTH EVERY DAY 30 tablet 6  . Omega-3 Fatty Acids (FISH OIL) 1000 MG CAPS Take 2,000 mg by mouth daily.     . pantoprazole (PROTONIX) 40 MG tablet TAKE ONE TABLET BY MOUTH TWICE DAILY 60 tablet 3   No facility-administered medications prior to visit.     Allergies  Allergen Reactions  . Ciprocin-Fluocin-Procin [Fluocinolone] Nausea And Vomiting  . Demerol [Meperidine] Nausea And Vomiting  . Fentanyl Nausea And Vomiting    Pt states all pain medications cause N/V for her  . Penicillins Hives    ROS Review of Systems  Constitutional: Negative for appetite change, chills, fatigue and fever.  HENT: Negative for congestion, dental problem, ear pain and sore throat.   Eyes: Negative for discharge, redness and visual disturbance.  Respiratory: Negative for cough, chest tightness, shortness of breath and wheezing.  Cardiovascular: Negative for chest pain, palpitations and leg swelling.  Gastrointestinal: Negative for abdominal pain, blood in stool, diarrhea, nausea and vomiting.  Genitourinary: Negative for difficulty  urinating, dysuria, flank pain, frequency, hematuria and urgency.  Musculoskeletal: Negative for arthralgias, back pain, joint swelling, myalgias and neck stiffness.  Skin: Negative for pallor and rash.  Neurological: Negative for dizziness, speech difficulty, weakness and headaches.  Hematological: Negative for adenopathy. Does not bruise/bleed easily.  Psychiatric/Behavioral: Negative for confusion and sleep disturbance. The patient is not nervous/anxious.     PE; Blood pressure 114/71, pulse 65, temperature 98.3 F (36.8 C), temperature source Oral, resp. rate 16, height 5\' 4"  (1.626 m), weight 141 lb 8 oz (64.2 kg), SpO2 99 %. Body mass index is 24.29 kg/m. Pt examined with Helayne Seminole, CMA, as chaperone.  Gen: Alert, well appearing.  Patient is oriented to person, place, time, and situation. AFFECT: pleasant, lucid thought and speech. ENT: Ears: EACs clear, normal epithelium.  TMs with good light reflex and landmarks bilaterally.  Eyes: no injection, icteris, swelling, or exudate.  EOMI, PERRLA. Nose: no drainage or turbinate edema/swelling.  No injection or focal lesion.  Mouth: lips without lesion/swelling.  Oral mucosa pink and moist.  Dentition intact and without obvious caries or gingival swelling.  Oropharynx without erythema, exudate, or swelling.  Neck: supple/nontender.  No LAD, mass, or TM.  Carotid pulses 2+ bilaterally, with ? Trace bilat bruits. CV: RRR, no m/r/g.   LUNGS: CTA bilat, nonlabored resps, good aeration in all lung fields. ABD: soft, NT, ND, BS normal.  No hepatospenomegaly or mass.  No bruits. EXT: no clubbing, cyanosis, or edema.  Musculoskeletal: no joint swelling, erythema, warmth, or tenderness.  ROM of all joints intact. Skin - no sores or suspicious lesions or rashes or color changes   Pertinent labs:  Lab Results  Component Value Date   TSH 1.37 05/24/2017   Lab Results  Component Value Date   WBC 5.8 05/24/2017   HGB 13.7 05/24/2017    HCT 42.4 05/24/2017   MCV 88.4 05/24/2017   PLT 312.0 05/24/2017   Lab Results  Component Value Date   CREATININE 0.68 11/28/2017   BUN 14 11/28/2017   NA 140 11/28/2017   K 5.0 11/28/2017   CL 105 11/28/2017   CO2 29 11/28/2017   Lab Results  Component Value Date   ALT 22 05/24/2017   AST 22 05/24/2017   ALKPHOS 96 05/24/2017   BILITOT 1.3 (H) 05/24/2017   Lab Results  Component Value Date   CHOL 153 11/28/2017   Lab Results  Component Value Date   HDL 52.80 11/28/2017   Lab Results  Component Value Date   LDLCALC 78 05/24/2017   Lab Results  Component Value Date   TRIG 222.0 (H) 11/28/2017   Lab Results  Component Value Date   CHOLHDL 3 11/28/2017   Lab Results  Component Value Date   HGBA1C 6.2 11/28/2017    ASSESSMENT AND PLAN:   Health maintenance exam: Reviewed age and gender appropriate health maintenance issues (prudent diet, regular exercise, health risks of tobacco and excessive alcohol, use of seatbelts, fire alarms in home, use of sunscreen).  Also reviewed age and gender appropriate health screening as well as vaccine recommendations. Vaccines: UTD.  Shingrix discussed-->rx sent to pt's pharmacy today. Labs: fasting HP + HbA1c (prediabetes). Cervical ca screening: pt is s/p hysterectomy for benign dx.  pt declines any further screening paps. Breast ca screening: due for annual mammogram 05/2018-ordered today.  Osteoporosis screening: DEXA-->due for repeat 05/2018-->ordered today. Colon ca screening: cologuard neg 05/2017.  Repeat cologuard 05/2020.  An After Visit Summary was printed and given to the patient.  FOLLOW UP:  Return in about 6 months (around 11/29/2018) for routine chronic illness f/u.  Signed:  Crissie Sickles, MD           05/31/2018

## 2018-06-11 DIAGNOSIS — H43812 Vitreous degeneration, left eye: Secondary | ICD-10-CM | POA: Diagnosis not present

## 2018-06-11 DIAGNOSIS — H2512 Age-related nuclear cataract, left eye: Secondary | ICD-10-CM | POA: Diagnosis not present

## 2018-06-11 DIAGNOSIS — H0015 Chalazion left lower eyelid: Secondary | ICD-10-CM | POA: Diagnosis not present

## 2018-06-11 DIAGNOSIS — H1851 Endothelial corneal dystrophy: Secondary | ICD-10-CM | POA: Diagnosis not present

## 2018-06-11 DIAGNOSIS — Z961 Presence of intraocular lens: Secondary | ICD-10-CM | POA: Diagnosis not present

## 2018-07-25 ENCOUNTER — Other Ambulatory Visit (HOSPITAL_COMMUNITY)
Admission: RE | Admit: 2018-07-25 | Discharge: 2018-07-25 | Disposition: A | Discharge status: Home / Self Care | Payer: PPO | Source: Ambulatory Visit | Attending: Family Medicine | Admitting: Family Medicine

## 2018-07-25 ENCOUNTER — Ambulatory Visit (INDEPENDENT_AMBULATORY_CARE_PROVIDER_SITE_OTHER): Payer: PPO | Admitting: Family Medicine

## 2018-07-25 ENCOUNTER — Encounter: Payer: Self-pay | Admitting: Family Medicine

## 2018-07-25 VITALS — BP 154/83 | HR 92 | Temp 97.3°F | Resp 20 | Ht 64.0 in | Wt 145.0 lb

## 2018-07-25 DIAGNOSIS — B379 Candidiasis, unspecified: Secondary | ICD-10-CM

## 2018-07-25 DIAGNOSIS — R350 Frequency of micturition: Secondary | ICD-10-CM

## 2018-07-25 DIAGNOSIS — N898 Other specified noninflammatory disorders of vagina: Secondary | ICD-10-CM | POA: Insufficient documentation

## 2018-07-25 LAB — POC URINALSYSI DIPSTICK (AUTOMATED)
BILIRUBIN UA: NEGATIVE
GLUCOSE UA: NEGATIVE
Ketones, UA: NEGATIVE
Leukocytes, UA: NEGATIVE
NITRITE UA: NEGATIVE
Protein, UA: NEGATIVE
RBC UA: NEGATIVE
SPEC GRAV UA: 1.015 (ref 1.010–1.025)
Urobilinogen, UA: 0.2 E.U./dL
pH, UA: 7 (ref 5.0–8.0)

## 2018-07-25 MED ORDER — FLUCONAZOLE 150 MG PO TABS: ORAL_TABLET | ORAL | 0 refills | Status: DC

## 2018-07-25 NOTE — Progress Notes (Signed)
[name] , 12/04/1950, 67 y.o., female MRN: [id number] Patient Care Team    Relationship Specialty Notifications Start End  McGowen, Adrian Blackwater, MD PCP - General Family Medicine  04/06/15   Jettie Booze, MD Attending Physician Cardiology  08/06/12   Serafina Mitchell, MD Consulting Physician Vascular Surgery  04/05/15   Roseanne Kaufman, MD Consulting Physician Orthopedic Surgery  04/05/15   Clarene Essex, MD Consulting Physician Gastroenterology  05/23/16   Lavonna Monarch, MD Consulting Physician Dermatology  03/15/18     Chief Complaint  Patient presents with  . Urinary Frequency    burning, itching and low back pain     Subjective: Pt presents for an OV with complaints of urinary frequency of 1 week duration.  Associated symptoms include urinary incontinence x2, unable to get to the bathroom on time. She also endorses vaginal itching. She has tried AZO last week and then switched to monistat vaginal cream and has been using intravaginally application nightly for 7 days. She reports the itchiness remains. She has not been sexually active in 3 years since her husband passed away. She reports not noticed any vaginal dryness or itching until 1 week ago it came on abruptly. She also endorses mild LLQ discomfort and left low back discomfort but not painful enough to need medications. She has had kidney stones in the past.   Depression screen [facility] 2/9 05/31/2018 11/28/2017 05/24/2017  Decreased Interest 0 0 0  Down, Depressed, Hopeless 0 0 1  PHQ - 2 Score 0 0 1  Altered sleeping 0 0 -  Tired, decreased energy 0 0 -  Change in appetite 0 0 -  Feeling bad or failure about yourself  0 0 -  Trouble concentrating 0 0 -  Moving slowly or fidgety/restless 0 0 -  Suicidal thoughts 0 0 -  PHQ-9 Score 0 0 -    Allergies  Allergen Reactions  . Ciprocin-Fluocin-Procin [Fluocinolone] Nausea And Vomiting  . Demerol [Meperidine] Nausea And Vomiting  . Fentanyl Nausea And Vomiting    Pt states  all pain medications cause N/V for her  . Penicillins Hives   Social History   Tobacco Use  . Smoking status: Former Smoker    Types: Cigarettes    Last attempt to quit: 08/06/2002    Years since quitting: 15.9  . Smokeless tobacco: Never Used  Substance Use Topics  . Alcohol use: No   Past Medical History:  Diagnosis Date  . Abdominal bruit 04/2017   Aortic u/s showed NO ANEURISM.  Marland Kitchen Anxiety   . BCC (basal cell carcinoma), arm, right   . Carotid artery occlusion   . Colon cancer screening 05/2017   Cologuard NEG--repeat 3 yrs.  . Essential hypertension, benign 05/22/2014  . Melanoma ([place])   . Mitral valve prolapse   . Mixed hyperlipidemia 05/22/2014  . Osteopenia 06/06/2016   DEXA T-score -1.5.  Repeat DEXA 2 yrs.  . Peripheral vascular disease, unspecified ([place]) 05/22/2014   innominate and carotid.  U/S f/u 12/2016 showed no signif change compared to 2017--vascular recommended repeat 1 yr.  . Prediabetes 2018   2018 A1c 6.3%.  05/2018 A1c 6.1%.  . Reflux esophagitis   . Stroke [facility]) 2006   Mini  Stroke- not major  . Tobacco abuse, in remission    quit 2009   Past Surgical History:  Procedure Laterality Date  . ABDOMINAL HYSTERECTOMY  1997  . CARDIAC CATHETERIZATION  01/2012   NORMAL  . CAROTID ENDARTERECTOMY  08/21/2007   right - Dr. Amedeo Plenty  . CHOLECYSTECTOMY  2000  . DEXA  06/06/2016   T-score -1.5.  Repeat 2 yrs.  . Incision and drainage of left thenar space abscess  02/23/15   s/p cat bite (Dr. Amedeo Plenty)  . innominate stent  2007   12/2014 u/s showed patent stent in innominate and L common carotid.  L prox int carotid 40-59% stenosis  . Left common carotid stent  2006   "                    "                  "                          "                             "  . TRANSTHORACIC ECHOCARDIOGRAM  2007   NORMAL   Family History  Problem Relation Age of Onset  . Heart attack Mother   . CAD Mother   . Heart disease Mother        Before age 65  . Hypertension  Mother   . Hyperlipidemia Mother   . Hypertension Father   . Pneumonia Father   . Hyperlipidemia Father   . Heart murmur Sister   . Osteoporosis Paternal Grandmother   . CAD Paternal Grandfather   . Breast cancer Neg Hx    Allergies as of 07/25/2018      Reactions   Ciprocin-fluocin-procin [fluocinolone] Nausea And Vomiting   Demerol [meperidine] Nausea And Vomiting   Fentanyl Nausea And Vomiting   Pt states all pain medications cause N/V for her   Penicillins Hives      Medication List        Accurate as of 07/25/18  9:25 AM. Always use your most recent med list.          aspirin EC 81 MG tablet Take 1 tablet (81 mg total) by mouth daily.   atorvastatin 80 MG tablet Commonly known as:  LIPITOR TAKE ONE TABLET BY MOUTH EVERY DAY   cholecalciferol 1000 units tablet Commonly known as:  VITAMIN D Take 1,000 Units by mouth daily.   clopidogrel 75 MG tablet Commonly known as:  PLAVIX TAKE ONE TABLET BY MOUTH DAILY   diazepam 2 MG tablet Commonly known as:  VALIUM TAKE ONE TABLET BY MOUTH TWICE DAILY   fexofenadine 180 MG tablet Commonly known as:  ALLEGRA Take 180 mg by mouth daily as needed for allergies or rhinitis.   Fish Oil 1000 MG Caps Take 2,000 mg by mouth daily.   lisinopril 5 MG tablet Commonly known as:  PRINIVIL,ZESTRIL TAKE ONE TABLET BY MOUTH EVERY DAY   pantoprazole 40 MG tablet Commonly known as:  PROTONIX TAKE ONE TABLET BY MOUTH TWICE DAILY       All past medical history, surgical history, allergies, family history, immunizations andmedications were updated in the EMR today and reviewed under the history and medication portions of their EMR.     ROS: Negative, with the exception of above mentioned in HPI   Objective:  BP (!) 154/83 (BP Location: Left Arm, Patient Position: Sitting, Cuff Size: Normal)   Pulse 92   Temp (!) 97.3 F (36.3 C)   Resp 20   Ht 5\' 4"  (1.626 m)  Wt 145 lb (65.8 kg)   SpO2 98%   BMI 24.89 kg/m  Body  mass index is 24.89 kg/m. Gen: Afebrile. No acute distress. Nontoxic in appearance, well developed, well nourished.  HENT: AT. [place].  MMM, no oral lesions.  Eyes:Pupils Equal Round Reactive to light, Extraocular movements intact,  Conjunctiva without redness, discharge or icterus. CV: RRR  Chest: CTAB, no wheeze or crackles. Good air movement, normal resp effort.  Abd: Soft. NTND. BS present. no Masses palpated. No rebound or guarding.  MSK: No CVA tenderness bilaterally.  Neuro: Normal gait. PERLA. EOMi. Alert. Oriented x3  Psych: Normal affect, dress and demeanor. Normal speech. Normal thought content and judgment. GYN:  External genitalia within normal limits, normal hair distribution, no lesions. Urethral meatus normal, no lesions. Vaginal mucosa pink, moist, normal rugae for age, no lesions. No cystocele or rectocele. Mild white discharge. cervix absent/hysterectomy.  No bladder/suprapubic fullness, masses or tenderness.No adnexal fullness. Anus and perineum within normal limits, no lesions.   No exam data present No results found. Results for orders placed or performed in visit on 07/25/18 (from the past 24 hour(s))  POCT Urinalysis Dipstick (Automated)     Status: None   Collection Time: 07/25/18  9:21 AM  Result Value Ref Range   Color, UA light yellow    Clarity, UA clear    Glucose, UA Negative Negative   Bilirubin, UA negative    Ketones, UA negative    Spec Grav, UA 1.015 1.010 - 1.025   Blood, UA negative    pH, UA 7.0 5.0 - 8.0   Protein, UA Negative Negative   Urobilinogen, UA 0.2 0.2 or 1.0 E.U./dL   Nitrite, UA negative    Leukocytes, UA Negative Negative    Assessment/Plan: LYNNSEY BARBARA is a 67 y.o. female present for OV for  Urinary frequency Could be related to yeast infection. POCT urine normal. Will send for culture.  - POCT Urinalysis Dipstick (Automated) - Urine Culture - F/U 1 week with blood work (CBC and BMP) if symptoms do not resolve or sooner if  worsening.   Vaginal discharge/Yeast infection - suspect yeast infection as cause, but this does not explain her back discomfort. There was mild discharge on exam only. Sent for wet prep. Treated with diflucan.  - consider atrophic vaginitis as potential cause, but this occurred rather abruptly.  - Cervicovaginal ancillary only( [place]) - fluconazole (DIFLUCAN) 150 MG tablet; 1 tab today, repeat dose in 3 days.  Dispense: 2 tablet; Refill: 0 - f/u with PCP if no improvement in 1 week or worsening    * BP elevated, pt encouraged to follow up with PCP concerning issue   Reviewed expectations re: course of current medical issues.  Discussed self-management of symptoms.  Outlined signs and symptoms indicating need for more acute intervention.  Patient verbalized understanding and all questions were answered.  Patient received an After-Visit Summary.    Orders Placed This Encounter  Procedures  . POCT Urinalysis Dipstick (Automated)     Note is dictated utilizing voice recognition software. Although note has been proof read prior to signing, occasional typographical errors still can be missed. If any questions arise, please do not hesitate to call for verification.   electronically signed by:  Howard Pouch, DO  [place]

## 2018-07-25 NOTE — Patient Instructions (Signed)
take diflucan pill today. You can repeat dose in 3 days if symptoms not resolved.  We will call you when urine culture results and cytology results are received.    Vaginal Yeast infection, Adult Vaginal yeast infection is a condition that causes soreness, swelling, and redness (inflammation) of the vagina. It also causes vaginal discharge. This is a common condition. Some women get this infection frequently. What are the causes? This condition is caused by a change in the normal balance of the yeast (candida) and bacteria that live in the vagina. This change causes an overgrowth of yeast, which causes the inflammation. What increases the risk? This condition is more likely to develop in:  Women who take antibiotic medicines.  Women who have diabetes.  Women who take birth control pills.  Women who are pregnant.  Women who douche often.  Women who have a weak defense (immune) system.  Women who have been taking steroid medicines for a long time.  Women who frequently wear tight clothing.  What are the signs or symptoms? Symptoms of this condition include:  White, thick vaginal discharge.  Swelling, itching, redness, and irritation of the vagina. The lips of the vagina (vulva) may be affected as well.  Pain or a burning feeling while urinating.  Pain during sex.  How is this diagnosed? This condition is diagnosed with a medical history and physical exam. This will include a pelvic exam. Your health care provider will examine a sample of your vaginal discharge under a microscope. Your health care provider may send this sample for testing to confirm the diagnosis. How is this treated? This condition is treated with medicine. Medicines may be over-the-counter or prescription. You may be told to use one or more of the following:  Medicine that is taken orally.  Medicine that is applied as a cream.  Medicine that is inserted directly into the vagina (suppository).  Follow  these instructions at home:  Take or apply over-the-counter and prescription medicines only as told by your health care provider.  Do not have sex until your health care provider has approved. Tell your sex partner that you have a yeast infection. That person should go to his or her health care provider if he or she develops symptoms.  Do not wear tight clothes, such as pantyhose or tight pants.  Avoid using tampons until your health care provider approves.  Eat more yogurt. This may help to keep your yeast infection from returning.  Try taking a sitz bath to help with discomfort. This is a warm water bath that is taken while you are sitting down. The water should only come up to your hips and should cover your buttocks. Do this 3-4 times per day or as told by your health care provider.  Do not douche.  Wear breathable, cotton underwear.  If you have diabetes, keep your blood sugar levels under control. Contact a health care provider if:  You have a fever.  Your symptoms go away and then return.  Your symptoms do not get better with treatment.  Your symptoms get worse.  You have new symptoms.  You develop blisters in or around your vagina.  You have blood coming from your vagina and it is not your menstrual period.  You develop pain in your abdomen. This information is not intended to replace advice given to you by your health care provider. Make sure you discuss any questions you have with your health care provider. Document Released: 06/22/2005 Document Revised: 02/24/2016  Document Reviewed: 03/16/2015 Elsevier Interactive Patient Education  [employer].

## 2018-07-26 ENCOUNTER — Encounter: Payer: Self-pay | Admitting: Family Medicine

## 2018-07-26 ENCOUNTER — Ambulatory Visit
Admission: RE | Admit: 2018-07-26 | Discharge: 2018-07-26 | Disposition: A | Discharge status: Home / Self Care | Payer: PPO | Source: Ambulatory Visit | Attending: Family Medicine | Admitting: Family Medicine

## 2018-07-26 DIAGNOSIS — M8580 Other specified disorders of bone density and structure, unspecified site: Secondary | ICD-10-CM

## 2018-07-26 DIAGNOSIS — Z1231 Encounter for screening mammogram for malignant neoplasm of breast: Secondary | ICD-10-CM | POA: Diagnosis not present

## 2018-07-26 DIAGNOSIS — M810 Age-related osteoporosis without current pathological fracture: Secondary | ICD-10-CM

## 2018-07-26 DIAGNOSIS — E2839 Other primary ovarian failure: Secondary | ICD-10-CM

## 2018-07-26 DIAGNOSIS — M85832 Other specified disorders of bone density and structure, left forearm: Secondary | ICD-10-CM | POA: Diagnosis not present

## 2018-07-26 DIAGNOSIS — Z780 Asymptomatic menopausal state: Secondary | ICD-10-CM | POA: Diagnosis not present

## 2018-07-26 LAB — CERVICOVAGINAL ANCILLARY ONLY
BACTERIAL VAGINITIS: NEGATIVE
CANDIDA VAGINITIS: NEGATIVE
CHLAMYDIA, DNA PROBE: NEGATIVE
Neisseria Gonorrhea: NEGATIVE
Trichomonas: NEGATIVE

## 2018-07-27 ENCOUNTER — Telehealth: Payer: Self-pay | Admitting: Family Medicine

## 2018-07-27 LAB — URINE CULTURE
MICRO NUMBER: 91306402
SPECIMEN QUALITY:: ADEQUATE

## 2018-07-27 MED ORDER — SULFAMETHOXAZOLE-TRIMETHOPRIM 800-160 MG PO TABS: 1.0000 | ORAL_TABLET | Freq: Two times a day (BID) | ORAL | 0 refills | Status: DC

## 2018-07-27 NOTE — Telephone Encounter (Signed)
Patient notified and verbalized understanding. 

## 2018-07-27 NOTE — Telephone Encounter (Signed)
These call patient inform her that her urine culture was positive for Klebsiella pneumonia bacteria in her urine. I have called in antibiotic and called Bactrim, it is taken every 12 hours for 10 days.

## 2018-07-30 ENCOUNTER — Telehealth: Payer: Self-pay

## 2018-07-30 NOTE — Telephone Encounter (Signed)
Noted  

## 2018-07-30 NOTE — Telephone Encounter (Signed)
[facility] note received via fax  Call:     Date: 07/28/2018  Time:  5:41 pm   Caller: Carrianne Hyun Return number: [phone]  Nurse: Adair Laundry, RN  Chief Complaint: Vomiting  Reason for call: "Caller states she was at the office Wednesday for a UTI. She was put on some medicine for this an she is very sick today. She is running a fever of 100.3 and she is having vomiting and diarrhea. She has taken two Tylenol for her fever. Sulmethoxazole."  Related visit to physician within the last 2 weeks: Yes  Guideline: UTI on antibiotic < 24 hours  Disposition:  Home care  SW patient regarding symptoms. Patient states GI and UTI symptoms have resolved.

## 2018-08-06 ENCOUNTER — Other Ambulatory Visit: Payer: Self-pay | Admitting: Family Medicine

## 2018-08-06 NOTE — Telephone Encounter (Signed)
RF request for valium.     Last OV 05/31/18 Next OV 11/28/18 Last RX 03/09/18 # 60 x 5 rfs.  Please advise.

## 2018-08-27 NOTE — Progress Notes (Signed)
Cardiology Office Note   Date:  08/29/2018   ID:  [name], DOB [birth year], MRN [id number]  PCP:  Tammi Sou, MD    No chief complaint on file.  PAD  Wt Readings from Last 3 Encounters:  08/29/18 143 lb 3.2 oz (65 kg)  07/25/18 145 lb (65.8 kg)  05/31/18 141 lb 8 oz (64.2 kg)       History of Present Illness: [name] is a 67 y.o. female  with a family history of significant coronary artery disease. She has a history of early vascular disease and difficult to control hyperlipidemia. She has had significant carotid disease in the past with stents placed in the right innominate artery 2007) and left common carotid artery (2006). Right CEA in 2008.  She had a cardiac cath in 2013 showing no significant coronary artery disease and only minimal coronary calcification.   Her husband passed away in September 26, 2015. He was at work in [place] when he died from a viral illness. It was very quick.   Denies : Chest pain. Dizziness. Leg edema. Nitroglycerin use. Orthopnea. Palpitations. Paroxysmal nocturnal dyspnea. Shortness of breath. Syncope.   She has occasional BP spikes- she can feel "funny feelings" in her head.  Readings at those times will be in the 130's/90s.  No readings in the 160s.  She walks on the treadmill for exercise, 6 days/week, 40 minutes at a time.  Diet is healthy.  She has a salad daily and a protein shake as well.  Dinner is the worst for her because she eats canned soup.     Past Medical History:  Diagnosis Date  . Abdominal bruit 04/2017   Aortic u/s showed NO ANEURISM.  Marland Kitchen Anxiety   . BCC (basal cell carcinoma), arm, right   . Carotid artery occlusion   . Colon cancer screening 05/2017   Cologuard NEG--repeat 3 yrs.  . Essential hypertension, benign 05/22/2014  . Melanoma ([place])   . Mitral valve prolapse   . Mixed hyperlipidemia 05/22/2014  . Osteopenia 06/06/2016   05/2016 DEXA T-score -1.5.  06/2018 DEXA T score -1.1  .  Peripheral vascular disease, unspecified ([place]) 05/22/2014   innominate and carotid.  U/S f/u 12/2016 showed no signif change compared to 2017--vascular recommended repeat 1 yr.  . Prediabetes 2018   2018 A1c 6.3%.  05/2018 A1c 6.1%.  . Reflux esophagitis   . Stroke [facility]) 2006   Mini  Stroke- not major  . Tobacco abuse, in remission    quit 2009    Past Surgical History:  Procedure Laterality Date  . ABDOMINAL HYSTERECTOMY  1997  . CARDIAC CATHETERIZATION  01/2012   NORMAL  . CAROTID ENDARTERECTOMY  08/21/2007   right - Dr. Amedeo Plenty  . CHOLECYSTECTOMY  2000  . DEXA  06/06/2016   05/2016 T-score -1.5.  06/2018 T score -1.1.  Repeat 2 yrs.  . Incision and drainage of left thenar space abscess  02/23/15   s/p cat bite (Dr. Amedeo Plenty)  . innominate stent  2007   12/2014 u/s showed patent stent in innominate and L common carotid.  L prox int carotid 40-59% stenosis  . Left common carotid stent  2006   "                    "                  "                          "                             "  .  TRANSTHORACIC ECHOCARDIOGRAM  2007   NORMAL     Current Outpatient Medications  Medication Sig Dispense Refill  . aspirin EC 81 MG tablet Take 1 tablet (81 mg total) by mouth daily. 90 tablet 3  . atorvastatin (LIPITOR) 80 MG tablet TAKE ONE TABLET BY MOUTH EVERY DAY 30 tablet 5  . cholecalciferol (VITAMIN D) 1000 UNITS tablet Take 1,000 Units by mouth daily.    . clopidogrel (PLAVIX) 75 MG tablet TAKE ONE TABLET BY MOUTH DAILY 30 tablet 3  . diazepam (VALIUM) 2 MG tablet TAKE ONE TABLET BY MOUTH TWICE DAILY 60 tablet 5  . fexofenadine (ALLEGRA) 180 MG tablet Take 180 mg by mouth daily as needed for allergies or rhinitis.    Marland Kitchen lisinopril (PRINIVIL,ZESTRIL) 5 MG tablet TAKE ONE TABLET BY MOUTH EVERY DAY 30 tablet 6  . Omega-3 Fatty Acids (FISH OIL) 1000 MG CAPS Take 2,000 mg by mouth daily.     . pantoprazole (PROTONIX) 40 MG tablet TAKE ONE TABLET BY MOUTH TWICE DAILY 60 tablet 3   No current  facility-administered medications for this visit.     Allergies:   Ciprocin-fluocin-procin [fluocinolone]; Demerol [meperidine]; Fentanyl; and Penicillins    Social History:  The patient  reports that she quit smoking about 16 years ago. Her smoking use included cigarettes. She has never used smokeless tobacco. She reports that she does not drink alcohol or use drugs.   Family History:  The patient's family history includes CAD in her mother and paternal grandfather; Heart attack in her mother; Heart disease in her mother; Heart murmur in her sister; Hyperlipidemia in her father and mother; Hypertension in her father and mother; Osteoporosis in her paternal grandmother; Pneumonia in her father.    ROS:  Please see the history of present illness.   Otherwise, review of systems are positive for fatigue after work- she will be cutting back.   All other systems are reviewed and negative.    PHYSICAL EXAM: VS:  BP 130/72   Pulse 75   Ht 5\' 4"  (1.626 m)   Wt 143 lb 3.2 oz (65 kg)   SpO2 95%   BMI 24.58 kg/m  , BMI Body mass index is 24.58 kg/m. GEN: Well nourished, well developed, in no acute distress  HEENT: normal  Neck: no JVD, carotid bruits, or masses Cardiac: RRR; no murmurs, rubs, or gallops,no edema  Respiratory:  clear to auscultation bilaterally, normal work of breathing GI: soft, nontender, nondistended, + BS, no pulsatile mass can be palpated MS: no deformity or atrophy  Skin: warm and dry, no rash Neuro:  Strength and sensation are intact Psych: euthymic mood, full affect   EKG:   The ekg ordered today demonstrates NSR, no ST changes   Recent Labs: 05/31/2018: ALT 26; BUN 13; Creatinine, Ser 0.68; Hemoglobin 14.4; Platelets 293.0; Potassium 5.2; Sodium 140; TSH 0.92   Lipid Panel    Component Value Date/Time   CHOL 141 05/31/2018 0851   TRIG 146.0 05/31/2018 0851   HDL 46.70 05/31/2018 0851   CHOLHDL 3 05/31/2018 0851   VLDL 29.2 05/31/2018 0851   LDLCALC 65  05/31/2018 0851   LDLDIRECT 74.0 11/28/2017 0828     Other studies Reviewed: Additional studies/ records that were reviewed today with results demonstrating: labs revewed.   ASSESSMENT AND PLAN:  1. PAD: Continues to do well on aggressive secondary prevention.  No bleeding problems reported.  Continue aspirin and Plavix at this time.  If there was a bleeding issue, can  certainly stop aspirin.  Given her early onset of vascular disease, would prefer to keep her on clopidogrel. 2. Hyperlipidemia: Well controlled.  LDL checked in September 2019 and was 65.  Continue atorvastatin. 3. HTN: Well-controlled for the most part.  Even her spikes in blood pressure are not terribly high.  Minimize salt intake.  She does eat a lot of canned chicken noodle soup, but drains the liquid and tries to rinse off the other contents.  SHe will let us know if she has more significant spikes in BP. 4. Aortic atherosclerosis: Lipids well controlled.  No sign of aneurysm on exam.   Current medicines are reviewed at length with the patient today.  The patient concerns regarding her medicines were addressed.  The following changes have been made:  No change  Labs/ tests ordered today include:  No orders of the defined types were placed in this encounter.   Recommend 150 minutes/week of aerobic exercise Low fat, low carb, high fiber diet recommended  Disposition:   FU in 1 year   Signed, Larae Grooms, MD  08/29/2018 8:30 AM    [address], [place], [place]  [address] Phone: [phone]; Fax: [phone]

## 2018-08-29 ENCOUNTER — Encounter: Payer: Self-pay | Admitting: Interventional Cardiology

## 2018-08-29 ENCOUNTER — Ambulatory Visit: Payer: PPO | Admitting: Interventional Cardiology

## 2018-08-29 VITALS — BP 130/72 | HR 75 | Ht 64.0 in | Wt 143.2 lb

## 2018-08-29 DIAGNOSIS — E782 Mixed hyperlipidemia: Secondary | ICD-10-CM

## 2018-08-29 DIAGNOSIS — I10 Essential (primary) hypertension: Secondary | ICD-10-CM | POA: Diagnosis not present

## 2018-08-29 DIAGNOSIS — I700 Atherosclerosis of aorta: Secondary | ICD-10-CM

## 2018-08-29 DIAGNOSIS — I739 Peripheral vascular disease, unspecified: Secondary | ICD-10-CM | POA: Diagnosis not present

## 2018-08-29 NOTE — Patient Instructions (Signed)

## 2018-08-30 ENCOUNTER — Encounter: Payer: Self-pay | Admitting: Family Medicine

## 2018-09-07 ENCOUNTER — Other Ambulatory Visit: Payer: Self-pay | Admitting: Family Medicine

## 2018-10-05 ENCOUNTER — Ambulatory Visit (INDEPENDENT_AMBULATORY_CARE_PROVIDER_SITE_OTHER): Payer: PPO | Admitting: Physician Assistant

## 2018-10-05 ENCOUNTER — Encounter: Payer: Self-pay | Admitting: Physician Assistant

## 2018-10-05 VITALS — BP 140/80 | HR 92 | Temp 98.6°F | Ht 64.0 in | Wt 146.0 lb

## 2018-10-05 DIAGNOSIS — J0100 Acute maxillary sinusitis, unspecified: Secondary | ICD-10-CM | POA: Diagnosis not present

## 2018-10-05 MED ORDER — DOXYCYCLINE HYCLATE 100 MG PO TABS: 100.0000 mg | ORAL_TABLET | Freq: Two times a day (BID) | ORAL | 0 refills | Status: DC

## 2018-10-05 MED ORDER — FLUTICASONE PROPIONATE 50 MCG/ACT NA SUSP: 2.0000 | Freq: Every day | NASAL | 2 refills | Status: DC

## 2018-10-05 NOTE — Progress Notes (Signed)
[name] is a 68 y.o. female here for a new problem.  I acted as a Education administrator for [employer], PA-C Anselmo Pickler, LPN  History of Present Illness:   Chief Complaint  Patient presents with  . Sinus Problem    Sinus Problem  This is a new problem. Episode onset: Started 2 days ago. The problem has been gradually worsening since onset. There has been no fever. Her pain is at a severity of 7/10. The pain is moderate. Associated symptoms include sneezing and a sore throat. Pertinent negatives include no coughing, ear pain, headaches or neck pain. (Left side Facial pain) Treatments tried: Allegra, Mucinex and essential oils, Tylenol. The treatment provided no relief.    Past Medical History:  Diagnosis Date  . Abdominal bruit 04/2017   Aortic u/s showed NO ANEURISM.  Marland Kitchen Anxiety   . BCC (basal cell carcinoma), arm, right   . Carotid artery occlusion   . Colon cancer screening 05/2017   Cologuard NEG--repeat 3 yrs.  . Essential hypertension, benign 05/22/2014  . Melanoma ([place])   . Mitral valve prolapse   . Mixed hyperlipidemia 05/22/2014  . Osteopenia 06/06/2016   05/2016 DEXA T-score -1.5.  06/2018 DEXA T score -1.1  . Peripheral vascular disease, unspecified ([employer]) 05/22/2014   innominate and carotid.  U/S f/u 12/2016 showed no signif change compared to 2017--vascular recommended repeat 1 yr.  Doing well as of 08/2018 cardiol f/u-->continued on ASA and Plavix.  . Prediabetes 2018   2018 A1c 6.3%.  05/2018 A1c 6.1%.  . Reflux esophagitis   . Stroke [facility]) 2006   Mini  Stroke- not major  . Tobacco abuse, in remission    quit 2009     Social History   Socioeconomic History  . Marital status: Married    Spouse name: Not on file  . Number of children: Not on file  . Years of education: Not on file  . Highest education level: Not on file  Occupational History  . Not on file  Social Needs  . Financial resource strain: Not on file  . Food insecurity:    Worry: Not on file     Inability: Not on file  . Transportation needs:    Medical: Not on file    Non-medical: Not on file  Tobacco Use  . Smoking status: Former Smoker    Types: Cigarettes    Last attempt to quit: 08/06/2002    Years since quitting: 16.1  . Smokeless tobacco: Never Used  Substance and Sexual Activity  . Alcohol use: No  . Drug use: No  . Sexual activity: Not on file  Lifestyle  . Physical activity:    Days per week: Not on file    Minutes per session: Not on file  . Stress: Not on file  Relationships  . Social connections:    Talks on phone: Not on file    Gets together: Not on file    Attends religious service: Not on file    Active member of club or organization: Not on file    Attends meetings of clubs or organizations: Not on file    Relationship status: Not on file  . Intimate partner violence:    Fear of current or ex partner: Not on file    Emotionally abused: Not on file    Physically abused: Not on file    Forced sexual activity: Not on file  Other Topics Concern  . Not on file  Social History  Narrative   Widowed as of 2016 (husband died of viral illness while out of town in [place], [place]), has one daughter.   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   Tobacco: 20 pack-yr hx, quit 2009.   Alcohol: rare.   No hx of alc or drug problems.   Exercise: walks 2.15miles on treadmill daily.    Past Surgical History:  Procedure Laterality Date  . ABDOMINAL HYSTERECTOMY  1997  . CARDIAC CATHETERIZATION  01/2012   NORMAL  . CAROTID ENDARTERECTOMY  08/21/2007   right - Dr. Amedeo Plenty  . CHOLECYSTECTOMY  2000  . DEXA  06/06/2016   05/2016 T-score -1.5.  06/2018 T score -1.1.  Repeat 2 yrs.  . Incision and drainage of left thenar space abscess  02/23/15   s/p cat bite (Dr. Amedeo Plenty)  . innominate stent  2007   12/2014 u/s showed patent stent in innominate and L common carotid.  L prox int carotid 40-59% stenosis  . Left common carotid  stent  2006   "                    "                  "                          "                             "  . TRANSTHORACIC ECHOCARDIOGRAM  2007   NORMAL    Family History  Problem Relation Age of Onset  . Heart attack Mother   . CAD Mother   . Heart disease Mother        Before age 51  . Hypertension Mother   . Hyperlipidemia Mother   . Hypertension Father   . Pneumonia Father   . Hyperlipidemia Father   . Heart murmur Sister   . Osteoporosis Paternal Grandmother   . CAD Paternal Grandfather   . Breast cancer Neg Hx     Allergies  Allergen Reactions  . Ciprocin-Fluocin-Procin [Fluocinolone] Nausea And Vomiting  . Demerol [Meperidine] Nausea And Vomiting  . Fentanyl Nausea And Vomiting    Pt states all pain medications cause N/V for her  . Penicillins Hives    Current Medications:   Current Outpatient Medications:  .  aspirin EC 81 MG tablet, Take 1 tablet (81 mg total) by mouth daily., Disp: 90 tablet, Rfl: 3 .  atorvastatin (LIPITOR) 80 MG tablet, TAKE ONE TABLET BY MOUTH EVERY DAY, Disp: 30 tablet, Rfl: 5 .  cholecalciferol (VITAMIN D) 1000 UNITS tablet, Take 1,000 Units by mouth daily., Disp: , Rfl:  .  clopidogrel (PLAVIX) 75 MG tablet, TAKE ONE TABLET BY MOUTH DAILY, Disp: 30 tablet, Rfl: 5 .  diazepam (VALIUM) 2 MG tablet, TAKE ONE TABLET BY MOUTH TWICE DAILY, Disp: 60 tablet, Rfl: 5 .  fexofenadine (ALLEGRA) 180 MG tablet, Take 180 mg by mouth daily as needed for allergies or rhinitis., Disp: , Rfl:  .  lisinopril (PRINIVIL,ZESTRIL) 5 MG tablet, TAKE ONE TABLET BY MOUTH EVERY DAY, Disp: 30 tablet, Rfl: 6 .  Omega-3 Fatty Acids (FISH OIL) 1000 MG CAPS, Take 2,000 mg by mouth daily. , Disp: , Rfl:  .  pantoprazole (PROTONIX) 40 MG tablet, TAKE ONE TABLET BY MOUTH TWICE  DAILY, Disp: 60 tablet, Rfl: 5 .  doxycycline (VIBRA-TABS) 100 MG tablet, Take 1 tablet (100 mg total) by mouth 2 (two) times daily., Disp: 20 tablet, Rfl: 0 .  fluticasone (FLONASE) 50 MCG/ACT  nasal spray, Place 2 sprays into both nostrils daily., Disp: 16 g, Rfl: 2   Review of Systems:   Review of Systems  HENT: Positive for sneezing and sore throat. Negative for ear pain.   Respiratory: Negative for cough.   Musculoskeletal: Negative for neck pain.  Neurological: Negative for headaches.    Vitals:   Vitals:   10/05/18 1243  BP: 140/80  Pulse: 92  Temp: 98.6 F (37 C)  TempSrc: Oral  SpO2: 96%  Weight: 146 lb (66.2 kg)  Height: 5\' 4"  (1.626 m)     Body mass index is 25.06 kg/m.  Physical Exam:   Physical Exam Vitals signs and nursing note reviewed.  Constitutional:      General: She is not in acute distress.    Appearance: She is well-developed. She is not ill-appearing or toxic-appearing.  HENT:     Head: Normocephalic and atraumatic.     Right Ear: Tympanic membrane, ear canal and external ear normal. Tympanic membrane is not erythematous, retracted or bulging.     Left Ear: Tympanic membrane, ear canal and external ear normal. Tympanic membrane is not erythematous, retracted or bulging.     Nose: Mucosal edema, congestion and rhinorrhea present.     Right Sinus: No maxillary sinus tenderness or frontal sinus tenderness.     Left Sinus: Maxillary sinus tenderness present. No frontal sinus tenderness.     Mouth/Throat:     Pharynx: Uvula midline. No posterior oropharyngeal erythema.  Eyes:     General: Lids are normal.     Conjunctiva/sclera: Conjunctivae normal.  Neck:     Trachea: Trachea normal.  Cardiovascular:     Rate and Rhythm: Normal rate and regular rhythm.     Heart sounds: Normal heart sounds, S1 normal and S2 normal.  Pulmonary:     Effort: Pulmonary effort is normal.     Breath sounds: Normal breath sounds. No decreased breath sounds, wheezing, rhonchi or rales.  Lymphadenopathy:     Cervical: No cervical adenopathy.  Skin:    General: Skin is warm and dry.  Neurological:     Mental Status: She is alert.  Psychiatric:         Speech: Speech normal.        Behavior: Behavior normal. Behavior is cooperative.      Assessment and Plan:   Frenchie was seen today for sinus problem.  Diagnoses and all orders for this visit:  Acute maxillary sinusitis, recurrence not specified  Other orders -     fluticasone (FLONASE) 50 MCG/ACT nasal spray; Place 2 sprays into both nostrils daily. -     doxycycline (VIBRA-TABS) 100 MG tablet; Take 1 tablet (100 mg total) by mouth 2 (two) times daily.   No red flags on exam.  Given duration of two days, suspect viral etiology. Start flonase and warm compresses. If no improvement, discussed starting doxycycline around day 7-10. per orders. Discussed taking medications as prescribed. Reviewed return precautions including worsening fever, SOB, worsening cough or other concerns. Push fluids and rest. I recommend that patient follow-up if symptoms worsen or persist despite treatment x 7-10 days, sooner if needed.   . Reviewed expectations re: course of current medical issues. . Discussed self-management of symptoms. . Outlined signs and symptoms indicating need  for more acute intervention. . Patient verbalized understanding and all questions were answered. . See orders for this visit as documented in the electronic medical record. . Patient received an After-Visit Summary.  CMA or LPN served as scribe during this visit. History, Physical, and Plan performed by medical provider. The above documentation has been reviewed and is accurate and complete.   Inda Coke, PA-C

## 2018-10-05 NOTE — Patient Instructions (Signed)
It was great to see you!  You have a viral upper respiratory infection. Antibiotics are not needed for this.  Viral infections usually take 7-10 days to resolve.   1. Use flonase -- 2 sprays in AM and 2 sprays in PM 2. Continue mucinex, make sure you are drinking plenty of water 3. Trial warm compresses 4. If no improvement, or if worsening symptoms, start doxycycline antibiotic  Push fluids and get plenty of rest. Please return if you are not improving as expected, or if you have high fevers (>101.5) or difficulty swallowing or worsening productive cough.  Call clinic with questions.  I hope you start feeling better soon!

## 2018-10-06 ENCOUNTER — Emergency Department (INDEPENDENT_AMBULATORY_CARE_PROVIDER_SITE_OTHER)
Admission: EM | Admit: 2018-10-06 | Discharge: 2018-10-06 | Disposition: A | Discharge status: Home / Self Care | Payer: PPO | Source: Home / Self Care | Attending: Family Medicine | Admitting: Family Medicine

## 2018-10-06 ENCOUNTER — Encounter: Payer: Self-pay | Admitting: Emergency Medicine

## 2018-10-06 DIAGNOSIS — T7840XA Allergy, unspecified, initial encounter: Secondary | ICD-10-CM | POA: Diagnosis not present

## 2018-10-06 MED ORDER — CETIRIZINE HCL 5 MG PO TABS: 5.0000 mg | ORAL_TABLET | Freq: Every day | ORAL | 0 refills | Status: DC

## 2018-10-06 MED ORDER — AZITHROMYCIN 250 MG PO TABS: 250.0000 mg | ORAL_TABLET | Freq: Every day | ORAL | 0 refills | Status: DC

## 2018-10-06 NOTE — ED Provider Notes (Signed)
[facility]    CSN: [id number] Arrival date & time: 10/06/18  0867     History   Chief Complaint Chief Complaint  Patient presents with  . Allergic Reaction    HPI [name] is a 68 y.o. female.   HPI [name] is a 68 y.o. female presenting to UC with c/o upper lip swelling and mild SOB that started last night after starting on doxycycline and Flonase by her PCP for a sinus infection. She took her usual Allegra last night w/o relief. She called her PCP's office this morning, who directed her to be evaluated today. Denies throat or tongue swelling. Denies SOB at this time. No rashes. No other new soaps, lotions, medications, food or cosmetic.    Past Medical History:  Diagnosis Date  . Abdominal bruit 04/2017   Aortic u/s showed NO ANEURISM.  Marland Kitchen Anxiety   . BCC (basal cell carcinoma), arm, right   . Carotid artery occlusion   . Colon cancer screening 05/2017   Cologuard NEG--repeat 3 yrs.  . Essential hypertension, benign 05/22/2014  . Melanoma ([place])   . Mitral valve prolapse   . Mixed hyperlipidemia 05/22/2014  . Osteopenia 06/06/2016   05/2016 DEXA T-score -1.5.  06/2018 DEXA T score -1.1  . Peripheral vascular disease, unspecified ([place]) 05/22/2014   innominate and carotid.  U/S f/u 12/2016 showed no signif change compared to 2017--vascular recommended repeat 1 yr.  Doing well as of 08/2018 cardiol f/u-->continued on ASA and Plavix.  . Prediabetes 2018   2018 A1c 6.3%.  05/2018 A1c 6.1%.  . Reflux esophagitis   . Stroke [facility]) 2006   Mini  Stroke- not major  . Tobacco abuse, in remission    quit 2009    Patient Active Problem List   Diagnosis Date Noted  . Mixed hyperlipidemia 05/22/2014  . Essential hypertension, benign 05/22/2014  . Peripheral vascular disease ([place]) 05/22/2014  . Aftercare following surgery of the circulatory system, [place] 09/17/2013  . Carotid artery disease without cerebral infarction ([place]) 08/06/2012    Past Surgical  History:  Procedure Laterality Date  . ABDOMINAL HYSTERECTOMY  1997  . CARDIAC CATHETERIZATION  01/2012   NORMAL  . CAROTID ENDARTERECTOMY  08/21/2007   right - Dr. Amedeo Plenty  . CHOLECYSTECTOMY  2000  . DEXA  06/06/2016   05/2016 T-score -1.5.  06/2018 T score -1.1.  Repeat 2 yrs.  . Incision and drainage of left thenar space abscess  02/23/15   s/p cat bite (Dr. Amedeo Plenty)  . innominate stent  2007   12/2014 u/s showed patent stent in innominate and L common carotid.  L prox int carotid 40-59% stenosis  . Left common carotid stent  2006   "                    "                  "                          "                             "  . TRANSTHORACIC ECHOCARDIOGRAM  2007   NORMAL    OB History   No obstetric history on file.      Home Medications    Prior to Admission medications   Medication  Sig Start Date End Date Taking? Authorizing Provider  aspirin EC 81 MG tablet Take 1 tablet (81 mg total) by mouth daily. 06/03/15   Jettie Booze, MD  atorvastatin (LIPITOR) 80 MG tablet TAKE ONE TABLET BY MOUTH EVERY DAY 05/30/18   McGowen, Adrian Blackwater, MD  azithromycin (ZITHROMAX) 250 MG tablet Take 1 tablet (250 mg total) by mouth daily. Take first 2 tablets together, then 1 every day until finished. 10/06/18   Noe Gens, PA-C  cetirizine (ZYRTEC) 5 MG tablet Take 1-2 tablets (5-10 mg total) by mouth daily. 10/06/18   Noe Gens, PA-C  cholecalciferol (VITAMIN D) 1000 UNITS tablet Take 1,000 Units by mouth daily.    [provider]  clopidogrel (PLAVIX) 75 MG tablet TAKE ONE TABLET BY MOUTH DAILY 09/10/18   McGowen, Adrian Blackwater, MD  diazepam (VALIUM) 2 MG tablet TAKE ONE TABLET BY MOUTH TWICE DAILY 08/06/18   McGowen, Adrian Blackwater, MD  fexofenadine (ALLEGRA) 180 MG tablet Take 180 mg by mouth daily as needed for allergies or rhinitis.    [provider]  fluticasone (FLONASE) 50 MCG/ACT nasal spray Place 2 sprays into both nostrils daily. 10/05/18   Inda Coke, PA    lisinopril (PRINIVIL,ZESTRIL) 5 MG tablet TAKE ONE TABLET BY MOUTH EVERY DAY 05/07/18   McGowen, Adrian Blackwater, MD  Omega-3 Fatty Acids (FISH OIL) 1000 MG CAPS Take 2,000 mg by mouth daily.     [provider]  pantoprazole (PROTONIX) 40 MG tablet TAKE ONE TABLET BY MOUTH TWICE DAILY 09/10/18   McGowen, Adrian Blackwater, MD    Family History Family History  Problem Relation Age of Onset  . Heart attack Mother   . CAD Mother   . Heart disease Mother        Before age 2  . Hypertension Mother   . Hyperlipidemia Mother   . Hypertension Father   . Pneumonia Father   . Hyperlipidemia Father   . Heart murmur Sister   . Osteoporosis Paternal Grandmother   . CAD Paternal Grandfather   . Breast cancer Neg Hx     Social History Social History   Tobacco Use  . Smoking status: Former Smoker    Types: Cigarettes    Last attempt to quit: 08/06/2002    Years since quitting: 16.1  . Smokeless tobacco: Never Used  Substance Use Topics  . Alcohol use: No  . Drug use: No     Allergies   Doxycycline; Ciprocin-fluocin-procin [fluocinolone]; Demerol [meperidine]; Fentanyl; and Penicillins   Review of Systems Review of Systems  Constitutional: Negative for chills and fever.  HENT: Positive for congestion, facial swelling, sinus pressure and sinus pain. Negative for sore throat.   Respiratory: Positive for shortness of breath. Negative for chest tightness.   Gastrointestinal: Negative for nausea and vomiting.     Physical Exam Triage Vital Signs ED Triage Vitals  Enc Vitals Group     BP 10/06/18 1014 128/83     Pulse Rate 10/06/18 1014 91     Resp --      Temp 10/06/18 1014 98.2 F (36.8 C)     Temp Source 10/06/18 1014 Oral     SpO2 10/06/18 1014 98 %     Weight 10/06/18 1015 143 lb 12 oz (65.2 kg)     Height 10/06/18 1015 5\' 4"  (1.626 m)     Head Circumference --      Peak Flow --      Pain Score 10/06/18 1015  0     Pain Loc --      Pain Edu? --      Excl. in [place]? --     No data found.  Updated Vital Signs BP 128/83 (BP Location: Right Arm)   Pulse 91   Temp 98.2 F (36.8 C) (Oral)   Ht 5\' 4"  (1.626 m)   Wt 143 lb 12 oz (65.2 kg)   SpO2 98%   BMI 24.67 kg/m   Visual Acuity Right Eye Distance:   Left Eye Distance:   Bilateral Distance:    Right Eye Near:   Left Eye Near:    Bilateral Near:     Physical Exam Vitals signs and nursing note reviewed.  Constitutional:      Appearance: Normal appearance. She is well-developed.  HENT:     Head: Normocephalic and atraumatic.     Comments: Mild upper lip swelling. No erythema or warmth    Right Ear: Tympanic membrane normal.     Left Ear: Tympanic membrane normal.     Nose:     Right Sinus: Maxillary sinus tenderness and frontal sinus tenderness present.     Left Sinus: Maxillary sinus tenderness and frontal sinus tenderness present.     Mouth/Throat:     Lips: Pink.     Mouth: Mucous membranes are moist.     Pharynx: Oropharynx is clear. Uvula midline.  Neck:     Musculoskeletal: Normal range of motion.  Cardiovascular:     Rate and Rhythm: Normal rate and regular rhythm.  Pulmonary:     Effort: Pulmonary effort is normal. No respiratory distress.     Breath sounds: Normal breath sounds. No stridor. No wheezing or rhonchi.  Musculoskeletal: Normal range of motion.  Skin:    General: Skin is warm and dry.  Neurological:     Mental Status: She is alert and oriented to person, place, and time.  Psychiatric:        Behavior: Behavior normal.      UC Treatments / Results  Labs (all labs ordered are listed, but only abnormal results are displayed) Labs Reviewed - No data to display  EKG None  Radiology No results found.  Procedures Procedures (including critical care time)  Medications Ordered in UC Medications - No data to display  Initial Impression / Assessment and Plan / UC Course  I have reviewed the triage vital signs and the nursing notes.  Pertinent labs &  imaging results that were available during my care of the patient were reviewed by me and considered in my medical decision making (see chart for details).     Mild upper lip swelling concerning for allergic reaction to doxycycline, will have pt start taking azithromycin instead No evidence of anaphylaxis at this time.  Pt safe for discharge home.  Discussed symptoms that warrant emergent care in the ED.  Final Clinical Impressions(s) / UC Diagnoses   Final diagnoses:  Allergic reaction, initial encounter     Discharge Instructions      Please stop taking the doxycycline and start taking the azithromycin as prescribed. You may also take the cetirizine to help with lip swelling. You may want to stop the Flonase for a few days to make sure you are not allergic and restart/try again once swelling resolves.  If you develop lip swelling again with Flonase, stop using forever.   Please follow up with family medicine in 3-4 days if not improving. Call 911 or go to the hospital if  symptoms continue to worsen.     ED Prescriptions    Medication Sig Dispense Auth. Provider   azithromycin (ZITHROMAX) 250 MG tablet Take 1 tablet (250 mg total) by mouth daily. Take first 2 tablets together, then 1 every day until finished. 6 tablet Gerarda Fraction, Ceasar Decandia O, PA-C   cetirizine (ZYRTEC) 5 MG tablet Take 1-2 tablets (5-10 mg total) by mouth daily. 20 tablet Noe Gens, PA-C     Controlled Substance Prescriptions [place] Controlled Substance Registry consulted? Not Applicable   Tyrell Antonio 10/07/18 1453

## 2018-10-06 NOTE — Discharge Instructions (Signed)
°  Please stop taking the doxycycline and start taking the azithromycin as prescribed. You may also take the cetirizine to help with lip swelling. You may want to stop the Flonase for a few days to make sure you are not allergic and restart/try again once swelling resolves.  If you develop lip swelling again with Flonase, stop using forever.   Please follow up with family medicine in 3-4 days if not improving. Call 911 or go to the hospital if symptoms continue to worsen.

## 2018-10-06 NOTE — ED Triage Notes (Signed)
Patient was dx w/a sinus infection yesterday and given Flonase and Doxycycline.  Patient noticed during the night, her lips started to swell some, some SOB.

## 2018-10-17 ENCOUNTER — Other Ambulatory Visit: Payer: Self-pay | Admitting: Family Medicine

## 2018-11-26 ENCOUNTER — Encounter: Payer: Self-pay | Admitting: Family Medicine

## 2018-11-26 ENCOUNTER — Ambulatory Visit (INDEPENDENT_AMBULATORY_CARE_PROVIDER_SITE_OTHER): Payer: PPO | Admitting: Family Medicine

## 2018-11-26 VITALS — BP 111/72 | HR 85 | Temp 98.3°F | Resp 20 | Ht 64.0 in | Wt 146.0 lb

## 2018-11-26 DIAGNOSIS — R1310 Dysphagia, unspecified: Secondary | ICD-10-CM

## 2018-11-26 DIAGNOSIS — K219 Gastro-esophageal reflux disease without esophagitis: Secondary | ICD-10-CM | POA: Diagnosis not present

## 2018-11-26 MED ORDER — FAMOTIDINE 40 MG PO TABS: ORAL_TABLET | ORAL | 1 refills | Status: DC

## 2018-11-26 NOTE — Patient Instructions (Signed)
Food Choices for Gastroesophageal Reflux Disease, Adult  When you have gastroesophageal reflux disease (GERD), the foods you eat and your eating habits are very important. Choosing the right foods can help ease the discomfort of GERD. Consider working with a diet and nutrition specialist (dietitian) to help you make healthy food choices.  What general guidelines should I follow?    Eating plan  · Choose healthy foods low in fat, such as fruits, vegetables, whole grains, low-fat dairy products, and lean meat, fish, and poultry.  · Eat frequent, small meals instead of three large meals each day. Eat your meals slowly, in a relaxed setting. Avoid bending over or lying down until 2-3 hours after eating.  · Limit high-fat foods such as fatty meats or fried foods.  · Limit your intake of oils, butter, and shortening to less than 8 teaspoons each day.  · Avoid the following:  ? Foods that cause symptoms. These may be different for different people. Keep a food diary to keep track of foods that cause symptoms.  ? Alcohol.  ? Drinking large amounts of liquid with meals.  ? Eating meals during the 2-3 hours before bed.  · Cook foods using methods other than frying. This may include baking, grilling, or broiling.  Lifestyle  · Maintain a healthy weight. Ask your health care provider what weight is healthy for you. If you need to lose weight, work with your health care provider to do so safely.  · Exercise for at least 30 minutes on 5 or more days each week, or as told by your health care provider.  · Avoid wearing clothes that fit tightly around your waist and chest.  · Do not use any products that contain nicotine or tobacco, such as cigarettes and e-cigarettes. If you need help quitting, ask your health care provider.  · Sleep with the head of your bed raised. Use a wedge under the mattress or blocks under the bed frame to raise the head of the bed.  What foods are not recommended?  The items listed may not be a complete  list. Talk with your dietitian about what dietary choices are best for you.  Grains  Pastries or quick breads with added fat. French toast.  Vegetables  Deep fried vegetables. French fries. Any vegetables prepared with added fat. Any vegetables that cause symptoms. For some people this may include tomatoes and tomato products, chili peppers, onions and garlic, and horseradish.  Fruits  Any fruits prepared with added fat. Any fruits that cause symptoms. For some people this may include citrus fruits, such as oranges, grapefruit, pineapple, and lemons.  Meats and other protein foods  High-fat meats, such as fatty beef or pork, hot dogs, ribs, ham, sausage, salami and bacon. Fried meat or protein, including fried fish and fried chicken. Nuts and nut butters.  Dairy  Whole milk and chocolate milk. Sour cream. Cream. Ice cream. Cream cheese. Milk shakes.  Beverages  Coffee and tea, with or without caffeine. Carbonated beverages. Sodas. Energy drinks. Fruit juice made with acidic fruits (such as orange or grapefruit). Tomato juice. Alcoholic drinks.  Fats and oils  Butter. Margarine. Shortening. Ghee.  Sweets and desserts  Chocolate and cocoa. Donuts.  Seasoning and other foods  Pepper. Peppermint and spearmint. Any condiments, herbs, or seasonings that cause symptoms. For some people, this may include curry, hot sauce, or vinegar-based salad dressings.  Summary  · When you have gastroesophageal reflux disease (GERD), food and lifestyle choices are very   important to help ease the discomfort of GERD.  · Eat frequent, small meals instead of three large meals each day. Eat your meals slowly, in a relaxed setting. Avoid bending over or lying down until 2-3 hours after eating.  · Limit high-fat foods such as fatty meat or fried foods.  This information is not intended to replace advice given to you by your health care provider. Make sure you discuss any questions you have with your health care provider.  Document Released:  09/12/2005 Document Revised: 09/13/2016 Document Reviewed: 09/13/2016  Elsevier Interactive Patient Education © 2019 [employer] Inc[employer]

## 2018-11-26 NOTE — Progress Notes (Signed)
OFFICE VISIT  11/26/2018   CC:  Chief Complaint  Patient presents with  . Gastroesophageal Reflux   HPI:    Patient is a 68 y.o. Caucasian female who presents for f/u GERD.  Several weeks' hx of Substernal burning and some dysphagia that occurs with spicy foods, richer foods, after over-eating. Most days has at least one bad episode.  Burp->regurg.  Occ vomits with this and feels better after. Feels difficulty getting a deep breath during these episodes.  Feels like she is gagging/choking sometimes on her refluxed juices, makes her feel panicky.  Worse when supine.  Bad breath.  Water brash.  No exercise induced sx's. No NSAIDs.  She has seen a GI MD in the remote past but doesn't recall who/where. She has had an EGD in the remote past but doesn't recall when/where and doesn't recall the findings.  ROS: no jaw or L arm pain, no chest pressure, no dizziness, no diaphoresis. No cough or ST or fevers.  Past Medical History:  Diagnosis Date  . Abdominal bruit 04/2017   Aortic u/s showed NO ANEURISM.  Marland Kitchen Anxiety   . BCC (basal cell carcinoma), arm, right   . Carotid artery occlusion   . Colon cancer screening 05/2017   Cologuard NEG--repeat 3 yrs.  . Essential hypertension, benign 05/22/2014  . Melanoma ([place])   . Mitral valve prolapse   . Mixed hyperlipidemia 05/22/2014  . Osteopenia 06/06/2016   05/2016 DEXA T-score -1.5.  06/2018 DEXA T score -1.1  . Peripheral vascular disease, unspecified ([place]) 05/22/2014   innominate and carotid.  U/S f/u 12/2016 showed no signif change compared to 2017--vascular recommended repeat 1 yr.  Doing well as of 08/2018 cardiol f/u-->continued on ASA and Plavix.  . Prediabetes 2018   2018 A1c 6.3%.  05/2018 A1c 6.1%.  . Reflux esophagitis   . Stroke [facility]) 2006   Mini  Stroke- not major  . Tobacco abuse, in remission    quit 2009    Past Surgical History:  Procedure Laterality Date  . ABDOMINAL HYSTERECTOMY  1997  . CARDIAC CATHETERIZATION   01/2012   NORMAL  . CAROTID ENDARTERECTOMY  08/21/2007   right - Dr. Amedeo Plenty  . CHOLECYSTECTOMY  2000  . DEXA  06/06/2016   05/2016 T-score -1.5.  06/2018 T score -1.1.  Repeat 2 yrs.  . Incision and drainage of left thenar space abscess  02/23/15   s/p cat bite (Dr. Amedeo Plenty)  . innominate stent  2007   12/2014 u/s showed patent stent in innominate and L common carotid.  L prox int carotid 40-59% stenosis  . Left common carotid stent  2006   "                    "                  "                          "                             "  . TRANSTHORACIC ECHOCARDIOGRAM  2007   NORMAL    Outpatient Medications Prior to Visit  Medication Sig Dispense Refill  . aspirin EC 81 MG tablet Take 1 tablet (81 mg total) by mouth daily. 90 tablet 3  . atorvastatin (LIPITOR) 80 MG tablet TAKE ONE TABLET  BY MOUTH EVERY DAY 30 tablet 5  . cetirizine (ZYRTEC) 5 MG tablet Take 1-2 tablets (5-10 mg total) by mouth daily. 20 tablet 0  . cholecalciferol (VITAMIN D) 1000 UNITS tablet Take 1,000 Units by mouth daily.    . clopidogrel (PLAVIX) 75 MG tablet TAKE ONE TABLET BY MOUTH DAILY 30 tablet 5  . diazepam (VALIUM) 2 MG tablet TAKE ONE TABLET BY MOUTH TWICE DAILY 60 tablet 5  . fluticasone (FLONASE) 50 MCG/ACT nasal spray Place 2 sprays into both nostrils daily. 16 g 2  . lisinopril (PRINIVIL,ZESTRIL) 5 MG tablet TAKE ONE TABLET BY MOUTH EVERY DAY 30 tablet 6  . Omega-3 Fatty Acids (FISH OIL) 1000 MG CAPS Take 2,000 mg by mouth daily.     . pantoprazole (PROTONIX) 40 MG tablet TAKE ONE TABLET BY MOUTH TWICE DAILY 60 tablet 5  . azithromycin (ZITHROMAX) 250 MG tablet Take 1 tablet (250 mg total) by mouth daily. Take first 2 tablets together, then 1 every day until finished. (Patient not taking: Reported on 11/26/2018) 6 tablet 0  . fexofenadine (ALLEGRA) 180 MG tablet Take 180 mg by mouth daily as needed for allergies or rhinitis.     No facility-administered medications prior to visit.     Allergies   Allergen Reactions  . Doxycycline Shortness Of Breath and Swelling    Lips swelling and short of breath  . Ciprocin-Fluocin-Procin [Fluocinolone] Nausea And Vomiting  . Demerol [Meperidine] Nausea And Vomiting  . Fentanyl Nausea And Vomiting    Pt states all pain medications cause N/V for her  . Penicillins Hives    ROS As per HPI  PE: Blood pressure 111/72, pulse 85, temperature 98.3 F (36.8 C), temperature source Oral, resp. rate 20, height 5\' 4"  (1.626 m), weight 146 lb (66.2 kg), SpO2 99 %. Body mass index is 25.06 kg/m.  Gen: Alert, well appearing.  Patient is oriented to person, place, time, and situation. AFFECT: pleasant, lucid thought and speech. ENT:   EOMI, PERRLA. Nose: no drainage or turbinate edema/swelling.  No injection or focal lesion.  Mouth: lips without lesion/swelling.  Oral mucosa pink and moist.  Dentition intact and without obvious caries or gingival swelling.  Oropharynx without erythema, exudate, or swelling.  CV: RRR, no m/r/g.   LUNGS: CTA bilat, nonlabored resps, good aeration in all lung fields. ABD: soft, NT.  LABS:    Chemistry      Component Value Date/Time   NA 140 05/31/2018 0851   K 5.2 (H) 05/31/2018 0851   CL 104 05/31/2018 0851   CO2 32 05/31/2018 0851   BUN 13 05/31/2018 0851   CREATININE 0.68 05/31/2018 0851      Component Value Date/Time   CALCIUM 10.6 (H) 05/31/2018 0851   ALKPHOS 98 05/31/2018 0851   AST 23 05/31/2018 0851   ALT 26 05/31/2018 0851   BILITOT 1.5 (H) 05/31/2018 0851     Lab Results  Component Value Date   HGBA1C 6.1 05/31/2018    IMPRESSION AND PLAN:  Severe GERD, progressive. With intermittent dysphagia. This is all with taking pantoprazole but she is taking both 40mg  tabs qAM. I'll have her take one BID and I'll start her on pepcid 40mg  with lunch. GERD diet discussed and handout given. Discussed need for gastroenterologist eval and likely EGD. Pt expressed understanding and agreement.  An  After Visit Summary was printed and given to the patient.  FOLLOW UP: Return for keep appt set for 11/28/2018.  Signed:  Crissie Sickles, MD  11/26/2018     

## 2018-11-28 ENCOUNTER — Encounter: Payer: Self-pay | Admitting: Family Medicine

## 2018-11-28 ENCOUNTER — Ambulatory Visit (INDEPENDENT_AMBULATORY_CARE_PROVIDER_SITE_OTHER): Payer: PPO | Admitting: Family Medicine

## 2018-11-28 ENCOUNTER — Encounter: Payer: Self-pay | Admitting: *Deleted

## 2018-11-28 VITALS — BP 136/78 | HR 64 | Temp 97.6°F | Resp 16 | Ht 64.0 in | Wt 146.1 lb

## 2018-11-28 DIAGNOSIS — F411 Generalized anxiety disorder: Secondary | ICD-10-CM | POA: Diagnosis not present

## 2018-11-28 DIAGNOSIS — I10 Essential (primary) hypertension: Secondary | ICD-10-CM | POA: Diagnosis not present

## 2018-11-28 DIAGNOSIS — I739 Peripheral vascular disease, unspecified: Secondary | ICD-10-CM

## 2018-11-28 DIAGNOSIS — E663 Overweight: Secondary | ICD-10-CM

## 2018-11-28 DIAGNOSIS — E7800 Pure hypercholesterolemia, unspecified: Secondary | ICD-10-CM

## 2018-11-28 DIAGNOSIS — R7303 Prediabetes: Secondary | ICD-10-CM | POA: Diagnosis not present

## 2018-11-28 LAB — COMPREHENSIVE METABOLIC PANEL
ALK PHOS: 115 U/L (ref 39–117)
ALT: 23 U/L (ref 0–35)
AST: 22 U/L (ref 0–37)
Albumin: 4.4 g/dL (ref 3.5–5.2)
BUN: 13 mg/dL (ref 6–23)
CO2: 31 mEq/L (ref 19–32)
CREATININE: 0.7 mg/dL (ref 0.40–1.20)
Calcium: 10 mg/dL (ref 8.4–10.5)
Chloride: 103 mEq/L (ref 96–112)
GFR: 83.33 mL/min (ref >60.00)
GLUCOSE: 86 mg/dL (ref 70–99)
Potassium: 4.7 mEq/L (ref 3.5–5.1)
Sodium: 139 mEq/L (ref 135–145)
TOTAL PROTEIN: 7.1 g/dL (ref 6.0–8.3)
Total Bilirubin: 1.4 mg/dL — ABNORMAL HIGH (ref 0.2–1.2)

## 2018-11-28 LAB — LIPID PANEL
Cholesterol: 159 mg/dL (ref 0–200)
HDL: 52.1 mg/dL (ref >39.00)
LDL Cholesterol: 79 mg/dL (ref 0–99)
NONHDL: 106.64
Total CHOL/HDL Ratio: 3
Triglycerides: 137 mg/dL (ref 0.0–149.0)
VLDL: 27.4 mg/dL (ref 0.0–40.0)

## 2018-11-28 LAB — HEMOGLOBIN A1C: Hgb A1c MFr Bld: 6.2 % (ref 4.6–6.5)

## 2018-11-28 NOTE — Progress Notes (Signed)
OFFICE VISIT  11/28/2018   CC:  Chief Complaint  Patient presents with  . Follow-up    RCI, pt is fasting.    HPI:    Patient is a 68 y.o. Caucasian female with a hx of significant peripheral vascular disease (on ASA and PLAVIX) who presents for 6 mo f/u HTN, HLD, prediabetes, and anxiety.  Anxiety: takes a 2 mg valium bid.  Helps well.  No adverse effects.  HTN: home bp's 130/70s avg.  HLD: tolerating statin w/out problem.  Exercise: walks on treadmill.   Diet: not making many changes lately regarding carb intake or high fat foods. +Veggies, bananas/grapes.  No colas or sweet tea.  Tries not to overeat.  ROS: no CP, no SOB, no wheezing, no cough, no dizziness, no HAs, no rashes, no melena/hematochezia.  No polyuria or polydipsia.  No myalgias or arthralgias.   Past Medical History:  Diagnosis Date  . Abdominal bruit 04/2017   Aortic u/s showed NO ANEURISM.  Marland Kitchen Anxiety   . BCC (basal cell carcinoma), arm, right   . Carotid artery occlusion   . Colon cancer screening 05/2017   Cologuard NEG--repeat 3 yrs.  . Essential hypertension, benign 05/22/2014  . Melanoma ([place])   . Mitral valve prolapse   . Mixed hyperlipidemia 05/22/2014  . Osteopenia 06/06/2016   05/2016 DEXA T-score -1.5.  06/2018 DEXA T score -1.1  . Peripheral vascular disease, unspecified ([place]) 05/22/2014   innominate and carotid.  U/S f/u 12/2016 showed no signif change compared to 2017--vascular recommended repeat 1 yr.  Doing well as of 08/2018 cardiol f/u-->continued on ASA and Plavix.  . Prediabetes 2018   2018 A1c 6.3%.  05/2018 A1c 6.1%.  . Reflux esophagitis   . Stroke [facility]) 2006   Mini  Stroke- not major  . Tobacco abuse, in remission    quit 2009    Past Surgical History:  Procedure Laterality Date  . ABDOMINAL HYSTERECTOMY  1997  . CARDIAC CATHETERIZATION  01/2012   NORMAL  . CAROTID ENDARTERECTOMY  08/21/2007   right - Dr. Amedeo Plenty  . CHOLECYSTECTOMY  2000  . DEXA  06/06/2016   05/2016 T-score  -1.5.  06/2018 T score -1.1.  Repeat 2 yrs.  . Incision and drainage of left thenar space abscess  02/23/15   s/p cat bite (Dr. Amedeo Plenty)  . innominate stent  2007   12/2014 u/s showed patent stent in innominate and L common carotid.  L prox int carotid 40-59% stenosis  . Left common carotid stent  2006   "                    "                  "                          "                             "  . TRANSTHORACIC ECHOCARDIOGRAM  2007   NORMAL    Outpatient Medications Prior to Visit  Medication Sig Dispense Refill  . aspirin EC 81 MG tablet Take 1 tablet (81 mg total) by mouth daily. 90 tablet 3  . atorvastatin (LIPITOR) 80 MG tablet TAKE ONE TABLET BY MOUTH EVERY DAY 30 tablet 5  . cetirizine (ZYRTEC) 5 MG tablet Take 1-2 tablets (5-10 mg  total) by mouth daily. 20 tablet 0  . cholecalciferol (VITAMIN D) 1000 UNITS tablet Take 1,000 Units by mouth daily.    . clopidogrel (PLAVIX) 75 MG tablet TAKE ONE TABLET BY MOUTH DAILY 30 tablet 5  . diazepam (VALIUM) 2 MG tablet TAKE ONE TABLET BY MOUTH TWICE DAILY 60 tablet 5  . famotidine (PEPCID) 40 MG tablet 1 tab po with lunch daily 30 tablet 1  . fluticasone (FLONASE) 50 MCG/ACT nasal spray Place 2 sprays into both nostrils daily. 16 g 2  . lisinopril (PRINIVIL,ZESTRIL) 5 MG tablet TAKE ONE TABLET BY MOUTH EVERY DAY 30 tablet 6  . Omega-3 Fatty Acids (FISH OIL) 1000 MG CAPS Take 2,000 mg by mouth daily.     . pantoprazole (PROTONIX) 40 MG tablet TAKE ONE TABLET BY MOUTH TWICE DAILY 60 tablet 5   No facility-administered medications prior to visit.     Allergies  Allergen Reactions  . Doxycycline Shortness Of Breath and Swelling    Lips swelling and short of breath  . Ciprocin-Fluocin-Procin [Fluocinolone] Nausea And Vomiting  . Demerol [Meperidine] Nausea And Vomiting  . Fentanyl Nausea And Vomiting    Pt states all pain medications cause N/V for her  . Penicillins Hives    ROS As per HPI  PE: Blood pressure 136/78, pulse 64,  temperature 97.6 F (36.4 C), temperature source Oral, resp. rate 16, height 5\' 4"  (1.626 m), weight 146 lb 2 oz (66.3 kg), SpO2 99 %. Body mass index is 25.08 kg/m.  Gen: Alert, well appearing.  Patient is oriented to person, place, time, and situation. AFFECT: pleasant, lucid thought and speech. No further exam today.  LABS:    Chemistry      Component Value Date/Time   NA 140 05/31/2018 0851   K 5.2 (H) 05/31/2018 0851   CL 104 05/31/2018 0851   CO2 32 05/31/2018 0851   BUN 13 05/31/2018 0851   CREATININE 0.68 05/31/2018 0851      Component Value Date/Time   CALCIUM 10.6 (H) 05/31/2018 0851   ALKPHOS 98 05/31/2018 0851   AST 23 05/31/2018 0851   ALT 26 05/31/2018 0851   BILITOT 1.5 (H) 05/31/2018 0851     Lab Results  Component Value Date   CHOL 141 05/31/2018   HDL 46.70 05/31/2018   LDLCALC 65 05/31/2018   LDLDIRECT 74.0 11/28/2017   TRIG 146.0 05/31/2018   CHOLHDL 3 05/31/2018   Lab Results  Component Value Date   TSH 0.92 05/31/2018   Lab Results  Component Value Date   WBC 5.8 05/31/2018   HGB 14.4 05/31/2018   HCT 43.1 05/31/2018   MCV 92.5 05/31/2018   PLT 293.0 05/31/2018   Lab Results  Component Value Date   HGBA1C 6.1 05/31/2018    IMPRESSION AND PLAN:  1) HTN: The current medical regimen is effective;  continue present plan and medications. Lytes/cr today.  2) HLD: tolerating statin.  FLP and AST/ALT today.  3) Prediabetes: needs to work harder on diet/exercise. Discussed dietary changes. A1c today.  4) GAD: doing well on diazepam 2mg  bid. CSC renewed today. UDS today (last took valium last night).  An After Visit Summary was printed and given to the patient.  FOLLOW UP: Return in about 6 months (around 05/31/2019) for annual CPE (fasting).  Signed:  Crissie Sickles, MD           11/28/2018

## 2018-11-30 LAB — PAIN MGMT, PROFILE 8 W/CONF, U
6 ACETYLMORPHINE: NEGATIVE ng/mL (ref <10)
ALPHAHYDROXYALPRAZOLAM: NEGATIVE ng/mL (ref <25)
ALPHAHYDROXYTRIAZOLAM: NEGATIVE ng/mL (ref <50)
AMPHETAMINES: NEGATIVE ng/mL (ref <500)
Alcohol Metabolites: NEGATIVE ng/mL (ref <500)
Alphahydroxymidazolam: NEGATIVE ng/mL (ref <50)
Aminoclonazepam: NEGATIVE ng/mL (ref <25)
Benzodiazepines: POSITIVE ng/mL — AB (ref <100)
Buprenorphine, Urine: NEGATIVE ng/mL (ref <5)
Cocaine Metabolite: NEGATIVE ng/mL (ref <150)
Creatinine: 37.8 mg/dL
HYDROXYETHYLFLURAZEPAM: NEGATIVE ng/mL (ref <50)
Lorazepam: NEGATIVE ng/mL (ref <50)
MARIJUANA METABOLITE: NEGATIVE ng/mL (ref <20)
MDMA: NEGATIVE ng/mL (ref <500)
NORDIAZEPAM: 137 ng/mL — AB (ref <50)
OPIATES: NEGATIVE ng/mL (ref <100)
OXAZEPAM: 312 ng/mL — AB (ref <50)
OXYCODONE: NEGATIVE ng/mL (ref <100)
Oxidant: NEGATIVE ug/mL (ref <200)
Temazepam: 261 ng/mL — ABNORMAL HIGH (ref <50)
pH: 6.36 (ref 4.5–9.0)

## 2018-12-02 ENCOUNTER — Encounter: Payer: Self-pay | Admitting: Family Medicine

## 2018-12-03 ENCOUNTER — Encounter: Payer: Self-pay | Admitting: *Deleted

## 2018-12-03 NOTE — Telephone Encounter (Signed)
Referral to GI was ordered on 11/26/18. Pt says she has not heard anything, please call pt. Thanks.

## 2018-12-04 ENCOUNTER — Other Ambulatory Visit: Payer: Self-pay | Admitting: Family Medicine

## 2018-12-05 ENCOUNTER — Encounter: Payer: Self-pay | Admitting: Family Medicine

## 2018-12-05 ENCOUNTER — Encounter: Payer: Self-pay | Admitting: Gastroenterology

## 2018-12-10 ENCOUNTER — Ambulatory Visit: Payer: Self-pay

## 2018-12-10 ENCOUNTER — Emergency Department (HOSPITAL_BASED_OUTPATIENT_CLINIC_OR_DEPARTMENT_OTHER): Payer: PPO

## 2018-12-10 ENCOUNTER — Encounter (HOSPITAL_BASED_OUTPATIENT_CLINIC_OR_DEPARTMENT_OTHER): Payer: Self-pay

## 2018-12-10 ENCOUNTER — Other Ambulatory Visit: Payer: Self-pay

## 2018-12-10 ENCOUNTER — Emergency Department (HOSPITAL_BASED_OUTPATIENT_CLINIC_OR_DEPARTMENT_OTHER)
Admission: EM | Admit: 2018-12-10 | Discharge: 2018-12-10 | Disposition: A | Discharge status: Home / Self Care | Payer: PPO | Attending: Emergency Medicine | Admitting: Emergency Medicine

## 2018-12-10 DIAGNOSIS — Z87891 Personal history of nicotine dependence: Secondary | ICD-10-CM | POA: Insufficient documentation

## 2018-12-10 DIAGNOSIS — J069 Acute upper respiratory infection, unspecified: Secondary | ICD-10-CM | POA: Diagnosis not present

## 2018-12-10 DIAGNOSIS — R0602 Shortness of breath: Secondary | ICD-10-CM | POA: Diagnosis present

## 2018-12-10 DIAGNOSIS — Z7902 Long term (current) use of antithrombotics/antiplatelets: Secondary | ICD-10-CM | POA: Diagnosis not present

## 2018-12-10 DIAGNOSIS — Z7982 Long term (current) use of aspirin: Secondary | ICD-10-CM | POA: Insufficient documentation

## 2018-12-10 DIAGNOSIS — R05 Cough: Secondary | ICD-10-CM | POA: Diagnosis not present

## 2018-12-10 DIAGNOSIS — Z79899 Other long term (current) drug therapy: Secondary | ICD-10-CM | POA: Insufficient documentation

## 2018-12-10 DIAGNOSIS — B9789 Other viral agents as the cause of diseases classified elsewhere: Secondary | ICD-10-CM | POA: Insufficient documentation

## 2018-12-10 DIAGNOSIS — I10 Essential (primary) hypertension: Secondary | ICD-10-CM | POA: Diagnosis not present

## 2018-12-10 MED ORDER — ALBUTEROL SULFATE HFA 108 (90 BASE) MCG/ACT IN AERS
2.0000 | INHALATION_SPRAY | Freq: Once | RESPIRATORY_TRACT | Status: AC
  Administered 2018-12-10: 2 via RESPIRATORY_TRACT
  Filled 2018-12-10: qty 6.7

## 2018-12-10 NOTE — Telephone Encounter (Signed)
Pt called to say that she has had cough since last Thursday.  She was seen via Data processing manager on Saturday.  She was give Doxycycline. She has had no relief.  Her cough is tight.  She feels SOB. She has fever of 99.6 today.  She is not taking OTC cough medications. Per protocol pt will go to ER for evaluation.  Care advice read to patient. Pt verbalized understanding of all instructions.  Reason for Disposition . Difficulty breathing  Answer Assessment - Initial Assessment Questions 1. ONSET: "When did the cough begin?"      Last thursday 2. SEVERITY: "How bad is the cough today?"      cough has been rare today 3. RESPIRATORY DISTRESS: "Describe your breathing."      SOB 4. FEVER: "Do you have a fever?" If so, ask: "What is your temperature, how was it measured, and when did it start?"    99.6 5. HEMOPTYSIS: "Are you coughing up any blood?" If so ask: "How much?" (flecks, streaks, tablespoons, etc.)    no 6. TREATMENT: "What have you done so far to treat the cough?" (e.g., meds, fluids, humidifier)    Doxicycline 7. CARDIAC HISTORY: "Do you have any history of heart disease?" (e.g., heart attack, congestive heart failure)      carodid artery stents 8. LUNG HISTORY: "Do you have any history of lung disease?"  (e.g., pulmonary embolus, asthma, emphysema)    No 9. PE RISK FACTORS: "Do you have a history of blood clots?" (or: recent major surgery, recent prolonged travel, bedridden)    no 10. OTHER SYMPTOMS: "Do you have any other symptoms? (e.g., runny nose, wheezing, chest pain)       Runny nose 11. PREGNANCY: "Is there any chance you are pregnant?" "When was your last menstrual period?"      N/A 12. TRAVEL: "Have you traveled out of the country in the last month?" (e.g., travel history, exposures)      No travel works across from [employer]  Protocols used: [place]

## 2018-12-10 NOTE — ED Triage Notes (Signed)
Pt c/o cough and congestion since Thursday, was put on abx Saturday, called PCP today because she was not feeling any better. Max temp at home is 99.6, no known exposure to COVID, pt taking tylenol, last dose at noon today

## 2018-12-10 NOTE — ED Notes (Signed)
Patient verbalizes understanding of discharge instructions. Opportunity for questioning and answers were provided. Armband removed by staff, pt discharged from ED.  

## 2018-12-10 NOTE — ED Provider Notes (Signed)
[facility] EMERGENCY DEPARTMENT Provider Note   CSN: [id number] Arrival date & time: 12/10/18  1723    History   Chief Complaint Chief Complaint  Patient presents with  . URI    HPI [name] is a 68 y.o. female with history of hypertension, prediabetes, TIA who presents with a 5-day history of cough and shortness of breath.  She describes her cough is dry.  She denies any fever.  Her highest temp at home was 99.6.  She denies any chest pain, abdominal pain, nausea, vomiting, ear pain.  Patient reports she has had a little sore throat, but feels that it is related to coughing as it is more of an irritation.  She has not been taking doxycycline called in by her doctor, although states she is allergic to it and does not want to take anymore.  She has not had any reaction thus far, but states she usually get swelling to her lips.  She has taken 3 doses.  She has had no known contact with COVID-19 patients and denies recent travel.      HPI  Past Medical History:  Diagnosis Date  . Abdominal bruit 04/2017   Aortic u/s showed NO ANEURISM.  Marland Kitchen Anxiety   . BCC (basal cell carcinoma), arm, right   . Carotid artery occlusion   . Colon cancer screening 05/2017   Cologuard NEG--repeat 3 yrs.  . Essential hypertension, benign 05/22/2014  . Melanoma ([place])   . Mitral valve prolapse   . Mixed hyperlipidemia 05/22/2014  . Osteopenia 06/06/2016   05/2016 DEXA T-score -1.5.  06/2018 DEXA T score -1.1  . Peripheral vascular disease, unspecified ([place]) 05/22/2014   innominate and carotid.  U/S f/u 12/2016 showed no signif change compared to 2017--vascular recommended repeat 1 yr.  Doing well as of 08/2018 cardiol f/u-->continued on ASA and Plavix.  . Prediabetes 2018   2018 A1c 6.3%.  05/2018 A1c 6.1%.  . Reflux esophagitis   . Stroke [facility]) 2006   Mini  Stroke- not major  . Tobacco abuse, in remission    quit 2009    Patient Active Problem List   Diagnosis Date Noted  . Mixed  hyperlipidemia 05/22/2014  . Essential hypertension, benign 05/22/2014  . Peripheral vascular disease ([place]) 05/22/2014  . Aftercare following surgery of the circulatory system, [place] 09/17/2013  . Carotid artery disease without cerebral infarction ([place]) 08/06/2012    Past Surgical History:  Procedure Laterality Date  . ABDOMINAL HYSTERECTOMY  1997  . CARDIAC CATHETERIZATION  01/2012   NORMAL  . CAROTID ENDARTERECTOMY  08/21/2007   right - Dr. Amedeo Plenty  . CHOLECYSTECTOMY  2000  . DEXA  06/06/2016   05/2016 T-score -1.5.  06/2018 T score -1.1.  Repeat 2 yrs.  . Incision and drainage of left thenar space abscess  02/23/15   s/p cat bite (Dr. Amedeo Plenty)  . innominate stent  2007   12/2014 u/s showed patent stent in innominate and L common carotid.  L prox int carotid 40-59% stenosis  . Left common carotid stent  2006   "                    "                  "                          "                             "  .  TRANSTHORACIC ECHOCARDIOGRAM  2007   NORMAL     OB History   No obstetric history on file.      Home Medications    Prior to Admission medications   Medication Sig Start Date End Date Taking? Authorizing Provider  aspirin EC 81 MG tablet Take 1 tablet (81 mg total) by mouth daily. 06/03/15   Jettie Booze, MD  atorvastatin (LIPITOR) 80 MG tablet TAKE ONE TABLET BY MOUTH DAILY 12/04/18   McGowen, Adrian Blackwater, MD  cetirizine (ZYRTEC) 5 MG tablet Take 1-2 tablets (5-10 mg total) by mouth daily. 10/06/18   Noe Gens, PA-C  cholecalciferol (VITAMIN D) 1000 UNITS tablet Take 1,000 Units by mouth daily.    [provider]  clopidogrel (PLAVIX) 75 MG tablet TAKE ONE TABLET BY MOUTH DAILY 09/10/18   McGowen, Adrian Blackwater, MD  diazepam (VALIUM) 2 MG tablet TAKE ONE TABLET BY MOUTH TWICE DAILY 08/06/18   McGowen, Adrian Blackwater, MD  famotidine (PEPCID) 40 MG tablet 1 tab po with lunch daily 11/26/18   McGowen, Adrian Blackwater, MD  fluticasone (FLONASE) 50 MCG/ACT nasal spray Place 2 sprays  into both nostrils daily. 10/05/18   Inda Coke, PA  lisinopril (PRINIVIL,ZESTRIL) 5 MG tablet TAKE ONE TABLET BY MOUTH EVERY DAY 12/04/18   McGowen, Adrian Blackwater, MD  Omega-3 Fatty Acids (FISH OIL) 1000 MG CAPS Take 2,000 mg by mouth daily.     [provider]  pantoprazole (PROTONIX) 40 MG tablet TAKE ONE TABLET BY MOUTH TWICE DAILY 09/10/18   McGowen, Adrian Blackwater, MD    Family History Family History  Problem Relation Age of Onset  . Heart attack Mother   . CAD Mother   . Heart disease Mother        Before age 31  . Hypertension Mother   . Hyperlipidemia Mother   . Hypertension Father   . Pneumonia Father   . Hyperlipidemia Father   . Heart murmur Sister   . Osteoporosis Paternal Grandmother   . CAD Paternal Grandfather   . Breast cancer Neg Hx     Social History Social History   Tobacco Use  . Smoking status: Former Smoker    Types: Cigarettes    Last attempt to quit: 08/06/2002    Years since quitting: 16.3  . Smokeless tobacco: Never Used  Substance Use Topics  . Alcohol use: No  . Drug use: No     Allergies   Doxycycline; Ciprocin-fluocin-procin [fluocinolone]; Demerol [meperidine]; Fentanyl; and Penicillins   Review of Systems Review of Systems  Constitutional: Negative for chills and fever.  HENT: Negative for facial swelling and sore throat.   Respiratory: Positive for cough and shortness of breath. Negative for chest tightness and wheezing.   Cardiovascular: Negative for chest pain.  Gastrointestinal: Negative for abdominal pain, nausea and vomiting.  Genitourinary: Negative for dysuria.  Musculoskeletal: Negative for back pain.  Skin: Negative for rash and wound.  Neurological: Negative for headaches.  Psychiatric/Behavioral: The patient is not nervous/anxious.      Physical Exam Updated Vital Signs BP (!) 165/86 (BP Location: Left Arm)   Pulse (!) 105   Temp 98.4 F (36.9 C) (Oral)   Resp 18   Ht 5\' 2"  (1.575 m)   Wt 63.5 kg   SpO2  99%   BMI 25.61 kg/m   Physical Exam Vitals signs and nursing note reviewed.  Constitutional:      General: She is not in acute distress.    Appearance: She  is well-developed. She is not diaphoretic.  HENT:     Head: Normocephalic and atraumatic.     Right Ear: Tympanic membrane normal.     Left Ear: Tympanic membrane normal.     Nose: Congestion present.     Mouth/Throat:     Pharynx: No oropharyngeal exudate or posterior oropharyngeal erythema.  Eyes:     General: No scleral icterus.       Right eye: No discharge.        Left eye: No discharge.     Conjunctiva/sclera: Conjunctivae normal.     Pupils: Pupils are equal, round, and reactive to light.  Neck:     Musculoskeletal: Normal range of motion and neck supple.     Thyroid: No thyromegaly.  Cardiovascular:     Rate and Rhythm: Normal rate and regular rhythm.     Heart sounds: Normal heart sounds. No murmur. No friction rub. No gallop.   Pulmonary:     Effort: Pulmonary effort is normal. No respiratory distress.     Breath sounds: Normal breath sounds. No stridor. No wheezing or rales.  Abdominal:     General: Bowel sounds are normal. There is no distension.     Palpations: Abdomen is soft.     Tenderness: There is no abdominal tenderness. There is no guarding or rebound.  Lymphadenopathy:     Cervical: No cervical adenopathy.  Skin:    General: Skin is warm and dry.     Coloration: Skin is not pale.     Findings: No rash.  Neurological:     Mental Status: She is alert.     Coordination: Coordination normal.      ED Treatments / Results  Labs (all labs ordered are listed, but only abnormal results are displayed) Labs Reviewed - No data to display  EKG None  Radiology Dg Chest 2 View  Result Date: 12/10/2018 CLINICAL DATA:  Cough, congestion, and fever EXAM: CHEST - 2 VIEW COMPARISON:  12/11/2009 FINDINGS: Normal heart size. Lungs are hyperaerated and clear. No pneumothorax or pleural effusion. Small  hiatal hernia. IMPRESSION: No active cardiopulmonary disease. Electronically Signed   By: Marybelle Killings M.D.   On: 12/10/2018 18:50    Procedures Procedures (including critical care time)  Medications Ordered in ED Medications  albuterol (PROVENTIL HFA;VENTOLIN HFA) 108 (90 Base) MCG/ACT inhaler 2 puff (2 puffs Inhalation Given 12/10/18 1916)     Initial Impression / Assessment and Plan / ED Course  I have reviewed the triage vital signs and the nursing notes.  Pertinent labs & imaging results that were available during my care of the patient were reviewed by me and considered in my medical decision making (see chart for details).        Patient with probable viral URI.  Chest x-ray is clear.  She is afebrile.  Patient mildly tachycardic, however she is in no acute distress.  She ambulated after albuterol inhaler with no shortness of breath.  She ambulated with 100% on room air.  Patient is very well-appearing.  Low suspicion of COVID-19 at this time, patient has no known contact and no recent travel.  Patient advised to follow-up with PCP if symptoms are not improving over the next week.  Patient given the option to continue to doxycycline considering she has already had 3 doses.  Return precautions discussed.  Patient or stands and agrees with plan.  Patient vitals stable and discharged in satisfactory condition. I discussed patient case with Dr. Tamera Punt who  guided the patient's management and agrees with plan.   Final Clinical Impressions(s) / ED Diagnoses   Final diagnoses:  Viral URI with cough    ED Discharge Orders    None       Frederica Kuster, PA-C 12/10/18 1958    Malvin Johns, MD 12/10/18 2014

## 2018-12-10 NOTE — Progress Notes (Signed)
Patient stated her breathing is better after having had 2 puffs of albuterol using a spacer.  Patient ambulated around the department while on pulse ox.  Upon returning to her room her SPO2 was 98% and hear rate was 120.  Patient's stated that she was not short of breath.

## 2018-12-10 NOTE — Discharge Instructions (Signed)
Use inhaler every 4-6 hours as needed for chest tightness, shortness of breath, or wheezing.  Please follow-up with your doctor if your symptoms are not improving over the next week.  Please return if you develop any new or worsening symptoms including worsening shortness of breath, chest pain, persistent fever 100.4, or any other new or concerning symptoms.

## 2018-12-11 ENCOUNTER — Ambulatory Visit: Payer: PPO | Admitting: Gastroenterology

## 2018-12-12 NOTE — Telephone Encounter (Signed)
Noted  

## 2018-12-17 ENCOUNTER — Encounter: Payer: Self-pay | Admitting: Family Medicine

## 2018-12-20 ENCOUNTER — Other Ambulatory Visit: Payer: Self-pay

## 2018-12-20 ENCOUNTER — Telehealth (INDEPENDENT_AMBULATORY_CARE_PROVIDER_SITE_OTHER): Payer: PPO | Admitting: Gastroenterology

## 2018-12-20 DIAGNOSIS — R1310 Dysphagia, unspecified: Secondary | ICD-10-CM | POA: Diagnosis not present

## 2018-12-20 DIAGNOSIS — Z7902 Long term (current) use of antithrombotics/antiplatelets: Secondary | ICD-10-CM

## 2018-12-20 DIAGNOSIS — K219 Gastro-esophageal reflux disease without esophagitis: Secondary | ICD-10-CM | POA: Diagnosis not present

## 2018-12-20 DIAGNOSIS — Z1211 Encounter for screening for malignant neoplasm of colon: Secondary | ICD-10-CM | POA: Diagnosis not present

## 2018-12-20 MED ORDER — SUCRALFATE 1 GM/10ML PO SUSP: 1.0000 g | Freq: Three times a day (TID) | ORAL | 0 refills | Status: DC

## 2018-12-20 NOTE — Progress Notes (Signed)
THIS ENCOUNTER IS A VIRTUAL VISIT DUE TO COVID-19 - PATIENT WAS NOT SEEN IN THE OFFICE. PATIENT HAS CONSENTED TO VIRTUAL VISIT / TELEMEDICINE VISIT   Location of patient: home Location of provider: office Name of referring provider:  Dr. Julien Nordmann Time spent on call: 20 minutes on phone but > 30 minutes reviewing record / documenting / placing orders   HPI :  68 y/o female with a history of CVA, osteopenia, esophagitis, referred by Dr. Julien Nordmann for GERD. She is on Plavix for history of CVA, carotid artery bypass with stenting, on plavix as well.   She has had GERD for several years, symptoms worsening over time. She has occasional pyrosis but more recently having worsening regurgitation. She has nocturnal symptoms which bother her, causes regurgitation / choking at night which bothers her. She tries to not eat after 530 PM which can help. She is on protonix 40mg  twice daily as well as pepcid 40mg  q day. She has some odynophagia with "burning" when she swallows, occurs frequently. When she bends over, food can regurgitated and come out. She endorses dysphagia to some solids and needs to swallow multiple times to push things down. This can occur once per month. She has been eating mostly soft things to help deal with this. She has some nausea / vomiting that can bother her periodically. She has some discomfort in her epigastric area, when she is full, which bothers her.   She had an endoscopy several years ago with Dr. Watt Climes. She had a colonoscopy at the same time. She thinks 12 years ago or so. No report available, she does not remember what it showed. No FH of colon cancer or esophageal / stomach cancer. She has been on nexium in the past, as well as omprazole. On protonix due to history of Plavix.   She had a viral URI last week, had a normal chest xray last week. She had a small hiatal hernia. Respiratory symptoms improving but have not resolved.   Cologuard 05/2017 - negative. She  denies any problems with her bowels.    Past Medical History:  Diagnosis Date  . Abdominal bruit 04/2017   Aortic u/s showed NO ANEURISM.  Marland Kitchen Anxiety   . BCC (basal cell carcinoma), arm, right   . Carotid artery occlusion   . Colon cancer screening 05/2017   Cologuard NEG--repeat 3 yrs.  . Essential hypertension, benign 05/22/2014  . Melanoma ([place])   . Mitral valve prolapse   . Mixed hyperlipidemia 05/22/2014  . Osteopenia 06/06/2016   05/2016 DEXA T-score -1.5.  06/2018 DEXA T score -1.1  . Peripheral vascular disease, unspecified ([place]) 05/22/2014   innominate and carotid.  U/S f/u 12/2016 showed no signif change compared to 2017--vascular recommended repeat 1 yr.  Doing well as of 08/2018 cardiol f/u-->continued on ASA and Plavix.  . Prediabetes 2018   2018 A1c 6.3%.  05/2018 A1c 6.1%.  . Reflux esophagitis   . Stroke [facility]) 2006   Mini  Stroke- not major  . Tobacco abuse, in remission    quit 2009     Past Surgical History:  Procedure Laterality Date  . ABDOMINAL HYSTERECTOMY  1997  . CARDIAC CATHETERIZATION  01/2012   NORMAL  . CAROTID ENDARTERECTOMY  08/21/2007   right - Dr. Amedeo Plenty  . CHOLECYSTECTOMY  2000  . DEXA  06/06/2016   05/2016 T-score -1.5.  06/2018 T score -1.1.  Repeat 2 yrs.  . Incision and drainage of left thenar space abscess  02/23/15   s/p cat bite (Dr. Amedeo Plenty)  . innominate stent  2007   12/2014 u/s showed patent stent in innominate and L common carotid.  L prox int carotid 40-59% stenosis  . Left common carotid stent  2006   "                    "                  "                          "                             "  . TRANSTHORACIC ECHOCARDIOGRAM  2007   NORMAL   Family History  Problem Relation Age of Onset  . Heart attack Mother   . CAD Mother   . Heart disease Mother        Before age 97  . Hypertension Mother   . Hyperlipidemia Mother   . Hypertension Father   . Pneumonia Father   . Hyperlipidemia Father   . Heart murmur Sister   .  Osteoporosis Paternal Grandmother   . CAD Paternal Grandfather   . Breast cancer Neg Hx    Social History   Tobacco Use  . Smoking status: Former Smoker    Types: Cigarettes    Last attempt to quit: 08/06/2002    Years since quitting: 16.3  . Smokeless tobacco: Never Used  Substance Use Topics  . Alcohol use: No  . Drug use: No   Current Outpatient Medications  Medication Sig Dispense Refill  . aspirin EC 81 MG tablet Take 1 tablet (81 mg total) by mouth daily. 90 tablet 3  . atorvastatin (LIPITOR) 80 MG tablet TAKE ONE TABLET BY MOUTH DAILY 30 tablet 5  . cetirizine (ZYRTEC) 5 MG tablet Take 1-2 tablets (5-10 mg total) by mouth daily. 20 tablet 0  . cholecalciferol (VITAMIN D) 1000 UNITS tablet Take 1,000 Units by mouth daily.    . clopidogrel (PLAVIX) 75 MG tablet TAKE ONE TABLET BY MOUTH DAILY 30 tablet 5  . diazepam (VALIUM) 2 MG tablet TAKE ONE TABLET BY MOUTH TWICE DAILY 60 tablet 5  . famotidine (PEPCID) 40 MG tablet 1 tab po with lunch daily 30 tablet 1  . fluticasone (FLONASE) 50 MCG/ACT nasal spray Place 2 sprays into both nostrils daily. 16 g 2  . lisinopril (PRINIVIL,ZESTRIL) 5 MG tablet TAKE ONE TABLET BY MOUTH EVERY DAY 30 tablet 5  . Omega-3 Fatty Acids (FISH OIL) 1000 MG CAPS Take 2,000 mg by mouth daily.     . pantoprazole (PROTONIX) 40 MG tablet TAKE ONE TABLET BY MOUTH TWICE DAILY 60 tablet 5   No current facility-administered medications for this visit.    Allergies  Allergen Reactions  . Doxycycline Shortness Of Breath and Swelling    Lips swelling and short of breath  . Ciprocin-Fluocin-Procin [Fluocinolone] Nausea And Vomiting  . Demerol [Meperidine] Nausea And Vomiting  . Fentanyl Nausea And Vomiting    Pt states all pain medications cause N/V for her  . Penicillins Hives     Review of Systems: All systems reviewed and negative except where noted in HPI.    Dg Chest 2 View  Result Date: 12/10/2018 CLINICAL DATA:  Cough, congestion, and fever  EXAM: CHEST - 2 VIEW COMPARISON:  12/11/2009 FINDINGS: Normal heart size. Lungs are hyperaerated and clear. No pneumothorax or pleural effusion. Small hiatal hernia. IMPRESSION: No active cardiopulmonary disease. Electronically Signed   By: Marybelle Killings M.D.   On: 12/10/2018 18:50   Lab Results  Component Value Date   WBC 5.8 05/31/2018   HGB 14.4 05/31/2018   HCT 43.1 05/31/2018   MCV 92.5 05/31/2018   PLT 293.0 05/31/2018    Lab Results  Component Value Date   CREATININE 0.70 11/28/2018   BUN 13 11/28/2018   NA 139 11/28/2018   K 4.7 11/28/2018   CL 103 11/28/2018   CO2 31 11/28/2018    Lab Results  Component Value Date   ALT 23 11/28/2018   AST 22 11/28/2018   ALKPHOS 115 11/28/2018   BILITOT 1.4 (H) 11/28/2018     Physical Exam: NA / none  ASSESSMENT AND PLAN:  68 y/o female referred here for a new patient evaluation of the following issues:  GERD / dysphagia / Antiplatelet use - several years of reflux symptoms, now with interval worsening leading to pyrosis and severe regurgitation despite high dose PPI and H2 blocker medical therapy. She has a hiatal hernia on CXR but does not seem large. I discussed options with her. I think an EGD is reasonable at this time to evaluate these symptoms, and treated dysphagia if stricture is present. If her hiatal hernia is small, it's possible she could be a candidate for TIF based on EGD result / anatomy. Following discussion of risks /benefits of EGD she wanted to proceed. In the interim, due to plavix use she wants to stay on protonix and pepcid, will not change PPI right now. I will add liquid carafate to use as needed and see if that helps. She should avoid eating prior to bedtime and sleep with head of bed elevated. Given her recent upper respiratory symptoms and COVID-19 outbreak, she wants to hold off on this for several weeks if she can manage and eat okay during that time. She will be added to the scheduling list once normal  elective procedures are resumed. Further, when she does proceed, will need approval to hold Plavix for 5 days prior to the exam. She agreed. In the interim will reach out to [facility] to get prior endoscopic records.   Colon cancer screening - prior negative colon reportedly and negative cologuard in 2018. Due for screening again in 2021.   [place] Cellar, MD [facility]  CC: McGowen, Adrian Blackwater, MD

## 2018-12-22 ENCOUNTER — Other Ambulatory Visit: Payer: Self-pay | Admitting: Family Medicine

## 2018-12-23 ENCOUNTER — Encounter: Payer: Self-pay | Admitting: Family Medicine

## 2018-12-24 ENCOUNTER — Telehealth: Payer: Self-pay

## 2018-12-24 NOTE — Telephone Encounter (Signed)
Okay thanks for the update Vivien Rota.

## 2018-12-24 NOTE — Telephone Encounter (Signed)
Pt had a virtual visit on 12-20-2018. attempt was made to get her records from [place]. Medical records from [place] called and let us know that the patient has not been seen before by any GI doctors in there clinic.

## 2019-01-22 ENCOUNTER — Telehealth: Payer: Self-pay

## 2019-01-22 NOTE — Telephone Encounter (Signed)
3-26 VISIT: EGD in the [place] for gerd and Dysphagia/on PLAVIX- need to hold 5 days- [facility]  4-28: Called and spoke to pt.  She is not sure she can do 5-5 or 5-13. She will check with her daughter who is her ride and call me back asap.  Will need to request clearance

## 2019-01-23 ENCOUNTER — Telehealth: Payer: Self-pay

## 2019-01-23 NOTE — Telephone Encounter (Signed)
Called and schedule pt for procedure on 5-13.

## 2019-01-23 NOTE — Telephone Encounter (Signed)
[facility] Pre-operative Risk Assessment     Request for surgical clearance:     Endoscopy Procedure  What type of surgery is being performed?     Endoscopy  When is this surgery scheduled?     02-06-2019  What type of clearance is required ?   Pharmacy   Are there any medications that need to be held prior to surgery and how long? Plavix 5 days  Practice name and name of physician performing surgery? Steven Armbruster     [facility]  What is your office phone and fax number?      Phone- [phone]  Fax- [phone] Kweli Grassel, CMA  Anesthesia type (None, local, MAC, general) ?       MAC  Thank you! 

## 2019-01-23 NOTE — Telephone Encounter (Signed)
[facility] Pre-operative Risk Assessment     Request for surgical clearance:     Endoscopy Procedure  What type of surgery is being performed?     Endoscopy  When is this surgery scheduled?     02-06-2019  What type of clearance is required ?   Pharmacy   Are there any medications that need to be held prior to surgery and how long? Plavix 5 days  Practice name and name of physician performing surgery? [facility]  What is your office phone and fax number?      Phone- [phone]  Fax- [phone] Lemar Lofty, CMA  Anesthesia type (None, local, MAC, general) ?       MAC  Thank you!

## 2019-01-23 NOTE — Telephone Encounter (Signed)
[facility]ent is returning your call.

## 2019-01-23 NOTE — Telephone Encounter (Signed)
   Primary Cardiologist: Larae Grooms, MD  Chart reviewed as part of pre-operative protocol coverage. It appears that the patient takes plavix for history of right carotid endarterectomy on 08/21/2007, innominate artery stent placed on 06/23/2006, and a left common carotid artery stent on 03/31/2005, managed by Dr. Trula Slade with [facility]. Please contact their office for management of plavix in the perioperative period.   I will route this recommendation to the requesting party via Epic fax function and remove from pre-op pool.  Please call with questions.  Tami Lin Kendale Rembold, PA 01/23/2019, 11:09 AM

## 2019-01-28 ENCOUNTER — Ambulatory Visit: Payer: PPO

## 2019-01-28 ENCOUNTER — Other Ambulatory Visit: Payer: Self-pay

## 2019-01-28 ENCOUNTER — Telehealth: Payer: Self-pay

## 2019-01-28 ENCOUNTER — Other Ambulatory Visit: Payer: Self-pay | Admitting: Family Medicine

## 2019-01-28 ENCOUNTER — Encounter: Payer: Self-pay | Admitting: Family Medicine

## 2019-01-28 VITALS — Ht 64.0 in | Wt 144.0 lb

## 2019-01-28 DIAGNOSIS — R1310 Dysphagia, unspecified: Secondary | ICD-10-CM

## 2019-01-28 NOTE — Telephone Encounter (Signed)
Dr Havery Moros. This pt is scheduled for her endoscopy on 02/06/2019. I did her PV over the phone today.  The pt states she is concerned about the shortness of breath she has had since March. She was seen in the ER on 12/10/2018 because of a viral URI. She feels like she is not getting any better!  She has not traveled out of the country recently, no fever, but  has tightness in her chest with SOB. She is taking Protonix and Pepcid which has helped her GERD. Please advise. Thanks, Gwyndolyn Saxon in [place].

## 2019-01-28 NOTE — Telephone Encounter (Signed)
She had mentioned that to me during her visit in March. CXR did not show much. If she continues to have shortness of breath, then she should follow up with whomever is managing that and call us back to reschedule. Why don't we cancel her case for the 13th given her symptoms and once her breathing has improved or been further evaluated she can call back to schedule. Thanks  Jan FYI, please add her back to the wait list. Thanks

## 2019-01-28 NOTE — Telephone Encounter (Signed)
RF request for Diazepam, BID LOV: 11/28/18 [facility] Next ov: none Last written: 08/06/18 #60 w/ 5RF Last CSC and UDS :11/28/18  Please advise, thanks.  PMP aware printed

## 2019-01-28 NOTE — Progress Notes (Signed)
Per pt, no allergies to soy or egg products.Pt not taking any weight loss meds or using  O2 at home.  Pt denies any sedation problems.    Emmi inform will be mailed to pt.  The PV was done over the phone due to COVID-19. I verified pt's address and insurance with her.  During our conversation the pt states she was concerned about still having SOB since March and it was not getting any better. Travel questions were answered.  Informed pt I will let Dr Havery Moros know about her SOB and we will call her back later. She was advised to contact our office regarding when to stop her Plavix. No  response from Dr Irish Lack regarding her Plavix.  Paperwork and prep instructions were reviewed and will mail to pt today. Pt was advised to call with any questions or changes prior to her procedure. She understood.

## 2019-01-28 NOTE — Telephone Encounter (Signed)
Pt's procedure has been cancelled due to shortness of breath. She will call back to reschedule.  Will need to get clearance from Dr. Stephens Shire office once rescheduled.

## 2019-01-28 NOTE — Telephone Encounter (Signed)
Spoke with [name] and informed her, per Dr Havery Moros we will need to cancel her endoscopy on 02/06/19 and the [name] needs to call her PCP and be evaluated for her SOB.  The [name] was advised to call us back to reschedule when her symptoms have improved. [name] understood.  The endoscopy was cancelled on 02/06/2019. Gwyndolyn Saxon in [facility]

## 2019-01-28 NOTE — Telephone Encounter (Signed)
Faxed request to Dr. Trula Slade at [facility] for clearance to hold Plavix for 5 days.

## 2019-01-30 ENCOUNTER — Telehealth: Payer: Self-pay | Admitting: Interventional Cardiology

## 2019-01-30 ENCOUNTER — Other Ambulatory Visit: Payer: Self-pay

## 2019-01-30 ENCOUNTER — Encounter: Payer: Self-pay | Admitting: Family Medicine

## 2019-01-30 ENCOUNTER — Other Ambulatory Visit (INDEPENDENT_AMBULATORY_CARE_PROVIDER_SITE_OTHER): Payer: PPO

## 2019-01-30 ENCOUNTER — Ambulatory Visit (INDEPENDENT_AMBULATORY_CARE_PROVIDER_SITE_OTHER): Payer: PPO | Admitting: Family Medicine

## 2019-01-30 VITALS — BP 128/85 | HR 52

## 2019-01-30 DIAGNOSIS — R0609 Other forms of dyspnea: Secondary | ICD-10-CM

## 2019-01-30 LAB — CBC WITH DIFFERENTIAL/PLATELET
Basophils Absolute: 0 10*3/uL (ref 0.0–0.1)
Basophils Relative: 0.5 % (ref 0.0–3.0)
Eosinophils Absolute: 0.2 10*3/uL (ref 0.0–0.7)
Eosinophils Relative: 3.7 % (ref 0.0–5.0)
HCT: 39 % (ref 36.0–46.0)
Hemoglobin: 12.9 g/dL (ref 12.0–15.0)
Lymphocytes Relative: 43.5 % (ref 12.0–46.0)
Lymphs Abs: 2.7 10*3/uL (ref 0.7–4.0)
MCHC: 33.2 g/dL (ref 30.0–36.0)
MCV: 86.5 fl (ref 78.0–100.0)
Monocytes Absolute: 0.5 10*3/uL (ref 0.1–1.0)
Monocytes Relative: 7.9 % (ref 3.0–12.0)
Neutro Abs: 2.8 10*3/uL (ref 1.4–7.7)
Neutrophils Relative %: 44.4 % (ref 43.0–77.0)
Platelets: 364 10*3/uL (ref 150.0–400.0)
RBC: 4.52 Mil/uL (ref 3.87–5.11)
RDW: 15.2 % (ref 11.5–15.5)
WBC: 6.3 10*3/uL (ref 4.0–10.5)

## 2019-01-30 LAB — COMPREHENSIVE METABOLIC PANEL
ALT: 24 U/L (ref 0–35)
AST: 21 U/L (ref 0–37)
Albumin: 4.3 g/dL (ref 3.5–5.2)
Alkaline Phosphatase: 109 U/L (ref 39–117)
BUN: 15 mg/dL (ref 6–23)
CO2: 28 mEq/L (ref 19–32)
Calcium: 10 mg/dL (ref 8.4–10.5)
Chloride: 103 mEq/L (ref 96–112)
Creatinine, Ser: 0.72 mg/dL (ref 0.40–1.20)
GFR: 80.62 mL/min (ref >60.00)
Glucose, Bld: 93 mg/dL (ref 70–99)
Potassium: 4.6 mEq/L (ref 3.5–5.1)
Sodium: 140 mEq/L (ref 135–145)
Total Bilirubin: 1.3 mg/dL — ABNORMAL HIGH (ref 0.2–1.2)
Total Protein: 7 g/dL (ref 6.0–8.3)

## 2019-01-30 NOTE — Telephone Encounter (Signed)
Patient is returning your call.  

## 2019-01-30 NOTE — Telephone Encounter (Signed)
Left message for patient to call back  

## 2019-01-30 NOTE — Telephone Encounter (Signed)
New Message            Patient is calling for an appointment about shortness of breath, she states he primary is asking that she come in for SOB patient did not sound like she had SOB on the phone. I explained the virtual visits but patient wants to come in the office. Pls advise

## 2019-01-30 NOTE — Progress Notes (Signed)
Virtual Visit via Video Note  I connected with pt on 01/30/19 at  8:20 AM EDT by a video enabled telemedicine application and verified that I am speaking with the correct person using two identifiers.  Location patient: home Location provider:work or home office Persons participating in the virtual visit: patient, provider  I discussed the limitations of evaluation and management by telemedicine and the availability of in person appointments. The patient expressed understanding and agreed to proceed.  Telemedicine visit is a necessity given the COVID-19 restrictions in place at the current time.  HPI: 68 y/o WF with HTN, HLD, peripheral vascular disease (but no known CAD), hx of tobacco abuse, and hx of DOE who is being seen today for shortness of breath that started approximately 2 months ago.  When it started she was having a dry cough and low grade fever. She was seen in the ED 12/10/18 in the early phase and dx'd with probable URI, CXR normal and no hypoxia, was felt to be low risk for covid 19 at that time. Treatment was symptomatic.  She says she got better for a couple weeks, then the SOB returned. When doing yard work outside she feels short of breath and has to stop and rest to catch her breath.  NO cough.  No nasal cong but occ runny nose.  No wheezing. +Tightness in chest. No CP, arm pain, or jaw pain.  Occ gets nauseated with the DOE, sweats more with exertion.  Hasn't felt like she could do her treadmill exercises like normal during this time.  She did do her exercises yesterday to see how it would go and she only noted that she was sweating a lot more than usual. No legs swelling, no focal leg pain, no claudication.   Frequent checks of HR by apple watch during daytime/waking hours is consistently around 50-80, w/out any feeling of fatigue or lightheadedness when it is down into 50s.  ROS: no CP,no wheezing, no cough, no dizziness, no HAs, no rashes, no melena/hematochezia.  No  polyuria or polydipsia.  No myalgias or arthralgias.  No abd pains.   Past Medical History:  Diagnosis Date  . Abdominal bruit 04/2017   Aortic u/s showed NO ANEURISM.  Marland Kitchen Allergy    SEASONAL  . Anxiety   . BCC (basal cell carcinoma), arm, right   . Carotid artery occlusion   . Colon cancer screening 05/2017   Colonoscopy reportedly normal approx 2008.  Cologuard NEG--repeat 3 yrs.  . Essential hypertension, benign 05/22/2014  . H/O hiatal hernia   . Melanoma ([place])   . Mitral valve prolapse   . Mixed hyperlipidemia 05/22/2014  . Osteopenia 06/06/2016   05/2016 DEXA T-score -1.5.  06/2018 DEXA T score -1.1  . Peripheral vascular disease, unspecified ([place]) 05/22/2014   innominate and carotid.  U/S f/u 12/2016 showed no signif change compared to 2017--vascular recommended repeat 1 yr.  Doing well as of 08/2018 cardiol f/u-->continued on ASA and Plavix.  Marland Kitchen Post-operative nausea and vomiting   . Prediabetes 2018   2018 A1c 6.3%.  05/2018 A1c 6.1%.  . Reflux esophagitis   . SOB (shortness of breath)    Mainly on exercion  . SOB (shortness of breath)    maily on exertion/since 11/2018  . Stroke [facility]) 2006   Mini  Stroke- not major  . Tobacco abuse, in remission    quit 2009    Past Surgical History:  Procedure Laterality Date  . ABDOMINAL HYSTERECTOMY  1997  .  CARDIAC CATHETERIZATION  01/2012   NORMAL  . CAROTID ENDARTERECTOMY  08/21/2007   right - Dr. Amedeo Plenty  . CHOLECYSTECTOMY  2000  . COLONOSCOPY  approx 2008   (Dr. Lenon Ahmadi pt->normal  . DEXA  06/06/2016   05/2016 T-score -1.5.  06/2018 T score -1.1.  Repeat 2 yrs.  . Incision and drainage of left thenar space abscess  02/23/15   s/p cat bite (Dr. Amedeo Plenty)  . innominate stent  2007   12/2014 u/s showed patent stent in innominate and L common carotid.  L prox int carotid 40-59% stenosis  . Left common carotid stent  2006 and 2007   "                    "                  "                          "                              "  . TRANSTHORACIC ECHOCARDIOGRAM  2007   NORMAL    Family History  Problem Relation Age of Onset  . Heart attack Mother   . CAD Mother   . Heart disease Mother        Before age 46  . Hypertension Mother   . Hyperlipidemia Mother   . Hypertension Father   . Pneumonia Father   . Hyperlipidemia Father   . Heart murmur Sister   . Osteoporosis Paternal Grandmother   . CAD Paternal Grandfather   . Breast cancer Neg Hx     Social hx: Widow, retired, ex smoker (quit 2009), no alc/drugs.  Current Outpatient Medications:  .  aspirin EC 81 MG tablet, Take 1 tablet (81 mg total) by mouth daily., Disp: 90 tablet, Rfl: 3 .  atorvastatin (LIPITOR) 80 MG tablet, TAKE ONE TABLET BY MOUTH DAILY, Disp: 30 tablet, Rfl: 5 .  cholecalciferol (VITAMIN D) 1000 UNITS tablet, Take 1,000 Units by mouth daily., Disp: , Rfl:  .  clopidogrel (PLAVIX) 75 MG tablet, TAKE ONE TABLET BY MOUTH DAILY, Disp: 30 tablet, Rfl: 5 .  diazepam (VALIUM) 2 MG tablet, TAKE ONE TABLET BY MOUTH TWICE DAILY, Disp: 60 tablet, Rfl: 5 .  famotidine (PEPCID) 40 MG tablet, TAKE ONE TABLET BY MOUTH DAILY WITH LUNCH, Disp: 30 tablet, Rfl: 1 .  fexofenadine (ALLEGRA) 180 MG tablet, Take 180 mg by mouth daily. Take 1 tablet daily for allergies, Disp: , Rfl:  .  fluticasone (FLONASE) 50 MCG/ACT nasal spray, Place 2 sprays into both nostrils daily., Disp: 16 g, Rfl: 2 .  lisinopril (PRINIVIL,ZESTRIL) 5 MG tablet, TAKE ONE TABLET BY MOUTH EVERY DAY, Disp: 30 tablet, Rfl: 5 .  Omega-3 Fatty Acids (FISH OIL) 1000 MG CAPS, Take 2,000 mg by mouth daily. , Disp: , Rfl:  .  pantoprazole (PROTONIX) 40 MG tablet, TAKE ONE TABLET BY MOUTH TWICE DAILY, Disp: 60 tablet, Rfl: 5 .  cetirizine (ZYRTEC) 5 MG tablet, Take 1-2 tablets (5-10 mg total) by mouth daily. (Patient not taking: Reported on 01/30/2019), Disp: 20 tablet, Rfl: 0 .  sucralfate (CARAFATE) 1 GM/10ML suspension, Take 10 mLs (1 g total) by mouth 4 (four) times daily -  with meals and at  bedtime. (Patient not taking: Reported on 01/28/2019), Disp: 420 mL, Rfl:  0  EXAM:  VITALS per patient if applicable: BP 952/84 (BP Location: Left Arm, Patient Position: Sitting, Cuff Size: Normal)   Pulse (!) 52    GENERAL: alert, oriented, appears well and in no acute distress  HEENT: atraumatic, conjunttiva clear, no obvious abnormalities on inspection of external nose and ears  NECK: normal movements of the head and neck  LUNGS: on inspection no signs of respiratory distress, breathing rate appears normal, no obvious gross SOB, gasping or wheezing  CV: no obvious cyanosis  MS: moves all visible extremities without noticeable abnormality  PSYCH/NEURO: pleasant and cooperative, no obvious depression or anxiety, speech and thought processing grossly intact  LABS: none today    Chemistry      Component Value Date/Time   NA 139 11/28/2018 0825   K 4.7 11/28/2018 0825   CL 103 11/28/2018 0825   CO2 31 11/28/2018 0825   BUN 13 11/28/2018 0825   CREATININE 0.70 11/28/2018 0825      Component Value Date/Time   CALCIUM 10.0 11/28/2018 0825   ALKPHOS 115 11/28/2018 0825   AST 22 11/28/2018 0825   ALT 23 11/28/2018 0825   BILITOT 1.4 (H) 11/28/2018 0825     Lab Results  Component Value Date   WBC 5.8 05/31/2018   HGB 14.4 05/31/2018   HCT 43.1 05/31/2018   MCV 92.5 05/31/2018   PLT 293.0 05/31/2018   Lab Results  Component Value Date   TSH 0.92 05/31/2018   Lab Results  Component Value Date   HGBA1C 6.2 11/28/2018   Lab Results  Component Value Date   CHOL 159 11/28/2018   HDL 52.10 11/28/2018   LDLCALC 79 11/28/2018   LDLDIRECT 74.0 11/28/2017   TRIG 137.0 11/28/2018   CHOLHDL 3 11/28/2018   ASSESSMENT AND PLAN:  Discussed the following assessment and plan:  DOE->needs stress test to r/o anginal equivalent/ischemia. Pt to call her heart MD, Dr. Irish Lack to see how soon she can get in to see him.  Would like her to see him within the next 1 week if at all  possible. CBC, BMET, D dimer ordered future (ASAP).  CT angio chest if D dimer positive. No new meds at this time. Will reconvene in approx 2 wks or earlier if necessary.  Spent 25 min with pt today, with >50% of this time spent in counseling and care coordination regarding the above problems.  I discussed the assessment and treatment plan with the patient. The patient was provided an opportunity to ask questions and all were answered. The patient agreed with the plan and demonstrated an understanding of the instructions.   The patient was advised to call back or seek an in-person evaluation if the symptoms worsen or if the condition fails to improve as anticipated.  F/u: 2 wks, earlier if needed.  Signed:  Crissie Sickles, MD           01/30/2019

## 2019-01-30 NOTE — Telephone Encounter (Signed)
Spoke to patient. She states that she spoke with her PCP this morning and he recommended that she contact Dr. Irish Lack for follow up. Patient states that she had an URI in March and that she has been SOB on exertion since then. She states that when she mows the grass she becomes SOB and nauseous. She states that this is relieved with rest. She denies chest pain, palpitations, vomiting, syncope, lightheadedness, dizziness, LEE, or any other Sx. Patient had labs drawn with PCP today and will need to discuss possible stress test. Appointment made for patient to have a VIDEO Visit with Dr. Irish Lack tomorrow at 8:30 AM.      Virtual Visit Pre-Appointment Phone Call TELEPHONE CALL NOTE  [name] has been deemed a candidate for a follow-up tele-health visit to limit community exposure during the Covid-19 pandemic. I spoke with the patient via phone to ensure availability of phone/video source, confirm preferred email & phone number, and discuss instructions and expectations.  I reminded [name] to be prepared with any vital sign and/or heart rhythm information that could potentially be obtained via home monitoring, at the time of her visit. I reminded [name] to expect a phone call prior to her visit.  Patient agrees with consent below.   Cleon Gustin, RN 01/30/2019 12:46 PM    FULL LENGTH CONSENT FOR TELE-HEALTH VISIT   I hereby voluntarily request, consent and authorize [facility] and its employed or contracted physicians, physician assistants, nurse practitioners or other licensed health care professionals (the Practitioner), to provide me with telemedicine health care services (the "Services") as deemed necessary by the treating Practitioner. I acknowledge and consent to receive the Services by the Practitioner via telemedicine. I understand that the telemedicine visit will involve communicating with the Practitioner through live audiovisual communication technology and  the disclosure of certain medical information by electronic transmission. I acknowledge that I have been given the opportunity to request an in-person assessment or other available alternative prior to the telemedicine visit and am voluntarily participating in the telemedicine visit.  I understand that I have the right to withhold or withdraw my consent to the use of telemedicine in the course of my care at any time, without affecting my right to future care or treatment, and that the Practitioner or I may terminate the telemedicine visit at any time. I understand that I have the right to inspect all information obtained and/or recorded in the course of the telemedicine visit and may receive copies of available information for a reasonable fee.  I understand that some of the potential risks of receiving the Services via telemedicine include:  Marland Kitchen Delay or interruption in medical evaluation due to technological equipment failure or disruption; . Information transmitted may not be sufficient (e.g. poor resolution of images) to allow for appropriate medical decision making by the Practitioner; and/or  . In rare instances, security protocols could fail, causing a breach of personal health information.  Furthermore, I acknowledge that it is my responsibility to provide information about my medical history, conditions and care that is complete and accurate to the best of my ability. I acknowledge that Practitioner's advice, recommendations, and/or decision may be based on factors not within their control, such as incomplete or inaccurate data provided by me or distortions of diagnostic images or specimens that may result from electronic transmissions. I understand that the practice of medicine is not an exact science and that Practitioner makes no warranties or guarantees regarding treatment outcomes.  I acknowledge that I will receive a copy of this consent concurrently upon execution via email to the email address I  last provided but may also request a printed copy by calling the office of [facility].    I understand that my insurance will be billed for this visit.   I have read or had this consent read to me. . I understand the contents of this consent, which adequately explains the benefits and risks of the Services being provided via telemedicine.  . I have been provided ample opportunity to ask questions regarding this consent and the Services and have had my questions answered to my satisfaction. . I give my informed consent for the services to be provided through the use of telemedicine in my medical care  By participating in this telemedicine visit I agree to the above.

## 2019-01-31 ENCOUNTER — Encounter: Payer: Self-pay | Admitting: Family Medicine

## 2019-01-31 ENCOUNTER — Encounter: Payer: Self-pay | Admitting: Interventional Cardiology

## 2019-01-31 ENCOUNTER — Telehealth (INDEPENDENT_AMBULATORY_CARE_PROVIDER_SITE_OTHER): Payer: PPO | Admitting: Interventional Cardiology

## 2019-01-31 VITALS — BP 125/77 | HR 76 | Ht 64.0 in | Wt 144.0 lb

## 2019-01-31 DIAGNOSIS — R0609 Other forms of dyspnea: Secondary | ICD-10-CM

## 2019-01-31 DIAGNOSIS — E782 Mixed hyperlipidemia: Secondary | ICD-10-CM

## 2019-01-31 DIAGNOSIS — I739 Peripheral vascular disease, unspecified: Secondary | ICD-10-CM | POA: Diagnosis not present

## 2019-01-31 NOTE — Progress Notes (Signed)
Virtual Visit via Video Note   This visit type was conducted due to national recommendations for restrictions regarding the COVID-19 Pandemic (e.g. social distancing) in an effort to limit this patient's exposure and mitigate transmission in our community.  Due to her co-morbid illnesses, this patient is at least at moderate risk for complications without adequate follow up.  This format is felt to be most appropriate for this patient at this time.  All issues noted in this document were discussed and addressed.  A limited physical exam was performed with this format.  Please refer to the patient's chart for her consent to telehealth for [facility].   Date:  01/31/2019   ID:  [name], DOB Jun 30, 1951, MRN [id number]  Patient Location: Home Provider Location: Home  PCP:  Tammi Sou, MD  Cardiologist:  Larae Grooms, MD  Electrophysiologist:  None   Evaluation Performed:  Follow-Up Visit  Chief Complaint:  DOE, PAD  History of Present Illness:    [name] is a 68 y.o. female with  a family history of significant coronary artery disease. She has a history of early vascular disease and difficult to control hyperlipidemia. She has had significant carotid disease in the past with stents placed in therightinnominate artery 2007)and leftcommon carotid artery (2006).Right CEA in 2008.  She had a cardiac cath in 2013 showing no significant coronary artery disease and only minimal coronary calcification.   Her husband passed away in 10/07/15. He was at work in [place] when he died from a viral illness. It was very quick.  Occasional BP spikes at the MDs office.   She works across the street from the [employer]  She worked during the womens and mens [place] in 2020.  She had a viral infection in 11/2018.  She had a chest xray.  She was treated with antibiotics.  Since then, she has had more DOE.  SHe has not been able to get back to her  usual self.  She gets some DOE with mowing the lawn.    Denies : Chest pain. Dizziness. Leg edema. Nitroglycerin use. Orthopnea. Palpitations. Paroxysmal nocturnal dyspnea.  Syncope.   She wears a mask.  The patient does not have symptoms concerning for COVID-19 infection (fever, chills, cough, or new shortness of breath).    Past Medical History:  Diagnosis Date  . Abdominal bruit 04/2017   Aortic u/s showed NO ANEURISM.  Marland Kitchen Allergy    SEASONAL  . Anxiety   . BCC (basal cell carcinoma), arm, right   . Carotid artery occlusion   . Colon cancer screening 05/2017   Colonoscopy reportedly normal approx 2008.  Cologuard NEG--repeat 3 yrs.  . Essential hypertension, benign 05/22/2014  . H/O hiatal hernia   . Melanoma ([place])   . Mitral valve prolapse   . Mixed hyperlipidemia 05/22/2014  . Osteopenia 06/06/2016   05/2016 DEXA T-score -1.5.  06/2018 DEXA T score -1.1  . Peripheral vascular disease, unspecified ([place]) 05/22/2014   innominate and carotid.  U/S f/u 12/2016 showed no signif change compared to 2017--vascular recommended repeat 1 yr.  Doing well as of 10-06-18 cardiol f/u-->continued on ASA and Plavix.  Marland Kitchen Post-operative nausea and vomiting   . Prediabetes 2018   2018 A1c 6.3%.  05/2018 A1c 6.1%.  . Reflux esophagitis   . SOB (shortness of breath)    Mainly on exercion  . SOB (shortness of breath)    maily on exertion/since 11/2018  . Stroke [facility])  2006   Mini  Stroke- not major  . Tobacco abuse, in remission    quit 2009   Past Surgical History:  Procedure Laterality Date  . ABDOMINAL HYSTERECTOMY  1997  . CARDIAC CATHETERIZATION  01/2012   NORMAL  . CAROTID ENDARTERECTOMY  08/21/2007   right - Dr. Amedeo Plenty  . CHOLECYSTECTOMY  2000  . COLONOSCOPY  approx 2008   (Dr. Lenon Ahmadi pt->normal  . DEXA  06/06/2016   05/2016 T-score -1.5.  06/2018 T score -1.1.  Repeat 2 yrs.  . Incision and drainage of left thenar space abscess  02/23/15   s/p cat bite (Dr. Amedeo Plenty)  . innominate  stent  2007   12/2014 u/s showed patent stent in innominate and L common carotid.  L prox int carotid 40-59% stenosis  . Left common carotid stent  2006 and 2007   "                    "                  "                          "                             "  . TRANSTHORACIC ECHOCARDIOGRAM  2007   NORMAL     Current Meds  Medication Sig  . aspirin EC 81 MG tablet Take 1 tablet (81 mg total) by mouth daily.  Marland Kitchen atorvastatin (LIPITOR) 80 MG tablet TAKE ONE TABLET BY MOUTH DAILY  . cholecalciferol (VITAMIN D) 1000 UNITS tablet Take 1,000 Units by mouth daily.  . clopidogrel (PLAVIX) 75 MG tablet TAKE ONE TABLET BY MOUTH DAILY  . diazepam (VALIUM) 2 MG tablet TAKE ONE TABLET BY MOUTH TWICE DAILY  . famotidine (PEPCID) 40 MG tablet TAKE ONE TABLET BY MOUTH DAILY WITH LUNCH  . fexofenadine (ALLEGRA) 180 MG tablet Take 180 mg by mouth daily. Take 1 tablet daily for allergies  . fluticasone (FLONASE) 50 MCG/ACT nasal spray Place 2 sprays into both nostrils daily.  Marland Kitchen lisinopril (PRINIVIL,ZESTRIL) 5 MG tablet TAKE ONE TABLET BY MOUTH EVERY DAY  . Omega-3 Fatty Acids (FISH OIL) 1000 MG CAPS Take 2,000 mg by mouth daily.   . pantoprazole (PROTONIX) 40 MG tablet TAKE ONE TABLET BY MOUTH TWICE DAILY     Allergies:   Doxycycline; Ciprocin-fluocin-procin [fluocinolone]; Demerol [meperidine]; Fentanyl; and Penicillins   Social History   Tobacco Use  . Smoking status: Former Smoker    Types: Cigarettes    Last attempt to quit: 08/06/2002    Years since quitting: 16.4  . Smokeless tobacco: Never Used  Substance Use Topics  . Alcohol use: No  . Drug use: No     Family Hx: The patient's family history includes CAD in her mother and paternal grandfather; Heart attack in her mother; Heart disease in her mother; Heart murmur in her sister; Hyperlipidemia in her father and mother; Hypertension in her father and mother; Osteoporosis in her paternal grandmother; Pneumonia in her father. There is no  history of Breast cancer.  ROS:   Please see the history of present illness.    Increased DOE All other systems reviewed and are negative.   Prior CV studies:   The following studies were reviewed today:  2013 cath showing no significant disease  Labs/Other  Tests and Data Reviewed:    EKG:  An ECG dated 12/19 was personally reviewed today and demonstrated:  NSR, no ST changes  Recent Labs: 05/31/2018: TSH 0.92 01/30/2019: ALT 24; BUN 15; Creatinine, Ser 0.72; Hemoglobin 12.9; Platelets 364.0; Potassium 4.6; Sodium 140   Recent Lipid Panel Lab Results  Component Value Date/Time   CHOL 159 11/28/2018 08:25 AM   TRIG 137.0 11/28/2018 08:25 AM   HDL 52.10 11/28/2018 08:25 AM   CHOLHDL 3 11/28/2018 08:25 AM   LDLCALC 79 11/28/2018 08:25 AM   LDLDIRECT 74.0 11/28/2017 08:28 AM    Wt Readings from Last 3 Encounters:  01/31/19 144 lb (65.3 kg)  01/28/19 144 lb (65.3 kg)  12/10/18 140 lb (63.5 kg)     Objective:    Vital Signs:  BP 125/77   Pulse 76   Ht 5\' 4"  (1.626 m)   Wt 144 lb (65.3 kg)   BMI 24.72 kg/m    VITAL SIGNS:  reviewed GEN:  no acute distress RESPIRATORY:  normal respiratory effort, symmetric expansion PSYCH:  normal affect exam limited by video format  ASSESSMENT & PLAN:    1. DOE: Persistent since infection in 11/2018. We spoke about the fact since she was exposed to people from all over the [facility], she could have had COVID 19.  Plan for Cardiolite to evaluate for ischemia. 2. PAD: COntinue aggressive secondary prevention.  No symptoms of PAD at this point. 3. Hyperlipidemia: March results well controlled.  Continue current lipid-lowering therapy.  COVID-19 Education: The signs and symptoms of COVID-19 were discussed with the patient and how to seek care for testing (follow up with PCP or arrange E-visit).  The importance of social distancing was discussed today.  Time:   Today, I have spent 25 minutes with the patient with telehealth technology  discussing the above problems.     Medication Adjustments/Labs and Tests Ordered: Current medicines are reviewed at length with the patient today.  Concerns regarding medicines are outlined above.   Tests Ordered: No orders of the defined types were placed in this encounter.   Medication Changes: No orders of the defined types were placed in this encounter.   Disposition:  Follow up in 1 week(s) For stress test  Signed, Larae Grooms, MD  01/31/2019 8:25 AM    [place]

## 2019-01-31 NOTE — Telephone Encounter (Signed)
On 01-28-19 Received fax from Dr. Trula Slade granting clearance to hold Plavix for 5 days prior to 5-7 Endoscopy procedure (now postponed).

## 2019-01-31 NOTE — Patient Instructions (Addendum)
Medication Instructions:  Your physician recommends that you continue on your current medications as directed. Please refer to the Current Medication list given to you today.  If you need a refill on your cardiac medications before your next appointment, please call your pharmacy.   Lab work: None Ordered  If you have labs (blood work) drawn today and your tests are completely normal, you will receive your results only by: Marland Kitchen MyChart Message (if you have MyChart) OR . A paper copy in the mail If you have any lab test that is abnormal or we need to change your treatment, we will call you to review the results.  Testing/Procedures: Your physician has requested that you have a lexiscan myoview. You will be contacted to schedule appointment. For further information please visit [email or web address]. Please follow instruction sheet, as given.   Follow-Up: . Follow up with Dr. Irish Lack via VIDEO Visit on 02/21/19 at 9:40 AM  Any Other Special Instructions Will Be Listed Below (If Applicable).

## 2019-02-01 ENCOUNTER — Telehealth: Payer: Self-pay

## 2019-02-01 ENCOUNTER — Other Ambulatory Visit: Payer: Self-pay | Admitting: Family Medicine

## 2019-02-01 DIAGNOSIS — Z20828 Contact with and (suspected) exposure to other viral communicable diseases: Secondary | ICD-10-CM

## 2019-02-01 DIAGNOSIS — Z20822 Contact with and (suspected) exposure to covid-19: Secondary | ICD-10-CM

## 2019-02-01 LAB — D-DIMER, QUANTITATIVE (NOT AT ARMC): D-Dimer, Quant: 0.2 mcg/mL FEU (ref <0.50)

## 2019-02-01 NOTE — Telephone Encounter (Signed)
This encounter was created in error - please disregard.

## 2019-02-01 NOTE — Telephone Encounter (Signed)
Testing for antibody to coronavirus is available but it does not really change anything.  A positive result means that you have had a coronavirus infection in the past but there are MANY different types of coronavirus out there, not just covid 19. Also, a negative test means no past infection BUT there is nothing different that we are going to do with that information. If she really wants the testing, I'll put future order in for the covid 19 ab test. Let me know--thx

## 2019-02-01 NOTE — Telephone Encounter (Signed)
My Chart message sent

## 2019-02-01 NOTE — Telephone Encounter (Signed)
OK, covid 19 ab test ordered future.

## 2019-02-01 NOTE — Telephone Encounter (Signed)
Pt called and asking for follow up to message regarding testing.

## 2019-02-01 NOTE — Telephone Encounter (Signed)
Pt would like to have testing done.

## 2019-02-04 ENCOUNTER — Telehealth (HOSPITAL_COMMUNITY): Payer: Self-pay

## 2019-02-04 DIAGNOSIS — Z03818 Encounter for observation for suspected exposure to other biological agents ruled out: Secondary | ICD-10-CM | POA: Diagnosis not present

## 2019-02-04 NOTE — Telephone Encounter (Signed)
Patient states that she had test done this morning from [place].  No order needed. Patient states she will let us know what results say.

## 2019-02-04 NOTE — Telephone Encounter (Signed)
Pt contacted with instructions for her stress test. She stated that she understood and would be here.

## 2019-02-04 NOTE — Telephone Encounter (Signed)
Noted  

## 2019-02-05 ENCOUNTER — Telehealth: Payer: Self-pay

## 2019-02-05 ENCOUNTER — Other Ambulatory Visit: Payer: Self-pay

## 2019-02-05 ENCOUNTER — Encounter (HOSPITAL_COMMUNITY): Payer: Self-pay

## 2019-02-05 ENCOUNTER — Ambulatory Visit (HOSPITAL_COMMUNITY): Payer: PPO | Attending: Internal Medicine

## 2019-02-05 DIAGNOSIS — R0609 Other forms of dyspnea: Secondary | ICD-10-CM

## 2019-02-05 HISTORY — PX: CARDIOVASCULAR STRESS TEST: SHX262

## 2019-02-05 LAB — MYOCARDIAL PERFUSION IMAGING
LV dias vol: 33 mL (ref 46–106)
LV sys vol: 14 mL
Peak HR: 111 {beats}/min
Rest HR: 67 {beats}/min
SDS: 4
SRS: 6
SSS: 10
TID: 1.32

## 2019-02-05 MED ORDER — TECHNETIUM TC 99M TETROFOSMIN IV KIT
9.3000 | PACK | Freq: Once | INTRAVENOUS | Status: AC | PRN
  Administered 2019-02-05: 9.3 via INTRAVENOUS
  Filled 2019-02-05: qty 10

## 2019-02-05 MED ORDER — REGADENOSON 0.4 MG/5ML IV SOLN
0.4000 mg | Freq: Once | INTRAVENOUS | Status: AC
  Administered 2019-02-05: 0.4 mg via INTRAVENOUS

## 2019-02-05 MED ORDER — TECHNETIUM TC 99M TETROFOSMIN IV KIT
30.4000 | PACK | Freq: Once | INTRAVENOUS | Status: AC | PRN
  Administered 2019-02-05: 30.4 via INTRAVENOUS
  Filled 2019-02-05: qty 31

## 2019-02-05 NOTE — Telephone Encounter (Signed)
FYI. Please see below.  Copied from [place] [id number]. Topic: General - Other >> Feb 05, 2019 12:04 PM [name] wrote: Reason for CRM: pt called in to update PCP on her covid results.   Pt says that she tested negative for the antibodies.

## 2019-02-06 ENCOUNTER — Encounter: Payer: PPO | Admitting: Gastroenterology

## 2019-02-06 NOTE — Telephone Encounter (Signed)
Noted  

## 2019-02-12 ENCOUNTER — Encounter: Payer: Self-pay | Admitting: Family Medicine

## 2019-02-12 NOTE — Telephone Encounter (Signed)
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 If taking this med then she can take one dose of benadryl 25mg  a day for hives. If not taking allegra 180mg  qd then have her start taking this med for hives.-thx

## 2019-02-12 NOTE — Progress Notes (Signed)
Virtual Visit via Video Note   This visit type was conducted due to national recommendations for restrictions regarding the COVID-19 Pandemic (e.g. social distancing) in an effort to limit this patient's exposure and mitigate transmission in our community.  Due to her co-morbid illnesses, this patient is at least at moderate risk for complications without adequate follow up.  This format is felt to be most appropriate for this patient at this time.  All issues noted in this document were discussed and addressed.  A limited physical exam was performed with this format.  Please refer to the patient's chart for her consent to telehealth for [facility].   Date:  02/13/2019   ID:  [name], DOB 1951-04-02, MRN [id number]  Patient Location: Home Provider Location: Home  PCP:  Tammi Sou, MD  Cardiologist:  Larae Grooms, MD  Electrophysiologist:  None   Evaluation Performed:  Follow-Up Visit  Chief Complaint:  Shortness of breath  History of Present Illness:    [name] is a 68 y.o. female with  with a family history of significant coronary artery disease. She has a history of early vascular disease and difficult to control hyperlipidemia. She has had significant carotid disease in the past with stents placed in therightinnominate artery 2007)and leftcommon carotid artery (2006).Right CEA in 2008.  She had a cardiac cath in 2013 showing no significant coronary artery disease and only minimal coronary calcification.   Her husband passed away in 10/19/15. He was at work in [place] when he died from a viral illness. It was very quick.  Occasional BP spikes at the MDs office.   She works across the street from the [employer]  She worked during the womens and mens [place] in 2020.  She had a viral infection in 11/2018.  She had a chest xray.  She was treated with antibiotics.  Since then, she has had more DOE.  SHe has not been able to  get back to her usual self.  She gets some DOE with mowing the lawn.    Stress test in 01/2019:  Nuclear stress EF: 56%.  The left ventricular ejection fraction is normal (55-65%).  No T wave inversion was noted during stress.  There was no ST segment deviation noted during stress.  The study is normal.  This is a low risk study.   Normal perfusion. LVEF 56% with normal wall motion. This is a low risk study.  She is getting back to walking.  SHe did 30 minutes this AM.  Shortness of breath has improved.    The patient does not have symptoms concerning for COVID-19 infection (fever, chills, cough, or new shortness of breath).    Past Medical History:  Diagnosis Date  . Abdominal bruit 04/2017   Aortic u/s showed NO ANEURISM.  Marland Kitchen Allergy    SEASONAL  . Anxiety   . BCC (basal cell carcinoma), arm, right   . Carotid artery occlusion   . Colon cancer screening 05/2017   Colonoscopy reportedly normal approx 2008.  Cologuard NEG--repeat 3 yrs.  . Essential hypertension, benign 05/22/2014  . H/O hiatal hernia   . Melanoma ([place])   . Mitral valve prolapse   . Mixed hyperlipidemia 05/22/2014  . Osteopenia 06/06/2016   05/2016 DEXA T-score -1.5.  06/2018 DEXA T score -1.1  . Peripheral vascular disease, unspecified ([place]) 05/22/2014   innominate and carotid.  U/S f/u 12/2016 showed no signif change compared to 2017--vascular recommended repeat 1  yr.  Doing well as of 08/2018 cardiol f/u-->continued on ASA and Plavix.  Marland Kitchen Post-operative nausea and vomiting   . Prediabetes 2018   2018 A1c 6.3%.  05/2018 A1c 6.1%.  . Reflux esophagitis   . SOB (shortness of breath)    Mainly on exercion  . SOB (shortness of breath)    maily on exertion/since 11/2018  . Stroke [facility]) 2006   Mini  Stroke- not major  . Tobacco abuse, in remission    quit 2009   Past Surgical History:  Procedure Laterality Date  . ABDOMINAL HYSTERECTOMY  1997  . CARDIAC CATHETERIZATION  01/2012   NORMAL  . CAROTID  ENDARTERECTOMY  08/21/2007   right - Dr. Amedeo Plenty  . CHOLECYSTECTOMY  2000  . COLONOSCOPY  approx 2008   (Dr. Lenon Ahmadi pt->normal  . DEXA  06/06/2016   05/2016 T-score -1.5.  06/2018 T score -1.1.  Repeat 2 yrs.  . Incision and drainage of left thenar space abscess  02/23/15   s/p cat bite (Dr. Amedeo Plenty)  . innominate stent  2007   12/2014 u/s showed patent stent in innominate and L common carotid.  L prox int carotid 40-59% stenosis  . Left common carotid stent  2006 and 2007   "                    "                  "                          "                             "  . TRANSTHORACIC ECHOCARDIOGRAM  2007   NORMAL     Current Meds  Medication Sig  . aspirin EC 81 MG tablet Take 1 tablet (81 mg total) by mouth daily.  Marland Kitchen atorvastatin (LIPITOR) 80 MG tablet TAKE ONE TABLET BY MOUTH DAILY  . cholecalciferol (VITAMIN D) 1000 UNITS tablet Take 1,000 Units by mouth daily.  . clopidogrel (PLAVIX) 75 MG tablet TAKE ONE TABLET BY MOUTH DAILY  . diazepam (VALIUM) 2 MG tablet TAKE ONE TABLET BY MOUTH TWICE DAILY  . famotidine (PEPCID) 40 MG tablet TAKE ONE TABLET BY MOUTH DAILY WITH LUNCH  . fexofenadine (ALLEGRA) 180 MG tablet Take 180 mg by mouth daily. Take 1 tablet daily for allergies  . fluticasone (FLONASE) 50 MCG/ACT nasal spray Place 2 sprays into both nostrils daily.  Marland Kitchen lisinopril (PRINIVIL,ZESTRIL) 5 MG tablet TAKE ONE TABLET BY MOUTH EVERY DAY  . Omega-3 Fatty Acids (FISH OIL) 1000 MG CAPS Take 2,000 mg by mouth daily.   . pantoprazole (PROTONIX) 40 MG tablet TAKE ONE TABLET BY MOUTH TWICE DAILY     Allergies:   Doxycycline; Ciprocin-fluocin-procin [fluocinolone]; Demerol [meperidine]; Fentanyl; and Penicillins   Social History   Tobacco Use  . Smoking status: Former Smoker    Types: Cigarettes    Last attempt to quit: 08/06/2002    Years since quitting: 16.5  . Smokeless tobacco: Never Used  Substance Use Topics  . Alcohol use: No  . Drug use: No     Family Hx: The  patient's family history includes CAD in her mother and paternal grandfather; Heart attack in her mother; Heart disease in her mother; Heart murmur in her sister; Hyperlipidemia in her father and mother;  Hypertension in her father and mother; Osteoporosis in her paternal grandmother; Pneumonia in her father. There is no history of Breast cancer.  ROS:   Please see the history of present illness.    SHOB improving All other systems reviewed and are negative.   Prior CV studies:   The following studies were reviewed today:  Negative stress test  Labs/Other Tests and Data Reviewed:    EKG:  No ECG reviewed.  Recent Labs: 05/31/2018: TSH 0.92 01/30/2019: ALT 24; BUN 15; Creatinine, Ser 0.72; Hemoglobin 12.9; Platelets 364.0; Potassium 4.6; Sodium 140   Recent Lipid Panel Lab Results  Component Value Date/Time   CHOL 159 11/28/2018 08:25 AM   TRIG 137.0 11/28/2018 08:25 AM   HDL 52.10 11/28/2018 08:25 AM   CHOLHDL 3 11/28/2018 08:25 AM   LDLCALC 79 11/28/2018 08:25 AM   LDLDIRECT 74.0 11/28/2017 08:28 AM    Wt Readings from Last 3 Encounters:  02/13/19 145 lb (65.8 kg)  02/05/19 144 lb (65.3 kg)  01/31/19 144 lb (65.3 kg)     Objective:    Vital Signs:  BP 127/72   Pulse 79   Ht 5\' 4"  (1.626 m)   Wt 145 lb (65.8 kg)   BMI 24.89 kg/m    VITAL SIGNS:  reviewed GEN:  no acute distress RESPIRATORY:  normal respiratory effort, symmetric expansion PSYCH:  normal affect exam limited due to video format  ASSESSMENT & PLAN:    1. Shortness of breath: Improved.  Stress test negative.  2. PAD: No sx.  3. Hyperlipidemia: COntinue lipid lowering therapy.  4. Hypertension: The current medical regimen is effective;  continue present plan and medications.  COVID-19 Education: The signs and symptoms of COVID-19 were discussed with the patient and how to seek care for testing (follow up with PCP or arrange E-visit).  The importance of social distancing was discussed today.  Time:    Today, I have spent 15 minutes with the patient with telehealth technology discussing the above problems.     Medication Adjustments/Labs and Tests Ordered: Current medicines are reviewed at length with the patient today.  Concerns regarding medicines are outlined above.   Tests Ordered: No orders of the defined types were placed in this encounter.   Medication Changes: No orders of the defined types were placed in this encounter.   Disposition:  Follow up in 6 month(s)  Signed, Larae Grooms, MD  02/13/2019 9:42 AM    [place]

## 2019-02-13 ENCOUNTER — Other Ambulatory Visit: Payer: Self-pay

## 2019-02-13 ENCOUNTER — Telehealth (INDEPENDENT_AMBULATORY_CARE_PROVIDER_SITE_OTHER): Payer: PPO | Admitting: Interventional Cardiology

## 2019-02-13 ENCOUNTER — Encounter: Payer: Self-pay | Admitting: Interventional Cardiology

## 2019-02-13 VITALS — BP 127/72 | HR 79 | Ht 64.0 in | Wt 145.0 lb

## 2019-02-13 DIAGNOSIS — E782 Mixed hyperlipidemia: Secondary | ICD-10-CM

## 2019-02-13 DIAGNOSIS — R0609 Other forms of dyspnea: Secondary | ICD-10-CM | POA: Diagnosis not present

## 2019-02-13 DIAGNOSIS — I10 Essential (primary) hypertension: Secondary | ICD-10-CM | POA: Diagnosis not present

## 2019-02-13 DIAGNOSIS — I739 Peripheral vascular disease, unspecified: Secondary | ICD-10-CM | POA: Diagnosis not present

## 2019-02-13 NOTE — Patient Instructions (Signed)

## 2019-02-15 ENCOUNTER — Encounter: Payer: Self-pay | Admitting: Family Medicine

## 2019-02-20 ENCOUNTER — Encounter: Payer: Self-pay | Admitting: Family Medicine

## 2019-02-20 ENCOUNTER — Other Ambulatory Visit: Payer: Self-pay

## 2019-02-20 ENCOUNTER — Ambulatory Visit (INDEPENDENT_AMBULATORY_CARE_PROVIDER_SITE_OTHER): Payer: PPO | Admitting: Family Medicine

## 2019-02-20 ENCOUNTER — Ambulatory Visit: Payer: Self-pay | Admitting: Family Medicine

## 2019-02-20 VITALS — BP 139/82 | HR 94 | Resp 16

## 2019-02-20 DIAGNOSIS — R040 Epistaxis: Secondary | ICD-10-CM

## 2019-02-20 DIAGNOSIS — Z7982 Long term (current) use of aspirin: Secondary | ICD-10-CM | POA: Diagnosis not present

## 2019-02-20 DIAGNOSIS — Z7901 Long term (current) use of anticoagulants: Secondary | ICD-10-CM

## 2019-02-20 DIAGNOSIS — K1370 Unspecified lesions of oral mucosa: Secondary | ICD-10-CM | POA: Insufficient documentation

## 2019-02-20 DIAGNOSIS — Z87891 Personal history of nicotine dependence: Secondary | ICD-10-CM | POA: Diagnosis not present

## 2019-02-20 DIAGNOSIS — J3489 Other specified disorders of nose and nasal sinuses: Secondary | ICD-10-CM | POA: Diagnosis not present

## 2019-02-20 DIAGNOSIS — Z95818 Presence of other cardiac implants and grafts: Secondary | ICD-10-CM | POA: Diagnosis not present

## 2019-02-20 DIAGNOSIS — D102 Benign neoplasm of floor of mouth: Secondary | ICD-10-CM | POA: Diagnosis not present

## 2019-02-20 NOTE — Telephone Encounter (Signed)
Please advise, thanks.

## 2019-02-20 NOTE — Telephone Encounter (Signed)
Pt. Reports she started having a nose bleed 1 hour ago. Has soaked numerous paper towels and has had to lean over the sink. Passing clots as well. Is on Plavix. Requesting to see Dr. Anitra Lauth. Warm transfer to Diane in the practice.  Answer Assessment - Initial Assessment Questions 1. AMOUNT OF BLEEDING: "How bad is the bleeding?" "How much blood was lost?" "Has the bleeding stopped?"   - MILD: needed a couple tissues   - MODERATE: needed many tissues   - SEVERE: large blood clots, soaked many tissues, lasted more than 30 minutes      Severe 2. ONSET: "When did the nosebleed start?"      1 hour ago 3. FREQUENCY: "How many nosebleeds have you had in the last 24 hours?"      1 4. RECURRENT SYMPTOMS: "Have there been other recent nosebleeds?" If so, ask: "How long did it take you to stop the bleeding?" "What worked best?"      No 5. CAUSE: "What do you think caused this nosebleed?"     Unsure 6. LOCAL FACTORS: "Do you have any cold symptoms?", "Have you been rubbing or picking at your nose?"     No 7. SYSTEMIC FACTORS: "Do you have high blood pressure or any bleeding problems?"     No 8. BLOOD THINNERS: "Do you take any blood thinners?" (e.g., coumadin, heparin, aspirin, Plavix)     Plavix 9. OTHER SYMPTOMS: "Do you have any other symptoms?" (e.g., lightheadedness)     No 10. PREGNANCY: "Is there any chance you are pregnant?" "When was your last menstrual period?"       No  Protocols used: NOSEBLEED-A-AH

## 2019-02-20 NOTE — Progress Notes (Signed)
OFFICE VISIT  02/20/2019   CC:  Chief Complaint  Patient presents with  . Epistaxis     HPI:    Patient is a 68 y.o. Caucasian female who presents for a nose bleed, onset about 2.5 hours ago, some clots noted on paper towels.  Onset after she blew her nose.  Having some pain in R nostril. Had small nosebleed last night that she stopped easily.  Used saline nasal spray last night.   She does not have any Afrin. Blowing nose a lot lately, mowed her yard yesterday.  Past Medical History:  Diagnosis Date  . Abdominal bruit 04/2017   Aortic u/s showed NO ANEURISM.  Marland Kitchen Allergy    SEASONAL  . Anxiety   . BCC (basal cell carcinoma), arm, right   . Carotid artery occlusion   . Colon cancer screening 05/2017   Colonoscopy reportedly normal approx 2008.  Cologuard NEG--repeat 3 yrs.  . Essential hypertension, benign 05/22/2014  . H/O hiatal hernia   . Melanoma ([place])   . Mitral valve prolapse   . Mixed hyperlipidemia 05/22/2014  . Osteopenia 06/06/2016   05/2016 DEXA T-score -1.5.  06/2018 DEXA T score -1.1  . Peripheral vascular disease, unspecified ([place]) 05/22/2014   innominate and carotid.  U/S f/u 12/2016 showed no signif change compared to 2017--vascular recommended repeat 1 yr.  Doing well as of 08/2018 cardiol f/u-->continued on ASA and Plavix.  . Prediabetes 2018   2018 A1c 6.3%.  05/2018 A1c 6.1%.  . Reflux esophagitis   . SOB (shortness of breath)    maily on exertion/since 11/2018.  Stress test neg 01/2019.  Improving as of cardiology f/u 01/2019.  Marland Kitchen Stroke [facility]) 2006   Mini  Stroke- not major  . Tobacco abuse, in remission    quit 2009    Past Surgical History:  Procedure Laterality Date  . ABDOMINAL HYSTERECTOMY  1997  . CARDIAC CATHETERIZATION  01/2012   NORMAL  . CARDIOVASCULAR STRESS TEST  02/05/2019   NORMAL.  EF normal.  . CAROTID ENDARTERECTOMY  08/21/2007   right - Dr. Amedeo Plenty  . CHOLECYSTECTOMY  2000  . COLONOSCOPY  approx 2008   (Dr. Lenon Ahmadi pt->normal   . DEXA  06/06/2016   05/2016 T-score -1.5.  06/2018 T score -1.1.  Repeat 2 yrs.  . Incision and drainage of left thenar space abscess  02/23/15   s/p cat bite (Dr. Amedeo Plenty)  . innominate stent  2007   12/2014 u/s showed patent stent in innominate and L common carotid.  L prox int carotid 40-59% stenosis  . Left common carotid stent  2006 and 2007   "                    "                  "                          "                             "  . TRANSTHORACIC ECHOCARDIOGRAM  2007   NORMAL    Outpatient Medications Prior to Visit  Medication Sig Dispense Refill  . aspirin EC 81 MG tablet Take 1 tablet (81 mg total) by mouth daily. 90 tablet 3  . atorvastatin (LIPITOR) 80 MG tablet TAKE ONE TABLET BY MOUTH DAILY  30 tablet 5  . cholecalciferol (VITAMIN D) 1000 UNITS tablet Take 1,000 Units by mouth daily.    . clopidogrel (PLAVIX) 75 MG tablet TAKE ONE TABLET BY MOUTH DAILY 30 tablet 5  . diazepam (VALIUM) 2 MG tablet TAKE ONE TABLET BY MOUTH TWICE DAILY 60 tablet 5  . famotidine (PEPCID) 40 MG tablet TAKE ONE TABLET BY MOUTH DAILY WITH LUNCH 30 tablet 1  . fexofenadine (ALLEGRA) 180 MG tablet Take 180 mg by mouth daily. Take 1 tablet daily for allergies    . fluticasone (FLONASE) 50 MCG/ACT nasal spray Place 2 sprays into both nostrils daily. 16 g 2  . lisinopril (PRINIVIL,ZESTRIL) 5 MG tablet TAKE ONE TABLET BY MOUTH EVERY DAY 30 tablet 5  . Omega-3 Fatty Acids (FISH OIL) 1000 MG CAPS Take 2,000 mg by mouth daily.     . pantoprazole (PROTONIX) 40 MG tablet TAKE ONE TABLET BY MOUTH TWICE DAILY 60 tablet 5   No facility-administered medications prior to visit.     Allergies  Allergen Reactions  . Doxycycline Shortness Of Breath and Swelling    Lips swelling and short of breath  . Ciprocin-Fluocin-Procin [Fluocinolone] Nausea And Vomiting  . Demerol [Meperidine] Nausea And Vomiting  . Fentanyl Nausea And Vomiting    Pt states all pain medications cause N/V for her  . Penicillins  Hives    ROS As per HPI  PE: Blood pressure 139/82, pulse 94, resp. rate 16, SpO2 97 %. Gen: Alert, well appearing.  Patient is oriented to person, place, time, and situation. AFFECT: pleasant, lucid thought and speech. R nostril with large clot adherent to septum about 2 cm back from nare.  No active bleeding. L nostril with small amount of clear mucous draining, otherwise normal. Oropharynx without bleeding or lesion.  I can't see any blood actively draining down back of throat.  LABS:    Chemistry      Component Value Date/Time   NA 140 01/30/2019 1056   K 4.6 01/30/2019 1056   CL 103 01/30/2019 1056   CO2 28 01/30/2019 1056   BUN 15 01/30/2019 1056   CREATININE 0.72 01/30/2019 1056      Component Value Date/Time   CALCIUM 10.0 01/30/2019 1056   ALKPHOS 109 01/30/2019 1056   AST 21 01/30/2019 1056   ALT 24 01/30/2019 1056   BILITOT 1.3 (H) 01/30/2019 1056     Lab Results  Component Value Date   WBC 6.3 01/30/2019   HGB 12.9 01/30/2019   HCT 39.0 01/30/2019   MCV 86.5 01/30/2019   PLT 364.0 01/30/2019     IMPRESSION AND PLAN:  Epistaxis, R nostril, anterior, site of large clot is on nasal septum. She is on ASA and plavix and has taken them already today. [facility] was kind enough to see patient this afternoon. Pt's nose was not actively bleeding when she left office today at 11:15 AM.  An After Visit Summary was printed and given to the patient.  FOLLOW UP: as needed.  Signed:  Crissie Sickles, MD           02/20/2019

## 2019-02-20 NOTE — Telephone Encounter (Signed)
OK to see in office. Hold ice pack against the nostril that is bleeding and keep head level (no need to keep head tilted excessively back or forward).  -thx

## 2019-02-20 NOTE — Telephone Encounter (Signed)
Patient in office now for appt.

## 2019-02-21 ENCOUNTER — Telehealth: Payer: PPO | Admitting: Interventional Cardiology

## 2019-02-25 DIAGNOSIS — Z8719 Personal history of other diseases of the digestive system: Secondary | ICD-10-CM

## 2019-02-25 HISTORY — DX: Personal history of other diseases of the digestive system: Z87.19

## 2019-02-27 ENCOUNTER — Ambulatory Visit: Payer: PPO | Admitting: *Deleted

## 2019-02-27 ENCOUNTER — Other Ambulatory Visit: Payer: Self-pay

## 2019-02-27 VITALS — Ht 64.5 in | Wt 145.0 lb

## 2019-02-27 DIAGNOSIS — K219 Gastro-esophageal reflux disease without esophagitis: Secondary | ICD-10-CM

## 2019-02-27 DIAGNOSIS — R1310 Dysphagia, unspecified: Secondary | ICD-10-CM

## 2019-02-27 NOTE — Progress Notes (Signed)
Patient's pre-visit was done today over the phone with the patient due to Covid-19. Patient states no changes in medical,surgical or medications since last PV was done on 01/28/2019. Name,DOB and address verified. Insurance verified. Packet of Prep instructions mailed to patient including copy of a consent form and pre-procedure patient acknowledgement form-pt is aware. Patient understands to call us back with any questions or concerns.   Patient denies any allergies to eggs or soy. Patient denies any problems with anesthesia/sedation. Patient denies any oxygen use at home. Patient denies taking any diet/weight loss medications. Patient to hold Plavix x 5 days-see TE-pt is aware. Patient cleared for EGD by cardiologist.  EMMI education assisgned to patient on EGD, this was explained and instructions given to patient.

## 2019-03-04 ENCOUNTER — Encounter: Payer: Self-pay | Admitting: Gastroenterology

## 2019-03-08 ENCOUNTER — Telehealth: Payer: Self-pay

## 2019-03-08 NOTE — Telephone Encounter (Signed)
Covid-19 screening questions  Have you traveled in the last 14 days? No. If yes where?  Do you now or have you had a fever in the last 14 days? No.  Do you have any respiratory symptoms of shortness of breath or cough now or in the last 14 days? No.  Do you have any family members or close contacts with diagnosed or suspected Covid-19 in the past 14 days? No.  Have you been tested for Covid-19 and found to be positive? No.       

## 2019-03-11 ENCOUNTER — Ambulatory Visit (AMBULATORY_SURGERY_CENTER): Payer: PPO | Admitting: Gastroenterology

## 2019-03-11 ENCOUNTER — Other Ambulatory Visit: Payer: Self-pay

## 2019-03-11 ENCOUNTER — Encounter: Payer: Self-pay | Admitting: Gastroenterology

## 2019-03-11 VITALS — BP 135/74 | HR 76 | Temp 98.8°F | Resp 19 | Ht 64.0 in | Wt 146.0 lb

## 2019-03-11 DIAGNOSIS — I739 Peripheral vascular disease, unspecified: Secondary | ICD-10-CM | POA: Diagnosis not present

## 2019-03-11 DIAGNOSIS — I10 Essential (primary) hypertension: Secondary | ICD-10-CM | POA: Diagnosis not present

## 2019-03-11 DIAGNOSIS — K449 Diaphragmatic hernia without obstruction or gangrene: Secondary | ICD-10-CM | POA: Diagnosis not present

## 2019-03-11 DIAGNOSIS — R1310 Dysphagia, unspecified: Secondary | ICD-10-CM

## 2019-03-11 DIAGNOSIS — K219 Gastro-esophageal reflux disease without esophagitis: Secondary | ICD-10-CM

## 2019-03-11 DIAGNOSIS — K317 Polyp of stomach and duodenum: Secondary | ICD-10-CM

## 2019-03-11 DIAGNOSIS — I2510 Atherosclerotic heart disease of native coronary artery without angina pectoris: Secondary | ICD-10-CM | POA: Diagnosis not present

## 2019-03-11 HISTORY — PX: ESOPHAGOGASTRODUODENOSCOPY: SHX1529

## 2019-03-11 MED ORDER — SODIUM CHLORIDE 0.9 % IV SOLN: 500.0000 mL | Freq: Once | INTRAVENOUS | Status: DC

## 2019-03-11 NOTE — Progress Notes (Signed)
Report given to PACU, vss 

## 2019-03-11 NOTE — Op Note (Signed)
[place] Patient Name: [name] Procedure Date: 03/11/2019 9:37 AM MRN: [id number] Endoscopist: Remo Lipps P. Havery Moros , MD Age: 68 Referring MD:  Date of Birth: [birth year] Gender: Female Account #: [id number] Procedure:                Upper GI endoscopy Indications:              refractory gastro-esophageal reflux disease despite                            high dose protonix and pepcid, severe                            regurgitation, occasional dysphagia Medicines:                Monitored Anesthesia Care Procedure:                Pre-Anesthesia Assessment:                           - Prior to the procedure, a History and Physical                            was performed, and patient medications and                            allergies were reviewed. The patient's tolerance of                            previous anesthesia was also reviewed. The risks                            and benefits of the procedure and the sedation                            options and risks were discussed with the patient.                            All questions were answered, and informed consent                            was obtained. Prior Anticoagulants: The patient has                            taken Plavix (clopidogrel), last dose was 5 days                            prior to procedure. After reviewing the risks and                            benefits, the patient was deemed in satisfactory                            condition to undergo the procedure.  After obtaining informed consent, the endoscope was                            passed under direct vision. Throughout the                            procedure, the patient's blood pressure, pulse, and                            oxygen saturations were monitored continuously. The                            Endoscope was introduced through the mouth, and                            advanced to the second part  of duodenum. The upper                            GI endoscopy was accomplished without difficulty.                            The patient tolerated the procedure well. Scope In: Scope Out: Findings:                 Esophagogastric landmarks were identified: the                            Z-line was found at 33 cm, the gastroesophageal                            junction was found at 33 cm and the upper extent of                            the gastric folds was found at 38 cm from the                            incisors.                           A 5 cm hiatal hernia was present, one Cameron                            lesion / ulceration was noted at the distal end of                            the hernia sac.                           The exam of the esophagus was otherwise normal.                           Multiple medium sessile polyps were found in the  gastric antrum, consistent with benign fundic gland                            polyps. Biopsies were taken with a cold forceps for                            histology.                           The exam of the stomach was otherwise normal.                           The duodenal bulb and second portion of the                            duodenum were normal. Complications:            No immediate complications. Estimated blood loss:                            Minimal. Estimated Blood Loss:     Estimated blood loss was minimal. Impression:               - Esophagogastric landmarks identified.                           - 5 cm hiatal hernia.                           - Multiple benign appearing fundic gland gastric                            polyps. Biopsied.                           - Normal duodenal bulb and second portion of the                            duodenum. Recommendation:           - Patient has a contact number available for                            emergencies. The signs and symptoms of  potential                            delayed complications were discussed with the                            patient. Return to normal activities tomorrow.                            Written discharge instructions were provided to the                            patient.                           -  Resume previous diet.                           - Continue present medications. Resume Plavix today                           - Await pathology results.                           - Given refractory symptoms to medical therapy and                            large hiatal hernia on this exam, patient warrants                            repair of hiatal hernia with reflux procedure to                            control symptoms. This could be done with hernia                            repair + Nissen or hernia repair + TIF, will                            discuss options with patient if she wishes to                            proceed with therapy. Symptoms not likely to                            improve with medical therapy Remo Lipps P. Armbruster, MD 03/11/2019 9:56:36 AM This report has been signed electronically.

## 2019-03-11 NOTE — Patient Instructions (Signed)
RESUME Plavix today.   YOU HAD AN ENDOSCOPIC PROCEDURE TODAY AT [facility]:   Refer to the procedure report that was given to you for any specific questions about what was found during the examination.  If the procedure report does not answer your questions, please call your gastroenterologist to clarify.  If you requested that your care partner not be given the details of your procedure findings, then the procedure report has been included in a sealed envelope for you to review at your convenience later.  YOU SHOULD EXPECT: Some feelings of bloating in the abdomen. Passage of more gas than usual.  Walking can help get rid of the air that was put into your GI tract during the procedure and reduce the bloating. If you had a lower endoscopy (such as a colonoscopy or flexible sigmoidoscopy) you may notice spotting of blood in your stool or on the toilet paper. If you underwent a bowel prep for your procedure, you may not have a normal bowel movement for a few days.  Please Note:  You might notice some irritation and congestion in your nose or some drainage.  This is from the oxygen used during your procedure.  There is no need for concern and it should clear up in a day or so.  SYMPTOMS TO REPORT IMMEDIATELY:   Following upper endoscopy (EGD)  Vomiting of blood or coffee ground material  New chest pain or pain under the shoulder blades  Painful or persistently difficult swallowing  New shortness of breath  Fever of 100F or higher  Black, tarry-looking stools  For urgent or emergent issues, a gastroenterologist can be reached at any hour by calling [phone].   DIET:  We do recommend a small meal at first, but then you may proceed to your regular diet.  Drink plenty of fluids but you should avoid alcoholic beverages for 24 hours.  ACTIVITY:  You should plan to take it easy for the rest of today and you should NOT DRIVE or use heavy machinery until tomorrow (because of the  sedation medicines used during the test).    FOLLOW UP: Our staff will call the number listed on your records 48-72 hours following your procedure to check on you and address any questions or concerns that you may have regarding the information given to you following your procedure. If we do not reach you, we will leave a message.  We will attempt to reach you two times.  During this call, we will ask if you have developed any symptoms of COVID 19. If you develop any symptoms (ie: fever, flu-like symptoms, shortness of breath, cough etc.) before then, please call [phone].  If you test positive for Covid 19 in the 2 weeks post procedure, please call and report this information to [place].    If any biopsies were taken you will be contacted by phone or by letter within the next 1-3 weeks.  Please call us at [phone] if you have not heard about the biopsies in 3 weeks.    SIGNATURES/CONFIDENTIALITY: You and/or your care partner have signed paperwork which will be entered into your electronic medical record.  These signatures attest to the fact that that the information above on your After Visit Summary has been reviewed and is understood.  Full responsibility of the confidentiality of this discharge information lies with you and/or your care-partner.

## 2019-03-11 NOTE — Progress Notes (Signed)
Called to room to assist during endoscopic procedure.  Patient ID and intended procedure confirmed with present staff. Received instructions for my participation in the procedure from the performing physician.  

## 2019-03-12 ENCOUNTER — Other Ambulatory Visit: Payer: Self-pay

## 2019-03-12 ENCOUNTER — Telehealth: Payer: Self-pay

## 2019-03-12 DIAGNOSIS — K219 Gastro-esophageal reflux disease without esophagitis: Secondary | ICD-10-CM

## 2019-03-12 NOTE — Telephone Encounter (Signed)
Patient referred to Dr. Romana Juniper for Hiatal Hernia repair (in combination w/Dr. Vivia Ewing TIF) Patient called and let her know their office would be calling her to schedule the surgery

## 2019-03-12 NOTE — Telephone Encounter (Signed)
-----   Message from Yetta Flock, MD sent at 03/11/2019  5:24 PM EDT ----- Regarding: FW: possible TIF Sherlynn Stalls can you please refer this patient to Dr. Romana Juniper for hiatal hernia repair in combination with TIF with Dr. Bryan Lemma? I spoke with her about this after her exam today. Thanks    ----- Message ----- From: Lavena Bullion, DO Sent: 03/11/2019   4:41 PM EDT To: Yetta Flock, MD Subject: RE: possible TIF                               Just looked at your images. That's a good size hernia!  Absolutely, happy to see her in clinic, and agree with you to get surgery on board. I think I'm going to try to do some cases with Romana Juniper, so if you don't mind sending referral to her for consideration of [facility] repair with TIF and I will set her up to see me in clinic as well to talk about concomitant TIF versus hernia repair with partial Nissen.  I think both could be successful for her, I completely agree that without mechanical/anatomic correction, her reflux will likely just continue to progress.Thanks!Vito ----- Message ----- From: Yetta Flock, MD Sent: 03/11/2019   4:37 PM EDT To: Lavena Bullion, DO Subject: possible TIF                                   Hey buddy, This lady has refractory reflux despite high dose PPI and pepcid. She has a 5cm hiatal hernia with Lysbeth Galas lesion, I think it needs to be repaired. Discussed options with her, told her about hiatal hernia repair with TIF, combo procedure with you and surgery, and she is interested. Do you want to see her? If so, which surgeon do you want me to refer her to, or would you just want to see her first? Thanks   Richardson Landry

## 2019-03-13 ENCOUNTER — Telehealth: Payer: Self-pay | Admitting: *Deleted

## 2019-03-13 ENCOUNTER — Encounter: Payer: PPO | Admitting: Gastroenterology

## 2019-03-13 NOTE — Telephone Encounter (Signed)
1. Have you developed a fever since your procedure? no  2.   Have you had an respiratory symptoms (SOB or cough) since your procedure? no  3.   Have you tested positive for COVID 19 since your procedure no  4.   Have you had any family members/close contacts diagnosed with the COVID 19 since your procedure?  no   If yes to any of these questions please route to Joylene John, RN and Alphonsa Gin, Therapist, sports.  Follow up Call-  Call back number 03/11/2019  Post procedure Call Back phone  # [phone]  Permission to leave phone message Yes  Some recent data might be hidden     Patient questions:  Do you have a fever, pain , or abdominal swelling? No. Pain Score  0 *  Have you tolerated food without any problems? Yes.    Have you been able to return to your normal activities? Yes.    Do you have any questions about your discharge instructions: Diet   No. Medications  No. Follow up visit  No.  Do you have questions or concerns about your Care? No.  Actions: * If pain score is 4 or above: No action needed, pain <4.

## 2019-03-19 ENCOUNTER — Other Ambulatory Visit: Payer: Self-pay

## 2019-03-19 ENCOUNTER — Telehealth (INDEPENDENT_AMBULATORY_CARE_PROVIDER_SITE_OTHER): Payer: PPO | Admitting: Gastroenterology

## 2019-03-19 ENCOUNTER — Encounter: Payer: Self-pay | Admitting: Gastroenterology

## 2019-03-19 VITALS — BP 130/71 | HR 80 | Ht 64.0 in | Wt 145.5 lb

## 2019-03-19 DIAGNOSIS — K219 Gastro-esophageal reflux disease without esophagitis: Secondary | ICD-10-CM

## 2019-03-19 DIAGNOSIS — Z7902 Long term (current) use of antithrombotics/antiplatelets: Secondary | ICD-10-CM | POA: Diagnosis not present

## 2019-03-19 DIAGNOSIS — K449 Diaphragmatic hernia without obstruction or gangrene: Secondary | ICD-10-CM | POA: Diagnosis not present

## 2019-03-19 DIAGNOSIS — I739 Peripheral vascular disease, unspecified: Secondary | ICD-10-CM | POA: Diagnosis not present

## 2019-03-19 DIAGNOSIS — R1310 Dysphagia, unspecified: Secondary | ICD-10-CM | POA: Diagnosis not present

## 2019-03-19 DIAGNOSIS — R1319 Other dysphagia: Secondary | ICD-10-CM

## 2019-03-19 NOTE — Progress Notes (Signed)
Chief Complaint: GERD, evaluation for hiatal hernia repair and TIF  Referring Provider:    Yetta Flock, MD   HPI:    Due to current restrictions/limitations of in-office visits due to the COVID-19 pandemic, this scheduled clinical appointment was converted to a telehealth virtual consultation using D[employer].  -Time of medical discussion: 26 minutes -The patient did consent to this virtual visit and is aware of possible charges through their insurance for this visit.  -Names of all parties present: [name] (patient), Laura Heck, DO, [facility] (physician) -Patient location: Home -Physician location: Office  [name] is a 68 y.o. female with a history of CVA (on Plavix), Right CEA (2007), left carotid stent, right subclavian stent, osteopenia, and longstanding history of GERD complicated by erosive esophagitis, referred to me by Dr. Havery Moros for evaluation of possible antireflux intervention with Transoral Incisionless Fundoplication (TIF) is a goal to stop or significantly reduce acid suppression therapy.  Has had reflux for multiple years, with occasional pyrosis but more significantly endorses regurgitation.  Positive nocturnal symptoms of regurgitation/choking which is increasingly bothersome.  Currently taking Protonix 40 mg twice daily and Pepcid 40 mg daily along with avoiding eating within 3 hours of bedtime (eats at 5:30), HOB elevation.  Recently prescribed Carafate  prn as well.  Worse with forward flexion, particularly with regurgitation.  Does endorse intermittent solid food dysphagia,pointing to anterior neck, occurring <once/month, typically cleared with fluids/repeat swallowing.  Does endorse dyspepsia.   GERD history: -Index symptoms: Regurgitation, pyrosis, nocturnal choking/regurgitation, dyspepsia -Medications trialed: Nexium, omeprazole, Carafate prn -Current medications: Protonix 40 mg bid, Pepcid 40 mg qd -Complications: Dysphagia,  5 cm hiatal hernia  GERD evaluation: - CXR (11/2018): Small HH - EGD (02/2019, Dr. Havery Moros): 5 cm HH, Cameron ulceration, fundic gland polyps  Past medical history, past surgical history, social history, family history, medications, and allergies reviewed in the chart and with patient.    Past Medical History:  Diagnosis Date  . Abdominal bruit 04/2017   Aortic u/s showed NO ANEURISM.  Marland Kitchen Allergy    SEASONAL  . Anxiety   . BCC (basal cell carcinoma), arm, right   . Carotid artery occlusion   . Cataract   . Colon cancer screening 05/2017   Colonoscopy reportedly normal approx 2008.  Cologuard NEG--repeat 3 yrs.  . Essential hypertension, benign 05/22/2014  . GERD (gastroesophageal reflux disease)   . H/O hiatal hernia   . Melanoma ([place])   . Mitral valve prolapse   . Mixed hyperlipidemia 05/22/2014  . Osteopenia 06/06/2016   05/2016 DEXA T-score -1.5.  06/2018 DEXA T score -1.1  . Peripheral vascular disease, unspecified ([place]) 05/22/2014   innominate and carotid.  U/S f/u 12/2016 showed no signif change compared to 2017--vascular recommended repeat 1 yr.  Doing well as of 08/2018 cardiol f/u-->continued on ASA and Plavix.  . Prediabetes 2018   2018 A1c 6.3%.  05/2018 A1c 6.1%.  . Reflux esophagitis   . SOB (shortness of breath)    maily on exertion/since 11/2018.  Stress test neg 01/2019.  Improving as of cardiology f/u 01/2019.  Marland Kitchen Stroke [facility]) 2006   Mini  Stroke- not major  . Tobacco abuse, in remission    quit 2009     Past Surgical History:  Procedure Laterality Date  . ABDOMINAL HYSTERECTOMY  1997  . CARDIAC CATHETERIZATION  01/2012   NORMAL  . CARDIOVASCULAR STRESS TEST  02/05/2019  NORMAL.  EF normal.  . CAROTID ENDARTERECTOMY  08/21/2007   right - Dr. Amedeo Plenty  . CHOLECYSTECTOMY  2000  . COLONOSCOPY  approx 2008   (Dr. Lenon Ahmadi pt->normal  . DEXA  06/06/2016   05/2016 T-score -1.5.  06/2018 T score -1.1.  Repeat 2 yrs.  . Incision and drainage of left thenar space  abscess  02/23/15   s/p cat bite (Dr. Amedeo Plenty)  . innominate stent  2007   12/2014 u/s showed patent stent in innominate and L common carotid.  L prox int carotid 40-59% stenosis  . Left common carotid stent  2006 and 2007   "                    "                  "                          "                             "  . TRANSTHORACIC ECHOCARDIOGRAM  2007   NORMAL   Family History  Problem Relation Age of Onset  . Heart attack Mother   . CAD Mother   . Heart disease Mother        Before age 58  . Hypertension Mother   . Hyperlipidemia Mother   . Hypertension Father   . Pneumonia Father   . Hyperlipidemia Father   . Heart murmur Sister   . Osteoporosis Paternal Grandmother   . CAD Paternal Grandfather   . Breast cancer Neg Hx   . Colon cancer Neg Hx   . Esophageal cancer Neg Hx   . Rectal cancer Neg Hx   . Stomach cancer Neg Hx   . Colon polyps Neg Hx    Social History   Tobacco Use  . Smoking status: Former Smoker    Types: Cigarettes    Quit date: 08/06/2002    Years since quitting: 16.6  . Smokeless tobacco: Never Used  Substance Use Topics  . Alcohol use: No  . Drug use: No   Current Outpatient Medications  Medication Sig Dispense Refill  . aspirin EC 81 MG tablet Take 1 tablet (81 mg total) by mouth daily. 90 tablet 3  . atorvastatin (LIPITOR) 80 MG tablet TAKE ONE TABLET BY MOUTH DAILY 30 tablet 5  . cholecalciferol (VITAMIN D) 1000 UNITS tablet Take 1,000 Units by mouth daily.    . clopidogrel (PLAVIX) 75 MG tablet TAKE ONE TABLET BY MOUTH DAILY 30 tablet 5  . diazepam (VALIUM) 2 MG tablet TAKE ONE TABLET BY MOUTH TWICE DAILY 60 tablet 5  . famotidine (PEPCID) 40 MG tablet TAKE ONE TABLET BY MOUTH DAILY WITH LUNCH 30 tablet 1  . fexofenadine (ALLEGRA) 180 MG tablet Take 180 mg by mouth daily. Take 1 tablet daily for allergies    . fluticasone (FLONASE) 50 MCG/ACT nasal spray Place 2 sprays into both nostrils daily. (Patient not taking: Reported on 03/11/2019)  16 g 2  . lisinopril (PRINIVIL,ZESTRIL) 5 MG tablet TAKE ONE TABLET BY MOUTH EVERY DAY 30 tablet 5  . Omega-3 Fatty Acids (FISH OIL) 1000 MG CAPS Take 2,000 mg by mouth daily.     . pantoprazole (PROTONIX) 40 MG tablet TAKE ONE TABLET BY MOUTH TWICE DAILY 60 tablet 5   No current  facility-administered medications for this visit.    Allergies  Allergen Reactions  . Doxycycline Shortness Of Breath and Swelling    Lips swelling and short of breath  . Fentanyl Nausea And Vomiting    Pt states all pain medications cause N/V for her Pt states all pain medications cause N/V for her  . Fluocinolone Nausea And Vomiting  . Meperidine Nausea And Vomiting  . Penicillins Hives     Review of Systems: All systems reviewed and negative except where noted in HPI.     Physical Exam:    Complete physical exam not completed due to the nature of this telehealth communication.   Gen: Awake, alert, and oriented, and well communicative. HEENT: EOMI, non-icteric sclera, NCAT, MMM Neck: Normal movement of head and neck Pulm: No labored breathing, speaking in full sentences without conversational dyspnea Derm: No apparent lesions or bruising in visible field MS: Moves all visible extremities without noticeable abnormality Psych: Pleasant, cooperative, normal speech, thought processing seemingly intact   ASSESSMENT AND PLAN;   1) GERD 2) Hiatal hernia  68 year old female with longstanding history of GERD complicated by 5 cm hiatal hernia.  No erosive esophagitis noted on recent EGD.  Given her ongoing reflux symptoms despite high-dose acid suppression therapy with both PPI and H2 RA, significant lifestyle/dietary modifications, recommended laparoscopic hiatal hernia repair with concomitant TIF, which she would eagerly like to pursue.  Plan for the following:  -Barium esophagram to evaluate for reflux along with preserved motility -Depending on esophagram results, +/-need for EM/pH/impedance testing  -Has an appointment set up with Dr. Kae Heller at [facility] later this week to further discuss concomitant hiatal hernia repair/TIF  3) Dysphagia: Very rare occurrence of solid food dysphagia, occurring less than once/month.  Can evaluate esophageal motility at time of esophagram as above.  Otherwise, do not feel that her symptoms are consistent with need for EM at this juncture.  Can certainly order, if requested by consulting surgeon.  4) Vascular disease 5) Chronic antiplatelet therapy  History of significant carotid disease with stents placed in the right innominate artery in 2007, left common carotid artery 2006, and right CEA in 2008.  Follows with Dr. Irish Lack.  Normal EF on nuclear stress test in 01/2019.  Normal perfusion.  - Request clearance to hold Plavix 5 days prior to surgery  I spent a total of 25 minutes of virtual face-to-face time with the patient. Greater than 50% of the time was spent counseling and coordinating care.     Lavena Bullion, DO, FACG  03/19/2019, 8:19 AM   CC: Havery Moros Reginia Forts, MD Tammi Sou, MD

## 2019-03-19 NOTE — Patient Instructions (Signed)
If you are age 68 or older, your body mass index should be between 23-30. Your Body mass index is 24.98 kg/m. If this is out of the aforementioned range listed, please consider follow up with your Primary Care Provider.  If you are age 54 or younger, your body mass index should be between 19-25. Your Body mass index is 24.98 kg/m. If this is out of the aformentioned range listed, please consider follow up with your Primary Care Provider.   To help prevent the possible spread of infection to our patients, communities, and staff; we will be implementing the following measures:  As of now we are not allowing any visitors/family members to accompany you to any upcoming appointments with [facility]. If you have any concerns about this please contact our office to discuss prior to the appointment.   You have been scheduled for a Barium Esophogram at [facility] (1st floor of the hospital) on 03/28/2019 at 10:30am. Please arrive 15 minutes prior to your appointment for registration. Make certain not to have anything to eat or drink 3 hours prior to your test. If you need to reschedule for any reason, please contact radiology at [phone] to do so. __________________________________________________________________ A barium swallow is an examination that concentrates on views of the esophagus. This tends to be a double contrast exam (barium and two liquids which, when combined, create a gas to distend the wall of the oesophagus) or single contrast (non-ionic iodine based). The study is usually tailored to your symptoms so a good history is essential. Attention is paid during the study to the form, structure and configuration of the esophagus, looking for functional disorders (such as aspiration, dysphagia, achalasia, motility and reflux) EXAMINATION You may be asked to change into a gown, depending on the type of swallow being performed. A radiologist and radiographer will perform the  procedure. The radiologist will advise you of the type of contrast selected for your procedure and direct you during the exam. You will be asked to stand, sit or lie in several different positions and to hold a small amount of fluid in your mouth before being asked to swallow while the imaging is performed .In some instances you may be asked to swallow barium coated marshmallows to assess the motility of a solid food bolus. The exam can be recorded as a digital or video fluoroscopy procedure. POST PROCEDURE It will take 1-2 days for the barium to pass through your system. To facilitate this, it is important, unless otherwise directed, to increase your fluids for the next 24-48hrs and to resume your normal diet.  This test typically takes about 30 minutes to perform. __________________________________________________________________________________  It was a pleasure to see you today!  Vito Cirigliano, D.O.

## 2019-03-21 DIAGNOSIS — K219 Gastro-esophageal reflux disease without esophagitis: Secondary | ICD-10-CM | POA: Diagnosis not present

## 2019-03-21 DIAGNOSIS — K449 Diaphragmatic hernia without obstruction or gangrene: Secondary | ICD-10-CM | POA: Diagnosis not present

## 2019-03-22 ENCOUNTER — Telehealth: Payer: Self-pay | Admitting: Gastroenterology

## 2019-03-22 NOTE — Telephone Encounter (Signed)
See prior documentation for patient message sent through my chart.  She met with Dr. Windle Guard at [place] yesterday, and is now worried about [facility] repair, and she was told this was an elective surgery and concerned about the operating field being close to the diaphragm, heart, lung.  We discussed her GERD, hiatal hernia, history of Cameron's ulcer at length.  I explained that yes this is certainly considered an elective surgery, with a goal of improvement in QOL as well as stopping or significantly reducing the need to take acid suppression therapy.  I understand that her surgical concerns stem mostly from her mother having a complication during surgery and dying in the OR.  I explained that we can certainly continue to treat her reflux with acid suppression therapy as she is currently doing.  She explained that she is inclined to undergo surgery, but was very much scared about the surgeons explanation yesterday.  She is scheduled for an upper GI series next week, and we can discuss further regarding those results and whether or not she like to proceed.  Explained that there is no rush to the surgery and that she is currently on the correct therapy for both her reflux as well as the noted Cameron's ulcer at her recent EGD.  All questions answered and she was very appreciative for the phone call.

## 2019-03-25 ENCOUNTER — Telehealth: Payer: Self-pay | Admitting: Interventional Cardiology

## 2019-03-25 NOTE — Telephone Encounter (Signed)
It appears patient is on plavix for carotid endarterectomy (2008), innominate artery stent (2007), and left common carotid artery stent (2006) followed by Dr. Trula Slade.   This clearance is a request to hold ASA. Can you comment on holding ASA, or should this also be directed to [facility]?

## 2019-03-25 NOTE — Telephone Encounter (Signed)
   [facility] Pre-operative Risk Assessment    Request for surgical clearance:  1. What type of surgery is being performed? Lapro hiatal hernia repair   2. When is this surgery scheduled? TBD   3. What type of clearance is required (medical clearance vs. Pharmacy clearance to hold med vs. Both)? Both  4. Are there any medications that need to be held prior to surgery and how long? ASA   5. Practice name and name of physician performing surgery? [facility]- Dr. Kae Heller   6. What is your office phone number? [phone]    7.   What is your office fax number? [phone] Attn: Andreas Blower, CMA  8.   Anesthesia type (None, local, MAC, general) ? general  _________________________________________________________________   (provider comments below)

## 2019-03-26 NOTE — Telephone Encounter (Signed)
   Primary Cardiologist: Larae Grooms, MD  Chart reviewed as part of pre-operative protocol coverage. Patient was contacted 03/26/2019 in reference to pre-operative risk assessment for pending surgery as outlined below.  [name] was last seen on 02/13/19 by Dr. Irish Lack.  Since that day, [name] has done well.  She can complete more than 4.0 METS without anginal symptoms. She has SOB attributed to her hernia near her diaphragm.   Per Dr. Irish Lack, [place] to hold ASA for 7 days prior to procedure.  Therefore, based on ACC/AHA guidelines, the patient would be at acceptable risk for the planned procedure without further cardiovascular testing.   She is unsure if she will have the surgery. This clearance is good for two months.  I will route this recommendation to the requesting party via Epic fax function and remove from pre-op pool.  Please call with questions.  Tami Lin Shalena Ezzell, PA 03/26/2019, 3:36 PM

## 2019-03-26 NOTE — Telephone Encounter (Signed)
OK to hold aspirin 7 days prior to procedure

## 2019-03-28 ENCOUNTER — Ambulatory Visit (HOSPITAL_COMMUNITY)
Admission: RE | Admit: 2019-03-28 | Discharge: 2019-03-28 | Disposition: A | Discharge status: Home / Self Care | Payer: PPO | Source: Ambulatory Visit | Attending: Gastroenterology | Admitting: Gastroenterology

## 2019-03-28 ENCOUNTER — Other Ambulatory Visit: Payer: Self-pay

## 2019-03-28 ENCOUNTER — Other Ambulatory Visit: Payer: Self-pay | Admitting: Family Medicine

## 2019-03-28 DIAGNOSIS — K449 Diaphragmatic hernia without obstruction or gangrene: Secondary | ICD-10-CM | POA: Diagnosis not present

## 2019-03-28 DIAGNOSIS — K219 Gastro-esophageal reflux disease without esophagitis: Secondary | ICD-10-CM | POA: Insufficient documentation

## 2019-04-04 NOTE — Telephone Encounter (Signed)
Juliann Pulse, referral coordinator from [facility], called the office today concerning the upcoming surgery for hiatal hernia and TIF; patient is currently scheduled on 05/31/2019 with Dr. Bryan Lemma starting his portion of the case at 10:00 (as case is posted to start at 7:30 am);  Spoke with Dr.Cirigliano =he does not feel this date is appropriate as he is not in town starting the day following the surgery; will need to reschedule this case on an alternative date/time;  Dr. Bryan Lemma states he is going to speak with Dr. Redmond Pulling to see if a second opinion can be done for this patient; will update as information becomes available;

## 2019-04-10 NOTE — Telephone Encounter (Signed)
Nothing new at this time. Plan to coordinate for joint HH repair/TIF when able. Thanks.

## 2019-04-10 NOTE — Telephone Encounter (Signed)
Is there any additional information on this patient- when are we able to schedule her? Please let me know if there is anything I need to do for this patient

## 2019-04-11 ENCOUNTER — Telehealth: Payer: Self-pay

## 2019-04-11 NOTE — Telephone Encounter (Signed)
Left message for Juliann Pulse to call back to the office once the 2nd opinion from Dr. Redmond Pulling has been completed;

## 2019-04-11 NOTE — Telephone Encounter (Signed)
[facility] Pre-operative Risk Assessment     Request for surgical clearance:     Endoscopy Procedure  What type of surgery is being performed?     HH/TIF  When is this surgery scheduled?     Not yet scheduled  What type of clearance is required ?   Pharmacy  Are there any medications that need to be held prior to surgery and how long? Plavix 5 days  Practice name and name of physician performing surgery?      [facility] [place]  What is your office phone and fax number?      Phone- [phone]  [phone]  Anesthesia type (None, local, MAC, general) ?       MAC

## 2019-04-11 NOTE — Telephone Encounter (Signed)
OK to hold Plavix 5 days prior

## 2019-04-11 NOTE — Telephone Encounter (Signed)
Dr Irish Lack I know you cleared this patient to hold aspirin for surgery.  Gi now wants to hold Plavix for 5 days for an endoscopy.  Are you OK with this ?  Kerin Ransom PA-C 04/11/2019 5:04 PM

## 2019-04-11 NOTE — Telephone Encounter (Signed)
Left a message for Juliann Pulse at [facility] to touch base with her on the progress of getting this patient scheduled for [facility] repair with TIF with Dr. Redmond Pulling;

## 2019-04-12 NOTE — Telephone Encounter (Signed)
   Primary Cardiologist: Larae Grooms, MD  Chart reviewed as part of pre-operative protocol coverage. Given past medical history and time since last visit, based on ACC/AHA guidelines, [place] would be at acceptable risk for the planned procedure without further cardiovascular testing.   OK to hold Plavix 5 days pre op if needed.  I will route this recommendation to the requesting party via Epic fax function and remove from pre-op pool.  Please call with questions.  Kerin Ransom, PA-C 04/12/2019, 8:17 AM

## 2019-04-16 ENCOUNTER — Encounter: Payer: Self-pay | Admitting: Family Medicine

## 2019-04-17 NOTE — Telephone Encounter (Signed)
Per Dr. Bryan Lemma, Dr. Redmond Pulling has reviewed this patient's chart and will update the office once a decision is made concerning this patient's care;

## 2019-04-23 ENCOUNTER — Encounter: Payer: Self-pay | Admitting: Family Medicine

## 2019-04-23 DIAGNOSIS — R0609 Other forms of dyspnea: Secondary | ICD-10-CM

## 2019-04-23 NOTE — Telephone Encounter (Signed)
I have entered referral order for pulmonology.

## 2019-05-02 ENCOUNTER — Telehealth: Payer: Self-pay

## 2019-05-07 NOTE — Telephone Encounter (Signed)
error 

## 2019-05-17 ENCOUNTER — Encounter: Payer: Self-pay | Admitting: Family Medicine

## 2019-05-24 NOTE — Telephone Encounter (Signed)
Dr. Bryan Lemma, Please get update on status for surgery from Dr. Redmond Pulling on this patient-there is potential for this patient to be scheduled for her sx in October-RN needs clearance for scheduling this patient

## 2019-05-25 DIAGNOSIS — R0609 Other forms of dyspnea: Secondary | ICD-10-CM | POA: Insufficient documentation

## 2019-05-28 NOTE — Telephone Encounter (Signed)
Is there an update on this patient so we can get her scheduled for her procedure?

## 2019-05-29 ENCOUNTER — Encounter: Payer: Self-pay | Admitting: Internal Medicine

## 2019-05-29 ENCOUNTER — Other Ambulatory Visit: Payer: Self-pay | Admitting: Family Medicine

## 2019-05-29 ENCOUNTER — Telehealth: Payer: Self-pay | Admitting: Internal Medicine

## 2019-05-29 ENCOUNTER — Other Ambulatory Visit: Payer: Self-pay

## 2019-05-29 ENCOUNTER — Ambulatory Visit: Payer: PPO | Admitting: Internal Medicine

## 2019-05-29 VITALS — BP 124/82 | HR 80 | Temp 98.2°F | Ht 64.0 in | Wt 146.0 lb

## 2019-05-29 DIAGNOSIS — Z122 Encounter for screening for malignant neoplasm of respiratory organs: Secondary | ICD-10-CM | POA: Diagnosis not present

## 2019-05-29 DIAGNOSIS — Z87891 Personal history of nicotine dependence: Secondary | ICD-10-CM

## 2019-05-29 DIAGNOSIS — R0609 Other forms of dyspnea: Secondary | ICD-10-CM | POA: Diagnosis not present

## 2019-05-29 MED ORDER — FLUTICASONE FUROATE-VILANTEROL 100-25 MCG/INH IN AEPB: 1.0000 | INHALATION_SPRAY | Freq: Every day | RESPIRATORY_TRACT | 5 refills | Status: DC

## 2019-05-29 MED ORDER — BREO ELLIPTA 100-25 MCG/INH IN AEPB: 1.0000 | INHALATION_SPRAY | Freq: Every day | RESPIRATORY_TRACT | 0 refills | Status: DC

## 2019-05-29 NOTE — Telephone Encounter (Signed)
Patient needs CPE-fasting scheduled please. Thank you

## 2019-05-29 NOTE — Patient Instructions (Addendum)
Thank you for visiting Dr. Tamala Julian at [facility].  - It was nice to meet you! - Try the breo inhaler, if it helps, fill the prescription; if not don't fill it.  If it's too expensive let me know.  Rinse out mouth after using this med. - Lung function tests - CT lung cancer screening, I will call you if there's something we need to do urgently  Today we recommend the following: Orders Placed This Encounter  Procedures  . CT CHEST LUNG CA SCREEN LOW DOSE W/O CM  . Pulmonary function test   Meds ordered this encounter  Medications  . fluticasone furoate-vilanterol (BREO ELLIPTA) 100-25 MCG/INH AEPB    Sig: Inhale 1 puff into the lungs daily.    Dispense:  30 each    Refill:  5   Return in about 1 month (around 06/28/2019).    Please do your part to reduce the spread of COVID-19.

## 2019-05-29 NOTE — Progress Notes (Signed)
   Subjective:    Patient ID: [name], female    DOB: [birth year], 68 y.o.   MRN: [id number]  HPI    Review of Systems  Constitutional: Negative for fever and unexpected weight change.  HENT: Negative for congestion, dental problem, ear pain, nosebleeds, postnasal drip, rhinorrhea, sinus pressure, sneezing, sore throat and trouble swallowing.   Eyes: Negative for redness and itching.  Respiratory: Positive for cough and shortness of breath. Negative for chest tightness and wheezing.   Cardiovascular: Negative for palpitations and leg swelling.  Gastrointestinal: Negative for nausea and vomiting.  Genitourinary: Negative for dysuria.  Musculoskeletal: Negative for joint swelling.  Skin: Negative for rash.  Allergic/Immunologic: Negative.  Negative for environmental allergies, food allergies and immunocompromised state.  Neurological: Negative for headaches.  Hematological: Does not bruise/bleed easily.  Psychiatric/Behavioral: Negative for dysphoric mood. The patient is not nervous/anxious.        Objective:   Physical Exam        Assessment & Plan:

## 2019-05-29 NOTE — Telephone Encounter (Signed)
Patient scheduled first available CPE on 06/27/19. Patient has enough Lisinopril to make it to 06/10/19.

## 2019-05-29 NOTE — Progress Notes (Signed)
Synopsis: Referred in Sept 2020 for DOE by Tammi Sou, MD  Subjective:   PATIENT ID: [name] GENDER: female DOB: 07-07-1951, MRN: [id number]  Chief Complaint  Patient presents with  . Consult    Referred by Dr. Anitra Lauth for DOE and hoarseness since March 13th, 2020. Concerned about hiatal hernia as well.     Patient with history of PVD, carotid artery disease, seasonal allergies, prediabetes, hiatal hernia, previous smoking presenting for evaluation of DOE. Started around March 2020. Given Rx for abx/inhaler, some improvement but not much. Had some associated chest tightness prompting a cardiac evaluation.  She underwent a nuclear stress test which was normal. Regarding her smoking, she smoked from age 89-2009, about 1.5 packs a day.  This equates to about a 45-pack-year smoking history. Current mMRC 1.  She uses albuterol inhaler 1 puff instead of 2, she feels this helps somewhat.  She is afraid to use more than 1 puff as it makes her heart race. She has no family history of lung disease. She has a fairly symptomatic hiatal hernia that requires her to eat very small meals.  She has been told she will eventually need this surgically repaired.  She is afraid to do this as her husband and father both died in the operating room.  She is on a PPI. She has some mild seasonal allergies and congestion.  Sputum production is clear.  Again her exertional dyspnea is not really associated with a cough, more of a tightness. I note that in 2009 when she was being worked up for her carotid artery disease she had a CTA of the chest which demonstrated moderate emphysema.  We have no PFTs on file.  Other than the ER visit in March 2020 she has never been hospitalized for her breathing.    Past Medical History:  Diagnosis Date  . Abdominal bruit 04/2017   Aortic u/s showed NO ANEURISM.  Marland Kitchen Allergy    SEASONAL  . Anxiety   . BCC (basal cell carcinoma), arm, right   . Carotid artery  occlusion   . Cataract   . Colon cancer screening 05/2017   Colonoscopy reportedly normal approx 2008.  Cologuard NEG--repeat 3 yrs.  . Essential hypertension, benign 05/22/2014  . GERD (gastroesophageal reflux disease)   . H/O hiatal hernia 02/2019   EGD-->sugery recommended  . Melanoma ([place])   . Mitral valve prolapse   . Mixed hyperlipidemia 05/22/2014  . Osteopenia 06/06/2016   05/2016 DEXA T-score -1.5.  06/2018 DEXA T score -1.1  . Peripheral vascular disease, unspecified ([place]) 05/22/2014   innominate and carotid.  U/S f/u 12/2016 showed no signif change compared to 2017--vascular recommended repeat 1 yr.  Doing well as of 08/2018 cardiol f/u-->continued on ASA and Plavix.  . Prediabetes 2018   2018 A1c 6.3%.  05/2018 A1c 6.1%.  . Reflux esophagitis   . SOB (shortness of breath)    maily on exertion/since 11/2018.  Stress test neg 01/2019.  Improving as of cardiology f/u 01/2019.  Marland Kitchen Stroke [facility]) 2006   Mini  Stroke- not major  . Tobacco abuse, in remission    quit 2009     Family History  Problem Relation Age of Onset  . Heart attack Mother   . CAD Mother   . Heart disease Mother        Before age 6  . Hypertension Mother   . Hyperlipidemia Mother   . Hypertension Father   . Pneumonia Father   .  Hyperlipidemia Father   . Heart murmur Sister   . Osteoporosis Paternal Grandmother   . CAD Paternal Grandfather   . Breast cancer Neg Hx   . Colon cancer Neg Hx   . Esophageal cancer Neg Hx   . Rectal cancer Neg Hx   . Stomach cancer Neg Hx   . Colon polyps Neg Hx      Past Surgical History:  Procedure Laterality Date  . ABDOMINAL HYSTERECTOMY  1997  . CARDIAC CATHETERIZATION  01/2012   NORMAL  . CARDIOVASCULAR STRESS TEST  02/05/2019   NORMAL.  EF normal.  . CAROTID ENDARTERECTOMY  08/21/2007   right - Dr. Amedeo Plenty  . CHOLECYSTECTOMY  2000  . COLONOSCOPY  approx 2008   (Dr. Lenon Ahmadi pt->normal  . DEXA  06/06/2016   05/2016 T-score -1.5.  06/2018 T score -1.1.  Repeat  2 yrs.  . ESOPHAGOGASTRODUODENOSCOPY  03/11/2019   5 cm hiatal hernia; GI suggested surgical repair due to intractable GER  . Incision and drainage of left thenar space abscess  02/23/15   s/p cat bite (Dr. Amedeo Plenty)  . innominate stent  2007   12/2014 u/s showed patent stent in innominate and L common carotid.  L prox int carotid 40-59% stenosis  . Left common carotid stent  2006 and 2007   "                    "                  "                          "                             "  . TRANSTHORACIC ECHOCARDIOGRAM  2007   NORMAL    Social History   Socioeconomic History  . Marital status: Married    Spouse name: Not on file  . Number of children: Not on file  . Years of education: Not on file  . Highest education level: Not on file  Occupational History  . Not on file  Social Needs  . Financial resource strain: Not on file  . Food insecurity    Worry: Not on file    Inability: Not on file  . Transportation needs    Medical: Not on file    Non-medical: Not on file  Tobacco Use  . Smoking status: Former Smoker    Types: Cigarettes    Quit date: 08/06/2002    Years since quitting: 16.8  . Smokeless tobacco: Never Used  Substance and Sexual Activity  . Alcohol use: No  . Drug use: No  . Sexual activity: Not on file  Lifestyle  . Physical activity    Days per week: Not on file    Minutes per session: Not on file  . Stress: Not on file  Relationships  . Social Herbalist on phone: Not on file    Gets together: Not on file    Attends religious service: Not on file    Active member of club or organization: Not on file    Attends meetings of clubs or organizations: Not on file    Relationship status: Not on file  . Intimate partner violence    Fear of current or ex partner: Not on file    Emotionally  abused: Not on file    Physically abused: Not on file    Forced sexual activity: Not on file  Other Topics Concern  . Not on file  Social History Narrative    Widowed as of January 18, 2015 (husband died of viral illness while out of town in [place], [place]), has one daughter.   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   Tobacco: 20 pack-yr hx, quit 01/18/2008.   Alcohol: rare.   No hx of alc or drug problems.   Exercise: walks 2.40miles on treadmill daily.     Allergies  Allergen Reactions  . Doxycycline Shortness Of Breath and Swelling    Lips swelling and short of breath  . Fentanyl Nausea And Vomiting    Pt states all pain medications cause N/V for her Pt states all pain medications cause N/V for her  . Fluocinolone Nausea And Vomiting  . Meperidine Nausea And Vomiting  . Penicillins Hives     Outpatient Medications Prior to Visit  Medication Sig Dispense Refill  . aspirin EC 81 MG tablet Take 1 tablet (81 mg total) by mouth daily. 90 tablet 3  . atorvastatin (LIPITOR) 80 MG tablet TAKE ONE TABLET BY MOUTH DAILY 30 tablet 5  . cholecalciferol (VITAMIN D) 1000 UNITS tablet Take 1,000 Units by mouth daily.    . clopidogrel (PLAVIX) 75 MG tablet TAKE ONE TABLET BY MOUTH DAILY 30 tablet 3  . diazepam (VALIUM) 2 MG tablet TAKE ONE TABLET BY MOUTH TWICE DAILY 60 tablet 5  . famotidine (PEPCID) 40 MG tablet TAKE ONE TABLET BY MOUTH DAILY WITH LUNCH 30 tablet 1  . fexofenadine (ALLEGRA) 180 MG tablet Take 180 mg by mouth daily. Take 1 tablet daily for allergies    . lisinopril (PRINIVIL,ZESTRIL) 5 MG tablet TAKE ONE TABLET BY MOUTH EVERY DAY 30 tablet 5  . Omega-3 Fatty Acids (FISH OIL) 1000 MG CAPS Take 2,000 mg by mouth daily.     . pantoprazole (PROTONIX) 40 MG tablet TAKE ONE TABLET BY MOUTH TWICE DAILY 60 tablet 3  . fluticasone (FLONASE) 50 MCG/ACT nasal spray Place 2 sprays into both nostrils daily. 16 g 2   No facility-administered medications prior to visit.     Review of Systems  Constitutional: Negative for chills, fever and weight loss.  HENT: Positive for congestion and sore throat. Negative for  sinus pain.   Eyes: Negative for blurred vision and double vision.  Respiratory: Negative for cough, hemoptysis, sputum production, shortness of breath, wheezing and stridor.   Cardiovascular: Negative for chest pain and leg swelling.  Gastrointestinal: Positive for heartburn and nausea. Negative for diarrhea and vomiting.  Genitourinary: Negative for dysuria and frequency.  Musculoskeletal: Negative for back pain and joint pain.  Skin: Negative for itching and rash.  Neurological: Negative for dizziness and headaches.  Endo/Heme/Allergies: Negative for environmental allergies. Does not bruise/bleed easily.  Psychiatric/Behavioral: Negative for depression. The patient is nervous/anxious.      Objective:    Vitals:   05/29/19 0901  BP: 124/82  Pulse: 80  Temp: 98.2 F (36.8 C)  TempSrc: Oral  SpO2: 98%  Weight: 146 lb (66.2 kg)  Height: 5\' 4"  (1.626 m)   98% on RA BMI Readings from Last 3 Encounters:  05/29/19 25.06 kg/m  03/19/19 24.98 kg/m  03/11/19 25.06 kg/m   Wt Readings from Last 3 Encounters:  05/29/19 146 lb (66.2 kg)  03/19/19 145 lb 8 oz (66 kg)  03/11/19 146 lb (66.2 kg)   GEN: Thin elderly woman in no acute distress HEENT: Mucous membranes moist, no thrush CV: Heart sounds are regular, she has a soft systolic murmur she has been told is chronic, extremities are warm PULM: Lungs are clear to auscultation bilaterally, there is no accessory muscle use GI: Soft, positive bowel sounds EXT: She has no edema or clubbing NEURO: She moves all 4 extremities to command, normal ambulation PSYCH: Alert and oriented x3, excellent insight, a bit anxious SKIN: No rashes   CBC    Component Value Date/Time   WBC 6.3 01/30/2019 1056   RBC 4.52 01/30/2019 1056   HGB 12.9 01/30/2019 1056   HCT 39.0 01/30/2019 1056   PLT 364.0 01/30/2019 1056   MCV 86.5 01/30/2019 1056   MCH 28.5 02/23/2015 0800   MCHC 33.2 01/30/2019 1056   RDW 15.2 01/30/2019 1056   LYMPHSABS  2.7 01/30/2019 1056   MONOABS 0.5 01/30/2019 1056   EOSABS 0.2 01/30/2019 1056   BASOSABS 0.0 01/30/2019 1056    Chest Imaging: 2013 CTA Chest aortic protocol IMPRESSION: 1.  Wide patency of both the right innominate and left common carotid artery stents without hemodynamically significant stenosis. (Please refer to separate dictation regarding CTA of the neck). 2.  Normal caliber of the thoracic aorta without evidence of dissection. 3.  Extensive centrilobular emphysema.  Pulmonary Functions Testing Results: No flowsheet data found.     Assessment & Plan:   DOE-she is emphysema on her chest imaging.  Noninvasive cardiac work-up negative to date.  Her chest tightness could be a bronchospasm equivalent.  She does have some benefits albuterol.  I think it is reasonable to give her a trial of controller medicine to see if her symptoms improve as well as her quality of life.  We will also get baseline pulmonary function test including lung volumes and DLCO to determine if she has the formal diagnosis of COPD. - PFTs - Breo trial, patient instructed to fill the prescription only if it is helping her and if the co-pay is not too high.  She was instructed to call us if this is the case.  She will rinse her mouth out after each inhalation.  History of smoking-congratulated on stopping smoking.  Given her history and performance status, she qualifies for low-dose lung cancer screening.  We discussed the risks and benefits of this and she agrees to proceed. - LDCT, review at next visit unless urgent    Current Outpatient Medications:  .  aspirin EC 81 MG tablet, Take 1 tablet (81 mg total) by mouth daily., Disp: 90 tablet, Rfl: 3 .  atorvastatin (LIPITOR) 80 MG tablet, TAKE ONE TABLET BY MOUTH DAILY, Disp: 30 tablet, Rfl: 5 .  cholecalciferol (VITAMIN D) 1000 UNITS tablet, Take 1,000 Units by mouth daily., Disp: , Rfl:  .  clopidogrel (PLAVIX) 75 MG tablet, TAKE ONE TABLET BY MOUTH DAILY,  Disp: 30 tablet, Rfl: 3 .  diazepam (VALIUM) 2 MG tablet, TAKE ONE TABLET BY MOUTH TWICE DAILY, Disp: 60 tablet, Rfl: 5 .  famotidine (PEPCID) 40 MG tablet, TAKE ONE TABLET BY MOUTH DAILY WITH LUNCH, Disp: 30 tablet, Rfl: 1 .  fexofenadine (ALLEGRA) 180 MG tablet, Take 180 mg by mouth daily. Take 1 tablet daily for allergies, Disp: , Rfl:  .  lisinopril (PRINIVIL,ZESTRIL) 5 MG tablet, TAKE ONE TABLET BY MOUTH EVERY DAY, Disp: 30 tablet, Rfl: 5 .  Omega-3 Fatty Acids (FISH OIL) 1000 MG CAPS, Take 2,000 mg  by mouth daily. , Disp: , Rfl:  .  pantoprazole (PROTONIX) 40 MG tablet, TAKE ONE TABLET BY MOUTH TWICE DAILY, Disp: 60 tablet, Rfl: 3 .  fluticasone furoate-vilanterol (BREO ELLIPTA) 100-25 MCG/INH AEPB, Inhale 1 puff into the lungs daily., Disp: 30 each, Rfl: Clifford, MD [facility] 05/29/2019 9:46 AM '

## 2019-05-29 NOTE — Telephone Encounter (Signed)
Patient was seen today by Dr. Tamala Julian. He would like for the patient to return to clinic in 4 weeks with an OV and PFT.   Will route to [facility] for follow up on the covid testing portion of the test.

## 2019-05-29 NOTE — Addendum Note (Signed)
Addended by: Valerie Salts on: 05/29/2019 10:14 AM   Modules accepted: Orders

## 2019-05-29 NOTE — Addendum Note (Signed)
Addended by: Valerie Salts on: 05/29/2019 10:18 AM   Modules accepted: Orders

## 2019-06-04 ENCOUNTER — Telehealth: Payer: Self-pay | Admitting: Internal Medicine

## 2019-06-04 MED ORDER — SPIRIVA HANDIHALER 18 MCG IN CAPS: 18.0000 ug | ORAL_CAPSULE | Freq: Every day | RESPIRATORY_TRACT | 2 refills | Status: DC

## 2019-06-04 NOTE — Telephone Encounter (Signed)
Spoke with patient. She stated that she tried to [facility] 100 that was given to her after her OV with Dr. Tamala Julian on 9/2. She is not tolerating the medication well. About 1-2 hours after inhalation, she starts to feel extremely weak and light headed. She also stated that she her legs get so weak to the point she feels like she will pass out. Advised her to not take anymore of the Breo until she hears back from [place], she verbalized understanding. Will also add Breo to her allergy list.   She wants to know if there is a milder inhaler that she can use. Advised her that I would send a message over to Dr. Tamala Julian for her.   Dr. Tamala Julian, please advise. Thank you!

## 2019-06-04 NOTE — Telephone Encounter (Signed)
Intolerant to breo Intolerant to prednisone Can try a LAMA, sent to her pharmacy Please see what is covered under her plan if this is too expensive  Erskine Emery MD

## 2019-06-04 NOTE — Telephone Encounter (Signed)
Left message for patient to call back. Will need to verify that her insurance info is up to date in her chart (listed as [employer]).

## 2019-06-05 NOTE — Telephone Encounter (Addendum)
Was able to review her formulary online. Spiriva is covered as a tier 3 medication.   Spoke with patient. She is aware that the Spiriva has been called in. Explained to patient how to use the handihaler, she verbalized understanding.   Nothing further needed at time of call.

## 2019-06-05 NOTE — Addendum Note (Signed)
Addended by: Valerie Salts on: 06/05/2019 02:05 PM   Modules accepted: Orders

## 2019-06-11 ENCOUNTER — Encounter: Payer: Self-pay | Admitting: Family Medicine

## 2019-06-11 ENCOUNTER — Other Ambulatory Visit: Payer: Self-pay

## 2019-06-11 ENCOUNTER — Ambulatory Visit (INDEPENDENT_AMBULATORY_CARE_PROVIDER_SITE_OTHER): Payer: PPO

## 2019-06-11 DIAGNOSIS — Z23 Encounter for immunization: Secondary | ICD-10-CM

## 2019-06-15 ENCOUNTER — Encounter: Payer: Self-pay | Admitting: Family Medicine

## 2019-06-17 DIAGNOSIS — H1851 Endothelial corneal dystrophy: Secondary | ICD-10-CM | POA: Diagnosis not present

## 2019-06-17 DIAGNOSIS — H43812 Vitreous degeneration, left eye: Secondary | ICD-10-CM | POA: Diagnosis not present

## 2019-06-17 DIAGNOSIS — H2512 Age-related nuclear cataract, left eye: Secondary | ICD-10-CM | POA: Diagnosis not present

## 2019-06-17 DIAGNOSIS — Z961 Presence of intraocular lens: Secondary | ICD-10-CM | POA: Diagnosis not present

## 2019-06-17 NOTE — Telephone Encounter (Signed)
Noted. Patrice aware PFT needs scheduling.  

## 2019-06-17 NOTE — Telephone Encounter (Signed)
Pt scheduled on 07/05/2019-pr

## 2019-06-18 ENCOUNTER — Other Ambulatory Visit: Payer: Self-pay | Admitting: Family Medicine

## 2019-06-25 ENCOUNTER — Other Ambulatory Visit: Payer: Self-pay | Admitting: Family Medicine

## 2019-06-25 NOTE — Telephone Encounter (Signed)
Pt has appt on 10/1 and has enough until 10/6.

## 2019-06-27 ENCOUNTER — Other Ambulatory Visit: Payer: Self-pay

## 2019-06-27 ENCOUNTER — Encounter: Payer: Self-pay | Admitting: Family Medicine

## 2019-06-27 ENCOUNTER — Other Ambulatory Visit: Payer: Self-pay | Admitting: Internal Medicine

## 2019-06-27 ENCOUNTER — Telehealth: Payer: Self-pay | Admitting: Internal Medicine

## 2019-06-27 ENCOUNTER — Encounter: Payer: PPO | Admitting: Family Medicine

## 2019-06-27 HISTORY — PX: US CAROTID DOPPLER BILATERAL (ARMC HX): HXRAD1402

## 2019-06-27 HISTORY — PX: OTHER SURGICAL HISTORY: SHX169

## 2019-06-27 MED ORDER — PANTOPRAZOLE SODIUM 40 MG PO TBEC: 40.0000 mg | DELAYED_RELEASE_TABLET | Freq: Two times a day (BID) | ORAL | 1 refills | Status: DC

## 2019-06-27 MED ORDER — ATORVASTATIN CALCIUM 80 MG PO TABS: 80.0000 mg | ORAL_TABLET | Freq: Every day | ORAL | 1 refills | Status: DC

## 2019-06-27 MED ORDER — CLOPIDOGREL BISULFATE 75 MG PO TABS: 75.0000 mg | ORAL_TABLET | Freq: Every day | ORAL | 1 refills | Status: DC

## 2019-06-27 NOTE — Telephone Encounter (Signed)
Call returned to patient, she reports she is coming in next week for PFT and Chest CT. She states she saw her PCP today and she had to fill out a lot of information and one of the pre-checked boxes said she has COPD. I made her aware we have no way of filling out any consult paperwork at her PCP but per documentation for Dr. Tamala Julian we are obtaining PFT to determine if she has COPD. Patient was very thankful for the call and states she feels much better now. Nothing further needed at this time.

## 2019-07-01 ENCOUNTER — Other Ambulatory Visit: Payer: Self-pay

## 2019-07-01 ENCOUNTER — Ambulatory Visit (INDEPENDENT_AMBULATORY_CARE_PROVIDER_SITE_OTHER)
Admission: RE | Admit: 2019-07-01 | Discharge: 2019-07-01 | Disposition: A | Discharge status: Home / Self Care | Payer: PPO | Source: Ambulatory Visit | Attending: Internal Medicine | Admitting: Internal Medicine

## 2019-07-01 ENCOUNTER — Other Ambulatory Visit (HOSPITAL_COMMUNITY)
Admission: RE | Admit: 2019-07-01 | Discharge: 2019-07-01 | Disposition: A | Discharge status: Home / Self Care | Payer: PPO | Source: Ambulatory Visit | Attending: Internal Medicine | Admitting: Internal Medicine

## 2019-07-01 DIAGNOSIS — Z87891 Personal history of nicotine dependence: Secondary | ICD-10-CM

## 2019-07-01 DIAGNOSIS — Z01818 Encounter for other preprocedural examination: Secondary | ICD-10-CM | POA: Diagnosis not present

## 2019-07-01 DIAGNOSIS — Z20828 Contact with and (suspected) exposure to other viral communicable diseases: Secondary | ICD-10-CM | POA: Insufficient documentation

## 2019-07-02 ENCOUNTER — Other Ambulatory Visit: Payer: Self-pay

## 2019-07-02 LAB — NOVEL CORONAVIRUS, NAA (HOSP ORDER, SEND-OUT TO REF LAB; TAT 18-24 HRS): SARS-CoV-2, NAA: NOT DETECTED

## 2019-07-02 NOTE — Progress Notes (Unsigned)
Patient has been contacted and wishes not to have the surgery at this time;

## 2019-07-04 ENCOUNTER — Ambulatory Visit: Payer: PPO | Admitting: Internal Medicine

## 2019-07-05 ENCOUNTER — Encounter: Payer: Self-pay | Admitting: Internal Medicine

## 2019-07-05 ENCOUNTER — Ambulatory Visit: Payer: PPO | Admitting: Internal Medicine

## 2019-07-05 ENCOUNTER — Ambulatory Visit (INDEPENDENT_AMBULATORY_CARE_PROVIDER_SITE_OTHER): Payer: PPO | Admitting: Internal Medicine

## 2019-07-05 ENCOUNTER — Other Ambulatory Visit: Payer: Self-pay

## 2019-07-05 VITALS — BP 128/88 | HR 93 | Temp 97.9°F | Ht 63.5 in | Wt 142.0 lb

## 2019-07-05 DIAGNOSIS — R0600 Dyspnea, unspecified: Secondary | ICD-10-CM | POA: Diagnosis not present

## 2019-07-05 DIAGNOSIS — R0609 Other forms of dyspnea: Secondary | ICD-10-CM

## 2019-07-05 DIAGNOSIS — Z87891 Personal history of nicotine dependence: Secondary | ICD-10-CM | POA: Diagnosis not present

## 2019-07-05 LAB — PULMONARY FUNCTION TEST
DL/VA % pred: 84 %
DL/VA: 3.51 ml/min/mmHg/L
DLCO unc % pred: 79 %
DLCO unc: 15.39 ml/min/mmHg
FEF 25-75 Post: 1.77 L/sec
FEF 25-75 Pre: 1.26 L/sec
FEF2575-%Change-Post: 40 %
FEF2575-%Pred-Post: 90 %
FEF2575-%Pred-Pre: 64 %
FEV1-%Change-Post: 6 %
FEV1-%Pred-Post: 83 %
FEV1-%Pred-Pre: 78 %
FEV1-Post: 1.91 L
FEV1-Pre: 1.8 L
FEV1FVC-%Change-Post: 2 %
FEV1FVC-%Pred-Pre: 97 %
FEV6-%Change-Post: 4 %
FEV6-%Pred-Post: 87 %
FEV6-%Pred-Pre: 83 %
FEV6-Post: 2.51 L
FEV6-Pre: 2.4 L
FEV6FVC-%Change-Post: 0 %
FEV6FVC-%Pred-Post: 103 %
FEV6FVC-%Pred-Pre: 103 %
FVC-%Change-Post: 4 %
FVC-%Pred-Post: 84 %
FVC-%Pred-Pre: 80 %
FVC-Post: 2.53 L
FVC-Pre: 2.43 L
Post FEV1/FVC ratio: 76 %
Post FEV6/FVC ratio: 99 %
Pre FEV1/FVC ratio: 74 %
Pre FEV6/FVC Ratio: 99 %
RV % pred: 115 %
RV: 2.46 L
TLC % pred: 98 %
TLC: 4.91 L

## 2019-07-05 MED ORDER — SPIRIVA HANDIHALER 18 MCG IN CAPS: 18.0000 ug | ORAL_CAPSULE | Freq: Every day | RESPIRATORY_TRACT | 11 refills | Status: DC

## 2019-07-05 NOTE — Progress Notes (Signed)
Synopsis: Referred in Sept 2020 for DOE by Tammi Sou, MD  Subjective:   PATIENT ID: [name] GENDER: female DOB: January 23, 1951, MRN: [id number]  Chief Complaint  Patient presents with  . Follow-up    CT results. PFT done today.   -Here to f/u of emphysema -Last visit we gave trial of breo and ordered LDCT cancer screening which was negative - Breo made her light-headed/weak so we went with spiriva - Equivocal benefit to spiriva - Still has reflux symptoms but manageable, very worried about getting surgery   Patient with history of PVD, carotid artery disease, seasonal allergies, prediabetes, hiatal hernia, previous smoking presenting for evaluation of DOE. Started around March 2020. Given Rx for abx/inhaler, some improvement but not much. Had some associated chest tightness prompting a cardiac evaluation.  She underwent a nuclear stress test which was normal. Regarding her smoking, she smoked from age 49-2009, about 1.5 packs a day.  This equates to about a 45-pack-year smoking history. Current mMRC 1.  She uses albuterol inhaler 1 puff instead of 2, she feels this helps somewhat.  She is afraid to use more than 1 puff as it makes her heart race. She has no family history of lung disease. She has a fairly symptomatic hiatal hernia that requires her to eat very small meals.  She has been told she will eventually need this surgically repaired.  She is afraid to do this as her husband and father both died in the operating room.  She is on a PPI. She has some mild seasonal allergies and congestion.  Sputum production is clear.  Again her exertional dyspnea is not really associated with a cough, more of a tightness. I note that in 2009 when she was being worked up for her carotid artery disease she had a CTA of the chest which demonstrated moderate emphysema.  We have no PFTs on file.  Other than the ER visit in March 2020 she has never been hospitalized for her breathing.   ROS  + symptoms in bold Fevers, chills, weight loss Nausea, vomiting, diarrhea Shortness of breath, mild DOE, wheezing, cough Chest pain, palpitations, lower ext edema  Past Medical History:  Diagnosis Date  . Abdominal bruit 04/2017   Aortic u/s showed NO ANEURISM.  Marland Kitchen Allergy    SEASONAL  . Anxiety   . BCC (basal cell carcinoma), arm, right   . Carotid artery occlusion   . Cataract   . Colon cancer screening 05/2017   Colonoscopy reportedly normal approx 2008.  Cologuard NEG--repeat 3 yrs.  Marland Kitchen COPD (chronic obstructive pulmonary disease) ([place])    DOE, worse since 11/2018.  Stress test neg 01/2019.  Albut x 1 inh prn helpful some.  Pulm eval 05/29/19->Breo trial, PFTs ordered, CT lung ca screening ordered.  . Essential hypertension, benign 05/22/2014  . GERD (gastroesophageal reflux disease)   . H/O hiatal hernia 02/2019   EGD-->sugery recommended  . Melanoma ([place])   . Mitral valve prolapse   . Mixed hyperlipidemia 05/22/2014  . Osteopenia 06/06/2016   05/2016 DEXA T-score -1.5.  06/2018 DEXA T score -1.1  . Peripheral vascular disease, unspecified ([place]) 05/22/2014   innominate and carotid.  U/S f/u 12/2016 showed no signif change compared to 2017--vascular recommended repeat 1 yr.  Doing well as of 08/2018 cardiol f/u-->continued on ASA and Plavix.  . Prediabetes 2018   2018 A1c 6.3%.  05/2018 A1c 6.1%.  . Reflux esophagitis   . Stroke [facility]) 2006   Mini  Stroke- not major  . Tobacco abuse, in remission    quit 2009     Family History  Problem Relation Age of Onset  . Heart attack Mother   . CAD Mother   . Heart disease Mother        Before age 53  . Hypertension Mother   . Hyperlipidemia Mother   . Hypertension Father   . Pneumonia Father   . Hyperlipidemia Father   . Heart murmur Sister   . Osteoporosis Paternal Grandmother   . CAD Paternal Grandfather   . Breast cancer Neg Hx   . Colon cancer Neg Hx   . Esophageal cancer Neg Hx   . Rectal cancer Neg Hx   . Stomach  cancer Neg Hx   . Colon polyps Neg Hx      Past Surgical History:  Procedure Laterality Date  . ABDOMINAL HYSTERECTOMY  1997  . CARDIAC CATHETERIZATION  01/2012   NORMAL  . CARDIOVASCULAR STRESS TEST  02/05/2019   NORMAL.  EF normal.  . CAROTID ENDARTERECTOMY  08/21/2007   right - Dr. Amedeo Plenty  . CHOLECYSTECTOMY  2000  . COLONOSCOPY  approx 2008   (Dr. Lenon Ahmadi pt->normal  . DEXA  06/06/2016   05/2016 T-score -1.5.  06/2018 T score -1.1.  Repeat 2 yrs.  . ESOPHAGOGASTRODUODENOSCOPY  03/11/2019   5 cm hiatal hernia; GI suggested surgical repair due to intractable GER  . Incision and drainage of left thenar space abscess  02/23/15   s/p cat bite (Dr. Amedeo Plenty)  . innominate stent  2007   12/2014 u/s showed patent stent in innominate and L common carotid.  L prox int carotid 40-59% stenosis  . Left common carotid stent  2006 and 2007   "                    "                  "                          "                             "  . TRANSTHORACIC ECHOCARDIOGRAM  2007   NORMAL    Social History   Socioeconomic History  . Marital status: Married    Spouse name: Not on file  . Number of children: Not on file  . Years of education: Not on file  . Highest education level: Not on file  Occupational History  . Not on file  Social Needs  . Financial resource strain: Not on file  . Food insecurity    Worry: Not on file    Inability: Not on file  . Transportation needs    Medical: Not on file    Non-medical: Not on file  Tobacco Use  . Smoking status: Former Smoker    Types: Cigarettes    Quit date: 09/27/2007    Years since quitting: 11.7  . Smokeless tobacco: Never Used  Substance and Sexual Activity  . Alcohol use: No  . Drug use: No  . Sexual activity: Not on file  Lifestyle  . Physical activity    Days per week: Not on file    Minutes per session: Not on file  . Stress: Not on file  Relationships  . Social Herbalist on  phone: Not on file    Gets  together: Not on file    Attends religious service: Not on file    Active member of club or organization: Not on file    Attends meetings of clubs or organizations: Not on file    Relationship status: Not on file  . Intimate partner violence    Fear of current or ex partner: Not on file    Emotionally abused: Not on file    Physically abused: Not on file    Forced sexual activity: Not on file  Other Topics Concern  . Not on file  Social History Narrative   Widowed as of 2015-01-15 (husband died of viral illness while out of town in [place], [place]), has one daughter.   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   Tobacco: 20 pack-yr hx, quit 01-15-08.   Alcohol: rare.   No hx of alc or drug problems.   Exercise: walks 2.54miles on treadmill daily.     Allergies  Allergen Reactions  . Doxycycline Shortness Of Breath and Swelling    Lips swelling and short of breath  . Fentanyl Nausea And Vomiting    Pt states all pain medications cause N/V for her Pt states all pain medications cause N/V for her  . Fluocinolone Nausea And Vomiting  . Meperidine Nausea And Vomiting  . Penicillins Hives  . Breo Ellipta [Fluticasone Furoate-Vilanterol] Other (See Comments)    Lower extremity weakness, lightheadedness      Outpatient Medications Prior to Visit  Medication Sig Dispense Refill  . aspirin EC 81 MG tablet Take 1 tablet (81 mg total) by mouth daily. 90 tablet 3  . atorvastatin (LIPITOR) 80 MG tablet Take 1 tablet (80 mg total) by mouth daily. 30 tablet 1  . cholecalciferol (VITAMIN D) 1000 UNITS tablet Take 1,000 Units by mouth daily.    . clopidogrel (PLAVIX) 75 MG tablet Take 1 tablet (75 mg total) by mouth daily. 30 tablet 1  . diazepam (VALIUM) 2 MG tablet TAKE ONE TABLET BY MOUTH TWICE DAILY 60 tablet 5  . famotidine (PEPCID) 40 MG tablet TAKE ONE TABLET BY MOUTH DAILY WITH LUNCH 30 tablet 1  . fexofenadine (ALLEGRA) 180 MG tablet Take 180 mg by  mouth daily. Take 1 tablet daily for allergies    . lisinopril (ZESTRIL) 5 MG tablet TAKE ONE TABLET BY MOUTH DAILY 30 tablet 2  . Omega-3 Fatty Acids (FISH OIL) 1000 MG CAPS Take 2,000 mg by mouth daily.     . pantoprazole (PROTONIX) 40 MG tablet Take 1 tablet (40 mg total) by mouth 2 (two) times daily. 60 tablet 1  . tiotropium (SPIRIVA HANDIHALER) 18 MCG inhalation capsule Place 1 capsule (18 mcg total) into inhaler and inhale daily. 30 capsule 2   No facility-administered medications prior to visit.     Objective:    There were no vitals filed for this visit.   on RA BMI Readings from Last 3 Encounters:  05/29/19 25.06 kg/m  03/19/19 24.98 kg/m  03/11/19 25.06 kg/m   Wt Readings from Last 3 Encounters:  05/29/19 146 lb (66.2 kg)  03/19/19 145 lb 8 oz (66 kg)  03/11/19 146 lb (66.2 kg)   GEN: Thin elderly woman in no acute distress HEENT: Mucous membranes moist, no thrush CV: Heart sounds are regular, she has a soft systolic murmur she has been told is chronic, extremities are warm PULM: Lungs are clear to  auscultation bilaterally, there is no accessory muscle use GI: Soft, positive bowel sounds EXT: She has no edema or clubbing NEURO: She moves all 4 extremities to command, normal ambulation PSYCH: Alert and oriented x3, excellent insight, a bit anxious SKIN: No rashes   CBC    Component Value Date/Time   WBC 6.3 01/30/2019 1056   RBC 4.52 01/30/2019 1056   HGB 12.9 01/30/2019 1056   HCT 39.0 01/30/2019 1056   PLT 364.0 01/30/2019 1056   MCV 86.5 01/30/2019 1056   MCH 28.5 02/23/2015 0800   MCHC 33.2 01/30/2019 1056   RDW 15.2 01/30/2019 1056   LYMPHSABS 2.7 01/30/2019 1056   MONOABS 0.5 01/30/2019 1056   EOSABS 0.2 01/30/2019 1056   BASOSABS 0.0 01/30/2019 1056    Chest Imaging: 2013 CTA Chest aortic protocol IMPRESSION: 1.  Wide patency of both the right innominate and left common carotid artery stents without hemodynamically significant stenosis.  (Please refer to separate dictation regarding CTA of the neck). 2.  Normal caliber of the thoracic aorta without evidence of dissection. 3.  Extensive centrilobular emphysema.  Pulmonary Functions Testing Results: PFT Results Latest Ref Rng & Units 07/05/2019  FVC-Pre L 2.43  FVC-Predicted Pre % 80  FVC-Post L 2.53  FVC-Predicted Post % 84  Pre FEV1/FVC % % 74  Post FEV1/FCV % % 76  FEV1-Pre L 1.80  FEV1-Predicted Pre % 78  FEV1-Post L 1.91  DLCO UNC% % 79  DLCO COR %Predicted % 84  TLC L 4.91  TLC % Predicted % 98  RV % Predicted % 115       Assessment & Plan:  DOE- probably related to emphysema and deconditioning, not much obstruction on PFTs so she does not meet definition of COPD  Former smoker- LDCT benign  Hiatal hernia and reflux symptoms- does not want surgery at this time  - Continue spiriva, told to do trials off of it to see if it really helps, if not fine to go off of it - Continued smoking abstinence counseled - f/u 1 year for LDCT ordering and see how she is doing   Current Outpatient Medications:  .  aspirin EC 81 MG tablet, Take 1 tablet (81 mg total) by mouth daily., Disp: 90 tablet, Rfl: 3 .  atorvastatin (LIPITOR) 80 MG tablet, Take 1 tablet (80 mg total) by mouth daily., Disp: 30 tablet, Rfl: 1 .  cholecalciferol (VITAMIN D) 1000 UNITS tablet, Take 1,000 Units by mouth daily., Disp: , Rfl:  .  clopidogrel (PLAVIX) 75 MG tablet, Take 1 tablet (75 mg total) by mouth daily., Disp: 30 tablet, Rfl: 1 .  diazepam (VALIUM) 2 MG tablet, TAKE ONE TABLET BY MOUTH TWICE DAILY, Disp: 60 tablet, Rfl: 5 .  famotidine (PEPCID) 40 MG tablet, TAKE ONE TABLET BY MOUTH DAILY WITH LUNCH, Disp: 30 tablet, Rfl: 1 .  fexofenadine (ALLEGRA) 180 MG tablet, Take 180 mg by mouth daily. Take 1 tablet daily for allergies, Disp: , Rfl:  .  lisinopril (ZESTRIL) 5 MG tablet, TAKE ONE TABLET BY MOUTH DAILY, Disp: 30 tablet, Rfl: 2 .  Omega-3 Fatty Acids (FISH OIL) 1000 MG CAPS, Take  2,000 mg by mouth daily. , Disp: , Rfl:  .  pantoprazole (PROTONIX) 40 MG tablet, Take 1 tablet (40 mg total) by mouth 2 (two) times daily., Disp: 60 tablet, Rfl: 1 .  tiotropium (SPIRIVA HANDIHALER) 18 MCG inhalation capsule, Place 1 capsule (18 mcg total) into inhaler and inhale daily., Disp: 30 capsule, Rfl: 2  Candee Furbish, MD [facility] 07/05/2019 3:47 PM '

## 2019-07-05 NOTE — Progress Notes (Signed)
PFT done today. 

## 2019-07-05 NOTE — Patient Instructions (Signed)
-   It was good to see you today - CT chest looked okay - Lung function tests show you do not have COPD - Try the spiriva and use only if helping you.

## 2019-07-11 ENCOUNTER — Other Ambulatory Visit: Payer: Self-pay

## 2019-07-11 DIAGNOSIS — I6523 Occlusion and stenosis of bilateral carotid arteries: Secondary | ICD-10-CM

## 2019-07-15 ENCOUNTER — Ambulatory Visit: Payer: PPO | Admitting: Family

## 2019-07-15 ENCOUNTER — Encounter: Payer: Self-pay | Admitting: Family

## 2019-07-15 ENCOUNTER — Ambulatory Visit (HOSPITAL_COMMUNITY)
Admission: RE | Admit: 2019-07-15 | Discharge: 2019-07-15 | Disposition: A | Discharge status: Home / Self Care | Payer: PPO | Source: Ambulatory Visit | Attending: Family | Admitting: Family

## 2019-07-15 ENCOUNTER — Other Ambulatory Visit: Payer: Self-pay

## 2019-07-15 VITALS — BP 131/84 | HR 84 | Temp 98.1°F | Resp 14 | Ht 63.5 in | Wt 144.0 lb

## 2019-07-15 DIAGNOSIS — Z87891 Personal history of nicotine dependence: Secondary | ICD-10-CM

## 2019-07-15 DIAGNOSIS — I6523 Occlusion and stenosis of bilateral carotid arteries: Secondary | ICD-10-CM

## 2019-07-15 DIAGNOSIS — Z9889 Other specified postprocedural states: Secondary | ICD-10-CM | POA: Diagnosis not present

## 2019-07-15 DIAGNOSIS — Z959 Presence of cardiac and vascular implant and graft, unspecified: Secondary | ICD-10-CM | POA: Diagnosis not present

## 2019-07-15 NOTE — Patient Instructions (Signed)

## 2019-07-15 NOTE — Progress Notes (Signed)
Chief Complaint: Follow up Extracranial Carotid Artery Stenosis   History of Present Illness  [name] is a 68 y.o. female who is s/p right carotid endarterectomy on 08/21/2007. She has had an innominate artery stent placed on 06/23/2006, and a left common carotid artery stent on 03/31/2005; all of these procedures were performed by Dr. Amedeo Plenty. Dr. Trula Slade has been monitoring pt since Dr. Amedeo Plenty departure.   Patient has a history of a TIA in 2006 but denies any symptoms, states she had no sx's other than dizziness, was told that she had TIA's by results of CT.   She denies any history ofamaurosis fugax or monocular blindness,hemiplegia, orreceptive or expressive aphasia.   She denies claudication type symptoms inherlegs with walking.  She walks 3 miles daily, is diligent about eating healthy.  She states that she had a cardiac stress test in May of 2020, states the results were good. Dr. Irish Lack is her cardiologist.   Diabetic: pre-DM Tobacco use: former smoker, quit in 2006  Pt meds include: Statin : Yes ASA: Yes Other anticoagulants/antiplatelets: Plavix, she denies any bleeding issues    Past Medical History:  Diagnosis Date  . Abdominal bruit 04/2017   Aortic u/s showed NO ANEURISM.  Marland Kitchen Allergy    SEASONAL  . Anxiety   . BCC (basal cell carcinoma), arm, right   . Carotid artery occlusion   . Cataract   . Colon cancer screening 05/2017   Colonoscopy reportedly normal approx 2008.  Cologuard NEG--repeat 3 yrs.  Marland Kitchen COPD (chronic obstructive pulmonary disease) ([place])    DOE, worse since 11/2018.  Stress test neg 01/2019.  Albut x 1 inh prn helpful some.  Pulm eval 05/29/19->Breo trial, PFTs ordered, CT lung ca screening ordered.  . Essential hypertension, benign 05/22/2014  . GERD (gastroesophageal reflux disease)   . H/O hiatal hernia 02/2019   EGD-->sugery recommended  . Melanoma ([place])   . Mitral valve prolapse   . Mixed hyperlipidemia 05/22/2014  .  Osteopenia 06/06/2016   05/2016 DEXA T-score -1.5.  06/2018 DEXA T score -1.1  . Peripheral vascular disease, unspecified ([place]) 05/22/2014   innominate and carotid.  U/S f/u 12/2016 showed no signif change compared to 2017--vascular recommended repeat 1 yr.  Doing well as of 08/2018 cardiol f/u-->continued on ASA and Plavix.  . Prediabetes 2018   2018 A1c 6.3%.  05/2018 A1c 6.1%.  . Reflux esophagitis   . Stroke [facility]) 2006   Mini  Stroke- not major  . Tobacco abuse, in remission    quit 2009    Social History Social History   Tobacco Use  . Smoking status: Former Smoker    Types: Cigarettes    Quit date: 09/27/2007    Years since quitting: 11.8  . Smokeless tobacco: Never Used  Substance Use Topics  . Alcohol use: No  . Drug use: No    Family History Family History  Problem Relation Age of Onset  . Heart attack Mother   . CAD Mother   . Heart disease Mother        Before age 85  . Hypertension Mother   . Hyperlipidemia Mother   . Hypertension Father   . Pneumonia Father   . Hyperlipidemia Father   . Heart murmur Sister   . Osteoporosis Paternal Grandmother   . CAD Paternal Grandfather   . Breast cancer Neg Hx   . Colon cancer Neg Hx   . Esophageal cancer Neg Hx   . Rectal cancer Neg  Hx   . Stomach cancer Neg Hx   . Colon polyps Neg Hx     Surgical History Past Surgical History:  Procedure Laterality Date  . ABDOMINAL HYSTERECTOMY  1997  . CARDIAC CATHETERIZATION  01/2012   NORMAL  . CARDIOVASCULAR STRESS TEST  02/05/2019   NORMAL.  EF normal.  . CAROTID ENDARTERECTOMY  08/21/2007   right - Dr. Amedeo Plenty  . CHOLECYSTECTOMY  2000  . COLONOSCOPY  approx 2008   (Dr. Lenon Ahmadi pt->normal  . DEXA  06/06/2016   05/2016 T-score -1.5.  06/2018 T score -1.1.  Repeat 2 yrs.  . ESOPHAGOGASTRODUODENOSCOPY  03/11/2019   5 cm hiatal hernia; GI suggested surgical repair due to intractable GER  . Incision and drainage of left thenar space abscess  02/23/15   s/p cat bite (Dr.  Amedeo Plenty)  . innominate stent  2007   12/2014 u/s showed patent stent in innominate and L common carotid.  L prox int carotid 40-59% stenosis  . Left common carotid stent  2006 and 2007   "                    "                  "                          "                             "  . TRANSTHORACIC ECHOCARDIOGRAM  2007   NORMAL    Allergies  Allergen Reactions  . Doxycycline Shortness Of Breath and Swelling    Lips swelling and short of breath  . Fentanyl Nausea And Vomiting    Pt states all pain medications cause N/V for her Pt states all pain medications cause N/V for her  . Fluocinolone Nausea And Vomiting  . Meperidine Nausea And Vomiting  . Penicillins Hives  . Breo Ellipta [Fluticasone Furoate-Vilanterol] Other (See Comments)    Lower extremity weakness, lightheadedness     Current Outpatient Medications  Medication Sig Dispense Refill  . aspirin EC 81 MG tablet Take 1 tablet (81 mg total) by mouth daily. 90 tablet 3  . atorvastatin (LIPITOR) 80 MG tablet Take 1 tablet (80 mg total) by mouth daily. 30 tablet 1  . cholecalciferol (VITAMIN D) 1000 UNITS tablet Take 1,000 Units by mouth daily.    . clopidogrel (PLAVIX) 75 MG tablet Take 1 tablet (75 mg total) by mouth daily. 30 tablet 1  . diazepam (VALIUM) 2 MG tablet TAKE ONE TABLET BY MOUTH TWICE DAILY 60 tablet 5  . famotidine (PEPCID) 40 MG tablet TAKE ONE TABLET BY MOUTH DAILY WITH LUNCH 30 tablet 1  . fexofenadine (ALLEGRA) 180 MG tablet Take 180 mg by mouth daily. Take 1 tablet daily for allergies    . lisinopril (ZESTRIL) 5 MG tablet TAKE ONE TABLET BY MOUTH DAILY 30 tablet 2  . Omega-3 Fatty Acids (FISH OIL) 1000 MG CAPS Take 2,000 mg by mouth daily.     . pantoprazole (PROTONIX) 40 MG tablet Take 1 tablet (40 mg total) by mouth 2 (two) times daily. 60 tablet 1  . tiotropium (SPIRIVA HANDIHALER) 18 MCG inhalation capsule Place 1 capsule (18 mcg total) into inhaler and inhale daily. 30 capsule 11   No current  facility-administered medications for this visit.  Review of Systems : See HPI for pertinent positives and negatives.  Physical Examination  Vitals:   07/15/19 1032 07/15/19 1035  BP: 137/79 131/84  Pulse: 82 84  Resp: 14   Temp: 98.1 F (36.7 C)   TempSrc: Temporal   SpO2: 100%   Weight: 144 lb (65.3 kg)   Height: 5' 3.5" (1.613 m)    Body mass index is 25.11 kg/m.  General: WDWN female in NAD GAIT: normal Eyes: Pupils are equal HENT: No gross abnormalities.  Pulmonary:  Respirations are non-labored, good air movement in all fields, CTAB, no rales, rhonchi, or  wheezes Cardiac: regular rhythm, no detected murmur.  VASCULAR EXAM Carotid Bruits Right Left   negative Positive     Abdominal aortic pulse is not palpable. Radial pulses are 2+ palpable and equal.                                                                                                                            LE Pulses Right Left       POPLITEAL  not palpable   not palpable       POSTERIOR TIBIAL  not palpable   2+ palpable        DORSALIS PEDIS      ANTERIOR TIBIAL not palpable  not palpable     Gastrointestinal: soft, nontender, BS WNL, no r/g, no palpable masses. Musculoskeletal: no muscle atrophy/wasting. M/S 5/5 throughout, extremities without ischemic changes Skin: No rashes, no ulcers, no cellulitis.   Neurologic:  A&O X 3; appropriate affect, sensation is normal; speech is normal, CN 2-12 intact, pain and light touch intact in extremities, motor exam as listed above. Psychiatric: Normal thought content, mood appropriate to clinical situation.     DATA  Carotid Duplex (07-15-19): Right Carotid: Non-hemodynamically significant plaque <50% noted in the CCA.                Patent right carotid endarterectomy without evidence of                restenosis.                Distal innominate artery stent: 285 cm/s                Innominmate artery stent outfflow artery: 241 cm/s  with                turbulence. Left Carotid: Velocities in the left ICA are consistent with a 40-59% stenosis.               Non-hemodynamically significant plaque <50% noted in the CCA. The               ECA appears <50% stenosed. Unable to identift proximal CCA stent. Vertebrals:  Bilateral vertebral arteries demonstrate antegrade flow. 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 Right subclavian artery flow was disturbed.    Carotid Duplex (01/22/18): Right ICA: CEA site, with no restenosis Left  ICA: 40-59% stenosis Could not positively identify the left CCA stent Innominate PSV outflow at 314 cm/s (compared to 240 cm/s on 01-09-17) Bilateral vertebral artery flow is antegrade.  Bilateral subclavian artery waveforms are normal.  Slight increased stenosis in innominate outflow compared to the exam on 01-09-17.     CT angiogram of neck and chest on 08/06/12 requested by Dr. Trula Slade: 1. Patent short segment stents at the brachial cephalic artery and  left common carotid artery origins.  2. Interval right carotid endarterectomy. Suggestion of ulcerated  plaque in the right CCA at the level of the vocal cords. No  cervical right carotid stenosis.  3. Mild soft plaque in the left CCA. Bulky plaque at the left ICA  origin and proximal bulb with short segment stenosis of up to 60-  65%.  4. Right subclavian artery origin stenosis appears related to soft  plaque and measures up to 65%.  5. Normal vertebral arteries.  1. Wide patency of both the right innominate and left common  carotid artery stents without hemodynamically significant stenosis.  2. Normal caliber of the thoracic aorta without evidence of  dissection.  3. Extensive centrilobular emphysema.    Assessment: DAJAHNAE HOLLIGAN is a 68 y.o. female who is s/p right carotid endarterectomy on 08/21/07; Innominate artery stent on 06/23/06; Left common carotid artery stent on 03/31/05. Patient has a  history of a TIA in 2006 but denies any symptoms.She has had no subsequent TIA or stroke.  Today's carotid duplex shows stable ICA stenosis compared to April 2019 exam.   She walks 3 miles daily, is diligent about eating healthy. Her atherosclerotic risk factors include former smoker, pre-DM, and dyslipidemia.  She takes a daily statin, ASA 81 ma, and Plavix.    Plan: Follow-up in 1 year with Carotid Duplex scan.   I discussed in depth with the patient the nature of atherosclerosis, and emphasized the importance of maximal medical management including strict control of blood pressure, blood glucose, and lipid levels, obtaining regular exercise, and continued cessation of smoking.  The patient is aware that without maximal medical management the underlying atherosclerotic disease process will progress, limiting the benefit of any interventions. The patient was given information about stroke prevention and what symptoms should prompt the patient to seek immediate medical care. Thank you for allowing [place] to participate in this patient's care.  Clemon Chambers, RN, MSN, FNP-C [facility] Office: [phone]  Clinic Physician: Trula Slade on call  07/15/19 10:43 AM

## 2019-07-17 ENCOUNTER — Other Ambulatory Visit: Payer: Self-pay | Admitting: Family Medicine

## 2019-07-17 DIAGNOSIS — Z1231 Encounter for screening mammogram for malignant neoplasm of breast: Secondary | ICD-10-CM

## 2019-07-21 ENCOUNTER — Encounter: Payer: Self-pay | Admitting: Family Medicine

## 2019-07-22 ENCOUNTER — Other Ambulatory Visit: Payer: Self-pay | Admitting: Family Medicine

## 2019-07-28 DIAGNOSIS — D509 Iron deficiency anemia, unspecified: Secondary | ICD-10-CM

## 2019-07-28 DIAGNOSIS — Z862 Personal history of diseases of the blood and blood-forming organs and certain disorders involving the immune mechanism: Secondary | ICD-10-CM

## 2019-07-28 HISTORY — DX: Iron deficiency anemia, unspecified: D50.9

## 2019-07-28 HISTORY — DX: Personal history of diseases of the blood and blood-forming organs and certain disorders involving the immune mechanism: Z86.2

## 2019-08-01 ENCOUNTER — Other Ambulatory Visit: Payer: Self-pay

## 2019-08-01 ENCOUNTER — Ambulatory Visit (INDEPENDENT_AMBULATORY_CARE_PROVIDER_SITE_OTHER): Payer: PPO | Admitting: Family Medicine

## 2019-08-01 ENCOUNTER — Encounter: Payer: Self-pay | Admitting: Family Medicine

## 2019-08-01 VITALS — BP 108/71 | HR 82 | Temp 97.5°F | Resp 16 | Ht 63.5 in | Wt 149.0 lb

## 2019-08-01 DIAGNOSIS — I10 Essential (primary) hypertension: Secondary | ICD-10-CM | POA: Diagnosis not present

## 2019-08-01 DIAGNOSIS — Z1231 Encounter for screening mammogram for malignant neoplasm of breast: Secondary | ICD-10-CM | POA: Diagnosis not present

## 2019-08-01 DIAGNOSIS — E663 Overweight: Secondary | ICD-10-CM | POA: Diagnosis not present

## 2019-08-01 DIAGNOSIS — Z1211 Encounter for screening for malignant neoplasm of colon: Secondary | ICD-10-CM | POA: Diagnosis not present

## 2019-08-01 DIAGNOSIS — F411 Generalized anxiety disorder: Secondary | ICD-10-CM | POA: Diagnosis not present

## 2019-08-01 DIAGNOSIS — E7800 Pure hypercholesterolemia, unspecified: Secondary | ICD-10-CM | POA: Diagnosis not present

## 2019-08-01 DIAGNOSIS — I739 Peripheral vascular disease, unspecified: Secondary | ICD-10-CM

## 2019-08-01 DIAGNOSIS — Z0000 Encounter for general adult medical examination without abnormal findings: Secondary | ICD-10-CM | POA: Diagnosis not present

## 2019-08-01 DIAGNOSIS — R7301 Impaired fasting glucose: Secondary | ICD-10-CM

## 2019-08-01 LAB — CBC WITH DIFFERENTIAL/PLATELET
Basophils Absolute: 0.1 10*3/uL (ref 0.0–0.1)
Basophils Relative: 0.7 % (ref 0.0–3.0)
Eosinophils Absolute: 0.2 10*3/uL (ref 0.0–0.7)
Eosinophils Relative: 3 % (ref 0.0–5.0)
HCT: 36.5 % (ref 36.0–46.0)
Hemoglobin: 11.6 g/dL — ABNORMAL LOW (ref 12.0–15.0)
Lymphocytes Relative: 37.6 % (ref 12.0–46.0)
Lymphs Abs: 2.7 10*3/uL (ref 0.7–4.0)
MCHC: 31.8 g/dL (ref 30.0–36.0)
MCV: 76.3 fl — ABNORMAL LOW (ref 78.0–100.0)
Monocytes Absolute: 0.6 10*3/uL (ref 0.1–1.0)
Monocytes Relative: 8.2 % (ref 3.0–12.0)
Neutro Abs: 3.6 10*3/uL (ref 1.4–7.7)
Neutrophils Relative %: 50.5 % (ref 43.0–77.0)
Platelets: 360 10*3/uL (ref 150.0–400.0)
RBC: 4.79 Mil/uL (ref 3.87–5.11)
RDW: 17.7 % — ABNORMAL HIGH (ref 11.5–15.5)
WBC: 7.1 10*3/uL (ref 4.0–10.5)

## 2019-08-01 LAB — HEMOGLOBIN A1C: Hgb A1c MFr Bld: 6.4 % (ref 4.6–6.5)

## 2019-08-01 LAB — LIPID PANEL
Cholesterol: 153 mg/dL (ref 0–200)
HDL: 44.6 mg/dL (ref >39.00)
LDL Cholesterol: 74 mg/dL (ref 0–99)
NonHDL: 108.76
Total CHOL/HDL Ratio: 3
Triglycerides: 175 mg/dL — ABNORMAL HIGH (ref 0.0–149.0)
VLDL: 35 mg/dL (ref 0.0–40.0)

## 2019-08-01 LAB — COMPREHENSIVE METABOLIC PANEL
ALT: 22 U/L (ref 0–35)
AST: 20 U/L (ref 0–37)
Albumin: 4.3 g/dL (ref 3.5–5.2)
Alkaline Phosphatase: 113 U/L (ref 39–117)
BUN: 15 mg/dL (ref 6–23)
CO2: 26 mEq/L (ref 19–32)
Calcium: 9.9 mg/dL (ref 8.4–10.5)
Chloride: 105 mEq/L (ref 96–112)
Creatinine, Ser: 0.74 mg/dL (ref 0.40–1.20)
GFR: 77.99 mL/min (ref >60.00)
Glucose, Bld: 94 mg/dL (ref 70–99)
Potassium: 4.9 mEq/L (ref 3.5–5.1)
Sodium: 139 mEq/L (ref 135–145)
Total Bilirubin: 1.3 mg/dL — ABNORMAL HIGH (ref 0.2–1.2)
Total Protein: 7 g/dL (ref 6.0–8.3)

## 2019-08-01 LAB — TSH: TSH: 1.1 u[IU]/mL (ref 0.35–4.50)

## 2019-08-01 NOTE — Patient Instructions (Signed)
Health Maintenance, Female Adopting a healthy lifestyle and getting preventive care are important in promoting health and wellness. Ask your health care provider about:  The right schedule for you to have regular tests and exams.  Things you can do on your own to prevent diseases and keep yourself healthy. What should I know about diet, weight, and exercise? Eat a healthy diet   Eat a diet that includes plenty of vegetables, fruits, low-fat dairy products, and lean protein.  Do not eat a lot of foods that are high in solid fats, added sugars, or sodium. Maintain a healthy weight Body mass index (BMI) is used to identify weight problems. It estimates body fat based on height and weight. Your health care provider can help determine your BMI and help you achieve or maintain a healthy weight. Get regular exercise Get regular exercise. This is one of the most important things you can do for your health. Most adults should:  Exercise for at least 150 minutes each week. The exercise should increase your heart rate and make you sweat (moderate-intensity exercise).  Do strengthening exercises at least twice a week. This is in addition to the moderate-intensity exercise.  Spend less time sitting. Even light physical activity can be beneficial. Watch cholesterol and blood lipids Have your blood tested for lipids and cholesterol at 68 years of age, then have this test every 5 years. Have your cholesterol levels checked more often if:  Your lipid or cholesterol levels are high.  You are older than 68 years of age.  You are at high risk for heart disease. What should I know about cancer screening? Depending on your health history and family history, you may need to have cancer screening at various ages. This may include screening for:  Breast cancer.  Cervical cancer.  Colorectal cancer.  Skin cancer.  Lung cancer. What should I know about heart disease, diabetes, and high blood  pressure? Blood pressure and heart disease  High blood pressure causes heart disease and increases the risk of stroke. This is more likely to develop in people who have high blood pressure readings, are of African descent, or are overweight.  Have your blood pressure checked: ? Every 3-5 years if you are 18-39 years of age. ? Every year if you are 40 years old or older. Diabetes Have regular diabetes screenings. This checks your fasting blood sugar level. Have the screening done:  Once every three years after age 40 if you are at a normal weight and have a low risk for diabetes.  More often and at a younger age if you are overweight or have a high risk for diabetes. What should I know about preventing infection? Hepatitis B If you have a higher risk for hepatitis B, you should be screened for this virus. Talk with your health care provider to find out if you are at risk for hepatitis B infection. Hepatitis C Testing is recommended for:  Everyone born from 1945 through 1965.  Anyone with known risk factors for hepatitis C. Sexually transmitted infections (STIs)  Get screened for STIs, including gonorrhea and chlamydia, if: ? You are sexually active and are younger than 68 years of age. ? You are older than 68 years of age and your health care provider tells you that you are at risk for this type of infection. ? Your sexual activity has changed since you were last screened, and you are at increased risk for chlamydia or gonorrhea. Ask your health care provider if   you are at risk.  Ask your health care provider about whether you are at high risk for HIV. Your health care provider may recommend a prescription medicine to help prevent HIV infection. If you choose to take medicine to prevent HIV, you should first get tested for HIV. You should then be tested every 3 months for as long as you are taking the medicine. Pregnancy  If you are about to stop having your period (premenopausal) and  you may become pregnant, seek counseling before you get pregnant.  Take 400 to 800 micrograms (mcg) of folic acid every day if you become pregnant.  Ask for birth control (contraception) if you want to prevent pregnancy. Osteoporosis and menopause Osteoporosis is a disease in which the bones lose minerals and strength with aging. This can result in bone fractures. If you are 65 years old or older, or if you are at risk for osteoporosis and fractures, ask your health care provider if you should:  Be screened for bone loss.  Take a calcium or vitamin D supplement to lower your risk of fractures.  Be given hormone replacement therapy (HRT) to treat symptoms of menopause. Follow these instructions at home: Lifestyle  Do not use any products that contain nicotine or tobacco, such as cigarettes, e-cigarettes, and chewing tobacco. If you need help quitting, ask your health care provider.  Do not use street drugs.  Do not share needles.  Ask your health care provider for help if you need support or information about quitting drugs. Alcohol use  Do not drink alcohol if: ? Your health care provider tells you not to drink. ? You are pregnant, may be pregnant, or are planning to become pregnant.  If you drink alcohol: ? Limit how much you use to 0-1 drink a day. ? Limit intake if you are breastfeeding.  Be aware of how much alcohol is in your drink. In the [place] one drink equals one 12 oz bottle of beer (355 mL), one 5 oz glass of wine (148 mL), or one 1 oz glass of hard liquor (44 mL). General instructions  Schedule regular health, dental, and eye exams.  Stay current with your vaccines.  Tell your health care provider if: ? You often feel depressed. ? You have ever been abused or do not feel safe at home. Summary  Adopting a healthy lifestyle and getting preventive care are important in promoting health and wellness.  Follow your health care provider's instructions about healthy  diet, exercising, and getting tested or screened for diseases.  Follow your health care provider's instructions on monitoring your cholesterol and blood pressure. This information is not intended to replace advice given to you by your health care provider. Make sure you discuss any questions you have with your health care provider. Document Released: 03/28/2011 Document Revised: 09/05/2018 Document Reviewed: 09/05/2018 Elsevier Patient Education  2020 Elsevier Inc.  

## 2019-08-01 NOTE — Progress Notes (Signed)
Office Note 08/01/2019  CC:  Chief Complaint  Patient presents with  . Annual Exam    pt is fasting    HPI:  [name] is a 68 y.o. White female with HTN, HLD, IFG, PAD, and GAD who is here for annual health maintenance exam.  GAD: valium 2mg  bid prn. Takes 2 tabs hs typically and it helps her sleep. No adverse effects from this med. [place] 11/2018, UDS same day.  Walks 7 d/week, has an incline to go up. Diet;good but some "boredom eating" that she is overcoming.    Past Medical History:  Diagnosis Date  . Abdominal bruit 04/2017   Aortic u/s showed NO ANEURISM.  Marland Kitchen Allergy    SEASONAL  . Anxiety   . BCC (basal cell carcinoma), arm, right   . Carotid artery occlusion   . Cataract   . Colon cancer screening 05/2017   Colonoscopy reportedly normal approx 2008.  Cologuard NEG--repeat 3 yrs.  Marland Kitchen COPD (chronic obstructive pulmonary disease) ([place])    DOE, worse since 11/2018.  Stress test neg 01/2019.  Albut x 1 inh prn helpful some.  Pulm 06/2019, PFTs not diag of COPD, DOE likely from deconditioning.  . Essential hypertension, benign 05/22/2014  . GERD (gastroesophageal reflux disease)   . H/O hiatal hernia 02/2019   EGD-->sugery recommended  . Melanoma ([place])   . Mitral valve prolapse   . Mixed hyperlipidemia 05/22/2014  . Osteopenia 06/06/2016   05/2016 DEXA T-score -1.5.  06/2018 DEXA T score -1.1  . Peripheral vascular disease, unspecified ([place]) 05/22/2014   innominate and carotid.  U/S f/u 12/2016 showed no signif change compared to 2017--vascular recommended repeat 1 yr.  Doing well as of 08/2018 cardiol f/u-->continued on ASA and Plavix.  . Prediabetes 2018   2018 A1c 6.3%.  05/2018 A1c 6.1%.  . Reflux esophagitis   . Stroke [facility] 2006   Mini  Stroke- not major  . Tobacco abuse, in remission    quit 2009    Past Surgical History:  Procedure Laterality Date  . ABDOMINAL HYSTERECTOMY  1997  . CARDIAC CATHETERIZATION  01/2012   NORMAL  . CARDIOVASCULAR STRESS  TEST  02/05/2019   NORMAL.  EF normal.  . CAROTID ENDARTERECTOMY  08/21/2007   right - Dr. Amedeo Plenty  . CHOLECYSTECTOMY  2000  . COLONOSCOPY  approx 2008   (Dr. Lenon Ahmadi pt->normal  . DEXA  06/06/2016   05/2016 T-score -1.5.  06/2018 T score -1.1.  Repeat 2 yrs.  . ESOPHAGOGASTRODUODENOSCOPY  03/11/2019   5 cm hiatal hernia; GI suggested surgical repair due to intractable GER  . Incision and drainage of left thenar space abscess  02/23/15   s/p cat bite (Dr. Amedeo Plenty)  . innominate stent  2007   12/2014 u/s showed patent stent in innominate and L common carotid.  L prox int carotid 40-59% stenosis  . LDCT for lung ca screening  06/2019   NEG->rpt 1 yr  . Left common carotid stent  2006 and 2007   "                    "                  "                          "                             "  .  TRANSTHORACIC ECHOCARDIOGRAM  January 03, 2006   NORMAL  . US CAROTID DOPPLER BILATERAL ([facility])  06/2019   left stent w/out stenosis.  R ICA sten 40-50% obst-stable. Rpt 1 yr.    Family History  Problem Relation Age of Onset  . Heart attack Mother   . CAD Mother   . Heart disease Mother        Before age 42  . Hypertension Mother   . Hyperlipidemia Mother   . Hypertension Father   . Pneumonia Father   . Hyperlipidemia Father   . Heart murmur Sister   . Osteoporosis Paternal Grandmother   . CAD Paternal Grandfather   . Breast cancer Neg Hx   . Colon cancer Neg Hx   . Esophageal cancer Neg Hx   . Rectal cancer Neg Hx   . Stomach cancer Neg Hx   . Colon polyps Neg Hx     Social History   Socioeconomic History  . Marital status: Married    Spouse name: Not on file  . Number of children: Not on file  . Years of education: Not on file  . Highest education level: Not on file  Occupational History  . Not on file  Social Needs  . Financial resource strain: Not on file  . Food insecurity    Worry: Not on file    Inability: Not on file  . Transportation needs    Medical: Not on file     Non-medical: Not on file  Tobacco Use  . Smoking status: Former Smoker    Types: Cigarettes    Quit date: 09/27/2007    Years since quitting: 11.8  . Smokeless tobacco: Never Used  Substance and Sexual Activity  . Alcohol use: No  . Drug use: No  . Sexual activity: Not on file  Lifestyle  . Physical activity    Days per week: Not on file    Minutes per session: Not on file  . Stress: Not on file  Relationships  . Social Herbalist on phone: Not on file    Gets together: Not on file    Attends religious service: Not on file    Active member of club or organization: Not on file    Attends meetings of clubs or organizations: Not on file    Relationship status: Not on file  . Intimate partner violence    Fear of current or ex partner: Not on file    Emotionally abused: Not on file    Physically abused: Not on file    Forced sexual activity: Not on file  Other Topics Concern  . Not on file  Social History Narrative   Widowed as of January 04, 2015 (husband died of viral illness while out of town in [place], [place]), has one daughter.   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   Tobacco: 20 pack-yr hx, quit 01-04-2008.   Alcohol: rare.   No hx of alc or drug problems.   Exercise: walks 2.62miles on treadmill daily.    Outpatient Medications Prior to Visit  Medication Sig Dispense Refill  . aspirin EC 81 MG tablet Take 1 tablet (81 mg total) by mouth daily. 90 tablet 3  . atorvastatin (LIPITOR) 80 MG tablet Take 1 tablet (80 mg total) by mouth daily. 30 tablet 1  . cholecalciferol (VITAMIN D) 1000 UNITS tablet Take 1,000 Units by mouth daily.    . clopidogrel (PLAVIX) 75 MG  tablet Take 1 tablet (75 mg total) by mouth daily. 30 tablet 1  . diazepam (VALIUM) 2 MG tablet TAKE ONE TABLET BY MOUTH TWICE DAILY 60 tablet 5  . famotidine (PEPCID) 40 MG tablet TAKE ONE TABLET BY MOUTH DAILY WITH LUNCH 30 tablet 1  . fexofenadine (ALLEGRA) 180 MG tablet  Take 180 mg by mouth daily. Take 1 tablet daily for allergies    . lisinopril (ZESTRIL) 5 MG tablet TAKE ONE TABLET BY MOUTH DAILY 30 tablet 2  . Omega-3 Fatty Acids (FISH OIL) 1000 MG CAPS Take 2,000 mg by mouth daily.     . pantoprazole (PROTONIX) 40 MG tablet Take 1 tablet (40 mg total) by mouth 2 (two) times daily. 60 tablet 1  . tiotropium (SPIRIVA HANDIHALER) 18 MCG inhalation capsule Place 1 capsule (18 mcg total) into inhaler and inhale daily. 30 capsule 11   No facility-administered medications prior to visit.     Allergies  Allergen Reactions  . Doxycycline Shortness Of Breath and Swelling    Lips swelling and short of breath  . Fentanyl Nausea And Vomiting    Pt states all pain medications cause N/V for her Pt states all pain medications cause N/V for her  . Fluocinolone Nausea And Vomiting  . Meperidine Nausea And Vomiting  . Penicillins Hives  . Breo Ellipta [Fluticasone Furoate-Vilanterol] Other (See Comments)    Lower extremity weakness, lightheadedness     ROS Review of Systems  Constitutional: Negative for appetite change, chills, fatigue and fever.  HENT: Negative for congestion, dental problem, ear pain and sore throat.   Eyes: Negative for discharge, redness and visual disturbance.  Respiratory: Negative for cough, chest tightness, shortness of breath and wheezing.   Cardiovascular: Negative for chest pain, palpitations and leg swelling.  Gastrointestinal: Negative for abdominal pain, blood in stool, diarrhea, nausea and vomiting.  Genitourinary: Negative for difficulty urinating, dysuria, flank pain, frequency, hematuria and urgency.  Musculoskeletal: Negative for arthralgias, back pain, joint swelling, myalgias and neck stiffness.  Skin: Negative for pallor and rash.  Neurological: Negative for dizziness, speech difficulty, weakness and headaches.  Hematological: Negative for adenopathy. Does not bruise/bleed easily.  Psychiatric/Behavioral: Negative for  confusion and sleep disturbance. The patient is not nervous/anxious.     PE; Blood pressure 108/71, pulse 82, temperature (!) 97.5 F (36.4 C), temperature source Temporal, resp. rate 16, height 5' 3.5" (1.613 m), weight 149 lb (67.6 kg), SpO2 99 %. Body mass index is 25.98 kg/m. Exam chaperoned by CMA Marlene Bast Gen: Alert, well appearing.  Patient is oriented to person, place, time, and situation. AFFECT: pleasant, lucid thought and speech. ENT: Ears: EACs clear, normal epithelium.  TMs with good light reflex and landmarks bilaterally.  Eyes: no injection, icteris, swelling, or exudate.  EOMI, PERRLA. Nose: no drainage or turbinate edema/swelling.  No injection or focal lesion.  Mouth: lips without lesion/swelling.  Oral mucosa pink and moist.  Dentition intact and without obvious caries or gingival swelling.  Oropharynx without erythema, exudate, or swelling.  Neck: supple/nontender.  No LAD, mass, or TM.  Carotid pulses 2+ bilaterally, without bruits. CV: RRR, no m/r/g.   LUNGS: CTA bilat, nonlabored resps, good aeration in all lung fields. ABD: soft, NT, ND, BS normal.  No hepatospenomegaly or mass.  Some bruit in supraumbilical region, nothing palpable (hx of US aorta 2018 NEG for aneurism). EXT: no clubbing, cyanosis, or edema.  Musculoskeletal: no joint swelling, erythema, warmth, or tenderness.  ROM of all joints intact. Skin -  no sores or suspicious lesions or rashes or color changes   Pertinent labs:  Lab Results  Component Value Date   TSH 0.92 05/31/2018   Lab Results  Component Value Date   WBC 6.3 01/30/2019   HGB 12.9 01/30/2019   HCT 39.0 01/30/2019   MCV 86.5 01/30/2019   PLT 364.0 01/30/2019   Lab Results  Component Value Date   CREATININE 0.72 01/30/2019   BUN 15 01/30/2019   NA 140 01/30/2019   K 4.6 01/30/2019   CL 103 01/30/2019   CO2 28 01/30/2019   Lab Results  Component Value Date   ALT 24 01/30/2019   AST 21 01/30/2019   ALKPHOS 109  01/30/2019   BILITOT 1.3 (H) 01/30/2019   Lab Results  Component Value Date   CHOL 159 11/28/2018   Lab Results  Component Value Date   HDL 52.10 11/28/2018   Lab Results  Component Value Date   LDLCALC 79 11/28/2018   Lab Results  Component Value Date   TRIG 137.0 11/28/2018   Lab Results  Component Value Date   CHOLHDL 3 11/28/2018   Lab Results  Component Value Date   HGBA1C 6.2 11/28/2018    ASSESSMENT AND PLAN:   Health maintenance exam: Reviewed age and gender appropriate health maintenance issues (prudent diet, regular exercise, health risks of tobacco and excessive alcohol, use of seatbelts, fire alarms in home, use of sunscreen).  Also reviewed age and gender appropriate health screening as well as vaccine recommendations. Vaccines:  ALL vaccines UTD. Labs: fasting HP+ A1c. Cervical ca screening: pt is s/p hysterectomy for benign dx.  pt declines any further screening paps. Breast ca screening: mammogram scheduled for 08/2019. DEXA: osteopenia; next DEXA to be 07/2020. Colon ca screening: needs repeat cologuard 05/2020.  GAD, with anxiety-related insomnia: doing well.  Has been stable long term on diazepam at low dosing. CSC and UDS UTD.  No new rx needed today.  An After Visit Summary was printed and given to the patient.  FOLLOW UP:  Return in about 6 months (around 01/29/2020) for routine chronic illness f/u.  Signed:  Crissie Sickles, MD           08/01/2019

## 2019-08-02 ENCOUNTER — Other Ambulatory Visit (INDEPENDENT_AMBULATORY_CARE_PROVIDER_SITE_OTHER): Payer: PPO

## 2019-08-02 DIAGNOSIS — D509 Iron deficiency anemia, unspecified: Secondary | ICD-10-CM | POA: Diagnosis not present

## 2019-08-02 LAB — IBC PANEL
Iron: 20 ug/dL — ABNORMAL LOW (ref 42–145)
Saturation Ratios: 3.5 % — ABNORMAL LOW (ref 20.0–50.0)
Transferrin: 403 mg/dL — ABNORMAL HIGH (ref 212.0–360.0)

## 2019-08-02 LAB — FERRITIN: Ferritin: 5.4 ng/mL — ABNORMAL LOW (ref 10.0–291.0)

## 2019-08-04 ENCOUNTER — Encounter: Payer: Self-pay | Admitting: Family Medicine

## 2019-08-05 ENCOUNTER — Encounter: Payer: Self-pay | Admitting: Family Medicine

## 2019-08-05 ENCOUNTER — Ambulatory Visit (INDEPENDENT_AMBULATORY_CARE_PROVIDER_SITE_OTHER): Payer: PPO | Admitting: Family Medicine

## 2019-08-05 ENCOUNTER — Other Ambulatory Visit: Payer: Self-pay

## 2019-08-05 VITALS — BP 123/81 | HR 89 | Resp 16 | Ht 63.5 in | Wt 149.0 lb

## 2019-08-05 DIAGNOSIS — D509 Iron deficiency anemia, unspecified: Secondary | ICD-10-CM

## 2019-08-05 DIAGNOSIS — K909 Intestinal malabsorption, unspecified: Secondary | ICD-10-CM

## 2019-08-05 NOTE — Patient Instructions (Signed)
You have iron deficiency that has caused your red blood cell count to be slightly low (anemia). The iron deficiency is believed to be from chronic, slow loss of blood from bleeding in the gastrointestinal tract UNTIL PROVEN OTHERWISE.  Therefore, we'll check your stool for blood that is not visible to the naked eye (hemoccult cards). Also, we'll have you get back to see your gastroenterologist for further evaluation (colonoscopy, possible upper endoscopy). For now, continue to take your aspirin and plavix (and all other current medications). Start over the counter iron: Ferrous Sulfate 325mg , 1 tab twice per day with orange juice. Try daily otc magnesium oxide 500mg  once a day to protect from constipation from the iron.

## 2019-08-05 NOTE — Progress Notes (Signed)
OFFICE VISIT  08/05/2019   CC:  Chief Complaint  Patient presents with  . Discuss abnormal labs   HPI:    Patient is a 68 y.o. Caucasian female who presents for discussion of recent abnormal lab work. Labs last week at the time of a f/u appt showed A1c 6.4%. Also, labs showed she has iron deficiency anemia  EGD 02/2019 showed one cameron erosion. Colonoscopy approx 2008 normal per pt report, iFOB neg 2018. Hb and MCV gradually dropping over the last year. No prior iron testing is available.  This could be from very slow bleeding from small erosions of esophagus and/or stomach from her severe GERD and hiatal hernia but she need a colonoscopy to r/o colon cancer as the cause. ASA and plavix are possibly contributing.   Prediab: she chooses to improve on diet and exercise, declines med for now. .     Past Medical History:  Diagnosis Date  . Abdominal bruit 04/2017   Aortic u/s showed NO ANEURISM.  Marland Kitchen Allergy    SEASONAL  . Anxiety   . BCC (basal cell carcinoma), arm, right   . Carotid artery occlusion   . Cataract   . Colon cancer screening 05/2017   Colonoscopy reportedly normal approx 2008.  Cologuard NEG--repeat 3 yrs.  Marland Kitchen COPD (chronic obstructive pulmonary disease) ([place])    DOE, worse since 11/2018.  Stress test neg 01/2019.  Albut x 1 inh prn helpful some.  Pulm 06/2019, PFTs not diag of COPD, DOE likely from deconditioning.  . Essential hypertension, benign 05/22/2014  . GERD (gastroesophageal reflux disease)   . H/O hiatal hernia 02/2019   EGD-->moderate size->surgery recommended but pt declined.  . Melanoma ([place])   . Mitral valve prolapse   . Mixed hyperlipidemia 05/22/2014  . Osteopenia 06/06/2016   05/2016 DEXA T-score -1.5.  06/2018 DEXA T score -1.1  . Peripheral vascular disease, unspecified ([place]) 05/22/2014   innominate and carotid.  U/S f/u 12/2016 showed no signif change compared to 2017--vascular recommended repeat 1 yr.  Doing well as of 08/2018 cardiol  f/u-->continued on ASA and Plavix.  . Prediabetes 2018   2018 A1c 6.3%.  05/2018 A1c 6.1%. 07/2019 A1c 6.4%.  Marland Kitchen Reflux esophagitis   . Stroke [facility]) 2006   Mini  Stroke- not major  . Tobacco abuse, in remission    quit 2009    Past Surgical History:  Procedure Laterality Date  . ABDOMINAL HYSTERECTOMY  1997  . CARDIAC CATHETERIZATION  01/2012   NORMAL  . CARDIOVASCULAR STRESS TEST  02/05/2019   NORMAL.  EF normal.  . CAROTID ENDARTERECTOMY  08/21/2007   right - Dr. Amedeo Plenty  . CHOLECYSTECTOMY  2000  . COLONOSCOPY  approx 2008   (Dr. Lenon Ahmadi pt->normal  . DEXA  06/06/2016   05/2016 T-score -1.5.  06/2018 T score -1.1.  Repeat 2 yrs.  . ESOPHAGOGASTRODUODENOSCOPY  03/11/2019   5 cm hiatal hernia; GI suggested surgical repair due to intractable GER  . Incision and drainage of left thenar space abscess  02/23/15   s/p cat bite (Dr. Amedeo Plenty)  . innominate stent  2007   12/2014 u/s showed patent stent in innominate and L common carotid.  L prox int carotid 40-59% stenosis  . LDCT for lung ca screening  06/2019   NEG->rpt 1 yr  . Left common carotid stent  2006 and 2007   "                    "                  "                          "                             "  .  TRANSTHORACIC ECHOCARDIOGRAM  2007   NORMAL  . US CAROTID DOPPLER BILATERAL ([place] [facility])  06/2019   left stent w/out stenosis.  R ICA sten 40-50% obst-stable. Rpt 1 yr.    Outpatient Medications Prior to Visit  Medication Sig Dispense Refill  . aspirin EC 81 MG tablet Take 1 tablet (81 mg total) by mouth daily. 90 tablet 3  . atorvastatin (LIPITOR) 80 MG tablet Take 1 tablet (80 mg total) by mouth daily. 30 tablet 1  . cholecalciferol (VITAMIN D) 1000 UNITS tablet Take 1,000 Units by mouth daily.    . clopidogrel (PLAVIX) 75 MG tablet Take 1 tablet (75 mg total) by mouth daily. 30 tablet 1  . diazepam (VALIUM) 2 MG tablet TAKE ONE TABLET BY MOUTH TWICE DAILY 60 tablet 5  . famotidine (PEPCID) 40 MG tablet TAKE ONE  TABLET BY MOUTH DAILY WITH LUNCH 30 tablet 1  . fexofenadine (ALLEGRA) 180 MG tablet Take 180 mg by mouth daily. Take 1 tablet daily for allergies    . lisinopril (ZESTRIL) 5 MG tablet TAKE ONE TABLET BY MOUTH DAILY 30 tablet 2  . Omega-3 Fatty Acids (FISH OIL) 1000 MG CAPS Take 2,000 mg by mouth daily.     . pantoprazole (PROTONIX) 40 MG tablet Take 1 tablet (40 mg total) by mouth 2 (two) times daily. 60 tablet 1  . tiotropium (SPIRIVA HANDIHALER) 18 MCG inhalation capsule Place 1 capsule (18 mcg total) into inhaler and inhale daily. 30 capsule 11   No facility-administered medications prior to visit.     Allergies  Allergen Reactions  . Doxycycline Shortness Of Breath and Swelling    Lips swelling and short of breath  . Fentanyl Nausea And Vomiting    Pt states all pain medications cause N/V for her Pt states all pain medications cause N/V for her  . Fluocinolone Nausea And Vomiting  . Meperidine Nausea And Vomiting  . Penicillins Hives  . Breo Ellipta [Fluticasone Furoate-Vilanterol] Other (See Comments)    Lower extremity weakness, lightheadedness     ROS As per HPI  PE: Blood pressure 123/81, pulse 89, resp. rate 16, height 5' 3.5" (1.613 m), weight 149 lb (67.6 kg), SpO2 99 %. Gen: Alert, well appearing.  Patient is oriented to person, place, time, and situation. AFFECT: pleasant, lucid thought and speech. No further exam at this time.  LABS:    Chemistry      Component Value Date/Time   NA 139 08/01/2019 1019   K 4.9 08/01/2019 1019   CL 105 08/01/2019 1019   CO2 26 08/01/2019 1019   BUN 15 08/01/2019 1019   CREATININE 0.74 08/01/2019 1019      Component Value Date/Time   CALCIUM 9.9 08/01/2019 1019   ALKPHOS 113 08/01/2019 1019   AST 20 08/01/2019 1019   ALT 22 08/01/2019 1019   BILITOT 1.3 (H) 08/01/2019 1019     Lab Results  Component Value Date   WBC 7.1 08/01/2019   HGB 11.6 (L) 08/01/2019   HCT 36.5 08/01/2019   MCV 76.3 (L) 08/01/2019   PLT  360.0 08/01/2019   Lab Results  Component Value Date   IRON 20 (L) 08/02/2019   FERRITIN 5.4 (L) 08/02/2019   Lab Results  Component Value Date   HGBA1C 6.4 08/01/2019   Lab Results  Component Value Date   CHOL 153 08/01/2019   HDL 44.60 08/01/2019   LDLCALC 74 08/01/2019   LDLDIRECT 74.0 11/28/2017   TRIG 175.0 (H) 08/01/2019  CHOLHDL 3 08/01/2019   IMPRESSION AND PLAN:  1) Iron def anemia: asymptomatic/no melena or hematochezia. No need to stop Aspirin or plavix at this time--this has been a very slow/gradual process that has led to the anemia, plus she has very significant hx of PAD. Needs to start over the counter iron ->ferrous sulfate 325mg , 1 tab twice per day.   Needs to do home hemoccults x 3 to see if any evidence of current bleeding.    Needs o/v for follow up of this problem in 6 wks and I'll recheck Hb and iron level at that time.  Will have Diane or Hinton Dyer call [facility] and get appt for her to see Dr. Linda Hedges is already an established pt with him.   2) Prediabetes: increase efforts at TLC.  She declined trial of med at this time. We'll repeat A1c in 6 mo (appt already scheduled.  An After Visit Summary was printed and given to the patient.  Spent 15 min with pt today, with >50% of this time spent in counseling and care coordination regarding the above problems.  FOLLOW UP: Return for has appt set for 01/2020 already.  Signed:  Crissie Sickles, MD           08/05/2019

## 2019-08-13 ENCOUNTER — Encounter: Payer: Self-pay | Admitting: Gastroenterology

## 2019-08-13 ENCOUNTER — Ambulatory Visit (INDEPENDENT_AMBULATORY_CARE_PROVIDER_SITE_OTHER): Payer: PPO | Admitting: Gastroenterology

## 2019-08-13 ENCOUNTER — Telehealth: Payer: Self-pay

## 2019-08-13 ENCOUNTER — Other Ambulatory Visit (INDEPENDENT_AMBULATORY_CARE_PROVIDER_SITE_OTHER): Payer: PPO

## 2019-08-13 ENCOUNTER — Other Ambulatory Visit: Payer: Self-pay

## 2019-08-13 VITALS — BP 124/64 | HR 97 | Temp 97.9°F | Ht 63.5 in | Wt 148.4 lb

## 2019-08-13 DIAGNOSIS — Z7902 Long term (current) use of antithrombotics/antiplatelets: Secondary | ICD-10-CM | POA: Diagnosis not present

## 2019-08-13 DIAGNOSIS — K921 Melena: Secondary | ICD-10-CM

## 2019-08-13 DIAGNOSIS — I739 Peripheral vascular disease, unspecified: Secondary | ICD-10-CM | POA: Diagnosis not present

## 2019-08-13 DIAGNOSIS — Z1159 Encounter for screening for other viral diseases: Secondary | ICD-10-CM | POA: Diagnosis not present

## 2019-08-13 DIAGNOSIS — K449 Diaphragmatic hernia without obstruction or gangrene: Secondary | ICD-10-CM | POA: Diagnosis not present

## 2019-08-13 DIAGNOSIS — K219 Gastro-esophageal reflux disease without esophagitis: Secondary | ICD-10-CM | POA: Diagnosis not present

## 2019-08-13 LAB — VITAMIN D 25 HYDROXY (VIT D DEFICIENCY, FRACTURES): VITD: 37.87 ng/mL (ref 30.00–100.00)

## 2019-08-13 MED ORDER — CLENPIQ 10-3.5-12 MG-GM -GM/160ML PO SOLN: 1.0000 | Freq: Once | ORAL | 0 refills | Status: AC

## 2019-08-13 NOTE — Telephone Encounter (Signed)
[name] has an appointment with Dr.Varanosi on 09/11/2019 at 4 pm. This was confirmed in Epic and with the patient. At that appointment, cardiac clearance can be completed for her Colonoscopy and EDG.  Please make sure "Pre-Op Evaluation" is put in her appointment notes. Thank you.

## 2019-08-13 NOTE — Telephone Encounter (Signed)
[facility] Pre-operative Risk Assessment     Request for surgical clearance:     Endoscopy Procedure  What type of surgery is being performed?     Colonoscopy/EGD  When is this surgery scheduled?     09/23/19  What type of clearance is required ?   Pharmacy  Are there any medications that need to be held prior to surgery and how long? Plavix 5 day hold  Practice name and name of physician performing surgery?      [facility]  What is your office phone and fax number?      Phone- [phone]  [phone]  Anesthesia type (None, local, MAC, general) ?       MAC

## 2019-08-13 NOTE — Telephone Encounter (Signed)
Pre-op clearance added to patients appointment note with Dr. Irish Lack on 09/11/19.

## 2019-08-13 NOTE — Patient Instructions (Signed)
You have been scheduled for an endoscopy and colonoscopy. Please follow the written instructions given to you at your visit today. Please pick up your prep supplies at the pharmacy within the next 1-3 days. If you use inhalers (even only as needed), please bring them with you on the day of your procedure. Your physician has requested that you go to www.startemmi.com and enter the access code given to you at your visit today. This web site gives a general overview about your procedure. However, you should still follow specific instructions given to you by our office regarding your preparation for the procedure.  Your provider has requested that you go to the basement level for lab work at [address] in [place] [address]. Press "B" on the elevator. The lab is located at the first door on the left as you exit the elevator.  It was a pleasure to see you today!  Vito Cirigliano, D.O.  

## 2019-08-13 NOTE — Progress Notes (Signed)
P  Chief Complaint:    Iron deficiency anemia  GI History: [name] is a 68 y.o. female with a history of CVA (on Plavix), Right CEA (2007), left carotid stent, right subclavian stent, osteopenia, and longstanding history of GERD complicated by erosive esophagitis, initially referred to me by Dr. Havery Moros for evaluation of possible antireflux intervention with Transoral Incisionless Fundoplication (TIF) is a goal to stop or significantly reduce acid suppression therapy.  Has had reflux for multiple years, with occasional pyrosis but more significantly endorses regurgitation.  Positive nocturnal symptoms of regurgitation/choking which is increasingly bothersome.  Currently taking Protonix 40 mg twice daily and Pepcid 40 mg daily along with avoiding eating within 3 hours of bedtime (eats at 5:30), HOB elevation.  Recently prescribed Carafate  prn as well.  Worse with forward flexion, particularly with regurgitation.  Does endorse intermittent solid food dysphagia,pointing to anterior neck, occurring <once/month, typically cleared with fluids/repeat swallowing.  Does endorse dyspepsia.   GERD history: -Index symptoms: Regurgitation, pyrosis, nocturnal choking/regurgitation, dyspepsia -Medications trialed: Nexium, omeprazole, Carafate prn -Current medications: Protonix 40 mg bid, Pepcid 40 mg qd -Complications: Dysphagia, 5 cm hiatal hernia  GERD evaluation: - CXR (11/2018): Small HH - EGD (02/2019, Dr. Havery Moros): 5 cm HH, Cameron ulceration, fundic gland polyps -Barium esophagram (03/2019): Normal esophagus, moderate sized HH, extensive reflux noted on water siphon test  Was referred to Dr. Kae Heller and Dr. Redmond Pulling at [facility] for consideration of lap hiatal hernia repair with concomitant TIF, but she decided to postpone on surgical intervention.  Endoscopic history: -Colonoscopy approximate 2008 normal per patient - EGD (02/2019, Dr. Havery Moros): 5 cm HH, Cameron ulceration, fundic gland  polyps - Cologuard negative 05/2017   HPI:     Patient is a 68 y.o. female well-known to me for her longstanding history of reflux and large hiatal hernia, presenting today for evaluation of newly diagnosed iron deficiency anemia.  She does report a long-standing hx of hemorrhoids, with intermittent hematochezia. Has had 2 episodes of BRB filling commode and has had to wear pads due to high volume bleeding, but predominantly BRB on tissue paper, once/week. Worse with straining. Can feel hemorhroid prolapse. Has not trialed any medications for this.   Has started ferrous sulfate BID with orange juice. Has had some constipation with iron therapy.  Reflux incompletely controlled despite Protonix 40 mg Bid and Pepcid 40 daily.  Continues to have breakthrough nocturnal sxs 3-4 nights/week and will take an additional Pepcid 20 mg.   Takes ASA/Plavix. Follows with Dr. Trula Slade (Vascular Surgery) and Dr. Irish Lack (Cardiology).   Labs from 08/02/2019: -Iron 20, transferrin 43, sat 3.5%, ferritin 5.4 (none for comparison) -H/H 11.6/36.5 with MCV/RDW 76.3/17.7 (12.9/39.0 with 86.5/15 in 01/2019 and baseline hemoglobin ~14)    Review of systems:     No chest pain, no SOB, no fevers, no urinary sx   Past Medical History:  Diagnosis Date  . Abdominal bruit 04/2017   Aortic u/s showed NO ANEURISM.  Marland Kitchen Allergy    SEASONAL  . Anxiety   . BCC (basal cell carcinoma), arm, right   . Carotid artery occlusion   . Cataract   . Colon cancer screening 05/2017   Colonoscopy reportedly normal approx 2008.  Cologuard NEG--repeat 3 yrs.  Marland Kitchen COPD (chronic obstructive pulmonary disease) ([place])    DOE, worse since 11/2018.  Stress test neg 01/2019.  Albut x 1 inh prn helpful some.  Pulm 06/2019, PFTs not diag of COPD, DOE likely from deconditioning.  Marland Kitchen  Essential hypertension, benign 05/22/2014  . GERD (gastroesophageal reflux disease)   . H/O hiatal hernia 02/2019   EGD-->moderate size->surgery recommended but pt  declined.  . Melanoma ([place])   . Mitral valve prolapse   . Mixed hyperlipidemia 05/22/2014  . Osteopenia 06/06/2016   05/2016 DEXA T-score -1.5.  06/2018 DEXA T score -1.1  . Peripheral vascular disease, unspecified ([place]) 05/22/2014   innominate and carotid.  U/S f/u 12/2016 showed no signif change compared to 2017--vascular recommended repeat 1 yr.  Doing well as of 08/2018 cardiol f/u-->continued on ASA and Plavix.  . Prediabetes 2018   2018 A1c 6.3%.  05/2018 A1c 6.1%. 07/2019 A1c 6.4%.  Marland Kitchen Reflux esophagitis   . Stroke [facility]) 2006   Mini  Stroke- not major  . Tobacco abuse, in remission    quit 2009    Patient's surgical history, family medical history, social history, medications and allergies were all reviewed in Epic    Current Outpatient Medications  Medication Sig Dispense Refill  . aspirin EC 81 MG tablet Take 1 tablet (81 mg total) by mouth daily. 90 tablet 3  . atorvastatin (LIPITOR) 80 MG tablet Take 1 tablet (80 mg total) by mouth daily. 30 tablet 1  . cholecalciferol (VITAMIN D) 1000 UNITS tablet Take 1,000 Units by mouth daily.    . clopidogrel (PLAVIX) 75 MG tablet Take 1 tablet (75 mg total) by mouth daily. 30 tablet 1  . diazepam (VALIUM) 2 MG tablet TAKE ONE TABLET BY MOUTH TWICE DAILY 60 tablet 5  . famotidine (PEPCID) 40 MG tablet TAKE ONE TABLET BY MOUTH DAILY WITH LUNCH 30 tablet 1  . fexofenadine (ALLEGRA) 180 MG tablet Take 180 mg by mouth daily. Take 1 tablet daily for allergies    . lisinopril (ZESTRIL) 5 MG tablet TAKE ONE TABLET BY MOUTH DAILY 30 tablet 2  . Omega-3 Fatty Acids (FISH OIL) 1000 MG CAPS Take 2,000 mg by mouth daily.     . pantoprazole (PROTONIX) 40 MG tablet Take 1 tablet (40 mg total) by mouth 2 (two) times daily. 60 tablet 1  . tiotropium (SPIRIVA HANDIHALER) 18 MCG inhalation capsule Place 1 capsule (18 mcg total) into inhaler and inhale daily. 30 capsule 11   No current facility-administered medications for this visit.     Physical Exam:      BP 124/64   Pulse 97   Temp 97.9 F (36.6 C)   Ht 5' 3.5" (1.613 m)   Wt 148 lb 6 oz (67.3 kg)   BMI 25.87 kg/m   GENERAL:  Pleasant female in NAD PSYCH: : Cooperative, normal affect EENT:  conjunctiva pink, mucous membranes moist, neck supple without masses CARDIAC:  RRR, no murmur heard, no peripheral edema PULM: Normal respiratory effort, lungs CTA bilaterally, no wheezing ABDOMEN:  Nondistended, soft, nontender. No obvious masses, no hepatomegaly,  normal bowel sounds SKIN:  turgor, no lesions seen Musculoskeletal:  Normal muscle tone, normal strength NEURO: Alert and oriented x 3, no focal neurologic deficits Rectal: Exam deferred by patient to time of colonoscopy.    IMPRESSION and PLAN:    1) Iron Deficiency Anemia 2) Hematochezia 3) Hiatal hernia 4) Cameron's ulcer  -Repeat EGD to assess for bleeding Cameron's ulcer, particularly with dual antiplatelet therapy.  Duodenal biopsies -Colonoscopy -Evaluate for degree and severity of hemorrhoids at time of colonoscopy.  Pending endoscopic findings, discussed potentially treating with hemorrhoid band ligation -Check vitamin D for e/o malabsorption - Hold Plavix x5 days -Discussed etiologies for IDA at  length today, to include possible multifactorial nature given hematochezia, high-dose acid suppression, Cameron's ulcer, etc.  5) GERD: -Incompletely controlled despite ongoing high-dose PPI and daily H2 RA, with breakthrough symptoms 3-4 nights/week.  Had previously discussed laparoscopic hiatal hernia repair with possible TIF, which she opted to hold off on due to fear of surgical complication.  Pending further endoscopic work-up, and if still not wanting to proceed with surgical intervention, will plan on making medication change (i.e. Dexilant)  6) Dual antiplatelet therapy 7) History of carotid endarterectomy 8) Peripheral vascular disease Hold Plavix 5 days before procedure - will instruct when and how to resume  after procedure. Low but real risk of cardiovascular event such as heart attack, stroke, embolism, thrombosis or ischemia/infarct of other organs off Plavix explained and need to seek urgent help if this occurs. The patient consents to proceed. Will communicate by phone or EMR with patient's prescribing provider to confirm that holding Plavix is reasonable in this case   The indications, risks, and benefits of EGD and colonoscopy were explained to the patient in detail. Risks include but are not limited to bleeding, perforation, adverse reaction to medications, and cardiopulmonary compromise. Sequelae include but are not limited to the possibility of surgery, hositalization, and mortality. The patient verbalized understanding and wished to proceed. All questions answered, referred to scheduler and bowel prep ordered. Further recommendations pending results of the exam.        Lavena Bullion ,DO, FACG 08/13/2019, 3:09 PM

## 2019-08-19 ENCOUNTER — Other Ambulatory Visit: Payer: Self-pay

## 2019-08-19 ENCOUNTER — Other Ambulatory Visit: Payer: Self-pay | Admitting: Family Medicine

## 2019-08-19 NOTE — Telephone Encounter (Signed)
Requesting: Diazepam Contract: 11/28/18 UDS: 11/28/18 Last Visit: 08/05/19 Next Visit:01/29/20 Last Refill: 01/28/19 (60,5)  Please Advise. Medication pending. Pt's other medications filled on 11/23.

## 2019-08-20 ENCOUNTER — Other Ambulatory Visit: Payer: Self-pay | Admitting: Family Medicine

## 2019-08-20 ENCOUNTER — Encounter: Payer: Self-pay | Admitting: Family Medicine

## 2019-09-02 ENCOUNTER — Encounter: Payer: Self-pay | Admitting: Family Medicine

## 2019-09-03 ENCOUNTER — Ambulatory Visit
Admission: RE | Admit: 2019-09-03 | Discharge: 2019-09-03 | Disposition: A | Discharge status: Home / Self Care | Payer: PPO | Source: Ambulatory Visit | Attending: Family Medicine | Admitting: Family Medicine

## 2019-09-03 ENCOUNTER — Other Ambulatory Visit: Payer: Self-pay

## 2019-09-03 DIAGNOSIS — Z1231 Encounter for screening mammogram for malignant neoplasm of breast: Secondary | ICD-10-CM | POA: Diagnosis not present

## 2019-09-09 ENCOUNTER — Encounter: Payer: Self-pay | Admitting: Gastroenterology

## 2019-09-11 ENCOUNTER — Other Ambulatory Visit: Payer: Self-pay

## 2019-09-11 ENCOUNTER — Encounter: Payer: Self-pay | Admitting: Interventional Cardiology

## 2019-09-11 ENCOUNTER — Ambulatory Visit: Payer: PPO | Admitting: Interventional Cardiology

## 2019-09-11 VITALS — BP 128/74 | HR 75 | Ht 63.5 in | Wt 151.0 lb

## 2019-09-11 DIAGNOSIS — E782 Mixed hyperlipidemia: Secondary | ICD-10-CM

## 2019-09-11 DIAGNOSIS — I700 Atherosclerosis of aorta: Secondary | ICD-10-CM

## 2019-09-11 DIAGNOSIS — I739 Peripheral vascular disease, unspecified: Secondary | ICD-10-CM | POA: Diagnosis not present

## 2019-09-11 DIAGNOSIS — I10 Essential (primary) hypertension: Secondary | ICD-10-CM | POA: Diagnosis not present

## 2019-09-11 NOTE — Patient Instructions (Signed)
Medication Instructions:  Your physician has recommended you make the following change in your medication:   1. STOP Aspirin  2. You may hold you clopidogrel (plavix) starting on 12/23 for your colonoscopy, but will need to restart it as soon as possible afterwards  *If you need a refill on your cardiac medications before your next appointment, please call your pharmacy*  Lab Work: None ordered  If you have labs (blood work) drawn today and your tests are completely normal, you will receive your results only by: Marland Kitchen MyChart Message (if you have MyChart) OR . A paper copy in the mail If you have any lab test that is abnormal or we need to change your treatment, we will call you to review the results.  Testing/Procedures: None ordered  Follow-Up: At [facility], you and your health needs are our priority.  As part of our continuing mission to provide you with exceptional heart care, we have created designated Provider Care Teams.  These Care Teams include your primary Cardiologist (physician) and Advanced Practice Providers (APPs -  Physician Assistants and Nurse Practitioners) who all work together to provide you with the care you need, when you need it.  Your next appointment:   6 month(s)  The format for your next appointment:   Either In Person or Virtual  Provider:   You may see Larae Grooms, MD or one of the following Advanced Practice Providers on your designated Care Team:    Melina Copa, PA-C  Ermalinda Barrios, PA-C   Other Instructions

## 2019-09-11 NOTE — Progress Notes (Signed)
Cardiology Office Note   Date:  09/11/2019   ID:  Laura STANKIEWICZ, DOB [birth year], MRN [id number]  PCP:  Tammi Sou, MD    No chief complaint on file.  PAD  Wt Readings from Last 3 Encounters:  09/11/19 151 lb (68.5 kg)  08/13/19 148 lb 6 oz (67.3 kg)  08/05/19 149 lb (67.6 kg)       History of Present Illness: [name] is a 68 y.o. female  with a family history of significant coronary artery disease. She has a history of early vascular disease and difficult to control hyperlipidemia. She has had significant carotid disease in the past with stents placed in therightinnominate artery 2007)and leftcommon carotid artery (2006).Right CEA in 2008.  She had a cardiac cath in 2013 showing no significant coronary artery disease and only minimal coronary calcification.   Her husband passed away in Oct 04, 2015. He was at work in [place] when he died from a viral illness. It was very quick.  Occasional BP spikes at the MDs office.   She works across the street from the [employer]. She worked during the womens and mens [place] in 2020.  She had a viral infection in 11/2018. She had a chest xray. She was treated with antibiotics. Since then, she has had more DOE. SHe has not been able to get back to her usual self. She gets some DOE with mowing the lawn.   Stress test in 01/2019:  Nuclear stress EF: 56%.  The left ventricular ejection fraction is normal (55-65%).  No T wave inversion was noted during stress.  There was no ST segment deviation noted during stress.  The study is normal.  This is a low risk study.  Normal perfusion. LVEF 56% with normal wall motion. This is a low risk study.  She is getting back to walking.   Since the last visit, she retired. Her iron was low.  She has EGD and colonoscopy are planned on 12/28, with enteroscopy.    She retired from her [employer].   Denies : Chest pain. Dizziness. Leg edema.  Nitroglycerin use. Orthopnea. Palpitations. Paroxysmal nocturnal dyspnea. Syncope.  Some DOE, but not limiting her.     Past Medical History:  Diagnosis Date  . Abdominal bruit 04/2017   Aortic u/s showed NO ANEURISM.  Marland Kitchen Allergy    SEASONAL  . Anxiety   . BCC (basal cell carcinoma), arm, right   . Carotid artery occlusion   . Cataract   . Colon cancer screening 05/2017   Colonoscopy reportedly normal approx 2008.  Cologuard NEG--repeat 3 yrs.  Marland Kitchen COPD (chronic obstructive pulmonary disease) ([place])    DOE, worse since 11/2018.  Stress test neg 01/2019.  Albut x 1 inh prn helpful some.  Pulm 06/2019, PFTs not diag of COPD, DOE likely from deconditioning.  . Essential hypertension, benign 05/22/2014  . GERD (gastroesophageal reflux disease)   . H/O hiatal hernia 02/2019   EGD-->moderate size->surgery recommended but pt declined.  . Iron deficiency anemia 07/2019   Dr. Bryan Lemma to do EGD and colonoscopy. Also could be poor absorption due to chronic high dose acid suppression  . Melanoma ([place])   . Mitral valve prolapse   . Mixed hyperlipidemia 05/22/2014  . Osteopenia 06/06/2016   05/2016 DEXA T-score -1.5.  06/2018 DEXA T score -1.1  . Peripheral vascular disease, unspecified ([place]) 05/22/2014   innominate and carotid.  U/S f/u 12/2016 showed no signif change compared to 2017--vascular recommended  repeat 1 yr.  Doing well as of 08/2018 cardiol f/u-->continued on ASA and Plavix.  . Prediabetes 2018   2018 A1c 6.3%.  05/2018 A1c 6.1%. 07/2019 A1c 6.4%.  Marland Kitchen Reflux esophagitis   . Stroke [facility]) 2006   Mini  Stroke- not major  . Tobacco abuse, in remission    quit 2009    Past Surgical History:  Procedure Laterality Date  . ABDOMINAL HYSTERECTOMY  1997  . CARDIAC CATHETERIZATION  01/2012   NORMAL  . CARDIOVASCULAR STRESS TEST  02/05/2019   NORMAL.  EF normal.  . CAROTID ENDARTERECTOMY  08/21/2007   right - Dr. Amedeo Plenty  . CHOLECYSTECTOMY  2000  . COLONOSCOPY  approx 2008   (Dr.  Lenon Ahmadi pt->normal  . DEXA  06/06/2016   05/2016 T-score -1.5.  06/2018 T score -1.1.  Repeat 2 yrs.  . ESOPHAGOGASTRODUODENOSCOPY  03/11/2019   5 cm hiatal hernia; GI suggested surgical repair due to intractable GER  . Incision and drainage of left thenar space abscess  02/23/15   s/p cat bite (Dr. Amedeo Plenty)  . innominate stent  2007   12/2014 u/s showed patent stent in innominate and L common carotid.  L prox int carotid 40-59% stenosis  . LDCT for lung ca screening  06/2019   NEG->rpt 1 yr  . Left common carotid stent  2006 and 2007   "                    "                  "                          "                             "  . TRANSTHORACIC ECHOCARDIOGRAM  2007   NORMAL  . US CAROTID DOPPLER BILATERAL ([place] [facility])  06/2019   left stent w/out stenosis.  R ICA sten 40-50% obst-stable. Rpt 1 yr.     Current Outpatient Medications  Medication Sig Dispense Refill  . aspirin EC 81 MG tablet Take 1 tablet (81 mg total) by mouth daily. 90 tablet 3  . atorvastatin (LIPITOR) 80 MG tablet Take 1 tablet (80 mg total) by mouth daily. 90 tablet 3  . cholecalciferol (VITAMIN D) 1000 UNITS tablet Take 1,000 Units by mouth daily.    Marland Kitchen CLENPIQ 10-3.5-12 MG-GM -GM/160ML SOLN SMARTSIG:1 Kit(s) By Mouth Once    . clopidogrel (PLAVIX) 75 MG tablet Take 1 tablet (75 mg total) by mouth daily. 90 tablet 3  . diazepam (VALIUM) 2 MG tablet TAKE ONE TABLET BY MOUTH TWICE DAILY 60 tablet 5  . famotidine (PEPCID) 40 MG tablet TAKE ONE TABLET BY MOUTH DAILY WITH LUNCH 30 tablet 1  . fexofenadine (ALLEGRA) 180 MG tablet Take 180 mg by mouth daily. Take 1 tablet daily for allergies    . lisinopril (ZESTRIL) 5 MG tablet TAKE ONE TABLET BY MOUTH DAILY 90 tablet 3  . Omega-3 Fatty Acids (FISH OIL) 1000 MG CAPS Take 2,000 mg by mouth daily.     . pantoprazole (PROTONIX) 40 MG tablet Take 1 tablet (40 mg total) by mouth 2 (two) times daily. 180 tablet 3  . tiotropium (SPIRIVA HANDIHALER) 18 MCG inhalation capsule  Place 1 capsule (18 mcg total) into inhaler and inhale daily. 30 capsule 11  No current facility-administered medications for this visit.    Allergies:   Doxycycline, Fentanyl, Fluocinolone, Meperidine, Penicillins, and Breo ellipta [fluticasone furoate-vilanterol]    Social History:  The patient  reports that she quit smoking about 11 years ago. Her smoking use included cigarettes. She has never used smokeless tobacco. She reports that she does not drink alcohol or use drugs.   Family History:  The patient's family history includes CAD in her mother and paternal grandfather; Heart attack in her mother; Heart disease in her mother; Heart murmur in her sister; Hyperlipidemia in her father and mother; Hypertension in her father and mother; Osteoporosis in her paternal grandmother; Pneumonia in her father.    ROS:  Please see the history of present illness.   Otherwise, review of systems are positive for GERD- chronic.   All other systems are reviewed and negative.    PHYSICAL EXAM: VS:  BP 128/74   Pulse 75   Ht 5' 3.5" (1.613 m)   Wt 151 lb (68.5 kg)   SpO2 99%   BMI 26.33 kg/m  , BMI Body mass index is 26.33 kg/m. GEN: Well nourished, well developed, in no acute distress  HEENT: normal  Neck: no JVD, carotid bruits, or masses Cardiac: RRR; no murmurs, rubs, or gallops,no edema  Respiratory:  clear to auscultation bilaterally, normal work of breathing GI: soft, nontender, nondistended, + BS MS: no deformity or atrophy  Skin: warm and dry, no rash Neuro:  Strength and sensation are intact Psych: euthymic mood, full affect   EKG:   The ekg ordered today demonstrates NSR, no ST segment changes   Recent Labs: 08/01/2019: ALT 22; BUN 15; Creatinine, Ser 0.74; Hemoglobin 11.6; Platelets 360.0; Potassium 4.9; Sodium 139; TSH 1.10   Lipid Panel    Component Value Date/Time   CHOL 153 08/01/2019 1019   TRIG 175.0 (H) 08/01/2019 1019   HDL 44.60 08/01/2019 1019   CHOLHDL 3  08/01/2019 1019   VLDL 35.0 08/01/2019 1019   LDLCALC 74 08/01/2019 1019   LDLDIRECT 74.0 11/28/2017 0828     Other studies Reviewed: Additional studies/ records that were reviewed today with results demonstrating: .   ASSESSMENT AND PLAN:  1.   PAD: Palpable right radial pulse.  No right arm claudication.  LDL target < 100. COntinue statin. OK to stop aspirin, especially given anemia. 2.   Hyperlipidemia: The current medical regimen is effective;  continue present plan and medications. 3.   HTN: The current medical regimen is effective;  continue present plan and medications. 4.   Aortic atherosclerosis:  Aggressive preventive therapy. OK to hold PLavix 5 days prior to colonoscopy.   Current medicines are reviewed at length with the patient today.  The patient concerns regarding her medicines were addressed.  The following changes have been made:  No change  Labs/ tests ordered today include:  No orders of the defined types were placed in this encounter.   Recommend 150 minutes/week of aerobic exercise Low fat, low carb, high fiber diet recommended  Disposition:   FU in 6 months virtual   Signed, Laura Grooms, MD  09/11/2019 4:13 PM    [facility] [place], [place], [place]  [address] Phone: [phone]; Fax: [phone]

## 2019-09-13 NOTE — Telephone Encounter (Signed)
Patient sent the previous message through MyChart-please review OV from 09/11/2019 for Plavix hold instructions-please document that you spoke with the patient regarding this medication hold Thank you

## 2019-09-16 ENCOUNTER — Other Ambulatory Visit: Payer: Self-pay | Admitting: Family Medicine

## 2019-09-16 NOTE — Telephone Encounter (Signed)
I spoke to patient who saw Dr Irish Lack on 09/11/19. She has been cleared to hold her Plavix 5 days prior to her 09/23/19 procedure. All questions answered and patient voiced understanding.

## 2019-09-18 ENCOUNTER — Ambulatory Visit (INDEPENDENT_AMBULATORY_CARE_PROVIDER_SITE_OTHER): Payer: PPO

## 2019-09-18 ENCOUNTER — Other Ambulatory Visit: Payer: Self-pay | Admitting: Gastroenterology

## 2019-09-18 DIAGNOSIS — Z1159 Encounter for screening for other viral diseases: Secondary | ICD-10-CM

## 2019-09-19 LAB — SARS CORONAVIRUS 2 (TAT 6-24 HRS): SARS Coronavirus 2: NEGATIVE

## 2019-09-23 ENCOUNTER — Encounter: Payer: Self-pay | Admitting: Gastroenterology

## 2019-09-23 ENCOUNTER — Ambulatory Visit (AMBULATORY_SURGERY_CENTER): Payer: PPO | Admitting: Gastroenterology

## 2019-09-23 ENCOUNTER — Other Ambulatory Visit: Payer: Self-pay

## 2019-09-23 VITALS — BP 133/74 | HR 84 | Temp 98.1°F | Resp 22 | Ht 63.0 in | Wt 148.0 lb

## 2019-09-23 DIAGNOSIS — K2970 Gastritis, unspecified, without bleeding: Secondary | ICD-10-CM | POA: Diagnosis not present

## 2019-09-23 DIAGNOSIS — K449 Diaphragmatic hernia without obstruction or gangrene: Secondary | ICD-10-CM | POA: Diagnosis not present

## 2019-09-23 DIAGNOSIS — D131 Benign neoplasm of stomach: Secondary | ICD-10-CM

## 2019-09-23 DIAGNOSIS — I10 Essential (primary) hypertension: Secondary | ICD-10-CM | POA: Diagnosis not present

## 2019-09-23 DIAGNOSIS — K621 Rectal polyp: Secondary | ICD-10-CM | POA: Diagnosis not present

## 2019-09-23 DIAGNOSIS — K921 Melena: Secondary | ICD-10-CM

## 2019-09-23 DIAGNOSIS — D509 Iron deficiency anemia, unspecified: Secondary | ICD-10-CM | POA: Diagnosis not present

## 2019-09-23 DIAGNOSIS — K317 Polyp of stomach and duodenum: Secondary | ICD-10-CM

## 2019-09-23 DIAGNOSIS — K635 Polyp of colon: Secondary | ICD-10-CM | POA: Diagnosis not present

## 2019-09-23 DIAGNOSIS — D122 Benign neoplasm of ascending colon: Secondary | ICD-10-CM

## 2019-09-23 DIAGNOSIS — K219 Gastro-esophageal reflux disease without esophagitis: Secondary | ICD-10-CM

## 2019-09-23 DIAGNOSIS — K626 Ulcer of anus and rectum: Secondary | ICD-10-CM

## 2019-09-23 DIAGNOSIS — K5730 Diverticulosis of large intestine without perforation or abscess without bleeding: Secondary | ICD-10-CM

## 2019-09-23 DIAGNOSIS — J449 Chronic obstructive pulmonary disease, unspecified: Secondary | ICD-10-CM | POA: Diagnosis not present

## 2019-09-23 DIAGNOSIS — K641 Second degree hemorrhoids: Secondary | ICD-10-CM

## 2019-09-23 DIAGNOSIS — D128 Benign neoplasm of rectum: Secondary | ICD-10-CM

## 2019-09-23 MED ORDER — SODIUM CHLORIDE 0.9 % IV SOLN: 500.0000 mL | Freq: Once | INTRAVENOUS | Status: DC

## 2019-09-23 NOTE — Patient Instructions (Addendum)
Resume Plavix(clopidogrel) at prior dose in 2 days. Resume high dose acid suppression with Protonix and Pepcid as prescribed. Handouts given on Hiatal Hernia, Gastritis, Hemorrhoids, Diverticulosis, and Polyps You will see some blood in your stool.   YOU HAD AN ENDOSCOPIC PROCEDURE TODAY AT [facility]:   Refer to the procedure report that was given to you for any specific questions about what was found during the examination.  If the procedure report does not answer your questions, please call your gastroenterologist to clarify.  If you requested that your care partner not be given the details of your procedure findings, then the procedure report has been included in a sealed envelope for you to review at your convenience later.  YOU SHOULD EXPECT: Some feelings of bloating in the abdomen. Passage of more gas than usual.  Walking can help get rid of the air that was put into your GI tract during the procedure and reduce the bloating. If you had a lower endoscopy (such as a colonoscopy or flexible sigmoidoscopy) you may notice spotting of blood in your stool or on the toilet paper. If you underwent a bowel prep for your procedure, you may not have a normal bowel movement for a few days.  Please Note:  You might notice some irritation and congestion in your nose or some drainage.  This is from the oxygen used during your procedure.  There is no need for concern and it should clear up in a day or so.  SYMPTOMS TO REPORT IMMEDIATELY:   Following lower endoscopy (colonoscopy or flexible sigmoidoscopy):  Excessive amounts of blood in the stool  Significant tenderness or worsening of abdominal pains  Swelling of the abdomen that is new, acute  Fever of 100F or higher   Following upper endoscopy (EGD)  Vomiting of blood or coffee ground material  New chest pain or pain under the shoulder blades  Painful or persistently difficult swallowing  New shortness of breath  Fever of 100F  or higher  Black, tarry-looking stools  For urgent or emergent issues, a gastroenterologist can be reached at any hour by calling [phone].   DIET:  We do recommend a small meal at first, but then you may proceed to your regular diet.  Drink plenty of fluids but you should avoid alcoholic beverages for 24 hours.  ACTIVITY:  You should plan to take it easy for the rest of today and you should NOT DRIVE or use heavy machinery until tomorrow (because of the sedation medicines used during the test).    FOLLOW UP: Our staff will call the number listed on your records 48-72 hours following your procedure to check on you and address any questions or concerns that you may have regarding the information given to you following your procedure. If we do not reach you, we will leave a message.  We will attempt to reach you two times.  During this call, we will ask if you have developed any symptoms of COVID 19. If you develop any symptoms (ie: fever, flu-like symptoms, shortness of breath, cough etc.) before then, please call [phone].  If you test positive for Covid 19 in the 2 weeks post procedure, please call and report this information to [place].    If any biopsies were taken you will be contacted by phone or by letter within the next 1-3 weeks.  Please call us at [phone] if you have not heard about the biopsies in 3 weeks.    SIGNATURES/CONFIDENTIALITY:  You and/or your care partner have signed paperwork which will be entered into your electronic medical record.  These signatures attest to the fact that that the information above on your After Visit Summary has been reviewed and is understood.  Full responsibility of the confidentiality of this discharge information lies with you and/or your care-partner.

## 2019-09-23 NOTE — Op Note (Signed)
[facility] Patient Name: [name] Procedure Date: 09/23/2019 10:39 AM MRN: [id number] Endoscopist: Gerrit Heck , MD Age: 68 Referring MD:  Date of Birth: May 09, 1951 Gender: Female Account #: [id number] Procedure:                Upper GI endoscopy Indications:              Iron deficiency anemia, Esophageal reflux, Hiatal                            hernia                           68 yo female with long-standing history of GERD and                            large hiatal hernia with Cameron's ulcer in 02/2019,                            currently treated with Protonix 40 mg BID and                            Pepcid 40 mg daily, presents with IDA. Still with                            intermittent breakthrough reflux symptoms despite                            high dose acid suppression therapy. Medicines:                Monitored Anesthesia Care Procedure:                Pre-Anesthesia Assessment:                           - Prior to the procedure, a History and Physical                            was performed, and patient medications and                            allergies were reviewed. The patient's tolerance of                            previous anesthesia was also reviewed. The risks                            and benefits of the procedure and the sedation                            options and risks were discussed with the patient.                            All questions were answered, and informed consent  was obtained. Prior Anticoagulants: The patient has                            taken Plavix (clopidogrel), last dose was 6 days                            prior to procedure. ASA Grade Assessment: III - A                            patient with severe systemic disease. After                            reviewing the risks and benefits, the patient was                            deemed in satisfactory condition to undergo the                           procedure.                           After obtaining informed consent, the endoscope was                            passed under direct vision. Throughout the                            procedure, the patient's blood pressure, pulse, and                            oxygen saturations were monitored continuously. The                            Endoscope was introduced through the mouth, and                            advanced to the second part of duodenum. The upper                            GI endoscopy was accomplished without difficulty.                            The patient tolerated the procedure well. Scope In: Scope Out: Findings:                 Esophagogastric landmarks were identified: the                            Z-line was found at 30 cm, the gastroesophageal                            junction was found at 30 cm and the site of hiatal  narrowing was found at 35 cm from the incisors.                           The Z-line was regular and was found 30 cm from the                            incisors.                           A 5 cm hiatal hernia was present. No Cameron's                            ulcers noted.                           The gastroesophageal flap valve was visualized                            endoscopically and classified as Hill Grade IV (no                            fold, wide open lumen, hiatal hernia present).                           The upper third of the esophagus and middle third                            of the esophagus were normal.                           A few small sessile polyps with no bleeding and no                            stigmata of recent bleeding were found in the                            gastric fundus and in the gastric body. These were                            previously resected/sampled and consistent with                            benign fundic gland polyps. Additional  biopsies not                            performed today on these benign appearing polyps.                           A single 8 mm sessile polyp with no bleeding and no                            stigmata of recent bleeding was found in the  gastric antrum. The polyp was removed with a cold                            snare. Resection and retrieval were complete.                            Estimated blood loss was minimal.                           Scattered mild inflammation characterized by                            erythema was found in the gastric body and in the                            gastric antrum. No gastric ulcers noted. Biopsies                            were taken with a cold forceps for Helicobacter                            pylori testing. Estimated blood loss was minimal.                           The duodenal bulb, first portion of the duodenum                            and second portion of the duodenum were normal.                            Biopsies for histology were taken with a cold                            forceps for evaluation of celiac disease. Estimated                            blood loss was minimal. Complications:            No immediate complications. Estimated Blood Loss:     Estimated blood loss was minimal. Impression:               - Esophagogastric landmarks identified.                           - Z-line regular, 30 cm from the incisors.                           - 5 cm hiatal hernia.                           - Gastroesophageal flap valve classified as Hill                            Grade IV (no fold, wide open lumen, hiatal hernia  present).                           - Normal upper third of esophagus and middle third                            of esophagus.                           - A few gastric polyps.                           - A single gastric polyp. Resected and retrieved.                            - Gastritis. Biopsied.                           - Normal duodenal bulb, first portion of the                            duodenum and second portion of the duodenum.                            Biopsied. Recommendation:           - Patient has a contact number available for                            emergencies. The signs and symptoms of potential                            delayed complications were discussed with the                            patient. Return to normal activities tomorrow.                            Written discharge instructions were provided to the                            patient.                           - Resume previous diet.                           - Resume Plavix (clopidogrel) at prior dose in 2                            days.                           - Await pathology results.                           - Return to GI clinic at appointment to be  scheduled.                           - Resume high dose acid suppression with Protonix                            and Pepcid as prescribed. If still incompletely                            controlled, will discuss changing Protonix to                            Dexilant at follow-up appointment.                           - Colonoscopy today. Gerrit Heck, MD 09/23/2019 11:30:18 AM

## 2019-09-23 NOTE — Op Note (Signed)
[facility] Patient Name: [name] Procedure Date: 09/23/2019 10:39 AM MRN: [id number] Endoscopist: Gerrit Heck , MD Age: 68 Referring MD:  Date of Birth: 1951-04-21 Gender: Female Account #: [id number] Procedure:                Colonoscopy Indications:              Hematochezia, Iron deficiency anemia                           68 yo female with newly diagnosed IDA. Does have                            intermittent painless hematochezia. Last                            colonoscopy was in 2008 (normal) and ColoGuard                            negative in 2018. Medicines:                Monitored Anesthesia Care Procedure:                Pre-Anesthesia Assessment:                           - Prior to the procedure, a History and Physical                            was performed, and patient medications and                            allergies were reviewed. The patient's tolerance of                            previous anesthesia was also reviewed. The risks                            and benefits of the procedure and the sedation                            options and risks were discussed with the patient.                            All questions were answered, and informed consent                            was obtained. Prior Anticoagulants: The patient has                            taken Plavix (clopidogrel), last dose was 5 days                            prior to procedure. ASA Grade Assessment: III - A  patient with severe systemic disease. After                            reviewing the risks and benefits, the patient was                            deemed in satisfactory condition to undergo the                            procedure.                           After obtaining informed consent, the colonoscope                            was passed under direct vision. Throughout the                            procedure, the patient's  blood pressure, pulse, and                            oxygen saturations were monitored continuously. The                            Colonoscope was introduced through the anus and                            advanced to the the terminal ileum. The colonoscopy                            was performed without difficulty. The patient                            tolerated the procedure well. The quality of the                            bowel preparation was adequate. The terminal ileum,                            ileocecal valve, appendiceal orifice, and rectum                            were photographed. Scope In: 10:53:49 AM Scope Out: 11:20:43 AM Scope Withdrawal Time: 0 hours 23 minutes 55 seconds  Total Procedure Duration: 0 hours 26 minutes 54 seconds  Findings:                 Hemorrhoids were found on perianal exam.                           Two sessile polyps were found in the ascending                            colon. The polyps were 3 to 4 mm in size. These  polyps were removed with a cold snare. Resection                            and retrieval were complete. Estimated blood loss                            was minimal.                           Four sessile polyps were found in the recto-sigmoid                            colon. The polyps were 1 to 3 mm in size. These                            polyps were removed with a cold biopsy forceps.                            Resection and retrieval were complete. Estimated                            blood loss was minimal.                           A few small-mouthed diverticula were found in the                            sigmoid colon.                           Non-bleeding internal hemorrhoids were found during                            retroflexion. The hemorrhoids were medium-sized.                           Retroflexion in rectum notable for hypertrophied                            anal papilla as  well as an area of localized,                            friable, inflammed mucosa located immediately                            adjacent to the detate line. Several mucosal                            biopsies were taken with a cold forceps for                            histology. Estimated blood loss was minimal.                           The terminal  ileum appeared normal. Complications:            No immediate complications. Estimated Blood Loss:     Estimated blood loss was minimal. Impression:               - Hemorrhoids found on perianal exam.                           - Two 3 to 4 mm polyps in the ascending colon,                            removed with a cold snare. Resected and retrieved.                           - Four 1 to 3 mm polyps at the recto-sigmoid colon,                            removed with a cold biopsy forceps. Resected and                            retrieved.                           - Diverticulosis in the sigmoid colon.                           - Non-bleeding internal hemorrhoids.                           - Anal papilla(e) were hypertrophied. Biopsied.                           - The examined portion of the ileum was normal. Recommendation:           - Patient has a contact number available for                            emergencies. The signs and symptoms of potential                            delayed complications were discussed with the                            patient. Return to normal activities tomorrow.                            Written discharge instructions were provided to the                            patient.                           - Resume previous diet.                           - Resume Plavix (clopidogrel) at prior dose in 2  days.                           - Await pathology results.                           - Repeat colonoscopy in 3 - 5 years for                            surveillance based on  pathology results.                           - Return to GI clinic at appointment to be                            scheduled.                           - Internal hemorrhoids were noted on this study                            and, pending results of rectal biopsy, may be                            amenable to hemorrhoid band ligation. If you are                            interested in further treatment of these                            hemorrhoids with band ligation, please contact my                            clinic to set up an appointment for evaluation and                            treatment.                           - Repeat CBC and iron panel in 2-3 months.                           - If ongoing IDA, and depending on biopsy results,                            may consider small bowel interrogation with Video                            [facility] Endoscopy. Gerrit Heck, MD 09/23/2019 11:39:58 AM

## 2019-09-23 NOTE — Progress Notes (Signed)
Report given to PACU, vss 

## 2019-09-23 NOTE — Progress Notes (Signed)
Called to room to assist during endoscopic procedure.  Patient ID and intended procedure confirmed with present staff. Received instructions for my participation in the procedure from the performing physician.  

## 2019-09-25 ENCOUNTER — Other Ambulatory Visit: Payer: Self-pay

## 2019-09-25 ENCOUNTER — Telehealth: Payer: Self-pay

## 2019-09-25 DIAGNOSIS — D509 Iron deficiency anemia, unspecified: Secondary | ICD-10-CM

## 2019-09-25 DIAGNOSIS — K219 Gastro-esophageal reflux disease without esophagitis: Secondary | ICD-10-CM

## 2019-09-25 DIAGNOSIS — K921 Melena: Secondary | ICD-10-CM

## 2019-09-25 NOTE — Telephone Encounter (Signed)
  Follow up Call-  Call back number 09/23/2019 03/11/2019  Post procedure Call Back phone  # [phone]  Permission to leave phone message Yes Yes  Some recent data might be hidden     Patient questions:  Do you have a fever, pain , or abdominal swelling? No. Pain Score  0 *  Have you tolerated food without any problems? Yes.    Have you been able to return to your normal activities? Yes.    Do you have any questions about your discharge instructions: Diet   No. Medications  No. Follow up visit  No.  Do you have questions or concerns about your Care? No.  Actions: * If pain score is 4 or above: No action needed, pain <4. 1. Have you developed a fever since your procedure? no  2.   Have you had an respiratory symptoms (SOB or cough) since your procedure? no  3.   Have you tested positive for COVID 19 since your procedure no  4.   Have you had any family members/close contacts diagnosed with the COVID 19 since your procedure?  no   If yes to any of these questions please route to Joylene John, RN and Alphonsa Gin, Therapist, sports.

## 2019-09-26 ENCOUNTER — Encounter: Payer: Self-pay | Admitting: Gastroenterology

## 2019-09-26 ENCOUNTER — Ambulatory Visit (INDEPENDENT_AMBULATORY_CARE_PROVIDER_SITE_OTHER): Payer: PPO | Admitting: Family Medicine

## 2019-09-26 ENCOUNTER — Other Ambulatory Visit: Payer: Self-pay

## 2019-09-26 ENCOUNTER — Encounter: Payer: Self-pay | Admitting: Family Medicine

## 2019-09-26 VITALS — BP 133/74 | Temp 98.4°F | Wt 145.0 lb

## 2019-09-26 DIAGNOSIS — H1033 Unspecified acute conjunctivitis, bilateral: Secondary | ICD-10-CM | POA: Diagnosis not present

## 2019-09-26 MED ORDER — PREDNISONE 20 MG PO TABS: ORAL_TABLET | ORAL | 0 refills | Status: DC

## 2019-09-26 NOTE — Progress Notes (Signed)
Virtual Visit via Video Note  I connected with pt on 09/26/19 at  9:30 AM EST by a video enabled telemedicine application and verified that I am speaking with the correct person using two identifiers.  Location patient: home Location provider:work or home office Persons participating in the virtual visit: patient, provider  I discussed the limitations of evaluation and management by telemedicine and the availability of in person appointments. The patient expressed understanding and agreed to proceed.  Telemedicine visit is a necessity given the COVID-19 restrictions in place at the current time.  HPI: 68 y/o WF being seen today for eye complaints. Onset yesterday morning, put makeup on (nothing new, but doesn't wear makeup much anymore at all), then 15 in later both eyes very itching, some crusting and white mucous noted. Her eyes do burn.  Eyelashes look normal to her, feel normal to her.  Eye balls  And internal portion of eyelids w/out redness. Some sneezing and stuffy nose but she feels like this is from her gas/dry heat.  ROS: no fever, cough, sob, n/v/d, or rash.  Past Medical History:  Diagnosis Date  . Abdominal bruit 04/2017   Aortic u/s showed NO ANEURISM.  Marland Kitchen Allergy    SEASONAL  . Anxiety   . BCC (basal cell carcinoma), arm, right   . Carotid artery occlusion   . Cataract   . Colon cancer screening 05/2017   Colonoscopy reportedly normal approx 2008.  Cologuard NEG--repeat 3 yrs.  Marland Kitchen COPD (chronic obstructive pulmonary disease) ([place])    DOE, worse since 11/2018.  Stress test neg 01/2019.  Albut x 1 inh prn helpful some.  Pulm 06/2019, PFTs not diag of COPD, DOE likely from deconditioning.  . Essential hypertension, benign 05/22/2014  . GERD (gastroesophageal reflux disease)   . H/O hiatal hernia 02/2019   EGD-->moderate size->surgery recommended but pt declined.  . Iron deficiency anemia 07/2019   Dr. Bryan Lemma to do EGD and colonoscopy. Also could be poor absorption  due to chronic high dose acid suppression  . Melanoma ([place])   . Mitral valve prolapse   . Mixed hyperlipidemia 05/22/2014  . Osteopenia 06/06/2016   05/2016 DEXA T-score -1.5.  06/2018 DEXA T score -1.1  . Peripheral vascular disease, unspecified ([place]) 05/22/2014   innominate and carotid.  U/S f/u 12/2016 showed no signif change compared to 2017--vascular recommended repeat 1 yr.  Doing well as of 08/2018 cardiol f/u-->continued on ASA and Plavix.  . Prediabetes 2018   2018 A1c 6.3%.  05/2018 A1c 6.1%. 07/2019 A1c 6.4%.  Marland Kitchen Reflux esophagitis   . Stroke [facility]) 2006   Mini  Stroke- not major  . Tobacco abuse, in remission    quit 2009    Past Surgical History:  Procedure Laterality Date  . ABDOMINAL HYSTERECTOMY  1997  . CARDIAC CATHETERIZATION  01/2012   NORMAL  . CARDIOVASCULAR STRESS TEST  02/05/2019   NORMAL.  EF normal.  . CAROTID ENDARTERECTOMY  08/21/2007   right - Dr. Amedeo Plenty  . CHOLECYSTECTOMY  2000  . COLONOSCOPY  approx 2008   (Dr. Lenon Ahmadi pt->normal  . DEXA  06/06/2016   05/2016 T-score -1.5.  06/2018 T score -1.1.  Repeat 2 yrs.  . ESOPHAGOGASTRODUODENOSCOPY  03/11/2019   5 cm hiatal hernia; GI suggested surgical repair due to intractable GER  . Incision and drainage of left thenar space abscess  02/23/15   s/p cat bite (Dr. Amedeo Plenty)  . innominate stent  2007   12/2014 u/s showed patent stent in  innominate and L common carotid.  L prox int carotid 40-59% stenosis  . LDCT for lung ca screening  06/2019   NEG->rpt 1 yr  . Left common carotid stent  2006 and 2007   "                    "                  "                          "                             "  . TRANSTHORACIC ECHOCARDIOGRAM  2007   NORMAL  . US CAROTID DOPPLER BILATERAL ([place] [facility])  06/2019   left stent w/out stenosis.  R ICA sten 40-50% obst-stable. Rpt 1 yr.    Family History  Problem Relation Age of Onset  . Heart attack Mother   . CAD Mother   . Heart disease Mother        Before age 60  .  Hypertension Mother   . Hyperlipidemia Mother   . Hypertension Father   . Pneumonia Father   . Hyperlipidemia Father   . Heart murmur Sister   . Osteoporosis Paternal Grandmother   . CAD Paternal Grandfather   . Breast cancer Neg Hx   . Colon cancer Neg Hx   . Esophageal cancer Neg Hx   . Rectal cancer Neg Hx   . Stomach cancer Neg Hx   . Colon polyps Neg Hx      Current Outpatient Medications:  .  atorvastatin (LIPITOR) 80 MG tablet, Take 1 tablet (80 mg total) by mouth daily., Disp: 90 tablet, Rfl: 3 .  cholecalciferol (VITAMIN D) 1000 UNITS tablet, Take 1,000 Units by mouth daily., Disp: , Rfl:  .  clopidogrel (PLAVIX) 75 MG tablet, Take 1 tablet (75 mg total) by mouth daily., Disp: 90 tablet, Rfl: 3 .  diazepam (VALIUM) 2 MG tablet, TAKE ONE TABLET BY MOUTH TWICE DAILY, Disp: 60 tablet, Rfl: 5 .  famotidine (PEPCID) 40 MG tablet, TAKE ONE TABLET BY MOUTH DAILY WITH LUNCH, Disp: 30 tablet, Rfl: 0 .  ferrous sulfate 325 (65 FE) MG tablet, Take 325 mg by mouth 2 (two) times daily., Disp: , Rfl:  .  fexofenadine (ALLEGRA) 180 MG tablet, Take 180 mg by mouth daily. Take 1 tablet daily for allergies, Disp: , Rfl:  .  lisinopril (ZESTRIL) 5 MG tablet, TAKE ONE TABLET BY MOUTH DAILY, Disp: 90 tablet, Rfl: 3 .  Magnesium 500 MG CAPS, Take by mouth., Disp: , Rfl:  .  Omega-3 Fatty Acids (FISH OIL) 1000 MG CAPS, Take 2,000 mg by mouth daily. , Disp: , Rfl:  .  pantoprazole (PROTONIX) 40 MG tablet, Take 1 tablet (40 mg total) by mouth 2 (two) times daily., Disp: 180 tablet, Rfl: 3 .  tiotropium (SPIRIVA HANDIHALER) 18 MCG inhalation capsule, Place 1 capsule (18 mcg total) into inhaler and inhale daily., Disp: 30 capsule, Rfl: 11  EXAM:  VITALS per patient if applicable: BP 0000000   Temp 98.4 F (36.9 C)   Wt 145 lb (65.8 kg)   BMI 25.69 kg/m    GENERAL: alert, oriented, appears well and in no acute distress  HEENT: atraumatic, conjunttiva clear, no obvious abnormalities on  inspection of external nose and ears  Eyes w/out erythema, swelling, or exudate.  Lashes appear normal.  Bulbar and palpebral conjunctiva appear w/out injection. EOMI, pupils equal and reactive.  NECK: normal movements of the head and neck  LUNGS: on inspection no signs of respiratory distress, breathing rate appears normal, no obvious gross SOB, gasping or wheezing  CV: no obvious cyanosis  MS: moves all visible extremities without noticeable abnormality  PSYCH/NEURO: pleasant and cooperative, no obvious depression or anxiety, speech and thought processing grossly intact  LABS: none today    Chemistry      Component Value Date/Time   NA 139 08/01/2019 1019   K 4.9 08/01/2019 1019   CL 105 08/01/2019 1019   CO2 26 08/01/2019 1019   BUN 15 08/01/2019 1019   CREATININE 0.74 08/01/2019 1019      Component Value Date/Time   CALCIUM 9.9 08/01/2019 1019   ALKPHOS 113 08/01/2019 1019   AST 20 08/01/2019 1019   ALT 22 08/01/2019 1019   BILITOT 1.3 (H) 08/01/2019 1019     ASSESSMENT AND PLAN:  Discussed the following assessment and plan:  Conjunctivitis; irritative/allergic (her makeup)-->no symptoms or signs to suggest infectious. Recommended otc zaditor eye drops q12h prn. Also sent in prednisone 20mg  to take 1 tab qd x 3d.  -we discussed possible serious and likely etiologies, options for evaluation and workup, limitations of telemedicine visit vs in person visit, treatment, treatment risks and precautions. Pt prefers to treat via telemedicine empirically rather then risking or undertaking an in person visit at this moment. Patient agrees to seek prompt in person care if worsening, new symptoms arise, or if is not improving with treatment.   I discussed the assessment and treatment plan with the patient. The patient was provided an opportunity to ask questions and all were answered. The patient agreed with the plan and demonstrated an understanding of the instructions.   The  patient was advised to call back or seek an in-person evaluation if the symptoms worsen or if the condition fails to improve as anticipated.  F/u: prn  Signed:  Crissie Sickles, MD           09/26/2019

## 2019-10-01 ENCOUNTER — Encounter: Payer: Self-pay | Admitting: Family Medicine

## 2019-10-23 DIAGNOSIS — Z23 Encounter for immunization: Secondary | ICD-10-CM | POA: Diagnosis not present

## 2019-10-24 ENCOUNTER — Other Ambulatory Visit: Payer: Self-pay

## 2019-10-24 ENCOUNTER — Encounter: Payer: Self-pay | Admitting: Gastroenterology

## 2019-10-24 ENCOUNTER — Ambulatory Visit: Payer: PPO | Admitting: Gastroenterology

## 2019-10-24 VITALS — BP 126/84 | HR 91 | Temp 97.3°F | Ht 64.0 in | Wt 152.2 lb

## 2019-10-24 DIAGNOSIS — K649 Unspecified hemorrhoids: Secondary | ICD-10-CM

## 2019-10-24 DIAGNOSIS — I739 Peripheral vascular disease, unspecified: Secondary | ICD-10-CM | POA: Diagnosis not present

## 2019-10-24 DIAGNOSIS — D509 Iron deficiency anemia, unspecified: Secondary | ICD-10-CM | POA: Diagnosis not present

## 2019-10-24 DIAGNOSIS — Z86010 Personal history of colonic polyps: Secondary | ICD-10-CM

## 2019-10-24 DIAGNOSIS — K921 Melena: Secondary | ICD-10-CM

## 2019-10-24 DIAGNOSIS — Z7902 Long term (current) use of antithrombotics/antiplatelets: Secondary | ICD-10-CM | POA: Diagnosis not present

## 2019-10-24 DIAGNOSIS — K449 Diaphragmatic hernia without obstruction or gangrene: Secondary | ICD-10-CM

## 2019-10-24 DIAGNOSIS — K641 Second degree hemorrhoids: Secondary | ICD-10-CM

## 2019-10-24 NOTE — Patient Instructions (Addendum)
Start taking your iron tablet every other day  Please review the hemorrhoid banding handout  It was a pleasure to see you today!  Return in 3-6 months  [place], D.O.

## 2019-10-24 NOTE — Progress Notes (Signed)
;   P  Chief Complaint:    GERD, iron deficiency anemia, hematochezia, hemorrhoids  GI History: Laura A Sheltonis a 69 y.o.femalewith a history of CVA (on Plavix),Right CEA (2007), left carotid stent, right subclavian stent,osteopenia, and longstanding history of GERD complicated by erosive esophagitis, initiallyreferred tomeby Dr. Lady Deutscher evaluation ofpossible antireflux intervention with Transoral IncisionlessFundoplication (TIF) is a goal to stop or significantly reduce acid suppression therapy.  Has had reflux for multiple years, with occasional pyrosis but more significantly endorses regurgitation. Positive nocturnal symptoms of regurgitation/choking which is increasingly bothersome. Currently taking Protonix 40 mg twice daily and Pepcid 40 mg daily along with avoiding eating within 3 hours of bedtime (eats at 5:30), HOB elevation. Recently prescribed Carafate prnas well. Worse with forward flexion, particularly with regurgitation. Does endorse intermittent solid food dysphagia,pointing to anterior neck,occurring <once/month, typically cleared with fluids/repeat swallowing. Does endorse dyspepsia.   GERD history: -Index symptoms: Regurgitation, pyrosis, nocturnal choking/regurgitation, dyspepsia -Medications trialed: Nexium, omeprazole, Carafate prn -Current medications: Protonix 40 mgbid,Pepcid 40 mgqd -Complications: Dysphagia,5 cmhiatal hernia  GERD evaluation: -CXR (11/2018): Small HH -EGD (02/2019, Dr. Havery Moros): 5 cm HH, Cameron ulceration, fundic gland polyps -Barium esophagram (03/2019): Normal esophagus, moderate sized HH, extensive reflux noted on water siphon test  Was referred to Dr. Kae Heller and Dr. Redmond Pulling at [place] for consideration of lap hiatal hernia repair with concomitant TIF, but she decided to postpone on surgical intervention.   Iron deficiency anemia diagnosed in 07/2019: -Iron 20, transferrin 43, sat 3.5%, ferritin 5.4 (none for  comparison) -H/H 11.6/36.5 with MCV/RDW 76.3/17.7 (12.9/39.0 with 86.5/15 in 01/2019 and baseline hemoglobin ~14) -Started on ferrous sulfate bid 07/2019 (some constipation)  Endoscopic history: -Colonoscopy approximate 2008 normal per patient -EGD (02/2019, Dr. Havery Moros): 5 cm HH, Cameron ulceration, fundic gland polyps - Cologuard negative 05/2017 -EGD (08/2019, Dr. Bryan Lemma): 5 cm HH, no Cameron's ulcer, Hill grade 4, fundic gland polyps, 8 mm antral HP resected, mild non-H. pylori gastritis, normal duodenum with normal biopsies -Colonoscopy (08/2019, Dr. Bryan Lemma): 6 polyps (SSP x2, HP x4), internal hemorrhoids, sigmoid diverticulosis.  Normal TI.  Repeat 5 years  HPI:     Patient is a 69 y.o. female presenting [facility] for follow-up.  Was last seen by me in 07/2019 for evaluation of newly diagnosed IDA.  Did report intermittent hemorrhoidal bleeding in the past, but at least 2 episodes of BRB feeling commode and need to wear pads due to high volume bleeding.  Was evaluated with repeat EGD along with colonoscopy, as outlined above.  No active bleeding.  Medium sized internal hemorrhoids.  At the time of last appointment, reflux was still incompletely controlled despite Protonix 40 mg bid and Carafate 40 mg/day, requiring an additional Pepcid 20 mg for breakthrough 3-4 nights/week.  Had planned for repeat CBC, iron panel 2-3 months after starting oral iron therapy, and if still iron deficient, plan for VCE with small bowel interrogation.   Received first dose of Covid vaccine yesterday.  No adverse reactions.  Today, states reflux overall unchanged. Reduced need for Pepcid 20 mg for breakthrough to only 1 time in last month or so. Has another episide of large volume hematochezia 2 weeks ago.   Has since stopped ASA in 08/2019. Continues to take Plavix daily.    No new labs since last appointment.    Review of systems:     No chest pain, no SOB, no fevers,  no urinary sx   Past Medical History:  Diagnosis Date  . Abdominal  bruit 04/2017   Aortic u/s showed NO ANEURISM.  Marland Kitchen Allergy    SEASONAL  . Anxiety   . BCC (basal cell carcinoma), arm, right   . Carotid artery occlusion   . Cataract   . Colon cancer screening 05/2017   Colonoscopy reportedly normal approx 2008.  Cologuard NEG--repeat 3 yrs.  Marland Kitchen COPD (chronic obstructive pulmonary disease) ([place])    DOE, worse since 11/2018.  Stress test neg 01/2019.  Albut x 1 inh prn helpful some.  Pulm 06/2019, PFTs not diag of COPD, DOE likely from deconditioning.  . Essential hypertension, benign 05/22/2014  . GERD (gastroesophageal reflux disease)   . H/O hiatal hernia 02/2019   EGD-->moderate size->surgery recommended but pt declined.  . Iron deficiency anemia 07/2019   Dr. Bryan Lemma EGD and colonoscopy, some anorectal muscosal ulceration, o/w just mild chronic gastritis.   Also could be poor absorption due to chronic high dose acid suppression  . Melanoma ([place])   . Mitral valve prolapse   . Mixed hyperlipidemia 05/22/2014  . Osteopenia 06/06/2016   05/2016 DEXA T-score -1.5.  06/2018 DEXA T score -1.1  . Peripheral vascular disease, unspecified ([place]) 05/22/2014   innominate and carotid.  U/S f/u 12/2016 showed no signif change compared to 2017--vascular recommended repeat 1 yr.  Doing well as of 08/2018 cardiol f/u-->continued on ASA and Plavix. F/u 08/2019->ASA d/c'd, plavix continued.  . Prediabetes 2018   2018 A1c 6.3%.  05/2018 A1c 6.1%. 07/2019 A1c 6.4%.  Marland Kitchen Reflux esophagitis   . Stroke [facility]) 2006   Mini  Stroke- not major  . Tobacco abuse, in remission    quit 2009    Patient's surgical history, family medical history, social history, medications and allergies were all reviewed in Epic    Current Outpatient Medications  Medication Sig Dispense Refill  . atorvastatin (LIPITOR) 80 MG tablet Take 1 tablet (80 mg total) by mouth daily. 90 tablet 3  . cholecalciferol (VITAMIN D) 1000  UNITS tablet Take 1,000 Units by mouth daily.    . clopidogrel (PLAVIX) 75 MG tablet Take 1 tablet (75 mg total) by mouth daily. 90 tablet 3  . diazepam (VALIUM) 2 MG tablet TAKE ONE TABLET BY MOUTH TWICE DAILY 60 tablet 5  . famotidine (PEPCID) 40 MG tablet TAKE ONE TABLET BY MOUTH DAILY WITH LUNCH 30 tablet 0  . ferrous sulfate 325 (65 FE) MG tablet Take 325 mg by mouth 2 (two) times daily.    . fexofenadine (ALLEGRA) 180 MG tablet Take 180 mg by mouth daily. Take 1 tablet daily for allergies    . lisinopril (ZESTRIL) 5 MG tablet TAKE ONE TABLET BY MOUTH DAILY 90 tablet 3  . Magnesium 500 MG CAPS Take by mouth.    . Omega-3 Fatty Acids (FISH OIL) 1000 MG CAPS Take 2,000 mg by mouth daily.     . pantoprazole (PROTONIX) 40 MG tablet Take 1 tablet (40 mg total) by mouth 2 (two) times daily. 180 tablet 3  . tiotropium (SPIRIVA HANDIHALER) 18 MCG inhalation capsule Place 1 capsule (18 mcg total) into inhaler and inhale daily. 30 capsule 11   No current facility-administered medications for this visit.    Physical Exam:     BP 126/84   Pulse 91   Temp (!) 97.3 F (36.3 C)   Ht 5\' 4"  (1.626 m)   Wt 152 lb 4 oz (69.1 kg)   BMI 26.13 kg/m   GENERAL:  Pleasant female in NAD PSYCH: : Cooperative, normal affect EENT:  conjunctiva pink, mucous membranes moist, neck supple without masses CARDIAC:  RRR, no murmur heard, no peripheral edema PULM: Normal respiratory effort, lungs CTA bilaterally, no wheezing ABDOMEN:  Nondistended, soft, nontender. No obvious masses, no hepatomegaly,  normal bowel sounds SKIN:  turgor, no lesions seen Musculoskeletal:  Normal muscle tone, normal strength NEURO: Alert and oriented x 3, no focal neurologic deficits   IMPRESSION and PLAN:    1) Iron Deficiency Anemia 2) Hematochezia 3) Internal hemorrhoids -Change iron to QOD for reduced GI side effect profile -Recommended hemorrhoid band ligation.  She would like to read more about this.  Provided with a  handout today -If pursuing hemorrhoid banding, if possible would benefit from Plavix hold x5 days -Repeat CBC and iron panel next month -If still iron deficient, can again discuss hemorrhoid banding versus utility of VCE to rule out additional small bowel pathology  4) Hiatal hernia 5) GERD -Relatively well controlled but still requires high-dose PPI and daily H2 RA.  Only one episode of breakthrough since last appointment.  Still not wanting to proceed with surgical correction of hernia and antireflux surgery.  -Resume medical management -Resume antireflux lifestyle/dietary modifications  6) Dual antiplatelet therapy 7) History of carotid endarterectomy 8) Peripheral vascular disease -ASA 81 mg previously stopped.  Now on Plavix monotherapy  9) History of colon polyps: -Colonoscopy in 08/2019 with 2 sessile serrated polyps -Repeat colonoscopy in 2025 for ongoing polyp surveillance  I spent 35 minutes of time, including in depth chart review, independent review of results as outlined above, communicating results with the patient directly, face-to-face time with the patient, coordinating care, and ordering studies and medications as appropriate, and documentation.            Lavena Bullion ,DO, FACG 10/24/2019, 9:21 AM

## 2019-11-10 ENCOUNTER — Encounter: Payer: Self-pay | Admitting: Family Medicine

## 2019-11-11 ENCOUNTER — Other Ambulatory Visit: Payer: Self-pay | Admitting: Family Medicine

## 2019-11-20 DIAGNOSIS — Z23 Encounter for immunization: Secondary | ICD-10-CM | POA: Diagnosis not present

## 2019-11-25 ENCOUNTER — Other Ambulatory Visit (INDEPENDENT_AMBULATORY_CARE_PROVIDER_SITE_OTHER): Payer: PPO

## 2019-11-25 DIAGNOSIS — K219 Gastro-esophageal reflux disease without esophagitis: Secondary | ICD-10-CM | POA: Diagnosis not present

## 2019-11-25 DIAGNOSIS — D509 Iron deficiency anemia, unspecified: Secondary | ICD-10-CM

## 2019-11-25 DIAGNOSIS — K921 Melena: Secondary | ICD-10-CM | POA: Diagnosis not present

## 2019-11-25 LAB — CBC WITH DIFFERENTIAL/PLATELET
Basophils Absolute: 0 10*3/uL (ref 0.0–0.1)
Basophils Relative: 0.7 % (ref 0.0–3.0)
Eosinophils Absolute: 0.3 10*3/uL (ref 0.0–0.7)
Eosinophils Relative: 4.4 % (ref 0.0–5.0)
HCT: 41 % (ref 36.0–46.0)
Hemoglobin: 14.1 g/dL (ref 12.0–15.0)
Lymphocytes Relative: 47.5 % — ABNORMAL HIGH (ref 12.0–46.0)
Lymphs Abs: 3.1 10*3/uL (ref 0.7–4.0)
MCHC: 34.5 g/dL (ref 30.0–36.0)
MCV: 89.3 fl (ref 78.0–100.0)
Monocytes Absolute: 0.6 10*3/uL (ref 0.1–1.0)
Monocytes Relative: 9.7 % (ref 3.0–12.0)
Neutro Abs: 2.5 10*3/uL (ref 1.4–7.7)
Neutrophils Relative %: 37.7 % — ABNORMAL LOW (ref 43.0–77.0)
Platelets: 266 10*3/uL (ref 150.0–400.0)
RBC: 4.59 Mil/uL (ref 3.87–5.11)
RDW: 15.5 % (ref 11.5–15.5)
WBC: 6.5 10*3/uL (ref 4.0–10.5)

## 2019-11-25 LAB — IBC + FERRITIN
Ferritin: 46.9 ng/mL (ref 10.0–291.0)
Iron: 83 ug/dL (ref 42–145)
Saturation Ratios: 21.6 % (ref 20.0–50.0)
Transferrin: 275 mg/dL (ref 212.0–360.0)

## 2019-12-02 ENCOUNTER — Telehealth: Payer: Self-pay | Admitting: Gastroenterology

## 2019-12-02 NOTE — Telephone Encounter (Signed)
While there can be transient changes in the differential on a CBC related to either acute illness (ie, infection) or possibly related to vaccines, an important part of the differential is the absolute value of the lymphocytes and neutrophils, which are both normal. The % is not as important. I will typically just repeat CBC with diff in 1-2 months. Otherwise, no, this is not a pattern that is alarming for things like leukemia.

## 2019-12-02 NOTE — Telephone Encounter (Signed)
Patient is calling wanting to go over labs with you

## 2019-12-02 NOTE — Telephone Encounter (Signed)
Neutrophils Relative % 43.0 - 77.0 % 37.7Low   50.5  44.4   Lymphocytes Relative 12.0 - 46.0 % 47.5High   37.6  43.5    Called and spoke with patient- patient reports she had the 2nd COVID vaccine 3 days prior to this lab work; she is wanting to know if these are abnormal because of that or are they abnormal because she is "sick like leukemia"; patient reports a family hx of critical illnesses and she is worried about these results  Please advise

## 2019-12-03 NOTE — Telephone Encounter (Signed)
Called and spoke with patient-patient informed of MD recommendations; patient is agreeable with plan of care and patient verbalized understanding of information/instructions;  Patient was advised to call the office at [phone] if questions/concerns arise;

## 2020-01-21 ENCOUNTER — Telehealth: Payer: Self-pay | Admitting: *Deleted

## 2020-01-21 NOTE — Telephone Encounter (Signed)
   [facility] Pre-operative Risk Assessment    Request for surgical clearance:  1. What type of surgery is being performed? 19 TEETH TO BE EXTRACTED WITH A DENTURE PLATE TO BE PLACED   2. When is this surgery scheduled? TBD   3. What type of clearance is required (medical clearance vs. Pharmacy clearance to hold med vs. Both)? MEDICAL  4. Are there any medications that need to be held prior to surgery and how long? PLAVIX   5. Practice name and name of physician performing surgery? [facility]; TIMOTHY BARBER, DMD  6. What is your office phone number [phone]    7.   What is your office fax number [phone]  8.   Anesthesia type (None, local, MAC, general) ? LOCAL   Julaine Hua 01/21/2020, 9:07 AM  _________________________________________________________________   (provider comments below)

## 2020-01-22 NOTE — Telephone Encounter (Signed)
   Primary Cardiologist: Larae Grooms, MD  Chart reviewed and patient contacted by phone today as part of pre-operative protocol coverage. Given past medical history and time since last visit, based on ACC/AHA guidelines, [name] would be at acceptable risk for the planned procedure without further cardiovascular testing.   OK to hold Plavix 3-5 days pre op (pt is aware).   I will route this recommendation to the requesting party via Epic fax function and remove from pre-op pool.  Please call with questions.  Kerin Ransom, PA-C 01/22/2020, 10:42 AM

## 2020-01-24 IMAGING — RF ESOPHAGUS/BARIUM SWALLOW/TABLET STUDY
14 of 24 series · 14 of 24 positions shown · non-contrast
Comparison: None.

CLINICAL DATA: 67-year-old female with history of epigastric
discomfort. Ulcerated hiatal hernia. Reflux.

EXAM:
ESOPHOGRAM / BARIUM SWALLOW / BARIUM TABLET STUDY
TECHNIQUE: Combined double contrast and single contrast examination performed
using effervescent crystals, thick barium liquid, and thin barium
liquid. The patient was observed with fluoroscopy swallowing a 13 mm
barium sulphate tablet.
FLUOROSCOPY TIME:  Fluoroscopy Time:  2 minutes and 1 second
Radiation Exposure Index (if provided by the fluoroscopic device):
9.33 mGy

[Series 1: run · 1 of 1 slices shown (1 of 14)]
[im 1/1]
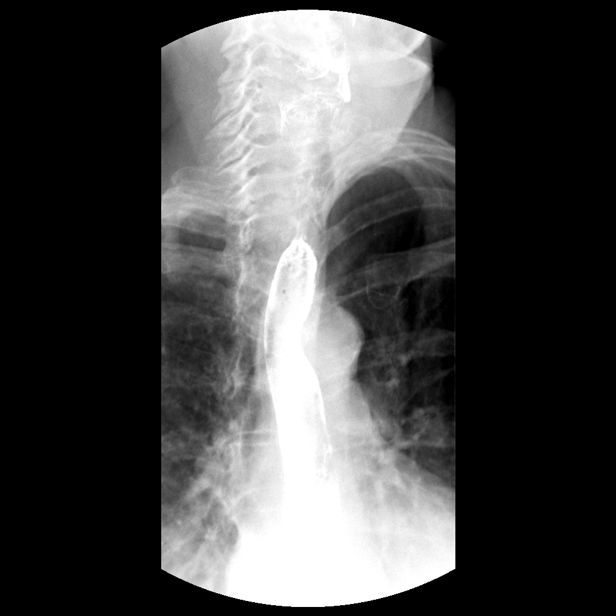

[Series 3: run · 1 of 1 slices shown (2 of 14)]
[im 1/1]
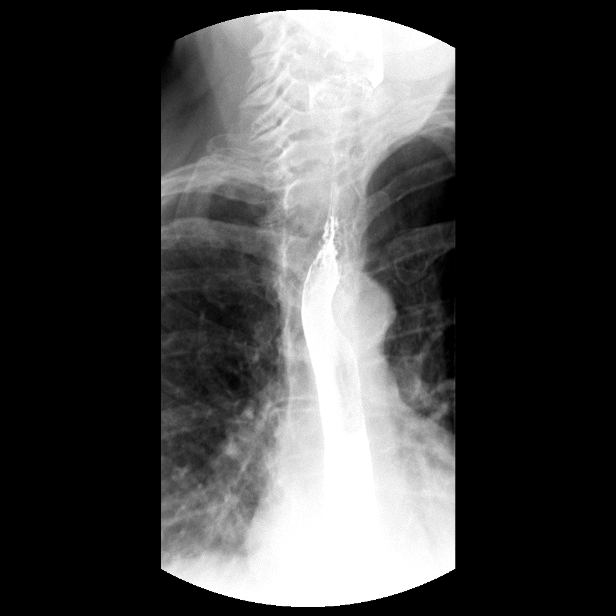

[Series 5: run · 1 of 1 slices shown (3 of 14)]
[im 1/1]
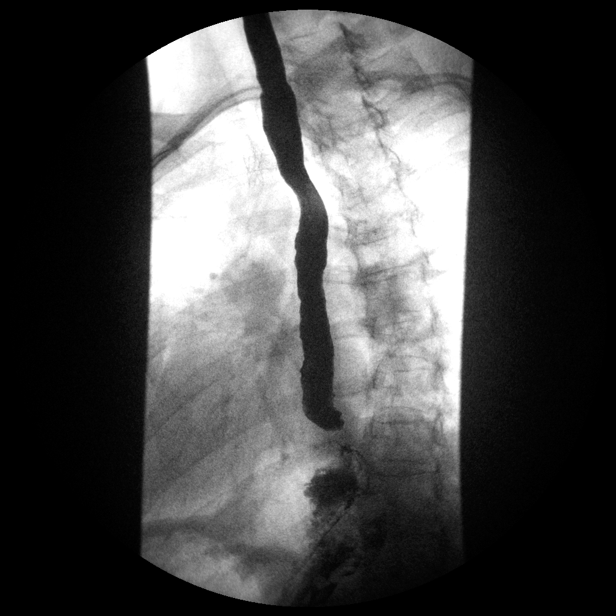

[Series 7: run · 1 of 1 slices shown (4 of 14)]
[im 1/1]
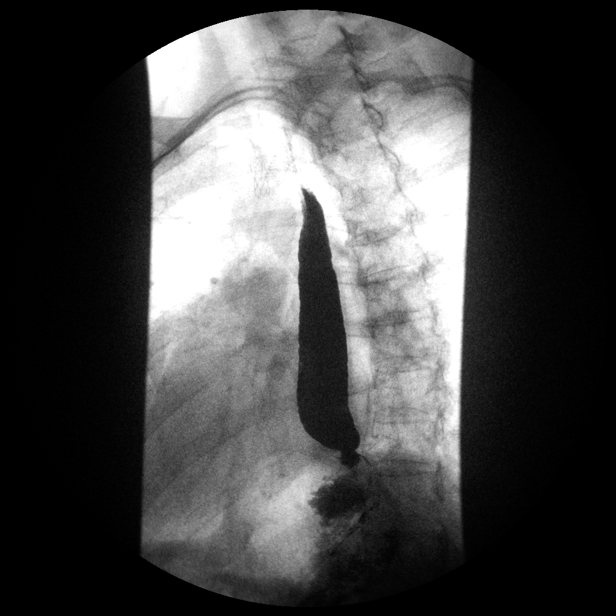

[Series 8: run · 1 of 1 slices shown (5 of 14)]
[im 1/1]
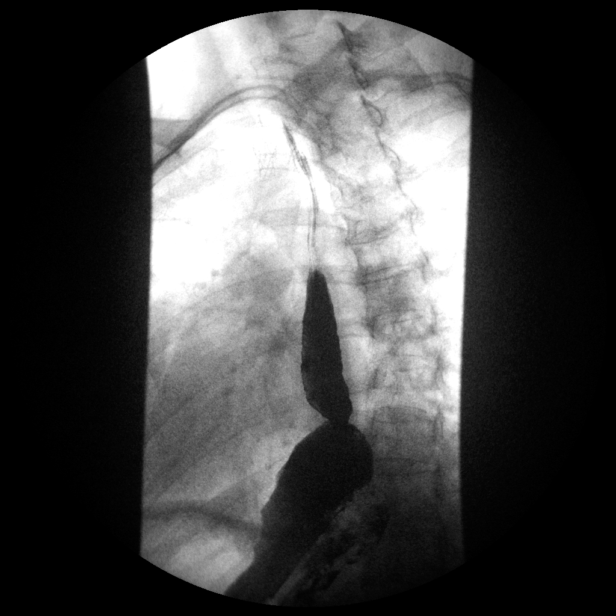

[Series 10: run · 1 of 1 slices shown (6 of 14)]
[im 1/1]
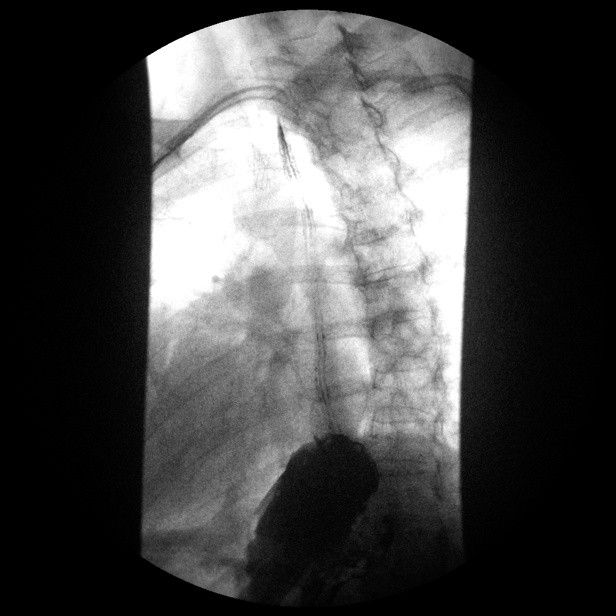

[Series 12: run · 1 of 1 slices shown (7 of 14)]
[im 1/1]
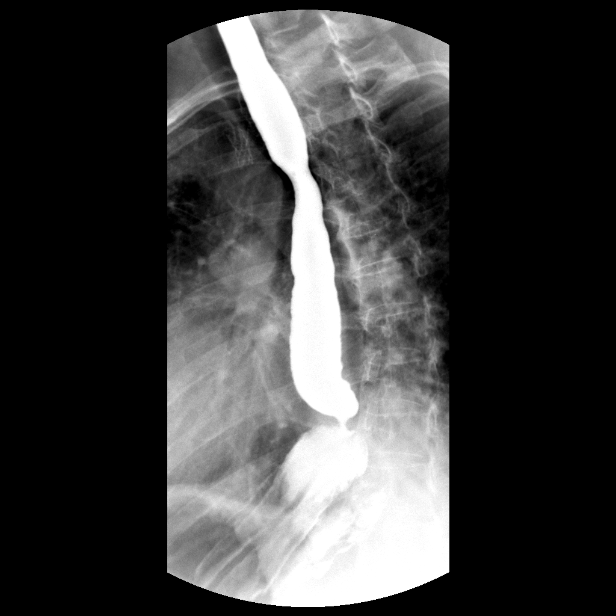

[Series 13: run · 1 of 1 slices shown (8 of 14)]
[im 1/1]
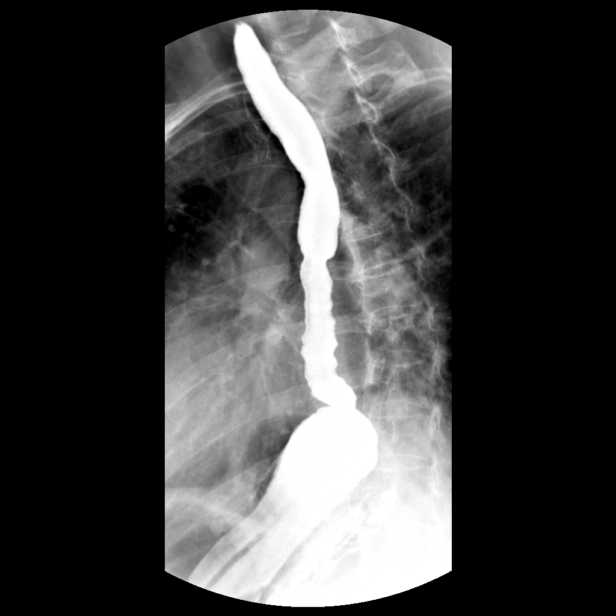

[Series 15: run · 1 of 1 slices shown (9 of 14)]
[im 1/1]
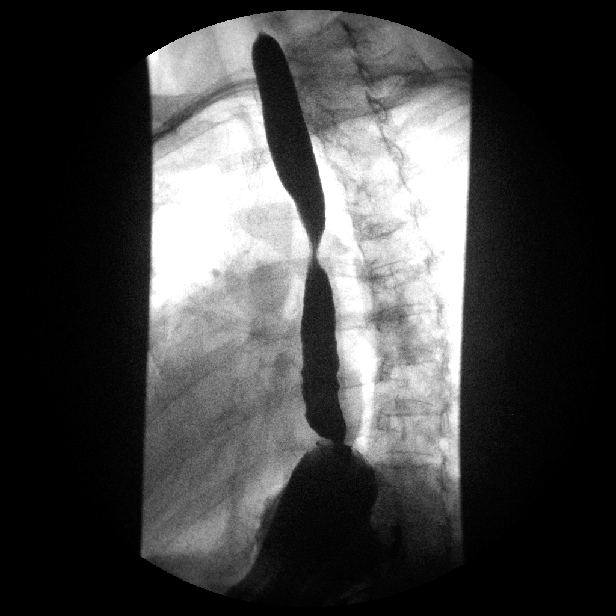

[Series 17: run · 1 of 1 slices shown (10 of 14)]
[im 1/1]
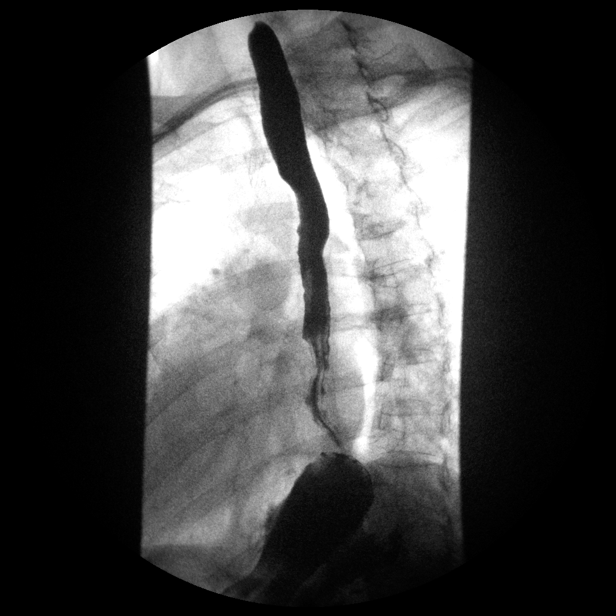

[Series 19: run · 1 of 1 slices shown (11 of 14)]
[im 1/1]
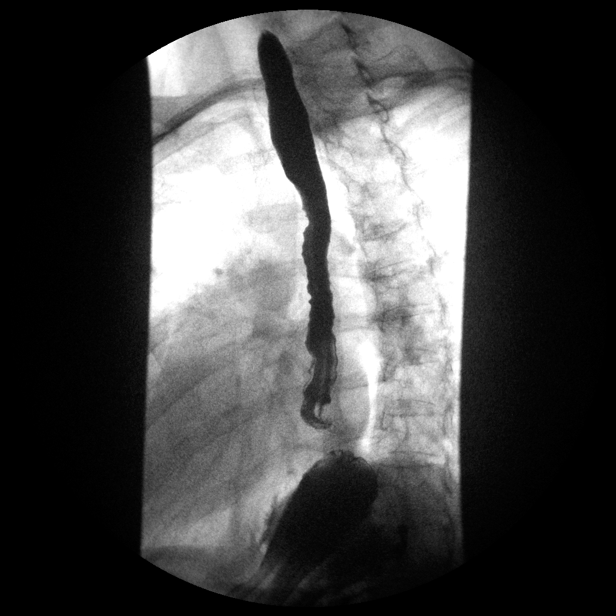

[Series 20: run · 1 of 1 slices shown (12 of 14)]
[im 1/1]
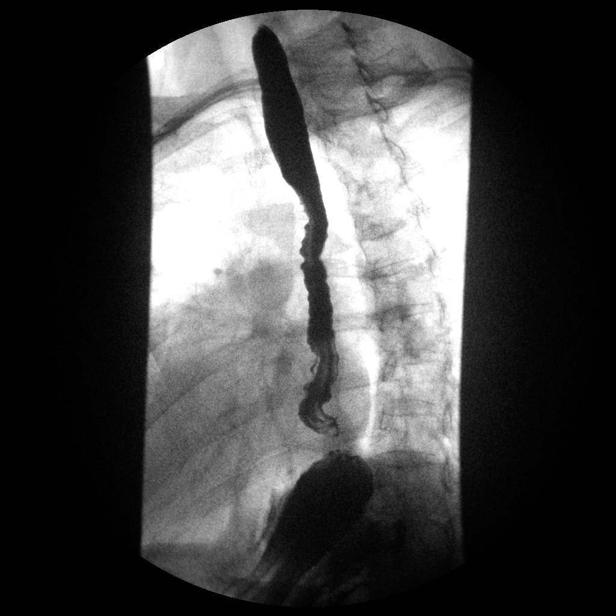

[Series 22: run · 1 of 1 slices shown (13 of 14)]
[im 1/1]
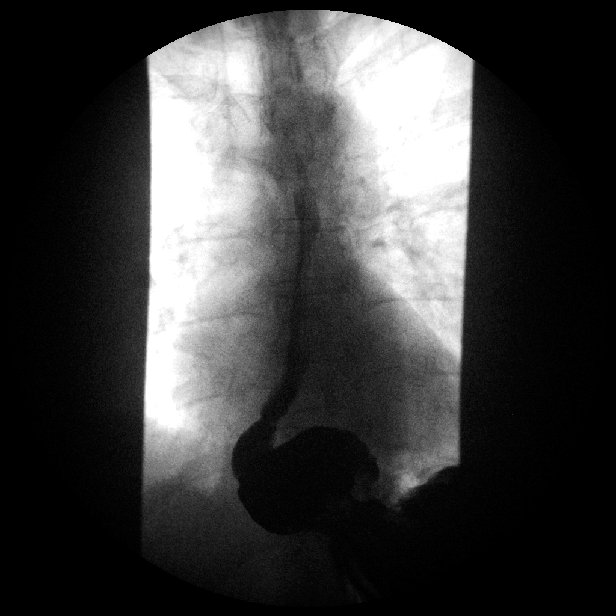

[Series 24: run · 1 of 1 slices shown (14 of 14)]
[im 1/1]
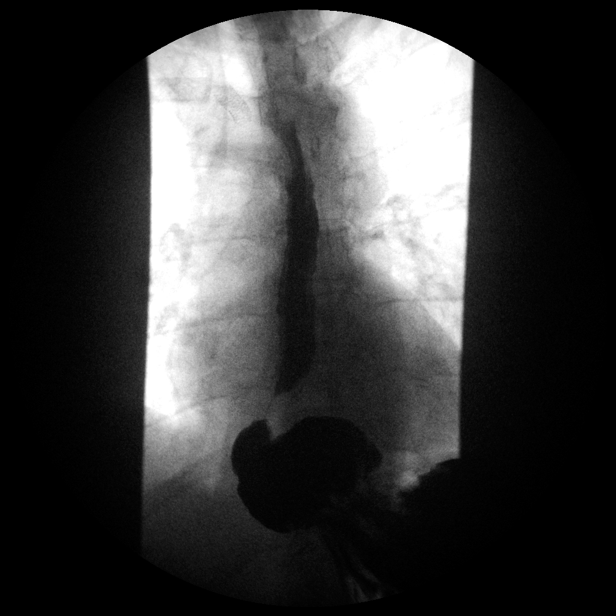

[14 of 24 positions shown; findings below may reference images not displayed]

FINDINGS: Initial double contrast images of the esophagus demonstrate a normal
appearance of the esophageal mucosa. No esophageal mass, stricture
or esophageal ring. Multiple single swallow attempts demonstrated
generally normal propagation of primary peristaltic waves, however,
occasional non propagation was identified, and intermittent tertiary
contractions were also noted. Full column esophagram demonstrated a
moderate-sized hiatal hernia. Water siphon test demonstrated
extensive gastroesophageal reflux. A barium tablet was administered
which passed readily into the stomach.
IMPRESSION: 1. Mild nonspecific esophageal motility disorder with occasional
tertiary contractions.
2. Moderate-sized hiatal hernia with extensive gastroesophageal
reflux.

## 2020-01-29 ENCOUNTER — Ambulatory Visit (INDEPENDENT_AMBULATORY_CARE_PROVIDER_SITE_OTHER): Payer: PPO | Admitting: Family Medicine

## 2020-01-29 ENCOUNTER — Other Ambulatory Visit: Payer: Self-pay

## 2020-01-29 ENCOUNTER — Encounter: Payer: Self-pay | Admitting: Family Medicine

## 2020-01-29 VITALS — BP 122/78 | HR 78 | Temp 98.4°F | Resp 16 | Ht 64.0 in | Wt 144.4 lb

## 2020-01-29 DIAGNOSIS — I10 Essential (primary) hypertension: Secondary | ICD-10-CM | POA: Diagnosis not present

## 2020-01-29 DIAGNOSIS — Z862 Personal history of diseases of the blood and blood-forming organs and certain disorders involving the immune mechanism: Secondary | ICD-10-CM | POA: Diagnosis not present

## 2020-01-29 DIAGNOSIS — E785 Hyperlipidemia, unspecified: Secondary | ICD-10-CM

## 2020-01-29 DIAGNOSIS — F411 Generalized anxiety disorder: Secondary | ICD-10-CM | POA: Diagnosis not present

## 2020-01-29 DIAGNOSIS — Z79899 Other long term (current) drug therapy: Secondary | ICD-10-CM | POA: Diagnosis not present

## 2020-01-29 DIAGNOSIS — R7303 Prediabetes: Secondary | ICD-10-CM

## 2020-01-29 LAB — COMPREHENSIVE METABOLIC PANEL
ALT: 25 U/L (ref 0–35)
AST: 20 U/L (ref 0–37)
Albumin: 4.3 g/dL (ref 3.5–5.2)
Alkaline Phosphatase: 100 U/L (ref 39–117)
BUN: 17 mg/dL (ref 6–23)
CO2: 27 mEq/L (ref 19–32)
Calcium: 9.7 mg/dL (ref 8.4–10.5)
Chloride: 104 mEq/L (ref 96–112)
Creatinine, Ser: 0.72 mg/dL (ref 0.40–1.20)
GFR: 80.38 mL/min (ref >60.00)
Glucose, Bld: 98 mg/dL (ref 70–99)
Potassium: 4.7 mEq/L (ref 3.5–5.1)
Sodium: 138 mEq/L (ref 135–145)
Total Bilirubin: 1.9 mg/dL — ABNORMAL HIGH (ref 0.2–1.2)
Total Protein: 6.8 g/dL (ref 6.0–8.3)

## 2020-01-29 LAB — LIPID PANEL
Cholesterol: 130 mg/dL (ref 0–200)
HDL: 39.7 mg/dL (ref >39.00)
LDL Cholesterol: 50 mg/dL (ref 0–99)
NonHDL: 90.01
Total CHOL/HDL Ratio: 3
Triglycerides: 200 mg/dL — ABNORMAL HIGH (ref 0.0–149.0)
VLDL: 40 mg/dL (ref 0.0–40.0)

## 2020-01-29 LAB — CBC
HCT: 42.6 % (ref 36.0–46.0)
Hemoglobin: 14.4 g/dL (ref 12.0–15.0)
MCHC: 33.9 g/dL (ref 30.0–36.0)
MCV: 95.4 fl (ref 78.0–100.0)
Platelets: 276 10*3/uL (ref 150.0–400.0)
RBC: 4.46 Mil/uL (ref 3.87–5.11)
RDW: 13.8 % (ref 11.5–15.5)
WBC: 7.8 10*3/uL (ref 4.0–10.5)

## 2020-01-29 LAB — HEMOGLOBIN A1C: Hgb A1c MFr Bld: 5.7 % (ref 4.6–6.5)

## 2020-01-29 NOTE — Progress Notes (Signed)
OFFICE VISIT  01/29/2020   CC:  Chief Complaint  Patient presents with  . Follow-up    RCI, pt is fasting   HPI:    Patient is a 69 y.o. Caucasian female who presents for 6 mo f/u HTN, HLD,prediabetes, and anxiety. She has PAD (inominate, carotid)->she is maintained on plavix and statin.     INTERIM HX: Hx of IDA, upper and lower end unrevealing except she may possibly get int hemorr banding. She was changed to QOD iron when last saw Dr. Bryan Lemma 10/24/19. She actually felt worse off this so she restarted it.  She had oral surg (bone resection, all teeth extracted including wisdom teeth).  Still in lots of pain, taking tylenol. Unable to tolerate opioids. She was put on 5d of azithromycin preventatively by her dentist.  Anxiety: taking diazepam long term and has been sufficiently helpful w/out probs. PMP AWARE reviewed today: most recent rx for diazepam 2mg  was filled 01/06/20, # 52, rx by me. No red flags.  Diet improving: no red meat, minimizing complex carbs and fried foods, increasing fresh veg. She joined silver sneakers, does yard work and walks regularly. No probs with statin. Occ home bp checks consistently normal.  No longer works at [employer].  ROS: no fevers, no CP, no SOB, no wheezing, no cough, no dizziness, no HAs, no rashes, no melena/hematochezia.  No polyuria or polydipsia.  No myalgias or arthralgias.  No focal weakness, paresthesias, or tremors.  No acute vision or hearing abnormalities. No n/v/d or abd pain.  No palpitations.     Past Medical History:  Diagnosis Date  . Abdominal bruit 04/2017   Aortic u/s showed NO ANEURISM.  Marland Kitchen Allergy    SEASONAL  . Anxiety   . BCC (basal cell carcinoma), arm, right   . Carotid artery occlusion   . Cataract   . Colon cancer screening 05/2017   Colonoscopy reportedly normal approx 2008.  Cologuard NEG--repeat 3 yrs.  Marland Kitchen COPD (chronic obstructive pulmonary disease) ([place])    DOE, worse since 11/2018.   Stress test neg 01/2019.  Albut x 1 inh prn helpful some.  Pulm 06/2019, PFTs not diag of COPD, DOE likely from deconditioning.  . Essential hypertension, benign 05/22/2014  . GERD (gastroesophageal reflux disease)   . H/O hiatal hernia 02/2019   EGD-->moderate size->surgery recommended but pt declined.  . Hematochezia 2020/21   internal hemorrhoids  . Iron deficiency anemia 07/2019   Dr. Bryan Lemma EGD and colonoscopy, some anorectal muscosal ulceration, o/w just mild chronic gastritis.   Also could be poor absorption due to chronic high dose acid suppression  . Melanoma ([place])   . Mitral valve prolapse   . Mixed hyperlipidemia 05/22/2014  . Osteopenia 06/06/2016   05/2016 DEXA T-score -1.5.  06/2018 DEXA T score -1.1  . Peripheral vascular disease, unspecified ([place]) 05/22/2014   innominate and carotid.  U/S f/u 12/2016 showed no signif change compared to 2017--vascular recommended repeat 1 yr.  Doing well as of 08/2018 cardiol f/u-->continued on ASA and Plavix. F/u 08/2019->ASA d/c'd, plavix continued.  . Prediabetes 2018   2018 A1c 6.3%.  05/2018 A1c 6.1%. 07/2019 A1c 6.4%.  Marland Kitchen Reflux esophagitis   . Stroke [facility]) 2006   Mini  Stroke- not major  . Tobacco abuse, in remission    quit 2009    Past Surgical History:  Procedure Laterality Date  . ABDOMINAL HYSTERECTOMY  1997  . CARDIAC CATHETERIZATION  01/2012   NORMAL  . CARDIOVASCULAR STRESS TEST  02/05/2019  NORMAL.  EF normal.  . CAROTID ENDARTERECTOMY  08/21/2007   right - Dr. Amedeo Plenty  . CHOLECYSTECTOMY  2000  . COLONOSCOPY  approx 2008; 09/23/19   2008 (Dr. Lenon Ahmadi pt->normal.  08/2019 adenoma, some friable areas of colon + anorectal mucosa inflam w/ ulceration, +diverticulosis. Recall 5 yrs  . DEXA  06/06/2016   05/2016 T-score -1.5.  06/2018 T score -1.1.  Repeat 2 yrs.  . ESOPHAGOGASTRODUODENOSCOPY  03/11/2019; 09/23/19   02/2019; 5 cm hiatal hernia; GI suggested surgical repair due to intractable GER.  08/2019 mild chronic  gastritis, h pylori neg, multiple benign bx's including duodenum neg for celiac changes.  . Incision and drainage of left thenar space abscess  02/23/15   s/p cat bite (Dr. Amedeo Plenty)  . innominate stent  2007   12/2014 u/s showed patent stent in innominate and L common carotid.  L prox int carotid 40-59% stenosis  . LDCT for lung ca screening  06/2019   NEG->rpt 1 yr  . Left common carotid stent  2006 and 2007   "                    "                  "                          "                             "  . TRANSTHORACIC ECHOCARDIOGRAM  2007   NORMAL  . US CAROTID DOPPLER BILATERAL ([place] [facility])  06/2019   left stent w/out stenosis.  R ICA sten 40-50% obst-stable. Rpt 1 yr.    Outpatient Medications Prior to Visit  Medication Sig Dispense Refill  . atorvastatin (LIPITOR) 80 MG tablet Take 1 tablet (80 mg total) by mouth daily. 90 tablet 3  . azithromycin (ZITHROMAX) 250 MG tablet TAKE 2 TABLETS BY MOUTH ON DAY 1, THEN TAKE 1 TABLET DAILY ON DAYS 2-5    . cholecalciferol (VITAMIN D) 1000 UNITS tablet Take 1,000 Units by mouth daily.    . clopidogrel (PLAVIX) 75 MG tablet Take 1 tablet (75 mg total) by mouth daily. 90 tablet 3  . diazepam (VALIUM) 2 MG tablet TAKE ONE TABLET BY MOUTH TWICE DAILY 60 tablet 5  . famotidine (PEPCID) 40 MG tablet TAKE ONE TABLET BY MOUTH DAILY WITH LUNCH 30 tablet 2  . ferrous sulfate 325 (65 FE) MG tablet Take 325 mg by mouth daily.     . fexofenadine (ALLEGRA) 180 MG tablet Take 180 mg by mouth daily. Take 1 tablet daily for allergies    . lisinopril (ZESTRIL) 5 MG tablet TAKE ONE TABLET BY MOUTH DAILY 90 tablet 3  . Magnesium 500 MG CAPS Take by mouth.    . Omega-3 Fatty Acids (FISH OIL) 1000 MG CAPS Take 2,000 mg by mouth daily.     . pantoprazole (PROTONIX) 40 MG tablet Take 1 tablet (40 mg total) by mouth 2 (two) times daily. 180 tablet 3  . tiotropium (SPIRIVA HANDIHALER) 18 MCG inhalation capsule Place 1 capsule (18 mcg total) into inhaler and inhale  daily. (Patient not taking: Reported on 01/29/2020) 30 capsule 11   No facility-administered medications prior to visit.    Allergies  Allergen Reactions  . Doxycycline Shortness Of Breath and Swelling    Lips  swelling and short of breath  . Fentanyl Nausea And Vomiting    Pt states all pain medications cause N/V for her Pt states all pain medications cause N/V for her  . Fluocinolone Nausea And Vomiting  . Meperidine Nausea And Vomiting  . Penicillins Hives  . Breo Ellipta [Fluticasone Furoate-Vilanterol] Other (See Comments)    Lower extremity weakness, lightheadedness     ROS As per HPI  PE: Vitals with BMI 01/29/2020 10/24/2019 09/26/2019  Height 5\' 4"  5\' 4"  -  Weight 144 lbs 6 oz 152 lbs 4 oz 145 lbs  BMI 24.77 [id number]  Systolic [id number]  Diastolic 78 84 74  Pulse 78 91 -    Gen: Alert, well appearing.  Patient is oriented to person, place, time, and situation. AFFECT: pleasant, lucid thought and speech. CV: RRR, no m/r/g.   LUNGS: CTA bilat, nonlabored resps, good aeration in all lung fields. EXT: no clubbing or cyanosis.  no edema.    LABS:  Lab Results  Component Value Date   TSH 1.10 08/01/2019   Lab Results  Component Value Date   WBC 6.5 11/25/2019   HGB 14.1 11/25/2019   HCT 41.0 11/25/2019   MCV 89.3 11/25/2019   PLT 266.0 11/25/2019   Lab Results  Component Value Date   IRON 83 11/25/2019   FERRITIN 46.9 11/25/2019    Lab Results  Component Value Date   CREATININE 0.74 08/01/2019   BUN 15 08/01/2019   NA 139 08/01/2019   K 4.9 08/01/2019   CL 105 08/01/2019   CO2 26 08/01/2019   Lab Results  Component Value Date   ALT 22 08/01/2019   AST 20 08/01/2019   ALKPHOS 113 08/01/2019   BILITOT 1.3 (H) 08/01/2019   Lab Results  Component Value Date   CHOL 153 08/01/2019   Lab Results  Component Value Date   HDL 44.60 08/01/2019   Lab Results  Component Value Date   LDLCALC 74 08/01/2019   Lab Results  Component Value Date    TRIG 175.0 (H) 08/01/2019   Lab Results  Component Value Date   CHOLHDL 3 08/01/2019   Lab Results  Component Value Date   HGBA1C 6.4 08/01/2019   IMPRESSION AND PLAN:  1) HTN: The current medical regimen is effective;  continue present plan and medications. Lytes/cr  Today.  2) HLD: tolerating statin.  Diet improving significantly. Goal LDL <70. LDL 74, hepatic panel normal 07/2019. FLP and hepatic panel today.  3) Prediabetes: making good dietary changes and activity/exercise increasing. HbA1c today, fasting glucose today.  4) Hx of IDA, no source identified on EGD/colonoscopy. Internal hemorrhoids->she is not going to pursue banding at this time. No recent BRBPR, no melena. Taking iron qd still b/c feels better on this med. Last CBC and iron level 11/25/19 NORMAL. Plan on keeping pantoprazole bid at this time (hx of signif HH + chronic GERD). CBC monitoring today.  5) GAD: doing well long term on diazepam. Has always used this med responsibly. Renewed CSC today.  An After Visit Summary was printed and given to the patient.  FOLLOW UP: Return in about 6 months (around 07/31/2020) for annual CPE (fasting).  Signed:  Crissie Sickles, MD           01/29/2020

## 2020-01-30 ENCOUNTER — Other Ambulatory Visit: Payer: PPO

## 2020-01-31 ENCOUNTER — Other Ambulatory Visit: Payer: Self-pay | Admitting: Family Medicine

## 2020-01-31 LAB — HEPATITIS B SURFACE ANTIGEN: Hepatitis B Surface Ag: NONREACTIVE

## 2020-01-31 LAB — HEPATITIS C ANTIBODY
Hepatitis C Ab: NONREACTIVE
SIGNAL TO CUT-OFF: 0.01 (ref <1.00)

## 2020-01-31 NOTE — Telephone Encounter (Signed)
RF request for Diazepam. Last RX 08/19/20 Last OV 01/29/20. Next OV 07/31/20  Refill appropriate?

## 2020-03-18 NOTE — Progress Notes (Signed)
Cardiology Office Note   Date:  03/20/2020   ID:  [name], DOB 10/10/1950, MRN [id number]  PCP:  Tammi Sou, MD    No chief complaint on file.  PAD  Wt Readings from Last 3 Encounters:  03/20/20 140 lb 6.4 oz (63.7 kg)  01/29/20 144 lb 6.4 oz (65.5 kg)  10/24/19 152 lb 4 oz (69.1 kg)       History of Present Illness: [name] is a 69 y.o. female   with a family history of significant coronary artery disease. She has a history of early vascular disease and difficult to control hyperlipidemia. She has had significant carotid disease in the past with stents placed in therightinnominate artery 2007)and leftcommon carotid artery (2006).Right CEA in 2008.  She had a cardiac cath in 2013 showing no significant coronary artery disease and only minimal coronary calcification.   Her husband passed away in 23-Sep-2015. He was at work in [place] when he died from a viral illness. It was very quick.  Occasional BP spikes at the MDs office.   She works across the street from the [employer]. She worked during the womens and mens [place] in 2020.  She had a viral infection in 11/2018. She had a chest xray. She was treated with antibiotics. Since then, she has had more DOE. SHe has not been able to get back to her usual self. She gets some DOE with mowing the lawn.  Stress test in 01/2019:  Nuclear stress EF: 56%.  The left ventricular ejection fraction is normal (55-65%).  No T wave inversion was noted during stress.  There was no ST segment deviation noted during stress.  The study is normal.  This is a low risk study.  Normal perfusion. LVEF 56% with normal wall motion. This is a low risk study.  She is getting back to walking.   Since the last visit, she retired. Her iron was low.  She has EGD and colonoscopy are planned on 12/28, with enteroscopy.  No bleeding identified.  She still takes iron.   She has received  her COVID vaccines.   Denies : Chest pain. Dizziness. Leg edema. Nitroglycerin use. Orthopnea. Palpitations. Paroxysmal nocturnal dyspnea. Syncope.   Mild DOE- chronic.  Known mild emphysema.  Had dental work in 5/21.  She is trying to decreased processed food intake.        Past Medical History:  Diagnosis Date  . Abdominal bruit 04/2017   Aortic u/s showed NO ANEURISM.  Marland Kitchen Allergy    SEASONAL  . Anxiety   . BCC (basal cell carcinoma), arm, right   . Carotid artery occlusion   . Cataract   . Colon cancer screening 05/2017   Colonoscopy reportedly normal approx 2008.  Cologuard NEG--repeat 3 yrs.  Marland Kitchen COPD (chronic obstructive pulmonary disease) ([place])    DOE, worse since 11/2018.  Stress test neg 01/2019.  Albut x 1 inh prn helpful some.  Pulm 06/2019, PFTs not diag of COPD, DOE likely from deconditioning.  . Essential hypertension, benign 05/22/2014  . GERD (gastroesophageal reflux disease)   . H/O hiatal hernia 02/2019   EGD-->moderate size->surgery recommended but pt declined.  . Hematochezia 2020/21   internal hemorrhoids  . History of iron deficiency anemia 07/2019   Dr. Bryan Lemma EGD and colonoscopy, some anorectal muscosal ulceration, o/w just mild chronic gastritis.   Also could be poor absorption due to chronic high dose acid suppression. Hb/iron normalized aft iron infus  and ongoing oral iron.  . Melanoma ([place])   . Mitral valve prolapse   . Mixed hyperlipidemia 05/22/2014  . Osteopenia 06/06/2016   05/2016 DEXA T-score -1.5.  06/2018 DEXA T score -1.1  . Peripheral vascular disease, unspecified ([place]) 05/22/2014   innominate and carotid.  U/S f/u 12/2016 showed no signif change compared to 2017--vascular recommended repeat 1 yr.  Doing well as of 08/2018 cardiol f/u-->continued on ASA and Plavix. F/u 08/2019->ASA d/c'd, plavix continued.  . Prediabetes 2018   2018 A1c 6.3%.  05/2018 A1c 6.1%. 07/2019 A1c 6.4%.  Marland Kitchen Reflux esophagitis   . Stroke [facility]) 2006   Mini   Stroke- not major  . Tobacco abuse, in remission    quit 2009    Past Surgical History:  Procedure Laterality Date  . ABDOMINAL HYSTERECTOMY  1997  . CARDIAC CATHETERIZATION  01/2012   NORMAL  . CARDIOVASCULAR STRESS TEST  02/05/2019   NORMAL.  EF normal.  . CAROTID ENDARTERECTOMY  08/21/2007   right - Dr. Amedeo Plenty  . CHOLECYSTECTOMY  2000  . COLONOSCOPY  approx 2008; 09/23/19   2008 (Dr. Lenon Ahmadi pt->normal.  08/2019 adenoma, some friable areas of colon + anorectal mucosa inflam w/ ulceration, +diverticulosis. Recall 5 yrs  . DEXA  06/06/2016   05/2016 T-score -1.5.  06/2018 T score -1.1.  Repeat 2 yrs.  . ESOPHAGOGASTRODUODENOSCOPY  03/11/2019; 09/23/19   02/2019; 5 cm hiatal hernia; GI suggested surgical repair due to intractable GER.  08/2019 mild chronic gastritis, h pylori neg, multiple benign bx's including duodenum neg for celiac changes.  . Incision and drainage of left thenar space abscess  02/23/15   s/p cat bite (Dr. Amedeo Plenty)  . innominate stent  2007   12/2014 u/s showed patent stent in innominate and L common carotid.  L prox int carotid 40-59% stenosis  . LDCT for lung ca screening  06/2019   NEG->rpt 1 yr  . Left common carotid stent  2006 and 2007   "                    "                  "                          "                             "  . TRANSTHORACIC ECHOCARDIOGRAM  2007   NORMAL  . US CAROTID DOPPLER BILATERAL ([place] [facility])  06/2019   left stent w/out stenosis.  R ICA sten 40-50% obst-stable. Rpt 1 yr.     Current Outpatient Medications  Medication Sig Dispense Refill  . atorvastatin (LIPITOR) 80 MG tablet Take 1 tablet (80 mg total) by mouth daily. 90 tablet 3  . cholecalciferol (VITAMIN D) 1000 UNITS tablet Take 1,000 Units by mouth daily.    . clopidogrel (PLAVIX) 75 MG tablet Take 1 tablet (75 mg total) by mouth daily. 90 tablet 3  . diazepam (VALIUM) 2 MG tablet TAKE ONE TABLET BY MOUTH TWICE DAILY 60 tablet 5  . famotidine (PEPCID) 40 MG tablet TAKE  ONE TABLET BY MOUTH DAILY WITH LUNCH 30 tablet 2  . ferrous sulfate 325 (65 FE) MG tablet Take 325 mg by mouth daily.     . fexofenadine (ALLEGRA) 180 MG tablet Take 180 mg by mouth  daily. Take 1 tablet daily for allergies    . lisinopril (ZESTRIL) 5 MG tablet TAKE ONE TABLET BY MOUTH DAILY 90 tablet 3  . Magnesium 500 MG CAPS Take by mouth.    . Omega-3 Fatty Acids (FISH OIL) 1000 MG CAPS Take 2,000 mg by mouth daily.     . pantoprazole (PROTONIX) 40 MG tablet Take 1 tablet (40 mg total) by mouth 2 (two) times daily. 180 tablet 3  . tiotropium (SPIRIVA HANDIHALER) 18 MCG inhalation capsule Place 1 capsule (18 mcg total) into inhaler and inhale daily. 30 capsule 11  . azithromycin (ZITHROMAX) 250 MG tablet TAKE 2 TABLETS BY MOUTH ON DAY 1, THEN TAKE 1 TABLET DAILY ON DAYS 2-5     No current facility-administered medications for this visit.    Allergies:   Doxycycline, Fentanyl, Fluocinolone, Meperidine, Penicillins, and Breo ellipta [fluticasone furoate-vilanterol]    Social History:  The patient  reports that she quit smoking about 12 years ago. Her smoking use included cigarettes. She has never used smokeless tobacco. She reports that she does not drink alcohol and does not use drugs.   Family History:  The patient's family history includes CAD in her mother and paternal grandfather; Heart attack in her mother; Heart disease in her mother; Heart murmur in her sister; Hyperlipidemia in her father and mother; Hypertension in her father and mother; Osteoporosis in her paternal grandmother; Pneumonia in her father.    ROS:  Please see the history of present illness.   Otherwise, review of systems are positive for .   All other systems are reviewed and negative.    PHYSICAL EXAM: VS:  BP (!) 112/58   Pulse 94   Ht 5\' 4"  (1.626 m)   Wt 140 lb 6.4 oz (63.7 kg)   SpO2 95%   BMI 24.10 kg/m  , BMI Body mass index is 24.1 kg/m. GEN: Well nourished, well developed, in no acute distress    HEENT: normal  Neck: no JVD, carotid bruits, or masses Cardiac: RRR; no murmurs, rubs, or gallops,no edema  Respiratory:  clear to auscultation bilaterally, normal work of breathing GI: soft, nontender, nondistended, + BS MS: no deformity or atrophy ; 2+ radial pulses bilaterally Skin: warm and dry, no rash Neuro:  Strength and sensation are intact Psych: euthymic mood, full affect   EKG:   The ekg ordered 08/2019 demonstrates NSR, no ST changes   Recent Labs: 08/01/2019: TSH 1.10 01/29/2020: ALT 25; BUN 17; Creatinine, Ser 0.72; Hemoglobin 14.4; Platelets 276.0; Potassium 4.7; Sodium 138   Lipid Panel    Component Value Date/Time   CHOL 130 01/29/2020 0847   TRIG 200.0 (H) 01/29/2020 0847   HDL 39.70 01/29/2020 0847   CHOLHDL 3 01/29/2020 0847   VLDL 40.0 01/29/2020 0847   LDLCALC 50 01/29/2020 0847   LDLDIRECT 74.0 11/28/2017 0828     Other studies Reviewed: Additional studies/ records that were reviewed today with results demonstrating: labs reviewed.   ASSESSMENT AND PLAN:  1.   PAD: s/p subclavian stent.  Mild carotid disease.  LDL target < 100.  No arm claudication.  Coronary artery calcification noted on CT scan. 2.   Hyperlipidemia: The current medical regimen is effective;  continue present plan and medications.   3.   HTN: The current medical regimen is effective;  continue present plan and medicines. 4.   Aortic atherosclerosis: Continue lipid lowering therapy and whole food plant based diet.    Current medicines are reviewed at length  with the patient today.  The patient concerns regarding her medicines were addressed.  The following changes have been made:  No change  Labs/ tests ordered today include:  No orders of the defined types were placed in this encounter.   Recommend 150 minutes/week of aerobic exercise Low fat, low carb, high fiber diet recommended  Disposition:   FU in 9 months   Signed, Larae Grooms, MD  03/20/2020 11:23 AM     [facility] [address], [place], [place]  [address] Phone: [phone]; Fax: [phone]

## 2020-03-20 ENCOUNTER — Other Ambulatory Visit: Payer: Self-pay

## 2020-03-20 ENCOUNTER — Ambulatory Visit: Payer: PPO | Admitting: Interventional Cardiology

## 2020-03-20 ENCOUNTER — Encounter: Payer: Self-pay | Admitting: Interventional Cardiology

## 2020-03-20 VITALS — BP 112/58 | HR 94 | Ht 64.0 in | Wt 140.4 lb

## 2020-03-20 DIAGNOSIS — I10 Essential (primary) hypertension: Secondary | ICD-10-CM | POA: Diagnosis not present

## 2020-03-20 DIAGNOSIS — E782 Mixed hyperlipidemia: Secondary | ICD-10-CM | POA: Diagnosis not present

## 2020-03-20 DIAGNOSIS — I6523 Occlusion and stenosis of bilateral carotid arteries: Secondary | ICD-10-CM | POA: Diagnosis not present

## 2020-03-20 DIAGNOSIS — I700 Atherosclerosis of aorta: Secondary | ICD-10-CM | POA: Diagnosis not present

## 2020-03-20 DIAGNOSIS — I739 Peripheral vascular disease, unspecified: Secondary | ICD-10-CM | POA: Diagnosis not present

## 2020-03-20 NOTE — Patient Instructions (Signed)
Medication Instructions:  Your physician recommends that you continue on your current medications as directed. Please refer to the Current Medication list given to you today.  *If you need a refill on your cardiac medications before your next appointment, please call your pharmacy*   Lab Work: None  If you have labs (blood work) drawn today and your tests are completely normal, you will receive your results only by: . MyChart Message (if you have MyChart) OR . A paper copy in the mail If you have any lab test that is abnormal or we need to change your treatment, we will call you to review the results.   Testing/Procedures: None   Follow-Up: At [facility], you and your health needs are our priority.  As part of our continuing mission to provide you with exceptional heart care, we have created designated Provider Care Teams.  These Care Teams include your primary Cardiologist (physician) and Advanced Practice Providers (APPs -  Physician Assistants and Nurse Practitioners) who all work together to provide you with the care you need, when you need it.  We recommend signing up for the patient portal called "MyChart".  Sign up information is provided on this After Visit Summary.  MyChart is used to connect with patients for Virtual Visits (Telemedicine).  Patients are able to view lab/test results, encounter notes, upcoming appointments, etc.  Non-urgent messages can be sent to your provider as well.   To learn more about what you can do with MyChart, go to [email or web address].    Your next appointment:   9 month(s)  The format for your next appointment:   In Person  Provider:   You may see Jayadeep Varanasi, MD or one of the following Advanced Practice Providers on your designated Care Team:    Dayna Dunn, PA-C  Michele Lenze, PA-C    Other Instructions None  

## 2020-04-15 ENCOUNTER — Other Ambulatory Visit: Payer: Self-pay

## 2020-04-16 ENCOUNTER — Other Ambulatory Visit: Payer: Self-pay

## 2020-04-16 ENCOUNTER — Ambulatory Visit (INDEPENDENT_AMBULATORY_CARE_PROVIDER_SITE_OTHER): Payer: PPO | Admitting: Family Medicine

## 2020-04-16 ENCOUNTER — Encounter: Payer: Self-pay | Admitting: Family Medicine

## 2020-04-16 VITALS — BP 107/70 | HR 81 | Temp 97.8°F | Resp 16 | Ht 64.0 in | Wt 137.4 lb

## 2020-04-16 DIAGNOSIS — R300 Dysuria: Secondary | ICD-10-CM

## 2020-04-16 DIAGNOSIS — N3000 Acute cystitis without hematuria: Secondary | ICD-10-CM

## 2020-04-16 LAB — POC URINALSYSI DIPSTICK (AUTOMATED)
Bilirubin, UA: NEGATIVE
Blood, UA: NEGATIVE
Glucose, UA: NEGATIVE
Ketones, UA: NEGATIVE
Leukocytes, UA: NEGATIVE
Nitrite, UA: NEGATIVE
Protein, UA: NEGATIVE
Spec Grav, UA: 1.02 (ref 1.010–1.025)
Urobilinogen, UA: 0.2 E.U./dL
pH, UA: 7 (ref 5.0–8.0)

## 2020-04-16 MED ORDER — PANTOPRAZOLE SODIUM 40 MG PO TBEC: 40.0000 mg | DELAYED_RELEASE_TABLET | Freq: Every day | ORAL | 3 refills | Status: DC

## 2020-04-16 MED ORDER — SULFAMETHOXAZOLE-TRIMETHOPRIM 800-160 MG PO TABS: 1.0000 | ORAL_TABLET | Freq: Two times a day (BID) | ORAL | 0 refills | Status: AC

## 2020-04-16 MED ORDER — FLUTICASONE PROPIONATE 50 MCG/ACT NA SUSP
2.0000 | Freq: Every day | NASAL | 6 refills | Status: DC
Start: 2020-04-16 — End: 2021-04-14

## 2020-04-16 NOTE — Progress Notes (Signed)
OFFICE VISIT  04/16/2020   CC:  Chief Complaint  Patient presents with  . burning with urination    x 4 days, also having back and abdominal pain   HPI:    Patient is a 69 y.o. Caucasian female who presents for burning with urination. Onset 6d ago, achy in lower abd diffusely, burns in urethral region after urinating, strong odor to urine. Some mid to lower back pain lately, intermittent and mild.  No urgency or frequency or blood in urine. Took azo and it was helping but she stopped this.  Most recent azo was 2 d/a.  NO vag d/c or bleeding. Some nausea but no vomiting.  No diarrhea. Tm 99.7   Past Medical History:  Diagnosis Date  . Abdominal bruit 04/2017   Aortic u/s showed NO ANEURISM.  Marland Kitchen Allergy    SEASONAL  . Anxiety   . BCC (basal cell carcinoma), arm, right   . Carotid artery occlusion   . Cataract   . Colon cancer screening 05/2017   Colonoscopy reportedly normal approx 2008.  Cologuard NEG--repeat 3 yrs.  Marland Kitchen COPD (chronic obstructive pulmonary disease) ([place])    DOE, worse since 11/2018.  Stress test neg 01/2019.  Albut x 1 inh prn helpful some.  Pulm 06/2019, PFTs not diag of COPD, DOE likely from deconditioning.  . Essential hypertension, benign 05/22/2014  . GERD (gastroesophageal reflux disease)   . H/O hiatal hernia 02/2019   EGD-->moderate size->surgery recommended but pt declined.  . Hematochezia 2020/21   internal hemorrhoids  . History of iron deficiency anemia 07/2019   Dr. Bryan Lemma EGD and colonoscopy, some anorectal muscosal ulceration, o/w just mild chronic gastritis.   Also could be poor absorption due to chronic high dose acid suppression. Hb/iron normalized aft iron infus and ongoing oral iron.  . Melanoma ([place])   . Mitral valve prolapse   . Mixed hyperlipidemia 05/22/2014  . Osteopenia 06/06/2016   05/2016 DEXA T-score -1.5.  06/2018 DEXA T score -1.1  . Peripheral vascular disease, unspecified ([place]) 05/22/2014   innominate and carotid.  U/S  f/u 12/2016 showed no signif change compared to 2017--vascular recommended repeat 1 yr.  Doing well as of 08/2018 cardiol f/u-->continued on ASA and Plavix. F/u 08/2019->ASA d/c'd, plavix continued.  . Prediabetes 2018   2018 A1c 6.3%.  05/2018 A1c 6.1%. 07/2019 A1c 6.4%.  Marland Kitchen Reflux esophagitis   . Stroke [facility]) 2006   Mini  Stroke- not major  . Tobacco abuse, in remission    quit 2009    Past Surgical History:  Procedure Laterality Date  . ABDOMINAL HYSTERECTOMY  1997  . CARDIAC CATHETERIZATION  01/2012   NORMAL  . CARDIOVASCULAR STRESS TEST  02/05/2019   NORMAL.  EF normal.  . CAROTID ENDARTERECTOMY  08/21/2007   right - Dr. Amedeo Plenty  . CHOLECYSTECTOMY  2000  . COLONOSCOPY  approx 2008; 09/23/19   2008 (Dr. Lenon Ahmadi pt->normal.  08/2019 adenoma, some friable areas of colon + anorectal mucosa inflam w/ ulceration, +diverticulosis. Recall 5 yrs  . DEXA  06/06/2016   05/2016 T-score -1.5.  06/2018 T score -1.1.  Repeat 2 yrs.  . ESOPHAGOGASTRODUODENOSCOPY  03/11/2019; 09/23/19   02/2019; 5 cm hiatal hernia; GI suggested surgical repair due to intractable GER.  08/2019 mild chronic gastritis, h pylori neg, multiple benign bx's including duodenum neg for celiac changes.  . Incision and drainage of left thenar space abscess  02/23/15   s/p cat bite (Dr. Amedeo Plenty)  . innominate stent  2007  12/2014 u/s showed patent stent in innominate and L common carotid.  L prox int carotid 40-59% stenosis  . LDCT for lung ca screening  06/2019   NEG->rpt 1 yr  . Left common carotid stent  2006 and 2007   "                    "                  "                          "                             "  . TRANSTHORACIC ECHOCARDIOGRAM  2007   NORMAL  . US CAROTID DOPPLER BILATERAL ([place] [facility])  06/2019   left stent w/out stenosis.  R ICA sten 40-50% obst-stable. Rpt 1 yr.    Outpatient Medications Prior to Visit  Medication Sig Dispense Refill  . atorvastatin (LIPITOR) 80 MG tablet Take 1 tablet (80 mg  total) by mouth daily. 90 tablet 3  . cholecalciferol (VITAMIN D) 1000 UNITS tablet Take 1,000 Units by mouth daily.    . clopidogrel (PLAVIX) 75 MG tablet Take 1 tablet (75 mg total) by mouth daily. 90 tablet 3  . diazepam (VALIUM) 2 MG tablet TAKE ONE TABLET BY MOUTH TWICE DAILY 60 tablet 5  . famotidine (PEPCID) 40 MG tablet TAKE ONE TABLET BY MOUTH DAILY WITH LUNCH 30 tablet 2  . ferrous sulfate 325 (65 FE) MG tablet Take 325 mg by mouth daily.     . fexofenadine (ALLEGRA) 180 MG tablet Take 180 mg by mouth daily. Take 1 tablet daily for allergies    . lisinopril (ZESTRIL) 5 MG tablet TAKE ONE TABLET BY MOUTH DAILY 90 tablet 3  . Magnesium 500 MG CAPS Take by mouth.    . Omega-3 Fatty Acids (FISH OIL) 1000 MG CAPS Take 2,000 mg by mouth daily.     Marland Kitchen tiotropium (SPIRIVA HANDIHALER) 18 MCG inhalation capsule Place 1 capsule (18 mcg total) into inhaler and inhale daily. 30 capsule 11  . pantoprazole (PROTONIX) 40 MG tablet Take 1 tablet (40 mg total) by mouth 2 (two) times daily. (Patient taking differently: Take 40 mg by mouth daily. ) 180 tablet 3   No facility-administered medications prior to visit.    Allergies  Allergen Reactions  . Doxycycline Shortness Of Breath and Swelling    Lips swelling and short of breath  . Fentanyl Nausea And Vomiting    Pt states all pain medications cause N/V for her Pt states all pain medications cause N/V for her  . Fluocinolone Nausea And Vomiting  . Meperidine Nausea And Vomiting  . Penicillins Hives  . Breo Ellipta [Fluticasone Furoate-Vilanterol] Other (See Comments)    Lower extremity weakness, lightheadedness     ROS As per HPI  PE: Vitals with BMI 04/16/2020 03/20/2020 01/29/2020  Height 5\' 4"  5\' 4"  5\' 4"   Weight 137 lbs 6 oz 140 lbs 6 oz 144 lbs 6 oz  BMI 23.57 36.62 94.76  Systolic 546 503 546  Diastolic 70 58 78  Pulse 81 94 78  O2 sat on RA today is 98%  Gen: Alert, well appearing.  Patient is oriented to person, place, time, and  situation. AFFECT: pleasant, lucid thought and speech. CV: RRR, no m/r/g.  LUNGS: CTA bilat, nonlabored resps, good aeration in all lung fields. ABD: soft, NT/ND BACK: nontender, no CVA tenderness EXT: no clubbing or cyanosis.  no edema.    LABS:    Chemistry      Component Value Date/Time   NA 138 01/29/2020 0847   K 4.7 01/29/2020 0847   CL 104 01/29/2020 0847   CO2 27 01/29/2020 0847   BUN 17 01/29/2020 0847   CREATININE 0.72 01/29/2020 0847      Component Value Date/Time   CALCIUM 9.7 01/29/2020 0847   ALKPHOS 100 01/29/2020 0847   AST 20 01/29/2020 0847   ALT 25 01/29/2020 0847   BILITOT 1.9 (H) 01/29/2020 0847     Lab Results  Component Value Date   HGBA1C 5.7 01/29/2020   CC POC UA today is completely normal.  IMPRESSION AND PLAN:  UTI suspected, even though urine looks normal today. Sent urine for c/s. Start bactrim ds 1 bid x 3-5 d. May continue azo prn b/c this seemed to have helped sx's.  An After Visit Summary was printed and given to the patient.  FOLLOW UP: Return if symptoms worsen or fail to improve.  Signed:  Crissie Sickles, MD           04/16/2020

## 2020-04-17 LAB — URINE CULTURE
MICRO NUMBER:: 10737622
SPECIMEN QUALITY:: ADEQUATE

## 2020-04-21 ENCOUNTER — Other Ambulatory Visit: Payer: Self-pay

## 2020-04-25 ENCOUNTER — Other Ambulatory Visit: Payer: Self-pay | Admitting: Family Medicine

## 2020-04-29 ENCOUNTER — Encounter: Payer: Self-pay | Admitting: Family Medicine

## 2020-04-30 ENCOUNTER — Other Ambulatory Visit: Payer: Self-pay

## 2020-04-30 ENCOUNTER — Ambulatory Visit (INDEPENDENT_AMBULATORY_CARE_PROVIDER_SITE_OTHER): Payer: PPO

## 2020-04-30 DIAGNOSIS — R300 Dysuria: Secondary | ICD-10-CM

## 2020-04-30 LAB — POCT URINALYSIS DIPSTICK
Bilirubin, UA: NEGATIVE
Blood, UA: NEGATIVE
Glucose, UA: NEGATIVE
Ketones, UA: NEGATIVE
Leukocytes, UA: NEGATIVE
Nitrite, UA: NEGATIVE
Protein, UA: NEGATIVE
Spec Grav, UA: 1.025 (ref 1.010–1.025)
Urobilinogen, UA: 0.2 E.U./dL
pH, UA: 5.5 (ref 5.0–8.0)

## 2020-04-30 MED ORDER — CIPROFLOXACIN HCL 500 MG PO TABS
500.0000 mg | ORAL_TABLET | Freq: Two times a day (BID) | ORAL | 0 refills | Status: DC
Start: 2020-04-30 — End: 2020-05-27

## 2020-04-30 NOTE — Telephone Encounter (Signed)
Please advise, thanks.

## 2020-04-30 NOTE — Telephone Encounter (Signed)
Needs to come in for lab visit to give urine specimen. I'll do a different antibiotic but don't take until she has given specimen. Pls eRx ciprofloxacin 500 mg, 1 bid x 5 days, #10, no RF.  Pls enter lab for urine culture, dx is dysuria. Ask if her symptoms every went away for a while or did they just persist the same?-thx

## 2020-04-30 NOTE — Telephone Encounter (Signed)
Patient sent 2nd message stating the following: I am feeling the pain/urgency as before.

## 2020-04-30 NOTE — Telephone Encounter (Signed)
Spoke with patient and symptoms persist the same. Rx sent in, future lab entered in [place]. She will be coming in today to do urine specimen.

## 2020-05-01 LAB — URINE CULTURE
MICRO NUMBER:: 10792048
SPECIMEN QUALITY:: ADEQUATE

## 2020-05-08 ENCOUNTER — Encounter: Payer: Self-pay | Admitting: Family Medicine

## 2020-05-08 DIAGNOSIS — R300 Dysuria: Secondary | ICD-10-CM

## 2020-05-08 NOTE — Telephone Encounter (Signed)
Patient is still experiencing urinary symptoms. She would like to proceed with urology referral for further evaluation.

## 2020-05-08 NOTE — Telephone Encounter (Signed)
[place] urology referral ordered

## 2020-05-11 NOTE — Telephone Encounter (Signed)
I spoke with patient.  She reports on Saturday she walked from her car to her house and then went up 7 steps leading into her house.  She became very short of breath and had chest tightness.  This is the first time she has had chest tightness.  Tightness lasted about a minute.  Went away after she went in the house and cooled down.  On Sunday she had shortness of breath when going up stairs at her daughter's house.  No chest tightness.  She walks daily and has shortness of breath when going up small incline during her walk. Shortness of breath happens every time she goes up stairs.  This does not seem to have worsened since last office visit in June with Dr Irish Lack.  She is concerned because she never had chest tightness before. Will forward to Dr Irish Lack for review/recommendations

## 2020-05-12 MED ORDER — NITROGLYCERIN 0.4 MG SL SUBL
0.4000 mg | SUBLINGUAL_TABLET | SUBLINGUAL | 6 refills | Status: AC | PRN
Start: 2020-05-12 — End: ?

## 2020-05-12 MED ORDER — ISOSORBIDE MONONITRATE ER 30 MG PO TB24
30.0000 mg | ORAL_TABLET | Freq: Every day | ORAL | 3 refills | Status: DC
Start: 2020-05-12 — End: 2020-05-27

## 2020-05-12 NOTE — Telephone Encounter (Signed)
I spoke with patient and went over information from Dr Irish Lack with her.  Use of SL NTG explained to patient.  I asked her to let us know if symptoms continue. Prescriptions sent to [facility] pharmacy

## 2020-05-13 NOTE — Telephone Encounter (Signed)
Called and spoke to patient. She states that she believes her breathlessness was anxiety. She states that she feels better now and will continue taking the imdur.

## 2020-05-15 ENCOUNTER — Other Ambulatory Visit: Payer: Self-pay

## 2020-05-15 ENCOUNTER — Emergency Department (HOSPITAL_BASED_OUTPATIENT_CLINIC_OR_DEPARTMENT_OTHER): Payer: PPO

## 2020-05-15 ENCOUNTER — Encounter (HOSPITAL_BASED_OUTPATIENT_CLINIC_OR_DEPARTMENT_OTHER): Payer: Self-pay | Admitting: *Deleted

## 2020-05-15 ENCOUNTER — Emergency Department (HOSPITAL_BASED_OUTPATIENT_CLINIC_OR_DEPARTMENT_OTHER)
Admission: EM | Admit: 2020-05-15 | Discharge: 2020-05-15 | Disposition: A | Discharge status: Home / Self Care | Payer: PPO | Attending: Emergency Medicine | Admitting: Emergency Medicine

## 2020-05-15 ENCOUNTER — Telehealth: Payer: Self-pay | Admitting: Interventional Cardiology

## 2020-05-15 DIAGNOSIS — R112 Nausea with vomiting, unspecified: Secondary | ICD-10-CM | POA: Diagnosis not present

## 2020-05-15 DIAGNOSIS — R42 Dizziness and giddiness: Secondary | ICD-10-CM | POA: Diagnosis not present

## 2020-05-15 DIAGNOSIS — Z87891 Personal history of nicotine dependence: Secondary | ICD-10-CM | POA: Insufficient documentation

## 2020-05-15 DIAGNOSIS — R000 Tachycardia, unspecified: Secondary | ICD-10-CM | POA: Diagnosis not present

## 2020-05-15 DIAGNOSIS — Z79899 Other long term (current) drug therapy: Secondary | ICD-10-CM | POA: Insufficient documentation

## 2020-05-15 DIAGNOSIS — K449 Diaphragmatic hernia without obstruction or gangrene: Secondary | ICD-10-CM | POA: Diagnosis not present

## 2020-05-15 DIAGNOSIS — T50905A Adverse effect of unspecified drugs, medicaments and biological substances, initial encounter: Secondary | ICD-10-CM

## 2020-05-15 DIAGNOSIS — J449 Chronic obstructive pulmonary disease, unspecified: Secondary | ICD-10-CM | POA: Insufficient documentation

## 2020-05-15 DIAGNOSIS — I10 Essential (primary) hypertension: Secondary | ICD-10-CM | POA: Insufficient documentation

## 2020-05-15 DIAGNOSIS — R1013 Epigastric pain: Secondary | ICD-10-CM | POA: Insufficient documentation

## 2020-05-15 DIAGNOSIS — I2510 Atherosclerotic heart disease of native coronary artery without angina pectoris: Secondary | ICD-10-CM | POA: Insufficient documentation

## 2020-05-15 DIAGNOSIS — R519 Headache, unspecified: Secondary | ICD-10-CM | POA: Insufficient documentation

## 2020-05-15 DIAGNOSIS — R1110 Vomiting, unspecified: Secondary | ICD-10-CM | POA: Diagnosis not present

## 2020-05-15 DIAGNOSIS — R5383 Other fatigue: Secondary | ICD-10-CM | POA: Diagnosis not present

## 2020-05-15 DIAGNOSIS — T465X5A Adverse effect of other antihypertensive drugs, initial encounter: Secondary | ICD-10-CM | POA: Diagnosis not present

## 2020-05-15 LAB — URINALYSIS, ROUTINE W REFLEX MICROSCOPIC
Bilirubin Urine: NEGATIVE
Glucose, UA: NEGATIVE mg/dL
Hgb urine dipstick: NEGATIVE
Ketones, ur: 15 mg/dL — AB
Leukocytes,Ua: NEGATIVE
Nitrite: NEGATIVE
Protein, ur: NEGATIVE mg/dL
Specific Gravity, Urine: >1.03 — ABNORMAL HIGH (ref 1.005–1.030)
pH: 6 (ref 5.0–8.0)

## 2020-05-15 LAB — COMPREHENSIVE METABOLIC PANEL WITH GFR
ALT: 34 U/L (ref 0–44)
AST: 27 U/L (ref 15–41)
Albumin: 4.7 g/dL (ref 3.5–5.0)
Alkaline Phosphatase: 99 U/L (ref 38–126)
Anion gap: 12 (ref 5–15)
BUN: 15 mg/dL (ref 8–23)
CO2: 24 mmol/L (ref 22–32)
Calcium: 9.7 mg/dL (ref 8.9–10.3)
Chloride: 101 mmol/L (ref 98–111)
Creatinine, Ser: 0.68 mg/dL (ref 0.44–1.00)
GFR calc Af Amer: >60 mL/min
GFR calc non Af Amer: >60 mL/min
Glucose, Bld: 138 mg/dL — ABNORMAL HIGH (ref 70–99)
Potassium: 4.2 mmol/L (ref 3.5–5.1)
Sodium: 137 mmol/L (ref 135–145)
Total Bilirubin: 2.3 mg/dL — ABNORMAL HIGH (ref 0.3–1.2)
Total Protein: 7.9 g/dL (ref 6.5–8.1)

## 2020-05-15 LAB — CBC
HCT: 46.9 % — ABNORMAL HIGH (ref 36.0–46.0)
Hemoglobin: 15.4 g/dL — ABNORMAL HIGH (ref 12.0–15.0)
MCH: 30.9 pg (ref 26.0–34.0)
MCHC: 32.8 g/dL (ref 30.0–36.0)
MCV: 94 fL (ref 80.0–100.0)
Platelets: 279 10*3/uL (ref 150–400)
RBC: 4.99 MIL/uL (ref 3.87–5.11)
RDW: 12.4 % (ref 11.5–15.5)
WBC: 8.9 10*3/uL (ref 4.0–10.5)
nRBC: 0 % (ref 0.0–0.2)

## 2020-05-15 LAB — TROPONIN I (HIGH SENSITIVITY): Troponin I (High Sensitivity): 3 ng/L (ref <18)

## 2020-05-15 LAB — LIPASE, BLOOD: Lipase: 28 U/L (ref 11–51)

## 2020-05-15 MED ORDER — SODIUM CHLORIDE 0.9 % IV BOLUS
1000.0000 mL | Freq: Once | INTRAVENOUS | Status: AC
  Administered 2020-05-15: 1000 mL via INTRAVENOUS

## 2020-05-15 MED ORDER — PROCHLORPERAZINE EDISYLATE 10 MG/2ML IJ SOLN
10.0000 mg | Freq: Once | INTRAMUSCULAR | Status: AC
  Administered 2020-05-15: 10 mg via INTRAVENOUS
  Filled 2020-05-15: qty 2

## 2020-05-15 MED ORDER — DIPHENHYDRAMINE HCL 50 MG/ML IJ SOLN
25.0000 mg | Freq: Once | INTRAMUSCULAR | Status: AC
  Administered 2020-05-15: 25 mg via INTRAVENOUS
  Filled 2020-05-15: qty 1

## 2020-05-15 NOTE — Telephone Encounter (Signed)
New Message   Pt c/o medication issue:  1. Name of Medication: isosorbide mononitrate (IMDUR) 30 MG 24 hr tablet     2. How are you currently taking this medication (dosage and times per day)?   3. Are you having a reaction (difficulty breathing--STAT)?   4. What is your medication issue? Patients daughter is calling on her behalf. She states that he mother has been vomiting and not been able to keep anything.down. Please reach to patient per daughter she may be headed to the ED soon.

## 2020-05-15 NOTE — ED Notes (Signed)
Pt given ginger ale & lance crackers

## 2020-05-15 NOTE — ED Notes (Signed)
Taken to CT at this time. 

## 2020-05-15 NOTE — ED Triage Notes (Signed)
She feels like she is having a reaction to Imdur that she started taking 3 days ago. She has a headache, vomiting and abdominal pain x 3 days.

## 2020-05-15 NOTE — Telephone Encounter (Signed)
Left message for patient to call back. Patient is currently in the ED.

## 2020-05-15 NOTE — Discharge Instructions (Signed)
As we discussed, stop taking the Imdur.  You will need to follow-up with your cardiologist.  Please call his office if you have not heard from them in the next few days to arrange outpatient follow-up appointment.  Return to the emergency department for any chest pain, difficulty breathing, vomiting, difficulty walking, numbness/weakness of your arms or legs or any other worsening concerning symptoms.

## 2020-05-15 NOTE — ED Provider Notes (Signed)
[facility] EMERGENCY DEPARTMENT Provider Note   CSN: [id number] Arrival date & time: 05/15/20  1215     History Chief Complaint  Patient presents with  . Abdominal Pain    [name] is a 69 y.o. female history of abdominal bruit, carotid artery occlusion, COPD, GERD, hyperlipidemia, hypertension who presents for evaluation of nausea/vomiting, epigastric abdominal pain.  She reports that for the last 3 days she has felt "bad."  She reports that about 6 days ago, she was at home and had this sensation of an elephant sitting on her chest.  She called her cardiologist and was started on 30 mg Imdur and given sublingual nitro.  She states that she started the Imdur about 3 days ago.  Afterwards, she felt tired, lightheaded and developed a headache.  She states that she also felt like she had some breathlessness.  She tried taking it again 1 more day and states that after she took it, she started feeling lightheaded, with a headache.  She states that this morning, she started having vomiting, prompting ED visit.  She also reports that she had had some epigastric pain that was brought on by taking the medication.  She states that she has not had any more chest pain since that initial episode about 6 days ago.  She states that the headache is a dull ache across her entire head.  She does not get headaches frequently.  She is on Plavix.  No preceding trauma, injury, fall.  She has not taken anything for the headache.  She has gotten Covid vaccinated.  She does not know of any known COVID-19 exposure.  She denies any fevers, vision changes, difficulty breathing, numbness/weakness of arms or legs, dysuria, hematuria.  The history is provided by the patient.       Past Medical History:  Diagnosis Date  . Abdominal bruit 04/2017   Aortic u/s showed NO ANEURISM.  Marland Kitchen Allergy    SEASONAL  . Anxiety   . BCC (basal cell carcinoma), arm, right   . Carotid artery occlusion   . Cataract   .  Colon cancer screening 05/2017   Colonoscopy reportedly normal approx 2008.  Cologuard NEG--repeat 3 yrs.  Marland Kitchen COPD (chronic obstructive pulmonary disease) ([place])    DOE, worse since 11/2018.  Stress test neg 01/2019.  Albut x 1 inh prn helpful some.  Pulm 06/2019, PFTs not diag of COPD, DOE likely from deconditioning.  . Essential hypertension, benign 05/22/2014  . GERD (gastroesophageal reflux disease)   . H/O hiatal hernia 02/2019   EGD-->moderate size->surgery recommended but pt declined.  . Hematochezia 2020/21   internal hemorrhoids  . History of iron deficiency anemia 07/2019   Dr. Bryan Lemma EGD and colonoscopy, some anorectal muscosal ulceration, o/w just mild chronic gastritis.   Also could be poor absorption due to chronic high dose acid suppression. Hb/iron normalized aft iron infus and ongoing oral iron.  . Melanoma ([place])   . Mitral valve prolapse   . Mixed hyperlipidemia 05/22/2014  . Osteopenia 06/06/2016   05/2016 DEXA T-score -1.5.  06/2018 DEXA T score -1.1  . Peripheral vascular disease, unspecified ([facility]) 05/22/2014   innominate and carotid.  U/S f/u 12/2016 showed no signif change compared to 2017--vascular recommended repeat 1 yr.  Doing well as of 08/2018 cardiol f/u-->continued on ASA and Plavix. F/u 08/2019->ASA d/c'd, plavix continued.  . Prediabetes 2018   2018 A1c 6.3%.  05/2018 A1c 6.1%. 07/2019 A1c 6.4%.  Marland Kitchen Reflux esophagitis   .  Stroke [facility]) 2006   Mini  Stroke- not major  . Tobacco abuse, in remission    quit 2009    Patient Active Problem List   Diagnosis Date Noted  . Overweight (BMI 25.0-29.9) 08/01/2019  . DOE (dyspnea on exertion) 05/25/2019  . Oral lesion 02/20/2019  . Epistaxis, recurrent 02/20/2019  . Mixed hyperlipidemia 05/22/2014  . Essential hypertension, benign 05/22/2014  . Peripheral vascular disease ([place]) 05/22/2014  . Aftercare following surgery of the circulatory system, [facility] 09/17/2013  . Carotid artery disease without cerebral  infarction ([place]) 08/06/2012    Past Surgical History:  Procedure Laterality Date  . ABDOMINAL HYSTERECTOMY  1997  . CARDIAC CATHETERIZATION  01/2012   NORMAL  . CARDIOVASCULAR STRESS TEST  02/05/2019   NORMAL.  EF normal.  . CAROTID ENDARTERECTOMY  08/21/2007   right - Dr. Amedeo Plenty  . CHOLECYSTECTOMY  2000  . COLONOSCOPY  approx 2008; 09/23/19   2008 (Dr. Lenon Ahmadi pt->normal.  08/2019 adenoma, some friable areas of colon + anorectal mucosa inflam w/ ulceration, +diverticulosis. Recall 5 yrs  . DEXA  06/06/2016   05/2016 T-score -1.5.  06/2018 T score -1.1.  Repeat 2 yrs.  . ESOPHAGOGASTRODUODENOSCOPY  03/11/2019; 09/23/19   02/2019; 5 cm hiatal hernia; GI suggested surgical repair due to intractable GER.  08/2019 mild chronic gastritis, h pylori neg, multiple benign bx's including duodenum neg for celiac changes.  . Incision and drainage of left thenar space abscess  02/23/15   s/p cat bite (Dr. Amedeo Plenty)  . innominate stent  2007   12/2014 u/s showed patent stent in innominate and L common carotid.  L prox int carotid 40-59% stenosis  . LDCT for lung ca screening  06/2019   NEG->rpt 1 yr  . Left common carotid stent  2006 and 2007   "                    "                  "                          "                             "  . TRANSTHORACIC ECHOCARDIOGRAM  2007   NORMAL  . US CAROTID DOPPLER BILATERAL ([facility])  06/2019   left stent w/out stenosis.  R ICA sten 40-50% obst-stable. Rpt 1 yr.     OB History   No obstetric history on file.     Family History  Problem Relation Age of Onset  . Heart attack Mother   . CAD Mother   . Heart disease Mother        Before age 53  . Hypertension Mother   . Hyperlipidemia Mother   . Hypertension Father   . Pneumonia Father   . Hyperlipidemia Father   . Heart murmur Sister   . Osteoporosis Paternal Grandmother   . CAD Paternal Grandfather   . Breast cancer Neg Hx   . Colon cancer Neg Hx   . Esophageal cancer Neg Hx   . Rectal  cancer Neg Hx   . Stomach cancer Neg Hx   . Colon polyps Neg Hx     Social History   Tobacco Use  . Smoking status: Former Smoker    Types: Cigarettes    Quit date:  09/27/2007    Years since quitting: 12.6  . Smokeless tobacco: Never Used  Vaping Use  . Vaping Use: Never used  Substance Use Topics  . Alcohol use: No  . Drug use: No    Home Medications Prior to Admission medications   Medication Sig Start Date End Date Taking? Authorizing Provider  atorvastatin (LIPITOR) 80 MG tablet Take 1 tablet (80 mg total) by mouth daily. 08/20/19   McGowen, Adrian Blackwater, MD  cholecalciferol (VITAMIN D) 1000 UNITS tablet Take 1,000 Units by mouth daily.    [provider]  ciprofloxacin (CIPRO) 500 MG tablet Take 1 tablet (500 mg total) by mouth 2 (two) times daily. 04/30/20   McGowen, Adrian Blackwater, MD  clopidogrel (PLAVIX) 75 MG tablet Take 1 tablet (75 mg total) by mouth daily. 08/20/19   McGowen, Adrian Blackwater, MD  diazepam (VALIUM) 2 MG tablet TAKE ONE TABLET BY MOUTH TWICE DAILY 01/31/20   McGowen, Adrian Blackwater, MD  famotidine (PEPCID) 40 MG tablet TAKE ONE TABLET BY MOUTH DAILY WITH LUNCH 04/27/20   McGowen, Adrian Blackwater, MD  ferrous sulfate 325 (65 FE) MG tablet Take 325 mg by mouth daily.     [provider]  fexofenadine (ALLEGRA) 180 MG tablet Take 180 mg by mouth daily. Take 1 tablet daily for allergies    [provider]  fluticasone (FLONASE) 50 MCG/ACT nasal spray Place 2 sprays into both nostrils daily. 04/16/20   McGowen, Adrian Blackwater, MD  isosorbide mononitrate (IMDUR) 30 MG 24 hr tablet Take 1 tablet (30 mg total) by mouth daily. 05/12/20   Jettie Booze, MD  lisinopril (ZESTRIL) 5 MG tablet TAKE ONE TABLET BY MOUTH DAILY 08/20/19   McGowen, Adrian Blackwater, MD  Magnesium 500 MG CAPS Take by mouth.    [provider]  nitroGLYCERIN (NITROSTAT) 0.4 MG SL tablet Place 1 tablet (0.4 mg total) under the tongue every 5 (five) minutes as needed for chest pain. 05/12/20   Jettie Booze, MD  Omega-3 Fatty Acids (FISH OIL) 1000 MG CAPS Take 2,000 mg by mouth daily.     [provider]  pantoprazole (PROTONIX) 40 MG tablet Take 1 tablet (40 mg total) by mouth daily. 04/16/20   McGowen, Adrian Blackwater, MD  tiotropium (SPIRIVA HANDIHALER) 18 MCG inhalation capsule Place 1 capsule (18 mcg total) into inhaler and inhale daily. 07/05/19 07/04/20  Candee Furbish, MD    Allergies    Doxycycline, Fentanyl, Fluocinolone, Meperidine, Penicillins, Breo ellipta [fluticasone furoate-vilanterol], and Imdur [isosorbide nitrate]  Review of Systems   Review of Systems  Constitutional: Positive for fatigue. Negative for fever.  Eyes: Negative for visual disturbance.  Respiratory: Negative for cough and shortness of breath.   Cardiovascular: Negative for chest pain.  Gastrointestinal: Positive for abdominal pain, nausea and vomiting.  Genitourinary: Negative for dysuria and hematuria.  Neurological: Positive for headaches. Negative for weakness.  All other systems reviewed and are negative.   Physical Exam Updated Vital Signs BP (!) 156/83 (BP Location: Left Arm)   Pulse 87   Temp 97.8 F (36.6 C) (Oral)   Resp 18   Ht 5\' 4"  (1.626 m)   Wt 61.2 kg   SpO2 99%   BMI 23.17 kg/m   Physical Exam Vitals and nursing note reviewed.  Constitutional:      Appearance: Normal appearance. She is well-developed.     Comments: Sitting comfortably on examination table  HENT:     Head: Normocephalic and atraumatic.  Eyes:  General: Lids are normal.     Conjunctiva/sclera: Conjunctivae normal.     Pupils: Pupils are equal, round, and reactive to light.     Comments: PERRL. EOMs intact. No nystagmus. No neglect.   Neck:     Comments: Neck is supple and without rigidity Cardiovascular:     Rate and Rhythm: Normal rate and regular rhythm.     Pulses: Normal pulses.          Radial pulses are 2+ on the right side and 2+ on the left side.       Dorsalis pedis pulses are 2+ on  the right side and 2+ on the left side.     Heart sounds: Normal heart sounds. No murmur heard.  No friction rub. No gallop.   Pulmonary:     Effort: Pulmonary effort is normal.     Breath sounds: Normal breath sounds.     Comments: Abdomen is soft, non-distended, non-tender. No rigidity, No guarding. No peritoneal signs. Abdominal:     Palpations: Abdomen is soft. Abdomen is not rigid.     Tenderness: There is no abdominal tenderness. There is no guarding.     Comments: Abdomen is soft, non-distended, non-tender. No rigidity, No guarding. No peritoneal signs. No CVA tenderness noted.   Musculoskeletal:        General: Normal range of motion.     Cervical back: Full passive range of motion without pain.  Skin:    General: Skin is warm and dry.     Capillary Refill: Capillary refill takes less than 2 seconds.  Neurological:     Mental Status: She is alert and oriented to person, place, and time.     Comments: Cranial nerves III-XII intact Follows commands, Moves all extremities  5/5 strength to BUE and BLE  Sensation intact throughout all major nerve distributions No gait abnormalities.  No slurred speech. No facial droop.   Psychiatric:        Speech: Speech normal.     ED Results / Procedures / Treatments   Labs (all labs ordered are listed, but only abnormal results are displayed) Labs Reviewed  COMPREHENSIVE METABOLIC PANEL - Abnormal; Notable for the following components:      Result Value   Glucose, Bld 138 (*)    Total Bilirubin 2.3 (*)    All other components within normal limits  CBC - Abnormal; Notable for the following components:   Hemoglobin 15.4 (*)    HCT 46.9 (*)    All other components within normal limits  URINALYSIS, ROUTINE W REFLEX MICROSCOPIC - Abnormal; Notable for the following components:   Specific Gravity, Urine >1.030 (*)    Ketones, ur 15 (*)    All other components within normal limits  LIPASE, BLOOD  TROPONIN I (HIGH SENSITIVITY)     EKG EKG Interpretation  Date/Time:  Friday May 15 2020 14:57:44 EDT Ventricular Rate:  103 PR Interval:    QRS Duration: 90 QT Interval:  360 QTC Calculation: 472 R Axis:   95 Text Interpretation: Sinus tachycardia Probable right ventricular hypertrophy Borderline T abnormalities, anterior leads Confirmed by Madalyn Rob [phone]) on 05/15/2020 3:18:11 PM   Radiology CT Head Wo Contrast  Result Date: 05/15/2020 CLINICAL DATA:  Headache, vomiting and abdominal pain for 3 days. EXAM: CT HEAD WITHOUT CONTRAST TECHNIQUE: Contiguous axial images were obtained from the base of the skull through the vertex without intravenous contrast. COMPARISON:  None. FINDINGS: Brain: No evidence of acute infarction, hemorrhage, hydrocephalus, extra-axial  collection or mass lesion/mass effect. Vascular: No hyperdense vessel or unexpected calcification. Skull: Intact.  No focal lesion. Sinuses/Orbits: Status post cataract surgery on the right. Otherwise unremarkable. Other: None. IMPRESSION: Negative head CT. Electronically Signed   By: Inge Rise M.D.   On: 05/15/2020 15:07   DG Chest Portable 1 View  Result Date: 05/15/2020 CLINICAL DATA:  Dizziness, medication reaction EXAM: PORTABLE CHEST 1 VIEW COMPARISON:  12/10/2018 FINDINGS: Single frontal view of the chest demonstrates a stable hiatal hernia. Cardiac silhouette is unremarkable. No airspace disease, effusion, or pneumothorax. No acute bony abnormalities. IMPRESSION: 1. No acute intrathoracic process. Electronically Signed   By: Randa Ngo M.D.   On: 05/15/2020 15:44    Procedures Procedures (including critical care time)  Medications Ordered in ED Medications  prochlorperazine (COMPAZINE) injection 10 mg (10 mg Intravenous Given 05/15/20 1454)  diphenhydrAMINE (BENADRYL) injection 25 mg (25 mg Intravenous Given 05/15/20 1453)  sodium chloride 0.9 % bolus 1,000 mL (0 mLs Intravenous Stopped 05/15/20 1630)    ED Course  I have  reviewed the triage vital signs and the nursing notes.  Pertinent labs & imaging results that were available during my care of the patient were reviewed by me and considered in my medical decision making (see chart for details).    MDM Rules/Calculators/A&P                          69 year old female with possible history of hyperlipidemia, hypertension, carotid artery occlusion presents for evaluation of vomiting, abdominal pain, fatigue, headache.  Correlates it with starting Imdur 3 days ago.  No fevers.  She is on Plavix but does not know of any falls, trauma.  She states she does not frequently get headaches.  Does report an episode of chest pain about 6 days ago.  None since.  On initially arrival, she is afebrile, nontoxic-appearing.  Vital signs are stable.  On exam, she has no abdominal tenderness to palpation.  No neuro deficits noted.  Question if this is medication reaction to Imdur.  Also given new onset headache, age, nausea/vomiting, neuro for possible intracranial abnormality such as space-occupying lesion.  Doubt intracranial hemorrhage.  History/physical exam not concerning for CVA.  ACS appears less likely.  She does see cardiology for strong family history, hyperlipidemia.  She has not had any chest pain since the initial episode 6 days ago.  Plan to check labs, CT head to rule out any intracranial normality that could be contributing to her symptoms.  CMP shows normal BUN and creatinine.  Total bili is 2.3.  Lipase is normal.  UA showed no evidence of infection.  CBC shows no leukocytosis.  Hemoglobin is 15.4. Troponin is 3.  Patient had a stress test done on 02/05/19 that was normal.   Reevaluation.  Patient has not had any chest pain since her initial onset about 6 days ago.  She has negative tropes and nonischemic appearing EKG here in the ED today.  Additionally, patient with no abdominal pain.  At this time, no negation for further evaluation via CT on pelvis as do not suspect  surgical abdomen.  Will consult cardiology.  Discussed patient with Dr. Meda Coffee (Cardiology) who agrees with plan to stop the Imdur.  She will let patient's cardiologist know and arrange outpatient follow-up.  Patient states she feels better.  Headache is gone.  She has not had any vomiting here in the ED.  At this time, she has no active  chest pain and do not suspect ongoing ACS etiology as the contributing factor of her symptoms.  I suspect this is related to Imdur.  She has been instructed not to take it anymore.  Patient instructed to follow-up with her cardiology as directed. Discussed patient with Dr. Roslynn Amble who is agreeable. Patient had ample opportunity for questions and discussion. All patient's questions were answered with full understanding. Strict return precautions discussed. Patient expresses understanding and agreement to plan.   Portions of this note were generated with [employer]. Dictation errors may occur despite best attempts at proofreading.  Final Clinical Impression(s) / ED Diagnoses Final diagnoses:  Non-intractable vomiting with nausea, unspecified vomiting type  Adverse effect of drug, initial encounter    Rx / DC Orders ED Discharge Orders    None       Desma Mcgregor 05/15/20 1817    Lucrezia Starch, MD 05/18/20 1515

## 2020-05-18 NOTE — Telephone Encounter (Signed)
Per pt ED determined pt was allergic to Isosorbide and this was stopped ./cy

## 2020-05-18 NOTE — Progress Notes (Signed)
CARDIOLOGY OFFICE NOTE  Date:  05/27/2020    [name] Date of Birth: August 11, 1951 Medical Record #[id number]  PCP:  Tammi Sou, MD  Cardiologist:  [facility]    Chief Complaint  Patient presents with  . Hospitalization Follow-up    Follow up visit - seen for Dr. Irish Lack    History of Present Illness: [name] is a 69 y.o. female who presents today for a work in visit. Seen for Dr. Irish Lack.   She has a history of known + FH for CAD, early vascular disease, COPD, GERD, HTN and difficult to control HLD. She has had significant carotid disease in the past with stents placed in therightinnominate artery (2007)and leftcommon carotid artery (2006) and R CEA in 2008.  Cardiac cath in 2013 showing no significant coronary artery disease and only minimal coronary calcification. Low risk Myoview from May of 2020.   She was last seen in June of 2021. She had retired. Has mild DOE with mild emphysema. Felt to be doing ok.    Then presented to the ER in August with feeling bad, N, V and headache. Had previously had chest pain and called here - Imdur had been started however this made her feel very tired, lightheaded and with severe headache. Noted ER discussed with Dr. Meda Coffee who advised stopping Imdur with out patient follow up.   Comes in today. Here alone.  Notes that she had chest pressure on her birthday after walking up 7 steps to her front door. Had been outside for a while.  She called on Monday. This lasted only 7 seconds. Sent a My Chart message and was started on Imdur - did not tolerate. Says she was so sick with N, V and pounding of her heart - actually filled up her dog bowls thinking she would die and wanted her dogs taken care of. Ended up in [place]. Imdur was stopped. She has done fine since then. She walks 2 miles every day - she push mows her yard and has done fine with all this since. BP looks fine. She is quite happy with how she is doing.   Past  Medical History:  Diagnosis Date  . Abdominal bruit 04/2017   Aortic u/s showed NO ANEURISM.  Marland Kitchen Allergy    SEASONAL  . Anxiety   . BCC (basal cell carcinoma), arm, right   . Carotid artery occlusion   . Cataract   . Colon cancer screening 05/2017   Colonoscopy reportedly normal approx 2008.  Cologuard NEG--repeat 3 yrs.  Marland Kitchen COPD (chronic obstructive pulmonary disease) ([place])    DOE, worse since 11/2018.  Stress test neg 01/2019.  Albut x 1 inh prn helpful some.  Pulm 06/2019, PFTs not diag of COPD, DOE likely from deconditioning.  . Essential hypertension, benign 05/22/2014  . GERD (gastroesophageal reflux disease)   . H/O hiatal hernia 02/2019   EGD-->moderate size->surgery recommended but pt declined.  . Hematochezia 2020/21   internal hemorrhoids  . History of iron deficiency anemia 07/2019   Dr. Bryan Lemma EGD and colonoscopy, some anorectal muscosal ulceration, o/w just mild chronic gastritis.   Also could be poor absorption due to chronic high dose acid suppression. Hb/iron normalized aft iron infus and ongoing oral iron.  . Melanoma ([place])   . Mitral valve prolapse   . Mixed hyperlipidemia 05/22/2014  . Osteopenia 06/06/2016   05/2016 DEXA T-score -1.5.  06/2018 DEXA T score -1.1  . Peripheral vascular disease, unspecified ([place])  05/22/2014   innominate and carotid.  U/S f/u 12/2016 showed no signif change compared to 2017--vascular recommended repeat 1 yr.  Doing well as of 08/2018 cardiol f/u-->continued on ASA and Plavix. F/u 08/2019->ASA d/c'd, plavix continued.  . Prediabetes 2018   2018 A1c 6.3%.  05/2018 A1c 6.1%. 07/2019 A1c 6.4%.  Marland Kitchen Reflux esophagitis   . Stroke [facility]) 2006   Mini  Stroke- not major  . Tobacco abuse, in remission    quit 2009    Past Surgical History:  Procedure Laterality Date  . ABDOMINAL HYSTERECTOMY  1997  . CARDIAC CATHETERIZATION  01/2012   NORMAL  . CARDIOVASCULAR STRESS TEST  02/05/2019   NORMAL.  EF normal.  . CAROTID ENDARTERECTOMY   08/21/2007   right - Dr. Amedeo Plenty  . CHOLECYSTECTOMY  2000  . COLONOSCOPY  approx 2008; 09/23/19   2008 (Dr. Lenon Ahmadi pt->normal.  08/2019 adenoma, some friable areas of colon + anorectal mucosa inflam w/ ulceration, +diverticulosis. Recall 5 yrs  . DEXA  06/06/2016   05/2016 T-score -1.5.  06/2018 T score -1.1.  Repeat 2 yrs.  . ESOPHAGOGASTRODUODENOSCOPY  03/11/2019; 09/23/19   02/2019; 5 cm hiatal hernia; GI suggested surgical repair due to intractable GER.  08/2019 mild chronic gastritis, h pylori neg, multiple benign bx's including duodenum neg for celiac changes.  . Incision and drainage of left thenar space abscess  02/23/15   s/p cat bite (Dr. Amedeo Plenty)  . innominate stent  2007   12/2014 u/s showed patent stent in innominate and L common carotid.  L prox int carotid 40-59% stenosis  . LDCT for lung ca screening  06/2019   NEG->rpt 1 yr  . Left common carotid stent  2006 and 2007   "                    "                  "                          "                             "  . TRANSTHORACIC ECHOCARDIOGRAM  2007   NORMAL  . US CAROTID DOPPLER BILATERAL ([place] [facility])  06/2019   left stent w/out stenosis.  R ICA sten 40-50% obst-stable. Rpt 1 yr.     Medications: Current Meds  Medication Sig  . atorvastatin (LIPITOR) 80 MG tablet Take 1 tablet (80 mg total) by mouth daily.  . cholecalciferol (VITAMIN D) 1000 UNITS tablet Take 1,000 Units by mouth daily.  . clopidogrel (PLAVIX) 75 MG tablet Take 1 tablet (75 mg total) by mouth daily.  . diazepam (VALIUM) 2 MG tablet TAKE ONE TABLET BY MOUTH TWICE DAILY  . famotidine (PEPCID) 40 MG tablet TAKE ONE TABLET BY MOUTH DAILY WITH LUNCH  . ferrous sulfate 325 (65 FE) MG tablet Take 325 mg by mouth daily.   . fexofenadine (ALLEGRA) 180 MG tablet Take 180 mg by mouth daily. Take 1 tablet daily for allergies  . fluticasone (FLONASE) 50 MCG/ACT nasal spray Place 2 sprays into both nostrils daily.  Marland Kitchen lisinopril (ZESTRIL) 5 MG tablet TAKE ONE  TABLET BY MOUTH DAILY  . Magnesium 500 MG CAPS Take by mouth.  . nitroGLYCERIN (NITROSTAT) 0.4 MG SL tablet Place 1 tablet (0.4 mg total) under the tongue  every 5 (five) minutes as needed for chest pain.  . Omega-3 Fatty Acids (FISH OIL) 1000 MG CAPS Take 2,000 mg by mouth daily.   . pantoprazole (PROTONIX) 40 MG tablet Take 1 tablet (40 mg total) by mouth daily.  Marland Kitchen tiotropium (SPIRIVA HANDIHALER) 18 MCG inhalation capsule Place 1 capsule (18 mcg total) into inhaler and inhale daily.     Allergies: Allergies  Allergen Reactions  . Doxycycline Shortness Of Breath and Swelling    Lips swelling and short of breath  . Fentanyl Nausea And Vomiting    Pt states all pain medications cause N/V for her Pt states all pain medications cause N/V for her  . Fluocinolone Nausea And Vomiting  . Meperidine Nausea And Vomiting  . Penicillins Hives  . Breo Ellipta [Fluticasone Furoate-Vilanterol] Other (See Comments)    Lower extremity weakness, lightheadedness   . Imdur [Isosorbide Nitrate] Nausea And Vomiting    Social History: The patient  reports that she quit smoking about 12 years ago. Her smoking use included cigarettes. She has never used smokeless tobacco. She reports that she does not drink alcohol and does not use drugs.   Family History: The patient's family history includes CAD in her mother and paternal grandfather; Heart attack in her mother; Heart disease in her mother; Heart murmur in her sister; Hyperlipidemia in her father and mother; Hypertension in her father and mother; Osteoporosis in her paternal grandmother; Pneumonia in her father.   Review of Systems: Please see the history of present illness.   All other systems are reviewed and negative.   Physical Exam: VS:  BP 110/86   Pulse 67   Ht 5' 4.5" (1.638 m)   Wt 135 lb 12.8 oz (61.6 kg)   SpO2 99%   BMI 22.95 kg/m  .  BMI Body mass index is 22.95 kg/m.  Wt Readings from Last 3 Encounters:  05/27/20 135 lb 12.8 oz  (61.6 kg)  05/15/20 135 lb (61.2 kg)  04/16/20 137 lb 6.4 oz (62.3 kg)    General: Alert and in no acute distress.   Cardiac: Regular rate and rhythm. No murmurs, rubs, or gallops. No edema.  Respiratory:  Lungs are clear to auscultation bilaterally with normal work of breathing.  GI: Soft and nontender.  MS: No deformity or atrophy. Gait and ROM intact.  Skin: Warm and dry. Color is normal.  Neuro:  Strength and sensation are intact and no gross focal deficits noted.  Psych: Alert, appropriate and with normal affect.   LABORATORY DATA:  EKG:  EKG is not ordered today.    Lab Results  Component Value Date   WBC 8.9 05/15/2020   HGB 15.4 (H) 05/15/2020   HCT 46.9 (H) 05/15/2020   PLT 279 05/15/2020   GLUCOSE 138 (H) 05/15/2020   CHOL 130 01/29/2020   TRIG 200.0 (H) 01/29/2020   HDL 39.70 01/29/2020   LDLDIRECT 74.0 11/28/2017   LDLCALC 50 01/29/2020   ALT 34 05/15/2020   AST 27 05/15/2020   NA 137 05/15/2020   K 4.2 05/15/2020   CL 101 05/15/2020   CREATININE 0.68 05/15/2020   BUN 15 05/15/2020   CO2 24 05/15/2020   TSH 1.10 08/01/2019   INR 0.9 08/17/2007   HGBA1C 5.7 01/29/2020     BNP (last 3 results) No results for input(s): BNP in the last 8760 hours.  ProBNP (last 3 results) No results for input(s): PROBNP in the last 8760 hours.   Other Studies Reviewed Today:  Myoview Study Highlights 01/2019    Nuclear stress EF: 56%.  The left ventricular ejection fraction is normal (55-65%).  No T wave inversion was noted during stress.  There was no ST segment deviation noted during stress.  The study is normal.  This is a low risk study.   Normal perfusion. LVEF 56% with normal wall motion. This is a low risk study.     ASSESSMENT AND PLAN:  1. Chest pain - very short spell - 7 seconds - has not recurred - certainly has multiple risk factors for CAD - has not recurred - would favor continuing with current medical management - off Imdur - if  she were to have recurring spells - then would need further evaluation. She is agreeable to this plan and will let us know. She has great exercise tolerance.   2. Intolerance to Imdur - noted in her allergies now.   3. PAD with prior interventions - she is on chronic Plavix therapy.   4. HLD - on statin therapy.   5. HTN - BP looks good - no change with current regimen - on low dose ACE.   6. COPD  Current medicines are reviewed with the patient today.  The patient does not have concerns regarding medicines other than what has been noted above.  The following changes have been made:  See above.  Labs/ tests ordered today include:   No orders of the defined types were placed in this encounter.    Disposition:   FU with Dr. Irish Lack as planned in March.    Patient is agreeable to this plan and will call if any problems develop in the interim.   SignedTruitt Merle, NP  05/27/2020 11:13 AM  [facility] [address] [place], [place]  [address] Phone: [phone] Fax: [phone]

## 2020-05-27 ENCOUNTER — Encounter: Payer: Self-pay | Admitting: Nurse Practitioner

## 2020-05-27 ENCOUNTER — Ambulatory Visit: Payer: PPO | Admitting: Nurse Practitioner

## 2020-05-27 ENCOUNTER — Other Ambulatory Visit: Payer: Self-pay

## 2020-05-27 VITALS — BP 110/86 | HR 67 | Ht 64.5 in | Wt 135.8 lb

## 2020-05-27 DIAGNOSIS — R079 Chest pain, unspecified: Secondary | ICD-10-CM | POA: Diagnosis not present

## 2020-05-27 DIAGNOSIS — N952 Postmenopausal atrophic vaginitis: Secondary | ICD-10-CM | POA: Diagnosis not present

## 2020-05-27 DIAGNOSIS — N3281 Overactive bladder: Secondary | ICD-10-CM | POA: Diagnosis not present

## 2020-05-27 DIAGNOSIS — R300 Dysuria: Secondary | ICD-10-CM | POA: Diagnosis not present

## 2020-05-27 DIAGNOSIS — E782 Mixed hyperlipidemia: Secondary | ICD-10-CM

## 2020-05-27 DIAGNOSIS — R102 Pelvic and perineal pain: Secondary | ICD-10-CM | POA: Diagnosis not present

## 2020-05-27 DIAGNOSIS — I10 Essential (primary) hypertension: Secondary | ICD-10-CM | POA: Diagnosis not present

## 2020-05-27 DIAGNOSIS — Z789 Other specified health status: Secondary | ICD-10-CM

## 2020-05-27 NOTE — Patient Instructions (Addendum)
After Visit Summary:  We will be checking the following labs today - NONE   Medication Instructions:    Continue with your current medicines.    If you need a refill on your cardiac medications before your next appointment, please call your pharmacy.     Testing/Procedures To Be Arranged:  N/A  Follow-Up:   See Dr. Irish Lack in March per recall    At [facility], you and your health needs are our priority.  As part of our continuing mission to provide you with exceptional heart care, we have created designated Provider Care Teams.  These Care Teams include your primary Cardiologist (physician) and Advanced Practice Providers (APPs -  Physician Assistants and Nurse Practitioners) who all work together to provide you with the care you need, when you need it.  Special Instructions:  . Stay safe, wash your hands for at least 20 seconds and wear a mask when needed.  . It was good to talk with you today. . Let us know if your chest discomfort returns.     Call the [place] office at [phone] if you have any questions, problems or concerns.

## 2020-06-17 DIAGNOSIS — Z961 Presence of intraocular lens: Secondary | ICD-10-CM | POA: Diagnosis not present

## 2020-06-17 DIAGNOSIS — H43812 Vitreous degeneration, left eye: Secondary | ICD-10-CM | POA: Diagnosis not present

## 2020-06-17 DIAGNOSIS — H2512 Age-related nuclear cataract, left eye: Secondary | ICD-10-CM | POA: Diagnosis not present

## 2020-06-17 DIAGNOSIS — H18513 Endothelial corneal dystrophy, bilateral: Secondary | ICD-10-CM | POA: Diagnosis not present

## 2020-06-17 DIAGNOSIS — H40013 Open angle with borderline findings, low risk, bilateral: Secondary | ICD-10-CM | POA: Diagnosis not present

## 2020-06-30 ENCOUNTER — Other Ambulatory Visit: Payer: Self-pay | Admitting: *Deleted

## 2020-06-30 DIAGNOSIS — I6523 Occlusion and stenosis of bilateral carotid arteries: Secondary | ICD-10-CM

## 2020-06-30 DIAGNOSIS — Z9889 Other specified postprocedural states: Secondary | ICD-10-CM

## 2020-07-06 ENCOUNTER — Other Ambulatory Visit: Payer: Self-pay | Admitting: Family Medicine

## 2020-07-06 NOTE — Telephone Encounter (Signed)
Disregard blank message. Pt should have enough medication to last until upcoming appt.

## 2020-07-06 NOTE — Telephone Encounter (Signed)
So I guess you are saying I don't need to do these RFs?

## 2020-07-08 DIAGNOSIS — N3281 Overactive bladder: Secondary | ICD-10-CM | POA: Diagnosis not present

## 2020-07-08 DIAGNOSIS — N952 Postmenopausal atrophic vaginitis: Secondary | ICD-10-CM | POA: Diagnosis not present

## 2020-07-08 NOTE — Telephone Encounter (Signed)
Correct medication was last filled on 06/22/20. I spoke with pt and she said that she doesn't think she will need it before her appt

## 2020-07-09 ENCOUNTER — Encounter: Payer: Self-pay | Admitting: Family Medicine

## 2020-07-16 ENCOUNTER — Ambulatory Visit (HOSPITAL_COMMUNITY)
Admission: RE | Admit: 2020-07-16 | Discharge: 2020-07-16 | Disposition: A | Discharge status: Home / Self Care | Payer: PPO | Source: Ambulatory Visit | Attending: Surgery | Admitting: Surgery

## 2020-07-16 ENCOUNTER — Ambulatory Visit (INDEPENDENT_AMBULATORY_CARE_PROVIDER_SITE_OTHER): Payer: PPO | Admitting: Physician Assistant

## 2020-07-16 ENCOUNTER — Encounter: Payer: Self-pay | Admitting: Physician Assistant

## 2020-07-16 ENCOUNTER — Other Ambulatory Visit: Payer: Self-pay

## 2020-07-16 VITALS — BP 143/87 | HR 81 | Temp 98.2°F | Resp 20 | Ht 64.5 in | Wt 137.7 lb

## 2020-07-16 DIAGNOSIS — I6523 Occlusion and stenosis of bilateral carotid arteries: Secondary | ICD-10-CM

## 2020-07-16 DIAGNOSIS — Z9889 Other specified postprocedural states: Secondary | ICD-10-CM | POA: Diagnosis not present

## 2020-07-16 NOTE — Progress Notes (Signed)
Office Note     CC:  follow up Requesting Provider:  Tammi Sou, MD  HPI: [name] is a 69 y.o. (1951/09/06) female who presents for follow up of carotid stenosis. She has remote history of left common carotid artery stent 03/31/05, innominate artery stent 06/23/06, and right carotid endarterectomy 08/21/07 by Dr. Amedeo Plenty. She has a history of TIA's but no stroke.   She does not have any amaurosis fugax, slurred speech, facial drooping, weakness or numbness of upper or lower extremities. She does report that she has had very intermittent episodes of dizziness. This occurs mostly when she is lying down, like the room is spinning. She says she has had vertigo in the past and this feels very similar. She said she only had one instance where she went from sitting to standing quickly and felt dizzy but it resolved with sitting back down for a few moments. She additionally says that recently she has been having some right shoulder/ upper chest area discomfort that she feels started after she began weed whacking. She says it usually only occurs during or after doing her yard. She otherwise does not feel any pain in right arm, or any numbness or tingling. Usually this will resolve with rest.    She is very active and walks >2 miles daily. She denies any claudication, rest pain or non healing wounds  The pt is on a statin for cholesterol management.  The pt is on a daily aspirin.   Other AC: Plavix The pt is on ACEI for hypertension.   The pt is pre- diabetic Tobacco hx:  Former smoker, quit 2006  Past Medical History:  Diagnosis Date  . Abdominal bruit 04/2017   Aortic u/s showed NO ANEURISM.  Marland Kitchen Allergy    SEASONAL  . Anxiety   . BCC (basal cell carcinoma), arm, right   . Carotid artery occlusion   . Cataract   . Colon cancer screening 05/2017   Colonoscopy reportedly normal approx 2008.  Cologuard NEG--repeat 3 yrs.  Marland Kitchen COPD (chronic obstructive pulmonary disease) ([place])    DOE,  worse since 11/2018.  Stress test neg 01/2019.  Albut x 1 inh prn helpful some.  Pulm 06/2019, PFTs not diag of COPD, DOE likely from deconditioning.  . Essential hypertension, benign 05/22/2014  . GERD (gastroesophageal reflux disease)   . H/O hiatal hernia 02/2019   EGD-->moderate size->surgery recommended but pt declined.  . Hematochezia 2020/21   internal hemorrhoids  . History of iron deficiency anemia 07/2019   Dr. Bryan Lemma EGD and colonoscopy, some anorectal muscosal ulceration, o/w just mild chronic gastritis.   Also could be poor absorption due to chronic high dose acid suppression. Hb/iron normalized aft iron infus and ongoing oral iron.  . Melanoma ([place])   . Mitral valve prolapse   . Mixed hyperlipidemia 05/22/2014  . OAB (overactive bladder)    detrol LA from urol helpful  . Osteopenia 06/06/2016   05/2016 DEXA T-score -1.5.  06/2018 DEXA T score -1.1  . Peripheral vascular disease, unspecified ([place]) 05/22/2014   innominate and carotid.  U/S f/u 12/2016 showed no signif change compared to 2017--vascular recommended repeat 1 yr.  Doing well as of 08/2018 cardiol f/u-->continued on ASA and Plavix. F/u 08/2019->ASA d/c'd, plavix continued.  . Prediabetes 2018   2018 A1c 6.3%.  05/2018 A1c 6.1%. 07/2019 A1c 6.4%.  Marland Kitchen Reflux esophagitis   . Stroke [facility]) 2006   Mini  Stroke- not major  . Tobacco abuse, in remission  quit 2007/12/28    Past Surgical History:  Procedure Laterality Date  . ABDOMINAL HYSTERECTOMY  1997  . CARDIAC CATHETERIZATION  01/2012   NORMAL  . CARDIOVASCULAR STRESS TEST  02/05/2019   NORMAL.  EF normal.  . CAROTID ENDARTERECTOMY  08/21/2007   right - Dr. Amedeo Plenty  . CHOLECYSTECTOMY  2000  . COLONOSCOPY  approx 12-28-06; 09/23/19   Dec 28, 2006 (Dr. Lenon Ahmadi pt->normal.  08/2019 adenoma, some friable areas of colon + anorectal mucosa inflam w/ ulceration, +diverticulosis. Recall 5 yrs  . DEXA  06/06/2016   05/2016 T-score -1.5.  06/2018 T score -1.1.  Repeat 2 yrs.  .  ESOPHAGOGASTRODUODENOSCOPY  03/11/2019; 09/23/19   02/2019; 5 cm hiatal hernia; GI suggested surgical repair due to intractable GER.  08/2019 mild chronic gastritis, h pylori neg, multiple benign bx's including duodenum neg for celiac changes.  . Incision and drainage of left thenar space abscess  02/23/15   s/p cat bite (Dr. Amedeo Plenty)  . innominate stent  2007   12/2014 u/s showed patent stent in innominate and L common carotid.  L prox int carotid 40-59% stenosis  . LDCT for lung ca screening  06/2019   NEG->rpt 1 yr  . Left common carotid stent  12/27/04 and December 27, 2005   "                    "                  "                          "                             "  . TRANSTHORACIC ECHOCARDIOGRAM  12-27-05   NORMAL  . US CAROTID DOPPLER BILATERAL ([place] [facility])  06/2019   left stent w/out stenosis.  R ICA sten 40-50% obst-stable. Rpt 1 yr.    Social History   Socioeconomic History  . Marital status: Married    Spouse name: Not on file  . Number of children: Not on file  . Years of education: Not on file  . Highest education level: Not on file  Occupational History  . Not on file  Tobacco Use  . Smoking status: Former Smoker    Types: Cigarettes    Quit date: 09/27/2007    Years since quitting: 12.8  . Smokeless tobacco: Never Used  Vaping Use  . Vaping Use: Never used  Substance and Sexual Activity  . Alcohol use: No  . Drug use: No  . Sexual activity: Not on file  Other Topics Concern  . Not on file  Social History Narrative   Widowed as of 2014/12/28 (husband died of viral illness while out of town in [place], [place]), has one daughter.   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   Tobacco: 20 pack-yr hx, quit December 28, 2007.   Alcohol: rare.   No hx of alc or drug problems.   Exercise: walks 2.61miles on treadmill daily.   Social Determinants of Health   Financial Resource Strain:   . Difficulty of Paying Living Expenses: Not on file  Food Insecurity:   .  Worried About Charity fundraiser in the Last Year: Not on file  . Ran Out of Food in the Last Year: Not on file  Transportation  Needs:   . Lack of Transportation (Medical): Not on file  . Lack of Transportation (Non-Medical): Not on file  Physical Activity:   . Days of Exercise per Week: Not on file  . Minutes of Exercise per Session: Not on file  Stress:   . Feeling of Stress : Not on file  Social Connections:   . Frequency of Communication with Friends and Family: Not on file  . Frequency of Social Gatherings with Friends and Family: Not on file  . Attends Religious Services: Not on file  . Active Member of Clubs or Organizations: Not on file  . Attends Archivist Meetings: Not on file  . Marital Status: Not on file  Intimate Partner Violence:   . Fear of Current or Ex-Partner: Not on file  . Emotionally Abused: Not on file  . Physically Abused: Not on file  . Sexually Abused: Not on file    Family History  Problem Relation Age of Onset  . Heart attack Mother   . CAD Mother   . Heart disease Mother        Before age 30  . Hypertension Mother   . Hyperlipidemia Mother   . Hypertension Father   . Pneumonia Father   . Hyperlipidemia Father   . Heart murmur Sister   . Osteoporosis Paternal Grandmother   . CAD Paternal Grandfather   . Breast cancer Neg Hx   . Colon cancer Neg Hx   . Esophageal cancer Neg Hx   . Rectal cancer Neg Hx   . Stomach cancer Neg Hx   . Colon polyps Neg Hx     Current Outpatient Medications  Medication Sig Dispense Refill  . atorvastatin (LIPITOR) 80 MG tablet Take 1 tablet (80 mg total) by mouth daily. 90 tablet 3  . cholecalciferol (VITAMIN D) 1000 UNITS tablet Take 1,000 Units by mouth daily.    . clopidogrel (PLAVIX) 75 MG tablet Take 1 tablet (75 mg total) by mouth daily. 90 tablet 3  . diazepam (VALIUM) 2 MG tablet TAKE ONE TABLET BY MOUTH TWICE DAILY 60 tablet 5  . estradiol (ESTRACE) 0.1 MG/GM vaginal cream SMARTSIG:Topical     . famotidine (PEPCID) 40 MG tablet TAKE ONE TABLET BY MOUTH DAILY WITH LUNCH 30 tablet 2  . ferrous sulfate 325 (65 FE) MG tablet Take 325 mg by mouth daily.     . fexofenadine (ALLEGRA) 180 MG tablet Take 180 mg by mouth daily. Take 1 tablet daily for allergies    . fluticasone (FLONASE) 50 MCG/ACT nasal spray Place 2 sprays into both nostrils daily. 16 g 6  . lisinopril (ZESTRIL) 5 MG tablet TAKE ONE TABLET BY MOUTH DAILY 90 tablet 3  . Magnesium 500 MG CAPS Take by mouth.    . nitroGLYCERIN (NITROSTAT) 0.4 MG SL tablet Place 1 tablet (0.4 mg total) under the tongue every 5 (five) minutes as needed for chest pain. 25 tablet 6  . Omega-3 Fatty Acids (FISH OIL) 1000 MG CAPS Take 2,000 mg by mouth daily.     . pantoprazole (PROTONIX) 40 MG tablet Take 1 tablet (40 mg total) by mouth daily. 90 tablet 3  . tiotropium (SPIRIVA HANDIHALER) 18 MCG inhalation capsule Place 1 capsule (18 mcg total) into inhaler and inhale daily. 30 capsule 11  . tolterodine (DETROL LA) 4 MG 24 hr capsule Take 4 mg by mouth daily.     No current facility-administered medications for this visit.    Allergies  Allergen Reactions  .  Doxycycline Shortness Of Breath and Swelling    Lips swelling and short of breath  . Fentanyl Nausea And Vomiting    Pt states all pain medications cause N/V for her Pt states all pain medications cause N/V for her  . Fluocinolone Nausea And Vomiting  . Meperidine Nausea And Vomiting  . Penicillins Hives  . Breo Ellipta [Fluticasone Furoate-Vilanterol] Other (See Comments)    Lower extremity weakness, lightheadedness   . Imdur [Isosorbide Nitrate] Nausea And Vomiting     REVIEW OF SYSTEMS:  [X]  denotes positive finding, [ ]  denotes negative finding Cardiac  Comments:  Chest pain or chest pressure:    Shortness of breath upon exertion:    Short of breath when lying flat:    Irregular heart rhythm:        Vascular    Pain in calf, thigh, or hip brought on by ambulation:      Pain in feet at night that wakes you up from your sleep:     Blood clot in your veins:    Leg swelling:         Pulmonary    Oxygen at home:    Productive cough:     Wheezing:         Neurologic    Sudden weakness in arms or legs:     Sudden numbness in arms or legs:     Sudden onset of difficulty speaking or slurred speech:    Temporary loss of vision in one eye:     Problems with dizziness:         Gastrointestinal    Blood in stool:     Vomited blood:         Genitourinary    Burning when urinating:     Blood in urine:        Psychiatric    Major depression:         Hematologic    Bleeding problems:    Problems with blood clotting too easily:        Skin    Rashes or ulcers:        Constitutional    Fever or chills:      PHYSICAL EXAMINATION:  Vitals:   07/16/20 1034 07/16/20 1037  BP: 136/82 (!) 143/87  Pulse: 81   Resp: 20   Temp: 98.2 F (36.8 C)   TempSrc: Temporal   SpO2: 98%   Weight: 137 lb 11.2 oz (62.5 kg)   Height: 5' 4.5" (1.638 m)     General:  WDWN in NAD; vital signs documented above Gait: Normal HENT: WNL, normocephalic Pulmonary: normal non-labored breathing , without Rales, rhonchi,  wheezing Cardiac: regular HR, without  Murmurs without carotid bruit Vascular Exam/Pulses:  Right Left  Radial 2+ (normal) 2+ (normal)  Brachial 2+ (normal) 2+ (normal)  Femoral 2+ (normal) 2+ (normal)  Popliteal 2+ (normal) 2+ (normal)  DP 2+ (normal) 2+ (normal)  PT 2+ (normal) 2+ (normal)   Extremities: without ischemic changes, without Gangrene , without cellulitis; without open wounds; Normal ROM of upper extremities. 5/5 grip strength bilaterally. Hands warm. Motor and sensation intact Musculoskeletal: no muscle wasting or atrophy  Neurologic: A&O X 3;  No focal weakness or paresthesias are detected Psychiatric:  The pt has Normal affect.   Non-Invasive Vascular Imaging:   07/16/20 VAS US Carotid: Right Carotid: Patent right carotid  endarterectomy without evidence for restenosis. The innomininate stent appears patent, limited visability. Distal innominate artery distal stent area 341  cm/s (285 cm/s 07/15/19). Proximal stent 300 cm/s (245 cm/s 07/15/19)  Left Carotid: Velocities in the left ICA are consistent with a 40-59% stenosis.   Vertebrals: Bilateral vertebral arteries demonstrate antegrade flow.  Subclavians: Bilateral subclavian arteries were stenotic.  Right Brachial artery 61 cm/s triphasic waveform, Left Brachial artery 68 cm/s triphasic waveform.   ASSESSMENT/PLAN:: 69 y.o. 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 She has remote history of left common carotid artery stent 03/31/05, innominate artery stent 06/23/06, and right carotid endarterectomy 08/21/07 by Dr. Amedeo Plenty. She has been having some dizziness which I do not feel is attributable to her carotid disease as well as some right shoulder pain. Likely the dizziness is orthostatic or vertigo in origin and the shoulder pain I think is more musculoskeletal. She has good pulses in upper extremities and triphasic signals on doppler. Duplex shows patent right carotid s/p CEA, patent left carotid stent. There are elevated velocities in the right subclavian and innominate stent. The velocities have increased from last duplex but have previously been elevated. I discussed with patient that we will continue to monitor by duplex and on exam this area but do not feel any intervention is indicated at this time. -Reviewed signs and symptoms of TIA/ Stroke and she knows should these occur to seek immediate medical attention - Discussed with her that should she have new or worsening symptoms to call for earlier follow up - she will continue her Statin and Plavix - She will follow up in 1 year with repeat Carotid Duplex   Karoline Caldwell, PA-C [facility] [phone]  Clinic MD:  Dr. Oneida Alar

## 2020-07-20 ENCOUNTER — Other Ambulatory Visit: Payer: Self-pay | Admitting: Family Medicine

## 2020-07-30 ENCOUNTER — Other Ambulatory Visit: Payer: Self-pay

## 2020-07-31 ENCOUNTER — Ambulatory Visit (INDEPENDENT_AMBULATORY_CARE_PROVIDER_SITE_OTHER): Payer: PPO | Admitting: Family Medicine

## 2020-07-31 ENCOUNTER — Other Ambulatory Visit: Payer: Self-pay | Admitting: Family Medicine

## 2020-07-31 ENCOUNTER — Encounter: Payer: Self-pay | Admitting: Family Medicine

## 2020-07-31 VITALS — BP 113/74 | HR 86 | Temp 97.8°F | Resp 16 | Ht 63.5 in | Wt 136.2 lb

## 2020-07-31 DIAGNOSIS — I10 Essential (primary) hypertension: Secondary | ICD-10-CM

## 2020-07-31 DIAGNOSIS — Z79899 Other long term (current) drug therapy: Secondary | ICD-10-CM

## 2020-07-31 DIAGNOSIS — R7303 Prediabetes: Secondary | ICD-10-CM

## 2020-07-31 DIAGNOSIS — Z0000 Encounter for general adult medical examination without abnormal findings: Secondary | ICD-10-CM | POA: Diagnosis not present

## 2020-07-31 DIAGNOSIS — F411 Generalized anxiety disorder: Secondary | ICD-10-CM | POA: Diagnosis not present

## 2020-07-31 DIAGNOSIS — E348 Other specified endocrine disorders: Secondary | ICD-10-CM | POA: Diagnosis not present

## 2020-07-31 DIAGNOSIS — I739 Peripheral vascular disease, unspecified: Secondary | ICD-10-CM | POA: Diagnosis not present

## 2020-07-31 DIAGNOSIS — E7800 Pure hypercholesterolemia, unspecified: Secondary | ICD-10-CM

## 2020-07-31 DIAGNOSIS — Z1231 Encounter for screening mammogram for malignant neoplasm of breast: Secondary | ICD-10-CM

## 2020-07-31 DIAGNOSIS — M8580 Other specified disorders of bone density and structure, unspecified site: Secondary | ICD-10-CM

## 2020-07-31 LAB — COMPREHENSIVE METABOLIC PANEL
ALT: 34 U/L (ref 0–35)
AST: 27 U/L (ref 0–37)
Albumin: 4.9 g/dL (ref 3.5–5.2)
Alkaline Phosphatase: 102 U/L (ref 39–117)
BUN: 14 mg/dL (ref 6–23)
CO2: 31 mEq/L (ref 19–32)
Calcium: 10.5 mg/dL (ref 8.4–10.5)
Chloride: 98 mEq/L (ref 96–112)
Creatinine, Ser: 0.75 mg/dL (ref 0.40–1.20)
GFR: 81.34 mL/min (ref >60.00)
Glucose, Bld: 83 mg/dL (ref 70–99)
Potassium: 4.1 mEq/L (ref 3.5–5.1)
Sodium: 138 mEq/L (ref 135–145)
Total Bilirubin: 1.7 mg/dL — ABNORMAL HIGH (ref 0.2–1.2)
Total Protein: 7.7 g/dL (ref 6.0–8.3)

## 2020-07-31 LAB — CBC WITH DIFFERENTIAL/PLATELET
Basophils Absolute: 0.1 10*3/uL (ref 0.0–0.1)
Basophils Relative: 0.7 % (ref 0.0–3.0)
Eosinophils Absolute: 0.2 10*3/uL (ref 0.0–0.7)
Eosinophils Relative: 2.5 % (ref 0.0–5.0)
HCT: 47.9 % — ABNORMAL HIGH (ref 36.0–46.0)
Hemoglobin: 16 g/dL — ABNORMAL HIGH (ref 12.0–15.0)
Lymphocytes Relative: 37.2 % (ref 12.0–46.0)
Lymphs Abs: 2.8 10*3/uL (ref 0.7–4.0)
MCHC: 33.4 g/dL (ref 30.0–36.0)
MCV: 93.5 fl (ref 78.0–100.0)
Monocytes Absolute: 0.5 10*3/uL (ref 0.1–1.0)
Monocytes Relative: 7.2 % (ref 3.0–12.0)
Neutro Abs: 3.9 10*3/uL (ref 1.4–7.7)
Neutrophils Relative %: 52.4 % (ref 43.0–77.0)
Platelets: 350 10*3/uL (ref 150.0–400.0)
RBC: 5.12 Mil/uL — ABNORMAL HIGH (ref 3.87–5.11)
RDW: 13.3 % (ref 11.5–15.5)
WBC: 7.5 10*3/uL (ref 4.0–10.5)

## 2020-07-31 LAB — LIPID PANEL
Cholesterol: 173 mg/dL (ref 0–200)
HDL: 55.6 mg/dL (ref >39.00)
LDL Cholesterol: 83 mg/dL (ref 0–99)
NonHDL: 117.56
Total CHOL/HDL Ratio: 3
Triglycerides: 172 mg/dL — ABNORMAL HIGH (ref 0.0–149.0)
VLDL: 34.4 mg/dL (ref 0.0–40.0)

## 2020-07-31 LAB — TSH: TSH: 0.84 u[IU]/mL (ref 0.35–4.50)

## 2020-07-31 LAB — HEMOGLOBIN A1C: Hgb A1c MFr Bld: 6 % (ref 4.6–6.5)

## 2020-07-31 MED ORDER — DIAZEPAM 2 MG PO TABS
2.0000 mg | ORAL_TABLET | Freq: Two times a day (BID) | ORAL | 5 refills | Status: DC
Start: 2020-07-31 — End: 2021-01-15

## 2020-07-31 NOTE — Progress Notes (Signed)
Office Note 07/31/2020  CC:  Chief Complaint  Patient presents with  . Annual Exam    pt is fasting    HPI:  [name] is a 69 y.o. White female who is here for annual health maintenance exam + f/u HTN, prediabetes, GAD w/high risk med use.  HTN: home bp's 120s/70s consistently. Tries to avoid high carb and high fat foods.  Lots of protein shakes. Does treadmill 2 miles daily 3.3 mi/hr. Push mows yard twice per week.  PMP AWARE reviewed today: most recent rx for valium was filled 06/22/20, # 39, rx by me. No red flags.  Past Medical History:  Diagnosis Date  . Abdominal bruit 04/2017   Aortic u/s showed NO ANEURISM.  Marland Kitchen Allergy    SEASONAL  . Anxiety   . BCC (basal cell carcinoma), arm, right   . Carotid artery occlusion   . Cataract   . Colon cancer screening 05/2017   Colonoscopy reportedly normal approx 2008.  Cologuard NEG--repeat 3 yrs.  Marland Kitchen COPD (chronic obstructive pulmonary disease) ([place])    DOE, worse since 11/2018.  Stress test neg 01/2019.  Albut x 1 inh prn helpful some.  Pulm 06/2019, PFTs not diag of COPD, DOE likely from deconditioning.  . Essential hypertension, benign 05/22/2014  . GERD (gastroesophageal reflux disease)   . H/O hiatal hernia 02/2019   EGD-->moderate size->surgery recommended but pt declined.  . Hematochezia 2020/21   internal hemorrhoids  . History of iron deficiency anemia 07/2019   Dr. Bryan Lemma EGD and colonoscopy, some anorectal muscosal ulceration, o/w just mild chronic gastritis.   Also could be poor absorption due to chronic high dose acid suppression. Hb/iron normalized aft iron infus and ongoing oral iron.  . Melanoma ([place])   . Mitral valve prolapse   . Mixed hyperlipidemia 05/22/2014  . OAB (overactive bladder)    detrol LA from urol helpful  . Osteopenia 06/06/2016   05/2016 DEXA T-score -1.5.  06/2018 DEXA T score -1.1  . Peripheral vascular disease, unspecified ([place]) 05/22/2014   innominate and carotid.  U/S f/u  12/2016 showed no signif change compared to 2017--vascular recommended repeat 1 yr.  Doing well as of 08/2018 cardiol f/u-->continued on ASA and Plavix. F/u 08/2019->ASA d/c'd, plavix continued.  . Prediabetes 2018   2018 A1c 6.3%.  05/2018 A1c 6.1%. 07/2019 A1c 6.4%.  Marland Kitchen Reflux esophagitis   . Stroke [facility]) 2006   Mini  Stroke- not major  . Tobacco abuse, in remission    quit 2009    Past Surgical History:  Procedure Laterality Date  . ABDOMINAL HYSTERECTOMY  1997  . CARDIAC CATHETERIZATION  01/2012   NORMAL  . CARDIOVASCULAR STRESS TEST  02/05/2019   NORMAL.  EF normal.  . CAROTID ENDARTERECTOMY  08/21/2007   right - Dr. Amedeo Plenty  . CHOLECYSTECTOMY  2000  . COLONOSCOPY  approx 2008; 09/23/19   2008 (Dr. Lenon Ahmadi pt->normal.  08/2019 adenoma, some friable areas of colon + anorectal mucosa inflam w/ ulceration, +diverticulosis. Recall 5 yrs  . DEXA  06/06/2016   05/2016 T-score -1.5.  06/2018 T score -1.1.  Repeat 2 yrs.  . ESOPHAGOGASTRODUODENOSCOPY  03/11/2019; 09/23/19   02/2019; 5 cm hiatal hernia; GI suggested surgical repair due to intractable GER.  08/2019 mild chronic gastritis, h pylori neg, multiple benign bx's including duodenum neg for celiac changes.  . Incision and drainage of left thenar space abscess  02/23/15   s/p cat bite (Dr. Amedeo Plenty)  . innominate stent  2007  12/2014 u/s showed patent stent in innominate and L common carotid.  L prox int carotid 40-59% stenosis  . LDCT for lung ca screening  06/2019   NEG->rpt 1 yr  . Left common carotid stent  2004/12/16 and Dec 16, 2005   "                    "                  "                          "                             "  . TRANSTHORACIC ECHOCARDIOGRAM  2005/12/16   NORMAL  . US CAROTID DOPPLER BILATERAL ([place] [facility])  06/2019   left stent w/out stenosis.  R ICA sten 40-50% obst-stable. Rpt 1 yr.    Family History  Problem Relation Age of Onset  . Heart attack Mother   . CAD Mother   . Heart disease Mother        Before age 65  .  Hypertension Mother   . Hyperlipidemia Mother   . Hypertension Father   . Pneumonia Father   . Hyperlipidemia Father   . Heart murmur Sister   . Osteoporosis Paternal Grandmother   . CAD Paternal Grandfather   . Breast cancer Neg Hx   . Colon cancer Neg Hx   . Esophageal cancer Neg Hx   . Rectal cancer Neg Hx   . Stomach cancer Neg Hx   . Colon polyps Neg Hx     Social History   Socioeconomic History  . Marital status: Married    Spouse name: Not on file  . Number of children: Not on file  . Years of education: Not on file  . Highest education level: Not on file  Occupational History  . Not on file  Tobacco Use  . Smoking status: Former Smoker    Types: Cigarettes    Quit date: 09/27/2007    Years since quitting: 12.8  . Smokeless tobacco: Never Used  Vaping Use  . Vaping Use: Never used  Substance and Sexual Activity  . Alcohol use: No  . Drug use: No  . Sexual activity: Not on file  Other Topics Concern  . Not on file  Social History Narrative   Widowed as of Dec 17, 2014 (husband died of viral illness while out of town in [place], [place]), has one daughter.   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   Tobacco: 20 pack-yr hx, quit December 17, 2007.   Alcohol: rare.   No hx of alc or drug problems.   Exercise: walks 2.69miles on treadmill daily.   Social Determinants of Health   Financial Resource Strain:   . Difficulty of Paying Living Expenses: Not on file  Food Insecurity:   . Worried About Charity fundraiser in the Last Year: Not on file  . Ran Out of Food in the Last Year: Not on file  Transportation Needs:   . Lack of Transportation (Medical): Not on file  . Lack of Transportation (Non-Medical): Not on file  Physical Activity:   . Days of Exercise per Week: Not on file  . Minutes of Exercise per Session: Not on file  Stress:   . Feeling of  Stress : Not on file  Social Connections:   . Frequency of Communication with Friends  and Family: Not on file  . Frequency of Social Gatherings with Friends and Family: Not on file  . Attends Religious Services: Not on file  . Active Member of Clubs or Organizations: Not on file  . Attends Archivist Meetings: Not on file  . Marital Status: Not on file  Intimate Partner Violence:   . Fear of Current or Ex-Partner: Not on file  . Emotionally Abused: Not on file  . Physically Abused: Not on file  . Sexually Abused: Not on file    Outpatient Medications Prior to Visit  Medication Sig Dispense Refill  . atorvastatin (LIPITOR) 80 MG tablet Take 1 tablet (80 mg total) by mouth daily. 90 tablet 3  . cholecalciferol (VITAMIN D) 1000 UNITS tablet Take 1,000 Units by mouth daily.    . clopidogrel (PLAVIX) 75 MG tablet Take 1 tablet (75 mg total) by mouth daily. 90 tablet 3  . famotidine (PEPCID) 40 MG tablet TAKE ONE TABLET BY MOUTH DAILY WITH LUNCH 30 tablet 2  . ferrous sulfate 325 (65 FE) MG tablet Take 325 mg by mouth daily.     . fexofenadine (ALLEGRA) 180 MG tablet Take 180 mg by mouth daily. Take 1 tablet daily for allergies    . fluticasone (FLONASE) 50 MCG/ACT nasal spray Place 2 sprays into both nostrils daily. 16 g 6  . lisinopril (ZESTRIL) 5 MG tablet TAKE ONE TABLET BY MOUTH DAILY 90 tablet 3  . Magnesium 500 MG CAPS Take by mouth.    . Omega-3 Fatty Acids (FISH OIL) 1000 MG CAPS Take 2,000 mg by mouth daily.     . pantoprazole (PROTONIX) 40 MG tablet Take 1 tablet (40 mg total) by mouth daily. 90 tablet 3  . tolterodine (DETROL LA) 4 MG 24 hr capsule Take 4 mg by mouth daily.    . diazepam (VALIUM) 2 MG tablet TAKE ONE TABLET BY MOUTH TWICE DAILY 60 tablet 5  . estradiol (ESTRACE) 0.1 MG/GM vaginal cream SMARTSIG:Topical (Patient not taking: Reported on 07/31/2020)    . nitroGLYCERIN (NITROSTAT) 0.4 MG SL tablet Place 1 tablet (0.4 mg total) under the tongue every 5 (five) minutes as needed for chest pain. (Patient not taking: Reported on 07/31/2020) 25 tablet  6  . tiotropium (SPIRIVA HANDIHALER) 18 MCG inhalation capsule Place 1 capsule (18 mcg total) into inhaler and inhale daily. (Patient not taking: Reported on 07/31/2020) 30 capsule 11   No facility-administered medications prior to visit.    Allergies  Allergen Reactions  . Doxycycline Shortness Of Breath and Swelling    Lips swelling and short of breath  . Fentanyl Nausea And Vomiting    Pt states all pain medications cause N/V for her Pt states all pain medications cause N/V for her  . Fluocinolone Nausea And Vomiting  . Meperidine Nausea And Vomiting  . Penicillins Hives  . Breo Ellipta [Fluticasone Furoate-Vilanterol] Other (See Comments)    Lower extremity weakness, lightheadedness   . Imdur [Isosorbide Nitrate] Nausea And Vomiting    ROS Review of Systems  Constitutional: Negative for appetite change, chills, fatigue and fever.  HENT: Negative for congestion, dental problem, ear pain and sore throat.   Eyes: Negative for discharge, redness and visual disturbance.  Respiratory: Negative for cough, chest tightness, shortness of breath and wheezing.   Cardiovascular: Negative for chest pain, palpitations and leg swelling.  Gastrointestinal: Negative for abdominal pain, blood  in stool, diarrhea, nausea and vomiting.  Genitourinary: Negative for difficulty urinating, dysuria, flank pain, frequency, hematuria and urgency.  Musculoskeletal: Negative for arthralgias, back pain, joint swelling, myalgias and neck stiffness.  Skin: Negative for pallor and rash.  Neurological: Negative for dizziness, speech difficulty, weakness and headaches.  Hematological: Negative for adenopathy. Does not bruise/bleed easily.  Psychiatric/Behavioral: Negative for confusion and sleep disturbance. The patient is not nervous/anxious.     PE; Vitals with BMI 07/31/2020 07/16/2020 07/16/2020  Height 5' 3.5" - 5' 4.5"  Weight 136 lbs 3 oz - 137 lbs 11 oz  BMI 63.01 - 60.10  Systolic 932 355 732   Diastolic 74 87 82  Pulse 86 - 81    Exam chaperoned by Kavin Leech, CMA.  Gen: Alert, well appearing.  Patient is oriented to person, place, time, and situation. AFFECT: pleasant, lucid thought and speech. ENT: Ears: EACs clear, normal epithelium.  TMs with good light reflex and landmarks bilaterally.  Eyes: no injection, icteris, swelling, or exudate.  EOMI, PERRLA. Nose: no drainage or turbinate edema/swelling.  No injection or focal lesion.  Mouth: lips without lesion/swelling.  Oral mucosa pink and moist.  Dentition intact and without obvious caries or gingival swelling.  Oropharynx without erythema, exudate, or swelling.  Neck: supple/nontender.  No LAD, mass, or TM.  Carotid pulses 2+ bilaterally, without bruits. CV: RRR, no m/r/g.   LUNGS: CTA bilat, nonlabored resps, good aeration in all lung fields. ABD: soft, NT, ND, BS normal.  No hepatospenomegaly or mass.  No bruits. EXT: no clubbing, cyanosis, or edema.  Musculoskeletal: no joint swelling, erythema, warmth, or tenderness.  ROM of all joints intact. Skin - no sores or suspicious lesions or rashes or color changes   Pertinent labs:  Lab Results  Component Value Date   TSH 1.10 08/01/2019   Lab Results  Component Value Date   WBC 8.9 05/15/2020   HGB 15.4 (H) 05/15/2020   HCT 46.9 (H) 05/15/2020   MCV 94.0 05/15/2020   PLT 279 05/15/2020   Lab Results  Component Value Date   CREATININE 0.68 05/15/2020   BUN 15 05/15/2020   NA 137 05/15/2020   K 4.2 05/15/2020   CL 101 05/15/2020   CO2 24 05/15/2020   Lab Results  Component Value Date   ALT 34 05/15/2020   AST 27 05/15/2020   ALKPHOS 99 05/15/2020   BILITOT 2.3 (H) 05/15/2020   Lab Results  Component Value Date   CHOL 130 01/29/2020   Lab Results  Component Value Date   HDL 39.70 01/29/2020   Lab Results  Component Value Date   LDLCALC 50 01/29/2020   Lab Results  Component Value Date   TRIG 200.0 (H) 01/29/2020   Lab Results  Component  Value Date   CHOLHDL 3 01/29/2020   Lab Results  Component Value Date   HGBA1C 5.7 01/29/2020    ASSESSMENT AND PLAN:   HTN: stable. Cont lisinopril. Lytes/cr today.  Prediab: diet fair-to-good. Exercising daily on treadmill. Hba1c and fasting glucose today.  GAD, high risk med use: stable. CSC UTD. UDS today. Rx for diazepam 2mg , 1 bid prn, #60, RF x 5 today.  Health maintenance exam: Reviewed age and gender appropriate health maintenance issues (prudent diet, regular exercise, health risks of tobacco and excessive alcohol, use of seatbelts, fire alarms in home, use of sunscreen).  Also reviewed age and gender appropriate health screening as well as vaccine recommendations. Vaccines: ALL UTD.  Flu->UTD. Labs: fasting HP +  A1c. Cervical ca screening: pt is s/p hysterectomy for benign dx.pt declines any further screening paps. Breast ca screening: screening mammogram due (last was 08/2019)->already scheduled. Colon ca screening: next colonoscopy due 2025. Osteoporosis screening: pt with osteopenia, due for repeat DEXA at this time->ordered to be done with mammogram.  An After Visit Summary was printed and given to the patient.  FOLLOW UP:  Return in about 6 months (around 01/28/2021) for routine chronic illness f/u.  Signed:  Crissie Sickles, MD           07/31/2020

## 2020-07-31 NOTE — Patient Instructions (Signed)
Health Maintenance, Female Adopting a healthy lifestyle and getting preventive care are important in promoting health and wellness. Ask your health care provider about:  The right schedule for you to have regular tests and exams.  Things you can do on your own to prevent diseases and keep yourself healthy. What should I know about diet, weight, and exercise? Eat a healthy diet   Eat a diet that includes plenty of vegetables, fruits, low-fat dairy products, and lean protein.  Do not eat a lot of foods that are high in solid fats, added sugars, or sodium. Maintain a healthy weight Body mass index (BMI) is used to identify weight problems. It estimates body fat based on height and weight. Your health care provider can help determine your BMI and help you achieve or maintain a healthy weight. Get regular exercise Get regular exercise. This is one of the most important things you can do for your health. Most adults should:  Exercise for at least 150 minutes each week. The exercise should increase your heart rate and make you sweat (moderate-intensity exercise).  Do strengthening exercises at least twice a week. This is in addition to the moderate-intensity exercise.  Spend less time sitting. Even light physical activity can be beneficial. Watch cholesterol and blood lipids Have your blood tested for lipids and cholesterol at 69 years of age, then have this test every 5 years. Have your cholesterol levels checked more often if:  Your lipid or cholesterol levels are high.  You are older than 69 years of age.  You are at high risk for heart disease. What should I know about cancer screening? Depending on your health history and family history, you may need to have cancer screening at various ages. This may include screening for:  Breast cancer.  Cervical cancer.  Colorectal cancer.  Skin cancer.  Lung cancer. What should I know about heart disease, diabetes, and high blood  pressure? Blood pressure and heart disease  High blood pressure causes heart disease and increases the risk of stroke. This is more likely to develop in people who have high blood pressure readings, are of African descent, or are overweight.  Have your blood pressure checked: ? Every 3-5 years if you are 18-39 years of age. ? Every year if you are 40 years old or older. Diabetes Have regular diabetes screenings. This checks your fasting blood sugar level. Have the screening done:  Once every three years after age 40 if you are at a normal weight and have a low risk for diabetes.  More often and at a younger age if you are overweight or have a high risk for diabetes. What should I know about preventing infection? Hepatitis B If you have a higher risk for hepatitis B, you should be screened for this virus. Talk with your health care provider to find out if you are at risk for hepatitis B infection. Hepatitis C Testing is recommended for:  Everyone born from 1945 through 1965.  Anyone with known risk factors for hepatitis C. Sexually transmitted infections (STIs)  Get screened for STIs, including gonorrhea and chlamydia, if: ? You are sexually active and are younger than 69 years of age. ? You are older than 69 years of age and your health care provider tells you that you are at risk for this type of infection. ? Your sexual activity has changed since you were last screened, and you are at increased risk for chlamydia or gonorrhea. Ask your health care provider if   you are at risk.  Ask your health care provider about whether you are at high risk for HIV. Your health care provider may recommend a prescription medicine to help prevent HIV infection. If you choose to take medicine to prevent HIV, you should first get tested for HIV. You should then be tested every 3 months for as long as you are taking the medicine. Pregnancy  If you are about to stop having your period (premenopausal) and  you may become pregnant, seek counseling before you get pregnant.  Take 400 to 800 micrograms (mcg) of folic acid every day if you become pregnant.  Ask for birth control (contraception) if you want to prevent pregnancy. Osteoporosis and menopause Osteoporosis is a disease in which the bones lose minerals and strength with aging. This can result in bone fractures. If you are 65 years old or older, or if you are at risk for osteoporosis and fractures, ask your health care provider if you should:  Be screened for bone loss.  Take a calcium or vitamin D supplement to lower your risk of fractures.  Be given hormone replacement therapy (HRT) to treat symptoms of menopause. Follow these instructions at home: Lifestyle  Do not use any products that contain nicotine or tobacco, such as cigarettes, e-cigarettes, and chewing tobacco. If you need help quitting, ask your health care provider.  Do not use street drugs.  Do not share needles.  Ask your health care provider for help if you need support or information about quitting drugs. Alcohol use  Do not drink alcohol if: ? Your health care provider tells you not to drink. ? You are pregnant, may be pregnant, or are planning to become pregnant.  If you drink alcohol: ? Limit how much you use to 0-1 drink a day. ? Limit intake if you are breastfeeding.  Be aware of how much alcohol is in your drink. In the [place] one drink equals one 12 oz bottle of beer (355 mL), one 5 oz glass of wine (148 mL), or one 1 oz glass of hard liquor (44 mL). General instructions  Schedule regular health, dental, and eye exams.  Stay current with your vaccines.  Tell your health care provider if: ? You often feel depressed. ? You have ever been abused or do not feel safe at home. Summary  Adopting a healthy lifestyle and getting preventive care are important in promoting health and wellness.  Follow your health care provider's instructions about healthy  diet, exercising, and getting tested or screened for diseases.  Follow your health care provider's instructions on monitoring your cholesterol and blood pressure. This information is not intended to replace advice given to you by your health care provider. Make sure you discuss any questions you have with your health care provider. Document Revised: 09/05/2018 Document Reviewed: 09/05/2018 Elsevier Patient Education  2020 [employer]  

## 2020-08-02 LAB — DRUG MONITORING, PANEL 8 WITH CONFIRMATION, URINE
6 Acetylmorphine: NEGATIVE ng/mL (ref <10)
Alcohol Metabolites: NEGATIVE ng/mL
Alphahydroxyalprazolam: NEGATIVE ng/mL (ref <25)
Alphahydroxymidazolam: NEGATIVE ng/mL (ref <50)
Alphahydroxytriazolam: NEGATIVE ng/mL (ref <50)
Aminoclonazepam: NEGATIVE ng/mL (ref <25)
Amphetamines: NEGATIVE ng/mL (ref <500)
Benzodiazepines: POSITIVE ng/mL — AB (ref <100)
Buprenorphine, Urine: NEGATIVE ng/mL (ref <5)
Cocaine Metabolite: NEGATIVE ng/mL (ref <150)
Creatinine: 68.5 mg/dL
Hydroxyethylflurazepam: NEGATIVE ng/mL (ref <50)
Lorazepam: NEGATIVE ng/mL (ref <50)
MDMA: NEGATIVE ng/mL (ref <500)
Marijuana Metabolite: NEGATIVE ng/mL (ref <20)
Nordiazepam: 275 ng/mL — ABNORMAL HIGH (ref <50)
Opiates: NEGATIVE ng/mL (ref <100)
Oxazepam: 649 ng/mL — ABNORMAL HIGH (ref <50)
Oxidant: NEGATIVE ug/mL
Oxycodone: NEGATIVE ng/mL (ref <100)
Temazepam: 460 ng/mL — ABNORMAL HIGH (ref <50)
pH: 7.3 (ref 4.5–9.0)

## 2020-08-02 LAB — DM TEMPLATE

## 2020-08-07 DIAGNOSIS — H43813 Vitreous degeneration, bilateral: Secondary | ICD-10-CM | POA: Diagnosis not present

## 2020-08-07 DIAGNOSIS — H2512 Age-related nuclear cataract, left eye: Secondary | ICD-10-CM | POA: Diagnosis not present

## 2020-08-07 DIAGNOSIS — Z961 Presence of intraocular lens: Secondary | ICD-10-CM | POA: Diagnosis not present

## 2020-08-07 DIAGNOSIS — H40013 Open angle with borderline findings, low risk, bilateral: Secondary | ICD-10-CM | POA: Diagnosis not present

## 2020-08-07 DIAGNOSIS — H18513 Endothelial corneal dystrophy, bilateral: Secondary | ICD-10-CM | POA: Diagnosis not present

## 2020-08-24 ENCOUNTER — Other Ambulatory Visit: Payer: Self-pay | Admitting: Family Medicine

## 2020-08-24 DIAGNOSIS — Z1231 Encounter for screening mammogram for malignant neoplasm of breast: Secondary | ICD-10-CM

## 2020-08-25 NOTE — Progress Notes (Signed)
Subjective:   [name] is a 69 y.o. female who presents for an Initial Medicare Annual Wellness Visit.  I connected with [name] today by telephone and verified that I am speaking with the correct person using two identifiers. Location patient: home Location provider: work Persons participating in the virtual visit: patient, Marine scientist.    I discussed the limitations, risks, security and privacy concerns of performing an evaluation and management service by telephone and the availability of in person appointments. I also discussed with the patient that there may be a patient responsible charge related to this service. The patient expressed understanding and verbally consented to this telephonic visit.    Interactive audio and video telecommunications were attempted between this provider and patient, however failed, due to patient having technical difficulties OR patient did not have access to video capability.  We continued and completed visit with audio only.  Some vital signs may be absent or patient reported.   Time Spent with patient on telephone encounter: 25 minutes   Review of Systems     Cardiac Risk Factors include: advanced age (>49men, >65 women);hypertension;dyslipidemia     Objective:    Today's Vitals   08/26/20 1502  Weight: 136 lb (61.7 kg)  Height: 5' 3.5" (1.613 m)   Body mass index is 23.71 kg/m.  Advanced Directives 08/26/2020 05/15/2020 12/10/2018 01/09/2017 01/04/2016 02/23/2015 01/02/2015  Does Patient Have a Medical Advance Directive? Yes No No Yes Yes No No  Type of Advance Directive Healthcare Power of [place]  Does patient want to make changes to medical advance directive? - - - - No - Patient declined - -  Copy of [place] in Chart? No - copy requested - - - No - copy requested - -  Would patient like information on creating a medical advance directive? - - - - - No -  patient declined information No - patient declined information    Current Medications (verified) Outpatient Encounter Medications as of 08/26/2020  Medication Sig  . atorvastatin (LIPITOR) 80 MG tablet Take 1 tablet (80 mg total) by mouth daily.  . cholecalciferol (VITAMIN D) 1000 UNITS tablet Take 1,000 Units by mouth daily.  . clopidogrel (PLAVIX) 75 MG tablet Take 1 tablet (75 mg total) by mouth daily.  . diazepam (VALIUM) 2 MG tablet Take 1 tablet (2 mg total) by mouth 2 (two) times daily.  Marland Kitchen estradiol (ESTRACE) 0.1 MG/GM vaginal cream SMARTSIG:Topical  . famotidine (PEPCID) 40 MG tablet TAKE ONE TABLET BY MOUTH DAILY WITH LUNCH  . ferrous sulfate 325 (65 FE) MG tablet Take 325 mg by mouth daily.   . fexofenadine (ALLEGRA) 180 MG tablet Take 180 mg by mouth daily. Take 1 tablet daily for allergies  . fluticasone (FLONASE) 50 MCG/ACT nasal spray Place 2 sprays into both nostrils daily.  Marland Kitchen lisinopril (ZESTRIL) 5 MG tablet TAKE ONE TABLET BY MOUTH DAILY  . Magnesium 500 MG CAPS Take by mouth.  . Omega-3 Fatty Acids (FISH OIL) 1000 MG CAPS Take 2,000 mg by mouth daily.   . pantoprazole (PROTONIX) 40 MG tablet Take 1 tablet (40 mg total) by mouth daily.  Marland Kitchen tolterodine (DETROL LA) 4 MG 24 hr capsule Take 4 mg by mouth daily.  . nitroGLYCERIN (NITROSTAT) 0.4 MG SL tablet Place 1 tablet (0.4 mg total) under the tongue every 5 (five) minutes as needed for chest pain. (Patient not taking: Reported on 07/31/2020)  .  tiotropium (SPIRIVA HANDIHALER) 18 MCG inhalation capsule Place 1 capsule (18 mcg total) into inhaler and inhale daily. (Patient not taking: Reported on 07/31/2020)   No facility-administered encounter medications on file as of 08/26/2020.    Allergies (verified) Doxycycline, Fentanyl, Fluocinolone, Meperidine, Penicillins, Breo ellipta [fluticasone furoate-vilanterol], and Imdur [isosorbide nitrate]   History: Past Medical History:  Diagnosis Date  . Abdominal bruit 04/2017    Aortic u/s showed NO ANEURISM.  Marland Kitchen Allergy    SEASONAL  . Anxiety   . BCC (basal cell carcinoma), arm, right   . Carotid artery occlusion   . Cataract   . Colon cancer screening 05/2017   Colonoscopy reportedly normal approx 2008.  Cologuard NEG--repeat 3 yrs.  Marland Kitchen COPD (chronic obstructive pulmonary disease) ([place])    DOE, worse since 11/2018.  Stress test neg 01/2019.  Albut x 1 inh prn helpful some.  Pulm 06/2019, PFTs not diag of COPD, DOE likely from deconditioning.  . Essential hypertension, benign 05/22/2014  . GERD (gastroesophageal reflux disease)   . H/O hiatal hernia 02/2019   EGD-->moderate size->surgery recommended but pt declined.  . Hematochezia 2020/21   internal hemorrhoids  . History of iron deficiency anemia 07/2019   Dr. Bryan Lemma EGD and colonoscopy, some anorectal muscosal ulceration, o/w just mild chronic gastritis.   Also could be poor absorption due to chronic high dose acid suppression. Hb/iron normalized aft iron infus and ongoing oral iron.  . Melanoma ([place])   . Mitral valve prolapse   . Mixed hyperlipidemia 05/22/2014  . OAB (overactive bladder)    detrol LA from urol helpful  . Osteopenia 06/06/2016   05/2016 DEXA T-score -1.5.  06/2018 DEXA T score -1.1  . Peripheral vascular disease, unspecified ([place]) 05/22/2014   innominate and carotid.  U/S f/u 12/2016 showed no signif change compared to 2017--vascular recommended repeat 1 yr.  Doing well as of 08/2018 cardiol f/u-->continued on ASA and Plavix. F/u 08/2019->ASA d/c'd, plavix continued.  . Prediabetes 2018   2018 A1c 6.3%.  05/2018 A1c 6.1%. 07/2019 A1c 6.4%.  Marland Kitchen Reflux esophagitis   . Stroke [facility]) 2006   Mini  Stroke- not major  . Tobacco abuse, in remission    quit 2009   Past Surgical History:  Procedure Laterality Date  . ABDOMINAL HYSTERECTOMY  1997  . CARDIAC CATHETERIZATION  01/2012   NORMAL  . CARDIOVASCULAR STRESS TEST  02/05/2019   NORMAL.  EF normal.  . CAROTID ENDARTERECTOMY  08/21/2007     right - Dr. Amedeo Plenty  . CHOLECYSTECTOMY  2000  . COLONOSCOPY  approx 2008; 09/23/19   2008 (Dr. Lenon Ahmadi pt->normal.  08/2019 adenoma, some friable areas of colon + anorectal mucosa inflam w/ ulceration, +diverticulosis. Recall 5 yrs  . DEXA  06/06/2016   05/2016 T-score -1.5.  06/2018 T score -1.1.  Repeat 2 yrs.  . ESOPHAGOGASTRODUODENOSCOPY  03/11/2019; 09/23/19   02/2019; 5 cm hiatal hernia; GI suggested surgical repair due to intractable GER.  08/2019 mild chronic gastritis, h pylori neg, multiple benign bx's including duodenum neg for celiac changes.  . Incision and drainage of left thenar space abscess  02/23/15   s/p cat bite (Dr. Amedeo Plenty)  . innominate stent  2007   12/2014 u/s showed patent stent in innominate and L common carotid.  L prox int carotid 40-59% stenosis  . LDCT for lung ca screening  06/2019   NEG->rpt 1 yr  . Left common carotid stent  2006 and 2007   "                    "                  "                          "                             "  .  TRANSTHORACIC ECHOCARDIOGRAM  Dec 25, 2005   NORMAL  . US CAROTID DOPPLER BILATERAL ([place] [facility])  06/2019   left stent w/out stenosis.  R ICA sten 40-50% obst-stable. Rpt 1 yr.   Family History  Problem Relation Age of Onset  . Heart attack Mother   . CAD Mother   . Heart disease Mother        Before age 50  . Hypertension Mother   . Hyperlipidemia Mother   . Hypertension Father   . Pneumonia Father   . Hyperlipidemia Father   . Heart murmur Sister   . Osteoporosis Paternal Grandmother   . CAD Paternal Grandfather   . Breast cancer Neg Hx   . Colon cancer Neg Hx   . Esophageal cancer Neg Hx   . Rectal cancer Neg Hx   . Stomach cancer Neg Hx   . Colon polyps Neg Hx    Social History   Socioeconomic History  . Marital status: Widowed    Spouse name: Not on file  . Number of children: Not on file  . Years of education: Not on file  . Highest education level: Not on file  Occupational History  . Not on file   Tobacco Use  . Smoking status: Former Smoker    Types: Cigarettes    Quit date: 09/27/2007    Years since quitting: 12.9  . Smokeless tobacco: Never Used  Vaping Use  . Vaping Use: Never used  Substance and Sexual Activity  . Alcohol use: No  . Drug use: No  . Sexual activity: Not on file  Other Topics Concern  . Not on file  Social History Narrative   Widowed as of 2014/12/26 (husband died of viral illness while out of town in [place], [place]), has one daughter.   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   Tobacco: 20 pack-yr hx, quit 12/26/2007.   Alcohol: rare.   No hx of alc or drug problems.   Exercise: walks 2.66miles on treadmill daily.   Social Determinants of Health   Financial Resource Strain: Low Risk   . Difficulty of Paying Living Expenses: Not hard at all  Food Insecurity: No Food Insecurity  . Worried About Charity fundraiser in the Last Year: Never true  . Ran Out of Food in the Last Year: Never true  Transportation Needs: No Transportation Needs  . Lack of Transportation (Medical): No  . Lack of Transportation (Non-Medical): No  Physical Activity: Sufficiently Active  . Days of Exercise per Week: 7 days  . Minutes of Exercise per Session: 40 min  Stress: No Stress Concern Present  . Feeling of Stress : Not at all  Social Connections: Moderately Isolated  . Frequency of Communication with Friends and Family: More than three times a week  . Frequency of Social Gatherings with Friends and Family: More than three times a week  . Attends Religious Services: Never  . Active Member of Clubs or Organizations: Yes  . Attends Archivist Meetings: More than 4 times per year  . Marital Status: Widowed    Tobacco Counseling Counseling given: Not Answered   Clinical Intake:  Pre-visit preparation completed: Yes  Pain : No/denies pain     Nutritional Status: BMI of 19-24  Normal Nutritional Risks: None Diabetes:  No  How often do you need to have someone help you when you read instructions, pamphlets, or other written materials from your doctor or pharmacy?:  1 - Never What is the last grade level you completed in school?: 12th grade  Diabetic?No  Interpreter Needed?: No  Information entered by :: Caroleen Hamman LPN   Activities of Daily Living In your present state of health, do you have any difficulty performing the following activities: 08/26/2020  Hearing? N  Vision? N  Difficulty concentrating or making decisions? N  Walking or climbing stairs? N  Dressing or bathing? N  Doing errands, shopping? N  Preparing Food and eating ? N  Using the Toilet? N  In the past six months, have you accidently leaked urine? N  Do you have problems with loss of bowel control? N  Managing your Medications? N  Managing your Finances? N  Housekeeping or managing your Housekeeping? N  Some recent data might be hidden    Patient Care Team: Tammi Sou, MD as PCP - General (Family Medicine) Jettie Booze, MD as PCP - Cardiology (Cardiology) Jettie Booze, MD as Attending Physician (Cardiology) Serafina Mitchell, MD as Consulting Physician (Vascular Surgery) Roseanne Kaufman, MD as Consulting Physician (Orthopedic Surgery) Lavonna Monarch, MD as Consulting Physician (Dermatology) Armbruster, Carlota Raspberry, MD as Consulting Physician (Gastroenterology) Clovis Riley, MD as Consulting Physician (General Surgery) Candee Furbish, MD as Consulting Physician (Pulmonary Disease) Lavena Bullion, DO as Consulting Physician (Gastroenterology) Janith Lima, MD as Consulting Physician (Urology)  Indicate any recent Medical Services you may have received from other than [facility] providers in the past year (date may be approximate).     Assessment:   This is a routine wellness examination for [name].  Hearing/Vision screen  Hearing Screening   125Hz  250Hz  500Hz  1000Hz  2000Hz  3000Hz  4000Hz   6000Hz  8000Hz   Right ear:           Left ear:           Comments: No hearing loss  Vision Screening Comments: Occasionally wears glasses but not all the time Last eye exam-07/2020- Dr.Groat  Dietary issues and exercise activities discussed: Current Exercise Habits: Home exercise routine, Type of exercise: walking, Time (Minutes): 45, Frequency (Times/Week): 7, Weekly Exercise (Minutes/Week): 315, Intensity: Mild, Exercise limited by: None identified  Goals    . Patient Stated     Maintain or increase current level of activity & cut back on sugar intake.      Depression Screen PHQ 2/9 Scores 08/26/2020 08/01/2019 05/31/2018 11/28/2017 05/24/2017  PHQ - 2 Score 0 0 0 0 1  PHQ- 9 Score - - 0 0 -    Fall Risk Fall Risk  08/26/2020 04/21/2020 08/19/2019 05/31/2018 05/24/2017  Falls in the past year? 0 0 0 No No  Comment - Emmi Telephone Survey: data to providers prior to load Emmi Telephone Survey: data to providers prior to load - -  Number falls in past yr: 0 - - - -  Injury with Fall? 0 - - - -  Follow up Falls prevention discussed - - - -    Any stairs in or around the home? Yes  If so, are there any without handrails? Yes pt aware of fall hazard-plans to have rails added Home free of loose throw rugs in walkways, pet beds, electrical cords, etc? Yes  Adequate lighting in your home to reduce risk of falls? Yes   ASSISTIVE DEVICES UTILIZED TO PREVENT FALLS:  Life alert? No  Use of a cane, walker or w/c? No  Grab bars in the bathroom? Yes  Shower chair or bench in shower? No  Elevated toilet seat or a handicapped toilet? No   TIMED UP AND GO:  Was the test performed? No . Phone visit   Cognitive Function:No cognitive impairment noted        Immunizations Immunization History  Administered Date(s) Administered  . Fluad Quad(high Dose 65+) 06/11/2019  . Influenza, High Dose Seasonal PF 05/23/2018, 07/21/2020  . Moderna SARS-COVID-2 Vaccination 10/23/2019, 11/20/2019,  07/21/2020  . Pneumococcal Conjugate-13 05/23/2016  . Pneumococcal Polysaccharide-23 05/24/2017  . Tdap 02/22/2015  . Zoster Recombinat (Shingrix) 07/11/2018, 10/18/2018    TDAP status: Up to date   Flu Vaccine status: Up to date   Pneumococcal vaccine status: Up to date   Covid-19 vaccine status: Completed vaccines  Qualifies for Shingles Vaccine? No   Zostavax completed No   Shingrix Completed?: Yes  Screening Tests Health Maintenance  Topic Date Due  . MAMMOGRAM  09/02/2020  . COLONOSCOPY  09/22/2022  . TETANUS/TDAP  02/21/2025  . INFLUENZA VACCINE  Completed  . DEXA SCAN  Completed  . COVID-19 Vaccine  Completed  . Hepatitis C Screening  Completed  . PNA vac Low Risk Adult  Completed    Health Maintenance  There are no preventive care reminders to display for this patient.  Colorectal cancer screening: Completed Colonoscopy 09/23/2019. Repeat every 3 years   Mammogram status: Completed Bilateral 09/03/2019. Repeat every year  Scheduled for 09/03/2020  Bone Density status: Ordered 06/30/20. Pt provided with contact info and advised to call to schedule appt.  Lung Cancer Screening: (Low Dose CT Chest recommended if Age 68-80 years, 30 pack-year currently smoking OR have quit w/in 15years.) does not qualify.     Additional Screening:  Hepatitis C Screening:Completed 01/30/2020  Vision Screening: Recommended annual ophthalmology exams for early detection of glaucoma and other disorders of the eye. Is the patient up to date with their annual eye exam?  Yes  Who is the provider or what is the name of the office in which the patient attends annual eye exams? Dr.Groat   Dental Screening: Recommended annual dental exams for proper oral hygiene  Community Resource Referral / Chronic Care Management: CRR required this visit?  No   CCM required this visit?  No      Plan:     I have personally reviewed and noted the following in the patient's chart:   . Medical  and social history . Use of alcohol, tobacco or illicit drugs  . Current medications and supplements . Functional ability and status . Nutritional status . Physical activity . Advanced directives . List of other physicians . Hospitalizations, surgeries, and ER visits in previous 12 months . Vitals . Screenings to include cognitive, depression, and falls . Referrals and appointments  In addition, I have reviewed and discussed with patient certain preventive protocols, quality metrics, and best practice recommendations. A written personalized care plan for preventive services as well as general preventive health recommendations were provided to patient.   Due to this being a telephonic visit, the after visit summary with patients personalized plan was offered to patient via mail or my-chart.  Patient would like to access on my-chart.   Marta Antu, LPN   [id number]/03/[id number]  Nurse Health Advisor  Nurse Notes: None

## 2020-08-26 ENCOUNTER — Ambulatory Visit (INDEPENDENT_AMBULATORY_CARE_PROVIDER_SITE_OTHER): Payer: PPO

## 2020-08-26 VITALS — Ht 63.5 in | Wt 136.0 lb

## 2020-08-26 DIAGNOSIS — Z1231 Encounter for screening mammogram for malignant neoplasm of breast: Secondary | ICD-10-CM

## 2020-08-26 DIAGNOSIS — Z0000 Encounter for general adult medical examination without abnormal findings: Secondary | ICD-10-CM

## 2020-08-26 NOTE — Patient Instructions (Signed)
[name] , Thank you for taking time to complete your Medicare Wellness Visit. I appreciate your ongoing commitment to your health goals. Please review the following plan we discussed and let me know if I can assist you in the future.   Screening recommendations/referrals: Colonoscopy: Completed 09/23/2019-Due 09/22/2022 Mammogram: Scheduled for 09/03/20 Bone Density: Ordered  Recommended yearly ophthalmology/optometry visit for glaucoma screening and checkup Recommended yearly dental visit for hygiene and checkup  Vaccinations: Influenza vaccine: Up to date Pneumococcal vaccine: Completed vaccines Tdap vaccine: Up to date- Due-02/21/2025 Shingles vaccine: Completed vaccines  Covid-19:Completed vaccines  Advanced directives: Please bring a copy for your chart  Conditions/risks identified: See problem list  Next appointment: Follow up in one year for your annual wellness visit 09/01/2021 @ 3:00   Preventive Care 65 Years and Older, Female Preventive care refers to lifestyle choices and visits with your health care provider that can promote health and wellness. What does preventive care include?  A yearly physical exam. This is also called an annual well check.  Dental exams once or twice a year.  Routine eye exams. Ask your health care provider how often you should have your eyes checked.  Personal lifestyle choices, including:  Daily care of your teeth and gums.  Regular physical activity.  Eating a healthy diet.  Avoiding tobacco and drug use.  Limiting alcohol use.  Practicing safe sex.  Taking low-dose aspirin every day.  Taking vitamin and mineral supplements as recommended by your health care provider. What happens during an annual well check? The services and screenings done by your health care provider during your annual well check will depend on your age, overall health, lifestyle risk factors, and family history of disease. Counseling  Your health care  provider may ask you questions about your:  Alcohol use.  Tobacco use.  Drug use.  Emotional well-being.  Home and relationship well-being.  Sexual activity.  Eating habits.  History of falls.  Memory and ability to understand (cognition).  Work and work Statistician.  Reproductive health. Screening  You may have the following tests or measurements:  Height, weight, and BMI.  Blood pressure.  Lipid and cholesterol levels. These may be checked every 5 years, or more frequently if you are over 64 years old.  Skin check.  Lung cancer screening. You may have this screening every year starting at age 70 if you have a 30-pack-year history of smoking and currently smoke or have quit within the past 15 years.  Fecal occult blood test (FOBT) of the stool. You may have this test every year starting at age 50.  Flexible sigmoidoscopy or colonoscopy. You may have a sigmoidoscopy every 5 years or a colonoscopy every 10 years starting at age 55.  Hepatitis C blood test.  Hepatitis B blood test.  Sexually transmitted disease (STD) testing.  Diabetes screening. This is done by checking your blood sugar (glucose) after you have not eaten for a while (fasting). You may have this done every 1-3 years.  Bone density scan. This is done to screen for osteoporosis. You may have this done starting at age 32.  Mammogram. This may be done every 1-2 years. Talk to your health care provider about how often you should have regular mammograms. Talk with your health care provider about your test results, treatment options, and if necessary, the need for more tests. Vaccines  Your health care provider may recommend certain vaccines, such as:  Influenza vaccine. This is recommended every year.  Tetanus, diphtheria,  and acellular pertussis (Tdap, Td) vaccine. You may need a Td booster every 10 years.  Zoster vaccine. You may need this after age 40.  Pneumococcal 13-valent conjugate (PCV13)  vaccine. One dose is recommended after age 76.  Pneumococcal polysaccharide (PPSV23) vaccine. One dose is recommended after age 69. Talk to your health care provider about which screenings and vaccines you need and how often you need them. This information is not intended to replace advice given to you by your health care provider. Make sure you discuss any questions you have with your health care provider. Document Released: 10/09/2015 Document Revised: 06/01/2016 Document Reviewed: 07/14/2015 Elsevier Interactive Patient Education  2017 [employer] Prevention in the Home Falls can cause injuries. They can happen to people of all ages. There are many things you can do to make your home safe and to help prevent falls. What can I do on the outside of my home?  Regularly fix the edges of walkways and driveways and fix any cracks.  Remove anything that might make you trip as you walk through a door, such as a raised step or threshold.  Trim any bushes or trees on the path to your home.  Use bright outdoor lighting.  Clear any walking paths of anything that might make someone trip, such as rocks or tools.  Regularly check to see if handrails are loose or broken. Make sure that both sides of any steps have handrails.  Any raised decks and porches should have guardrails on the edges.  Have any leaves, snow, or ice cleared regularly.  Use sand or salt on walking paths during winter.  Clean up any spills in your garage right away. This includes oil or grease spills. What can I do in the bathroom?  Use night lights.  Install grab bars by the toilet and in the tub and shower. Do not use towel bars as grab bars.  Use non-skid mats or decals in the tub or shower.  If you need to sit down in the shower, use a plastic, non-slip stool.  Keep the floor dry. Clean up any water that spills on the floor as soon as it happens.  Remove soap buildup in the tub or shower  regularly.  Attach bath mats securely with double-sided non-slip rug tape.  Do not have throw rugs and other things on the floor that can make you trip. What can I do in the bedroom?  Use night lights.  Make sure that you have a light by your bed that is easy to reach.  Do not use any sheets or blankets that are too big for your bed. They should not hang down onto the floor.  Have a firm chair that has side arms. You can use this for support while you get dressed.  Do not have throw rugs and other things on the floor that can make you trip. What can I do in the kitchen?  Clean up any spills right away.  Avoid walking on wet floors.  Keep items that you use a lot in easy-to-reach places.  If you need to reach something above you, use a strong step stool that has a grab bar.  Keep electrical cords out of the way.  Do not use floor polish or wax that makes floors slippery. If you must use wax, use non-skid floor wax.  Do not have throw rugs and other things on the floor that can make you trip. What can I do with my  stairs?  Do not leave any items on the stairs.  Make sure that there are handrails on both sides of the stairs and use them. Fix handrails that are broken or loose. Make sure that handrails are as long as the stairways.  Check any carpeting to make sure that it is firmly attached to the stairs. Fix any carpet that is loose or worn.  Avoid having throw rugs at the top or bottom of the stairs. If you do have throw rugs, attach them to the floor with carpet tape.  Make sure that you have a light switch at the top of the stairs and the bottom of the stairs. If you do not have them, ask someone to add them for you. What else can I do to help prevent falls?  Wear shoes that:  Do not have high heels.  Have rubber bottoms.  Are comfortable and fit you well.  Are closed at the toe. Do not wear sandals.  If you use a stepladder:  Make sure that it is fully  opened. Do not climb a closed stepladder.  Make sure that both sides of the stepladder are locked into place.  Ask someone to hold it for you, if possible.  Clearly mark and make sure that you can see:  Any grab bars or handrails.  First and last steps.  Where the edge of each step is.  Use tools that help you move around (mobility aids) if they are needed. These include:  Canes.  Walkers.  Scooters.  Crutches.  Turn on the lights when you go into a dark area. Replace any light bulbs as soon as they burn out.  Set up your furniture so you have a clear path. Avoid moving your furniture around.  If any of your floors are uneven, fix them.  If there are any pets around you, be aware of where they are.  Review your medicines with your doctor. Some medicines can make you feel dizzy. This can increase your chance of falling. Ask your doctor what other things that you can do to help prevent falls. This information is not intended to replace advice given to you by your health care provider. Make sure you discuss any questions you have with your health care provider. Document Released: 07/09/2009 Document Revised: 02/18/2016 Document Reviewed: 10/17/2014 Elsevier Interactive Patient Education  2017 [employer].

## 2020-08-27 ENCOUNTER — Other Ambulatory Visit: Payer: Self-pay | Admitting: Family Medicine

## 2020-09-03 ENCOUNTER — Ambulatory Visit
Admission: RE | Admit: 2020-09-03 | Discharge: 2020-09-03 | Disposition: A | Discharge status: Home / Self Care | Payer: PPO | Source: Ambulatory Visit

## 2020-09-03 ENCOUNTER — Other Ambulatory Visit: Payer: Self-pay

## 2020-09-03 DIAGNOSIS — Z1231 Encounter for screening mammogram for malignant neoplasm of breast: Secondary | ICD-10-CM | POA: Diagnosis not present

## 2020-09-08 ENCOUNTER — Other Ambulatory Visit: Payer: Self-pay | Admitting: Family Medicine

## 2020-09-21 DIAGNOSIS — H18513 Endothelial corneal dystrophy, bilateral: Secondary | ICD-10-CM | POA: Diagnosis not present

## 2020-09-21 DIAGNOSIS — Z961 Presence of intraocular lens: Secondary | ICD-10-CM | POA: Diagnosis not present

## 2020-09-21 DIAGNOSIS — H40013 Open angle with borderline findings, low risk, bilateral: Secondary | ICD-10-CM | POA: Diagnosis not present

## 2020-09-21 DIAGNOSIS — H2512 Age-related nuclear cataract, left eye: Secondary | ICD-10-CM | POA: Diagnosis not present

## 2020-09-21 DIAGNOSIS — H43813 Vitreous degeneration, bilateral: Secondary | ICD-10-CM | POA: Diagnosis not present

## 2020-10-08 ENCOUNTER — Other Ambulatory Visit: Payer: PPO

## 2020-11-09 ENCOUNTER — Other Ambulatory Visit: Payer: Self-pay | Admitting: Internal Medicine

## 2020-11-09 ENCOUNTER — Other Ambulatory Visit: Payer: Self-pay

## 2020-11-09 ENCOUNTER — Encounter: Payer: Self-pay | Admitting: Pulmonary Disease

## 2020-11-09 ENCOUNTER — Ambulatory Visit: Payer: PPO | Admitting: Pulmonary Disease

## 2020-11-09 VITALS — BP 114/70 | HR 93 | Temp 97.7°F | Ht 64.5 in | Wt 136.5 lb

## 2020-11-09 DIAGNOSIS — R0609 Other forms of dyspnea: Secondary | ICD-10-CM

## 2020-11-09 DIAGNOSIS — J432 Centrilobular emphysema: Secondary | ICD-10-CM

## 2020-11-09 DIAGNOSIS — K449 Diaphragmatic hernia without obstruction or gangrene: Secondary | ICD-10-CM

## 2020-11-09 DIAGNOSIS — R0600 Dyspnea, unspecified: Secondary | ICD-10-CM

## 2020-11-09 DIAGNOSIS — Z87891 Personal history of nicotine dependence: Secondary | ICD-10-CM

## 2020-11-09 MED ORDER — SPIRIVA RESPIMAT 2.5 MCG/ACT IN AERS: 2.0000 | INHALATION_SPRAY | Freq: Every day | RESPIRATORY_TRACT | 11 refills | Status: DC

## 2020-11-09 MED ORDER — SPIRIVA RESPIMAT 2.5 MCG/ACT IN AERS: 2.0000 | INHALATION_SPRAY | Freq: Every day | RESPIRATORY_TRACT | 0 refills | Status: DC

## 2020-11-09 MED ORDER — ALBUTEROL SULFATE HFA 108 (90 BASE) MCG/ACT IN AERS: 2.0000 | INHALATION_SPRAY | Freq: Four times a day (QID) | RESPIRATORY_TRACT | 6 refills | Status: DC | PRN

## 2020-11-09 NOTE — Patient Instructions (Addendum)
Thank you for visiting Dr. Valeta Harms at [facility]. Today we recommend the following:  Orders Placed This Encounter  Procedures  . Ambulatory Referral for Lung Cancer Scre  . Ambulatory referral to Cardiothoracic Surgery   Samples of Spiriva Respimat and new prescription  New albuterol inhaler   Return in about 6 months (around 05/09/2021) for with APP or Dr. Valeta Harms.    Please do your part to reduce the spread of COVID-19.

## 2020-11-09 NOTE — Progress Notes (Signed)
Synopsis: Referred in Feb 2022 for DOE, former patient of Dr. Tamala Julian, PCP: by Tammi Sou, MD  Subjective:   PATIENT ID: [name] GENDER: female DOB: [birth year], MRN: [id number]  Chief Complaint  Patient presents with  . Consult    Establish care.   Former pt of Dr. Tamala Julian.  She started last week with DOE after working in the yard.      This 70 year old female, former smoker, history of gastroesophageal reflux, moderate hiatal hernia, cough, stroke, emphysema on lung cancer screening CT however no obstruction on PFTs.  Patient presents today with ongoing shortness of breath.  Was seen by Dr. Tamala Julian in 2020.  Patient was not interested at the time of having hiatal hernia evaluated due to concern for an open repair.  She did meet with general surgery at one point in time.  She follows with Dr. Loletha Grayer from gastroenterology.   Past Medical History:  Diagnosis Date  . Abdominal bruit 04/2017   Aortic u/s showed NO ANEURISM.  Marland Kitchen Allergy    SEASONAL  . Anxiety   . BCC (basal cell carcinoma), arm, right   . Carotid artery occlusion   . Cataract   . Colon cancer screening 05/2017   Colonoscopy reportedly normal approx 2008.  Cologuard NEG--repeat 3 yrs.  Marland Kitchen COPD (chronic obstructive pulmonary disease) ([place])    DOE, worse since 11/2018.  Stress test neg 01/2019.  Albut x 1 inh prn helpful some.  Pulm 06/2019, PFTs not diag of COPD, DOE likely from deconditioning.  . Essential hypertension, benign 05/22/2014  . GERD (gastroesophageal reflux disease)   . H/O hiatal hernia 02/2019   EGD-->moderate size->surgery recommended but pt declined.  . Hematochezia 2020/21   internal hemorrhoids  . History of iron deficiency anemia 07/2019   Dr. Bryan Lemma EGD and colonoscopy, some anorectal muscosal ulceration, o/w just mild chronic gastritis.   Also could be poor absorption due to chronic high dose acid suppression. Hb/iron normalized aft iron infus and ongoing oral iron.  . Melanoma ([place])   .  Mitral valve prolapse   . Mixed hyperlipidemia 05/22/2014  . OAB (overactive bladder)    detrol LA from urol helpful  . Osteopenia 06/06/2016   05/2016 DEXA T-score -1.5.  06/2018 DEXA T score -1.1  . Peripheral vascular disease, unspecified ([employer]) 05/22/2014   innominate and carotid.  U/S f/u 12/2016 showed no signif change compared to 2017--vascular recommended repeat 1 yr.  Doing well as of 08/2018 cardiol f/u-->continued on ASA and Plavix. F/u 08/2019->ASA d/c'd, plavix continued.  . Prediabetes 2018   2018 A1c 6.3%.  05/2018 A1c 6.1%. 07/2019 A1c 6.4%.  Marland Kitchen Reflux esophagitis   . Stroke [facility]) 2006   Mini  Stroke- not major  . Tobacco abuse, in remission    quit 2009     Family History  Problem Relation Age of Onset  . Heart attack Mother   . CAD Mother   . Heart disease Mother        Before age 29  . Hypertension Mother   . Hyperlipidemia Mother   . Hypertension Father   . Pneumonia Father   . Hyperlipidemia Father   . Heart murmur Sister   . Osteoporosis Paternal Grandmother   . CAD Paternal Grandfather   . Breast cancer Neg Hx   . Colon cancer Neg Hx   . Esophageal cancer Neg Hx   . Rectal cancer Neg Hx   . Stomach cancer Neg Hx   . Colon polyps  Neg Hx      Past Surgical History:  Procedure Laterality Date  . ABDOMINAL HYSTERECTOMY  1997  . CARDIAC CATHETERIZATION  01/2012   NORMAL  . CARDIOVASCULAR STRESS TEST  02/05/2019   NORMAL.  EF normal.  . CAROTID ENDARTERECTOMY  08/21/2007   right - Dr. Amedeo Plenty  . CHOLECYSTECTOMY  2000  . COLONOSCOPY  approx 01/07/07; 09/23/19   2007-01-07 (Dr. Lenon Ahmadi pt->normal.  08/2019 adenoma, some friable areas of colon + anorectal mucosa inflam w/ ulceration, +diverticulosis. Recall 5 yrs  . DEXA  06/06/2016   05/2016 T-score -1.5.  06/2018 T score -1.1.  Repeat 2 yrs.  . ESOPHAGOGASTRODUODENOSCOPY  03/11/2019; 09/23/19   02/2019; 5 cm hiatal hernia; GI suggested surgical repair due to intractable GER.  08/2019 mild chronic gastritis, h  pylori neg, multiple benign bx's including duodenum neg for celiac changes.  . Incision and drainage of left thenar space abscess  02/23/15   s/p cat bite (Dr. Amedeo Plenty)  . innominate stent  2007   12/2014 u/s showed patent stent in innominate and L common carotid.  L prox int carotid 40-59% stenosis  . LDCT for lung ca screening  06/2019   NEG->rpt 1 yr  . Left common carotid stent  01/06/2005 and 2006/01/06   "                    "                  "                          "                             "  . TRANSTHORACIC ECHOCARDIOGRAM  January 06, 2006   NORMAL  . US CAROTID DOPPLER BILATERAL ([place] [facility])  06/2019   left stent w/out stenosis.  R ICA sten 40-50% obst-stable. Rpt 1 yr.    Social History   Socioeconomic History  . Marital status: Widowed    Spouse name: Not on file  . Number of children: Not on file  . Years of education: Not on file  . Highest education level: Not on file  Occupational History  . Not on file  Tobacco Use  . Smoking status: Former Smoker    Types: Cigarettes    Quit date: 09/27/2007    Years since quitting: 13.1  . Smokeless tobacco: Never Used  Vaping Use  . Vaping Use: Never used  Substance and Sexual Activity  . Alcohol use: No  . Drug use: No  . Sexual activity: Not on file  Other Topics Concern  . Not on file  Social History Narrative   Widowed as of 01-07-15 (husband died of viral illness while out of town in [place], [place]), has one daughter.   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   Tobacco: 20 pack-yr hx, quit 01-07-2008.   Alcohol: rare.   No hx of alc or drug problems.   Exercise: walks 2.72miles on treadmill daily.   Social Determinants of Health   Financial Resource Strain: Low Risk   . Difficulty of Paying Living Expenses: Not hard at all  Food Insecurity: No Food Insecurity  . Worried About Charity fundraiser in the Last Year: Never true  . Ran Out of Food in the Last Year:  Never true  Transportation Needs: No  Transportation Needs  . Lack of Transportation (Medical): No  . Lack of Transportation (Non-Medical): No  Physical Activity: Sufficiently Active  . Days of Exercise per Week: 7 days  . Minutes of Exercise per Session: 40 min  Stress: No Stress Concern Present  . Feeling of Stress : Not at all  Social Connections: Moderately Isolated  . Frequency of Communication with Friends and Family: More than three times a week  . Frequency of Social Gatherings with Friends and Family: More than three times a week  . Attends Religious Services: Never  . Active Member of Clubs or Organizations: Yes  . Attends Archivist Meetings: More than 4 times per year  . Marital Status: Widowed  Intimate Partner Violence: Not At Risk  . Fear of Current or Ex-Partner: No  . Emotionally Abused: No  . Physically Abused: No  . Sexually Abused: No     Allergies  Allergen Reactions  . Doxycycline Shortness Of Breath and Swelling    Lips swelling and short of breath  . Fentanyl Nausea And Vomiting    Pt states all pain medications cause N/V for her Pt states all pain medications cause N/V for her  . Fluocinolone Nausea And Vomiting  . Meperidine Nausea And Vomiting  . Penicillins Hives  . Breo Ellipta [Fluticasone Furoate-Vilanterol] Other (See Comments)    Lower extremity weakness, lightheadedness   . Imdur [Isosorbide Nitrate] Nausea And Vomiting     Outpatient Medications Prior to Visit  Medication Sig Dispense Refill  . atorvastatin (LIPITOR) 80 MG tablet TAKE ONE TABLET BY MOUTH DAILY 30 tablet 5  . cholecalciferol (VITAMIN D) 1000 UNITS tablet Take 1,000 Units by mouth daily.    . clopidogrel (PLAVIX) 75 MG tablet TAKE ONE TABLET BY MOUTH DAILY 90 tablet 1  . diazepam (VALIUM) 2 MG tablet Take 1 tablet (2 mg total) by mouth 2 (two) times daily. 60 tablet 5  . estradiol (ESTRACE) 0.1 MG/GM vaginal cream SMARTSIG:Topical    . famotidine (PEPCID) 40 MG tablet TAKE ONE TABLET BY MOUTH DAILY  WITH LUNCH 90 tablet 1  . ferrous sulfate 325 (65 FE) MG tablet Take 325 mg by mouth daily.     . fexofenadine (ALLEGRA) 180 MG tablet Take 180 mg by mouth daily. Take 1 tablet daily for allergies    . fluticasone (FLONASE) 50 MCG/ACT nasal spray Place 2 sprays into both nostrils daily. 16 g 6  . lisinopril (ZESTRIL) 5 MG tablet TAKE ONE TABLET BY MOUTH DAILY 90 tablet 1  . Magnesium 500 MG CAPS Take by mouth.    . nitroGLYCERIN (NITROSTAT) 0.4 MG SL tablet Place 1 tablet (0.4 mg total) under the tongue every 5 (five) minutes as needed for chest pain. 25 tablet 6  . Omega-3 Fatty Acids (FISH OIL) 1000 MG CAPS Take 2,000 mg by mouth daily.     . pantoprazole (PROTONIX) 40 MG tablet TAKE ONE TABLET BY MOUTH TWICE DAILY 180 tablet 1  . tolterodine (DETROL LA) 4 MG 24 hr capsule Take 4 mg by mouth daily.    Marland Kitchen tiotropium (SPIRIVA HANDIHALER) 18 MCG inhalation capsule Place 1 capsule (18 mcg total) into inhaler and inhale daily. (Patient not taking: Reported on 07/31/2020) 30 capsule 11   No facility-administered medications prior to visit.    Review of Systems  Constitutional: Negative for chills, fever, malaise/fatigue and weight loss.  HENT: Negative for hearing loss, sore throat and tinnitus.   Eyes:  Negative for blurred vision and double vision.  Respiratory: Positive for cough and shortness of breath. Negative for hemoptysis, sputum production, wheezing and stridor.   Cardiovascular: Negative for chest pain, palpitations, orthopnea, leg swelling and PND.  Gastrointestinal: Positive for heartburn. Negative for abdominal pain, constipation, diarrhea, nausea and vomiting.  Genitourinary: Negative for dysuria, hematuria and urgency.  Musculoskeletal: Negative for joint pain and myalgias.  Skin: Negative for itching and rash.  Neurological: Negative for dizziness, tingling, weakness and headaches.  Endo/Heme/Allergies: Negative for environmental allergies. Does not bruise/bleed easily.   Psychiatric/Behavioral: Negative for depression. The patient is not nervous/anxious and does not have insomnia.   All other systems reviewed and are negative.    Objective:  Physical Exam Vitals reviewed.  Constitutional:      General: She is not in acute distress.    Appearance: She is well-developed and well-nourished.  HENT:     Head: Normocephalic and atraumatic.     Mouth/Throat:     Mouth: Oropharynx is clear and moist.  Eyes:     General: No scleral icterus.    Conjunctiva/sclera: Conjunctivae normal.     Pupils: Pupils are equal, round, and reactive to light.  Neck:     Vascular: No JVD.     Trachea: No tracheal deviation.  Cardiovascular:     Rate and Rhythm: Normal rate and regular rhythm.     Pulses: Intact distal pulses.     Heart sounds: Normal heart sounds. No murmur heard.   Pulmonary:     Effort: Pulmonary effort is normal. No tachypnea, accessory muscle usage or respiratory distress.     Breath sounds: Normal breath sounds. No stridor. No wheezing, rhonchi or rales.  Abdominal:     General: Bowel sounds are normal. There is no distension.     Palpations: Abdomen is soft.     Tenderness: There is no abdominal tenderness.  Musculoskeletal:        General: No tenderness or edema.     Cervical back: Neck supple.  Lymphadenopathy:     Cervical: No cervical adenopathy.  Skin:    General: Skin is warm and dry.     Capillary Refill: Capillary refill takes less than 2 seconds.     Findings: No rash.  Neurological:     Mental Status: She is alert and oriented to person, place, and time.  Psychiatric:        Mood and Affect: Mood and affect normal.        Behavior: Behavior normal.      Vitals:   11/09/20 1605  BP: 114/70  Pulse: 93  Temp: 97.7 F (36.5 C)  TempSrc: Tympanic  SpO2: 97%  Weight: 136 lb 8 oz (61.9 kg)  Height: 5' 4.5" (1.638 m)   97% on RA BMI Readings from Last 3 Encounters:  11/09/20 23.07 kg/m  08/26/20 23.71 kg/m   07/31/20 23.75 kg/m   Wt Readings from Last 3 Encounters:  11/09/20 136 lb 8 oz (61.9 kg)  08/26/20 136 lb (61.7 kg)  07/31/20 136 lb 3.2 oz (61.8 kg)     CBC    Component Value Date/Time   WBC 7.5 07/31/2020 0929   RBC 5.12 (H) 07/31/2020 0929   HGB 16.0 (H) 07/31/2020 0929   HCT 47.9 (H) 07/31/2020 0929   PLT 350.0 07/31/2020 0929   MCV 93.5 07/31/2020 0929   MCH 30.9 05/15/2020 1256   MCHC 33.4 07/31/2020 0929   RDW 13.3 07/31/2020 0929   LYMPHSABS 2.8 07/31/2020  0929   MONOABS 0.5 07/31/2020 0929   EOSABS 0.2 07/31/2020 0929   BASOSABS 0.1 07/31/2020 0929    Chest Imaging:  LDCT 2020: IMPRESSION: 1. Lung-RADS 1, negative. Continue annual screening with low-dose chest CT without contrast in 12 months. 2. Moderate hiatal hernia. 3. Aortic atherosclerosis (ICD10-170.0). Coronary artery calcification. 4.  Emphysema (ICD10-J43.9).    Pulmonary Functions Testing Results: PFT Results Latest Ref Rng & Units 07/05/2019  FVC-Pre L 2.43  FVC-Predicted Pre % 80  FVC-Post L 2.53  FVC-Predicted Post % 84  Pre FEV1/FVC % % 74  Post FEV1/FCV % % 76  FEV1-Pre L 1.80  FEV1-Predicted Pre % 78  FEV1-Post L 1.91  DLCO uncorrected ml/min/mmHg 15.39  DLCO UNC% % 79  DLVA Predicted % 84  TLC L 4.91  TLC % Predicted % 98  RV % Predicted % 115    FeNO:   Pathology:   Echocardiogram:   Heart Catheterization:     Assessment & Plan:     ICD-10-CM   1. DOE (dyspnea on exertion)  R06.00   2. Personal history of smoking  Z87.891 Ambulatory Referral for Lung Cancer Scre  3. Hiatal hernia  K44.9 Ambulatory referral to Cardiothoracic Surgery  4. Centrilobular emphysema ([place])  J43.2     Discussion:  This is a 70 year old female, former smoker quit 2009.  Had a lung cancer screening CT with a lung RADS 2 in 2020.  She did not have a repeat annual.  Would recommend her to enroll in our lung cancer screening program.  She also has a moderate hiatal hernia, chronic reflux,  chronic indigestion feeling as well as shortness of breath which she attributes after eating large meals.  Plan: Referral to cardiothoracic surgery for evaluation of hiatal hernia and consideration for repair. She does have evidence of emphysema on her previous CT but spirometry was reassuring on previous pulmonary function test. Give her samples of Spiriva Respimat with a new prescription today. Samples of albuterol to be used as needed for shortness of breath and wheezing.   Current Outpatient Medications:  .  atorvastatin (LIPITOR) 80 MG tablet, TAKE ONE TABLET BY MOUTH DAILY, Disp: 30 tablet, Rfl: 5 .  cholecalciferol (VITAMIN D) 1000 UNITS tablet, Take 1,000 Units by mouth daily., Disp: , Rfl:  .  clopidogrel (PLAVIX) 75 MG tablet, TAKE ONE TABLET BY MOUTH DAILY, Disp: 90 tablet, Rfl: 1 .  diazepam (VALIUM) 2 MG tablet, Take 1 tablet (2 mg total) by mouth 2 (two) times daily., Disp: 60 tablet, Rfl: 5 .  estradiol (ESTRACE) 0.1 MG/GM vaginal cream, SMARTSIG:Topical, Disp: , Rfl:  .  famotidine (PEPCID) 40 MG tablet, TAKE ONE TABLET BY MOUTH DAILY WITH LUNCH, Disp: 90 tablet, Rfl: 1 .  ferrous sulfate 325 (65 FE) MG tablet, Take 325 mg by mouth daily. , Disp: , Rfl:  .  fexofenadine (ALLEGRA) 180 MG tablet, Take 180 mg by mouth daily. Take 1 tablet daily for allergies, Disp: , Rfl:  .  fluticasone (FLONASE) 50 MCG/ACT nasal spray, Place 2 sprays into both nostrils daily., Disp: 16 g, Rfl: 6 .  lisinopril (ZESTRIL) 5 MG tablet, TAKE ONE TABLET BY MOUTH DAILY, Disp: 90 tablet, Rfl: 1 .  Magnesium 500 MG CAPS, Take by mouth., Disp: , Rfl:  .  nitroGLYCERIN (NITROSTAT) 0.4 MG SL tablet, Place 1 tablet (0.4 mg total) under the tongue every 5 (five) minutes as needed for chest pain., Disp: 25 tablet, Rfl: 6 .  Omega-3 Fatty Acids (FISH OIL)  1000 MG CAPS, Take 2,000 mg by mouth daily. , Disp: , Rfl:  .  pantoprazole (PROTONIX) 40 MG tablet, TAKE ONE TABLET BY MOUTH TWICE DAILY, Disp: 180 tablet,  Rfl: 1 .  tolterodine (DETROL LA) 4 MG 24 hr capsule, Take 4 mg by mouth daily., Disp: , Rfl:  .  tiotropium (SPIRIVA HANDIHALER) 18 MCG inhalation capsule, Place 1 capsule (18 mcg total) into inhaler and inhale daily. (Patient not taking: Reported on 07/31/2020), Disp: 30 capsule, Rfl: 11  I spent 30 minutes dedicated to the care of this patient on the date of this encounter to include pre-visit review of records, face-to-face time with the patient discussing conditions above, post visit ordering of testing, clinical documentation with the electronic health record, making appropriate referrals as documented, and communicating necessary findings to members of the patients care team.   Garner Nash, DO [facility] 11/09/2020 4:10 PM

## 2020-11-09 NOTE — Addendum Note (Signed)
Addended by: Elie Confer on: 11/09/2020 04:43 PM   Modules accepted: Orders

## 2020-11-09 NOTE — Addendum Note (Signed)
Addended by: Elie Confer on: 11/09/2020 04:42 PM   Modules accepted: Orders

## 2020-11-16 NOTE — Telephone Encounter (Signed)
Please advise on patient mychart message  I used the new inhaler last Tuesday. Today is first day without hoarseness and cough. I only used that one day because of  bad cough. Is this a normal reaction and should I still continue to use? I have been taking some Mucinex to try to relieve some of the tightness in chest.it has worked. Thank you!!

## 2020-11-23 ENCOUNTER — Other Ambulatory Visit: Payer: Self-pay | Admitting: *Deleted

## 2020-11-23 DIAGNOSIS — Z87891 Personal history of nicotine dependence: Secondary | ICD-10-CM

## 2020-11-26 ENCOUNTER — Other Ambulatory Visit: Payer: PPO

## 2020-12-04 ENCOUNTER — Other Ambulatory Visit: Payer: Self-pay

## 2020-12-04 ENCOUNTER — Encounter: Payer: Self-pay | Admitting: Thoracic Surgery (Cardiothoracic Vascular Surgery)

## 2020-12-04 ENCOUNTER — Institutional Professional Consult (permissible substitution): Payer: PPO | Admitting: Thoracic Surgery (Cardiothoracic Vascular Surgery)

## 2020-12-04 VITALS — BP 122/78 | HR 90 | Temp 97.6°F | Resp 20 | Ht 64.0 in | Wt 135.0 lb

## 2020-12-04 DIAGNOSIS — K449 Diaphragmatic hernia without obstruction or gangrene: Secondary | ICD-10-CM

## 2020-12-04 NOTE — Progress Notes (Signed)
[name]       [place] [address]             [phone]                    [name] Medical Record #[id number] Date of Birth: [birth year]  Referring: Laura Nash, DO Primary Care: Laura Sou, MD Primary Cardiologist: Laura Grooms, MD  Chief Complaint:    Chief Complaint  Patient presents with  . Hiatal Hernia    Surgical consult    History of Present Illness:    [name] 70 y.o. female referred by [name] for surgical evaluation of a moderate size hiatal hernia.  The patient is very symptomatic from an aerodigestive standpoint.  She states that she is short of breath after meals, and is only able to tolerate small portions at this point.  She occasionally has some dysphagia.  She has significant reflux and is unable to come off of any of her medications.  She states that occasionally when she leans forward she also has some regurgitant gastric content.  She has been awoken from sleep on several occasions with a feeling of choking thus at this point she no longer eats after 5 PM.  She denies any odynophagia.  Her weight has been stable.  In regards to her shortness of breath she is concerned that this may be related to her COPD but does note that even after drinking fluids she is more short of breath.    Past Medical History:  Diagnosis Date  . Abdominal bruit 04/2017   Aortic u/s showed NO ANEURISM.  Laura Kitchen Allergy    SEASONAL  . Anxiety   . BCC (basal cell carcinoma), arm, right   . Carotid artery occlusion   . Cataract   . Colon cancer screening 05/2017   Colonoscopy reportedly normal approx 2008.  Cologuard NEG--repeat 3 yrs.  Laura Kitchen COPD (chronic obstructive pulmonary disease) ([place])    DOE, worse since 11/2018.  Stress test neg 01/2019.  Albut x 1 inh prn helpful some.  Pulm 06/2019, PFTs not diag of COPD, DOE likely from deconditioning.  . Essential hypertension, benign 05/22/2014  . GERD (gastroesophageal  reflux disease)   . H/O hiatal hernia 02/2019   EGD-->moderate size->surgery recommended but pt declined.  . Hematochezia 2020/21   internal hemorrhoids  . History of iron deficiency anemia 07/2019   Dr. Bryan [name] EGD and colonoscopy, some anorectal muscosal ulceration, o/w just mild chronic gastritis.   Also could be poor absorption due to chronic high dose acid suppression. Hb/iron normalized aft iron infus and ongoing oral iron.  . Melanoma ([place])   . Mitral valve prolapse   . Mixed hyperlipidemia 05/22/2014  . OAB (overactive bladder)    detrol LA from urol helpful  . Osteopenia 06/06/2016   05/2016 DEXA T-score -1.5.  06/2018 DEXA T score -1.1  . Peripheral vascular disease, unspecified ([place]) 05/22/2014   innominate and carotid.  U/S f/u 12/2016 showed no signif change compared to 2017--vascular recommended repeat 1 yr.  Doing well as of 08/2018 cardiol f/u-->continued on ASA and Plavix. F/u 08/2019->ASA d/c'd, plavix continued.  . Prediabetes 2018   2018 A1c 6.3%.  05/2018 A1c 6.1%. 07/2019 A1c 6.4%.  Laura Kitchen Reflux esophagitis   . Stroke [facility]) 2006   Mini  Stroke- not major  . Tobacco abuse, in remission    quit 2009    Past Surgical History:  Procedure Laterality Date  . ABDOMINAL HYSTERECTOMY  1997  . CARDIAC CATHETERIZATION  01/2012   NORMAL  . CARDIOVASCULAR STRESS TEST  02/05/2019   NORMAL.  EF normal.  . CAROTID ENDARTERECTOMY  08/21/2007   right - Dr. Amedeo [name]  . CHOLECYSTECTOMY  2000  . COLONOSCOPY  approx 2008; 09/23/19   2008 (Dr. Lenon [name] pt->normal.  08/2019 adenoma, some friable areas of colon + anorectal mucosa inflam w/ ulceration, +diverticulosis. Recall 5 yrs  . DEXA  06/06/2016   05/2016 T-score -1.5.  06/2018 T score -1.1.  Repeat 2 yrs.  . ESOPHAGOGASTRODUODENOSCOPY  03/11/2019; 09/23/19   02/2019; 5 cm hiatal hernia; GI suggested surgical repair due to intractable GER.  08/2019 mild chronic gastritis, h pylori neg, multiple benign bx's including duodenum neg  for celiac changes.  . Incision and drainage of left thenar space abscess  02/23/15   s/p cat bite (Dr. Amedeo [name])  . innominate stent  2007   12/2014 u/s showed patent stent in innominate and L common carotid.  L prox int carotid 40-59% stenosis  . LDCT for lung ca screening  06/2019   NEG->rpt 1 yr  . Left common carotid stent  2006 and 2007   "                    "                  "                          "                             "  . TRANSTHORACIC ECHOCARDIOGRAM  2007   NORMAL  . US CAROTID DOPPLER BILATERAL ([place] HX)  06/2019   left stent w/out stenosis.  R ICA sten 40-50% obst-stable. Rpt 1 yr.    Family History  Problem Relation Age of Onset  . Heart attack Mother   . CAD Mother   . Heart disease Mother        Before age 42  . Hypertension Mother   . Hyperlipidemia Mother   . Hypertension Father   . Pneumonia Father   . Hyperlipidemia Father   . Heart murmur Sister   . Osteoporosis Paternal Grandmother   . CAD Paternal Grandfather   . Breast cancer Neg Hx   . Colon cancer Neg Hx   . Esophageal cancer Neg Hx   . Rectal cancer Neg Hx   . Stomach cancer Neg Hx   . Colon polyps Neg Hx      Social History   Tobacco Use  Smoking Status Former Smoker  . Types: Cigarettes  . Quit date: 09/27/2007  . Years since quitting: 13.1  Smokeless Tobacco Never Used    Social History   Substance and Sexual Activity  Alcohol Use No     Allergies  Allergen Reactions  . Doxycycline Shortness Of Breath and Swelling    Lips swelling and short of breath  . Fentanyl Nausea And Vomiting    Pt states all pain medications cause N/V for her Pt states all pain medications cause N/V for her  . Fluocinolone Nausea And Vomiting  . Meperidine Nausea And Vomiting  . Penicillins Hives  . Breo Ellipta [Fluticasone Furoate-Vilanterol] Other (See Comments)    Lower extremity weakness, lightheadedness   . Imdur [Isosorbide Nitrate] Nausea And  Vomiting    Current Outpatient  Medications  Medication Sig Dispense Refill  . albuterol (VENTOLIN HFA) 108 (90 Base) MCG/ACT inhaler Inhale 2 puffs into the lungs every 6 (six) hours as needed for wheezing or shortness of breath. 8 g 6  . atorvastatin (LIPITOR) 80 MG tablet TAKE ONE TABLET BY MOUTH DAILY 30 tablet 5  . cholecalciferol (VITAMIN D) 1000 UNITS tablet Take 1,000 Units by mouth daily.    . clopidogrel (PLAVIX) 75 MG tablet TAKE ONE TABLET BY MOUTH DAILY 90 tablet 1  . diazepam (VALIUM) 2 MG tablet Take 1 tablet (2 mg total) by mouth 2 (two) times daily. 60 tablet 5  . estradiol (ESTRACE) 0.1 MG/GM vaginal cream SMARTSIG:Topical    . famotidine (PEPCID) 40 MG tablet TAKE ONE TABLET BY MOUTH DAILY WITH LUNCH 90 tablet 1  . ferrous sulfate 325 (65 FE) MG tablet Take 325 mg by mouth daily.     . fexofenadine (ALLEGRA) 180 MG tablet Take 180 mg by mouth daily. Take 1 tablet daily for allergies    . fluticasone (FLONASE) 50 MCG/ACT nasal spray Place 2 sprays into both nostrils daily. 16 g 6  . lisinopril (ZESTRIL) 5 MG tablet TAKE ONE TABLET BY MOUTH DAILY 90 tablet 1  . Magnesium 500 MG CAPS Take by mouth.    . nitroGLYCERIN (NITROSTAT) 0.4 MG SL tablet Place 1 tablet (0.4 mg total) under the tongue every 5 (five) minutes as needed for chest pain. 25 tablet 6  . Omega-3 Fatty Acids (FISH OIL) 1000 MG CAPS Take 2,000 mg by mouth daily.     . pantoprazole (PROTONIX) 40 MG tablet TAKE ONE TABLET BY MOUTH TWICE DAILY 180 tablet 1  . tiotropium (SPIRIVA HANDIHALER) 18 MCG inhalation capsule Place 1 capsule (18 mcg total) into inhaler and inhale daily. (Patient not taking: Reported on 07/31/2020) 30 capsule 11  . Tiotropium Bromide Monohydrate (SPIRIVA RESPIMAT) 2.5 MCG/ACT AERS Inhale 2 puffs into the lungs daily. 4 g 11  . Tiotropium Bromide Monohydrate (SPIRIVA RESPIMAT) 2.5 MCG/ACT AERS Inhale 2 puffs into the lungs daily. 8 g 0  . tolterodine (DETROL LA) 4 MG 24 hr capsule Take 4 mg by mouth daily.     No current  facility-administered medications for this visit.    Review of Systems  Constitutional: Negative for malaise/fatigue and weight loss.  Respiratory: Positive for cough, sputum production and shortness of breath.   Cardiovascular: Negative for chest pain.  Gastrointestinal: Positive for abdominal pain, heartburn and nausea.  Musculoskeletal: Negative.   Neurological: Negative.     PHYSICAL EXAMINATION: BP 122/78   Pulse 90   Temp 97.6 F (36.4 C) (Skin)   Resp 20   Ht 5\' 4"  (1.626 m)   Wt 135 lb (61.2 kg)   SpO2 95% Comment: RA  BMI 23.17 kg/m   Physical Exam Constitutional:      General: She is not in acute distress.    Appearance: Normal appearance. She is normal weight. She is not ill-appearing.  HENT:     Head: Normocephalic and atraumatic.  Eyes:     Extraocular Movements: Extraocular movements intact.  Cardiovascular:     Rate and Rhythm: Normal rate.  Pulmonary:     Effort: Pulmonary effort is normal. No respiratory distress.  Abdominal:     General: Abdomen is flat. There is no distension.  Musculoskeletal:     Cervical back: Normal range of motion.  Skin:    General: Skin is warm and dry.  Neurological:     General: No focal deficit present.     Mental Status: She is alert and oriented to person, place, and time.      Diagnostic Studies & Laboratory data:     Recent Radiology Findings:   No results found.     I have independently reviewed the above radiology studies  and reviewed the findings with the patient.   Recent Lab Findings: Lab Results  Component Value Date   WBC 7.5 07/31/2020   HGB 16.0 (H) 07/31/2020   HCT 47.9 (H) 07/31/2020   PLT 350.0 07/31/2020   GLUCOSE 83 07/31/2020   CHOL 173 07/31/2020   TRIG 172.0 (H) 07/31/2020   HDL 55.60 07/31/2020   LDLDIRECT 74.0 11/28/2017   LDLCALC 83 07/31/2020   ALT 34 07/31/2020   AST 27 07/31/2020   NA 138 07/31/2020   K 4.1 07/31/2020   CL 98 07/31/2020   CREATININE 0.75 07/31/2020    BUN 14 07/31/2020   CO2 31 07/31/2020   TSH 0.84 07/31/2020   INR 0.9 08/17/2007   HGBA1C 6.0 07/31/2020     Assessment / Plan:   70 year old female with a moderate size hiatal hernia.  I reviewed her CT scan from 2020, and she has a good portion of stomach sitting behind her heart and her posterior mediastinum.  She also has some element of COPD, but based on her symptoms I question whether or not her respiratory symptoms is due to her hiatal hernia, and potential silent aspiration.  We discussed the risks and benefits of an EGD followed by a robotic assisted hiatal hernia repair with fundoplication.  Given her degree of reflux I do think that she should at least get a partial wrap.  She is interested in surgery but would like some time to think about it.  If she agrees to undergo this operation then she will need a formal CT chest.  She is currently on Plavix for a subclavian stent, and she is followed by Dr. Irish Lack who she is scheduled to see next week for her regular cardiology appointment.      [name] 12/04/2020 4:40 PM

## 2020-12-06 NOTE — Progress Notes (Unsigned)
Cardiology Office Note   Date:  12/07/2020   ID:  [name], DOB 09/18/1951, MRN [id number]  PCP:  Tammi Sou, MD    No chief complaint on file.  PAD  Wt Readings from Last 3 Encounters:  12/07/20 134 lb (60.8 kg)  12/04/20 135 lb (61.2 kg)  11/09/20 136 lb 8 oz (61.9 kg)       History of Present Illness: [name] is a 70 y.o. female  with a family history of significant coronary artery disease. She has a history of early vascular disease and difficult to control hyperlipidemia. She has had significant carotid disease in the past with stents placed in therightinnominate artery 2007)and leftcommon carotid artery (2006).Right CEA in 2008.  She had a cardiac cath in 2013 showing no significant coronary artery disease and only minimal coronary calcification.   Her husband passed away in 09-28-15. He was at work in [place] when he died from a viral illness. It was very quick.  Occasional BP spikes at the MDs office.   She works across the street from the [employer]. She worked during the womens and mens [place] in 2020.  She had a viral infection in 11/2018. She had a chest xray. She was treated with antibiotics. Since then, she has had more DOE. SHe has not been able to get back to her usual self. She gets some DOE with mowing the lawn.  Stress test in 01/2019:  Nuclear stress EF: 56%.  The left ventricular ejection fraction is normal (55-65%).  No T wave inversion was noted during stress.  There was no ST segment deviation noted during stress.  The study is normal.  This is a low risk study.  Normal perfusion. LVEF 56% with normal wall motion. This is a low risk study.  She is getting back to walking.  In 2020, she retired. Her iron was low. She has EGD and colonoscopy are planned on 12/28, with enteroscopy. No bleeding identified.  She still takes iron.   She has received her COVID vaccines.    Mild DOE- chronic.  Known mild emphysema.  Had dental work in 5/21.  Has large hiatal hernia and may need an operation to treat sx.  Seen by Dr. Kipp Brood.   SHe had persistent SHOB, with walkng up stairs and some with walking on the flat ground.     Past Medical History:  Diagnosis Date  . Abdominal bruit 04/2017   Aortic u/s showed NO ANEURISM.  Marland Kitchen Allergy    SEASONAL  . Anxiety   . BCC (basal cell carcinoma), arm, right   . Carotid artery occlusion   . Cataract   . Colon cancer screening 05/2017   Colonoscopy reportedly normal approx 2008.  Cologuard NEG--repeat 3 yrs.  Marland Kitchen COPD (chronic obstructive pulmonary disease) ([place])    DOE, worse since 11/2018.  Stress test neg 01/2019.  Albut x 1 inh prn helpful some.  Pulm 06/2019, PFTs not diag of COPD, DOE likely from deconditioning.  . Essential hypertension, benign 05/22/2014  . GERD (gastroesophageal reflux disease)   . H/O hiatal hernia 02/2019   EGD-->moderate size->surgery recommended but pt declined.  . Hematochezia 2020/21   internal hemorrhoids  . History of iron deficiency anemia 07/2019   Dr. Bryan Lemma EGD and colonoscopy, some anorectal muscosal ulceration, o/w just mild chronic gastritis.   Also could be poor absorption due to chronic high dose acid suppression. Hb/iron normalized aft iron infus and ongoing oral  iron.  . Melanoma ([place])   . Mitral valve prolapse   . Mixed hyperlipidemia 05/22/2014  . OAB (overactive bladder)    detrol LA from urol helpful  . Osteopenia 06/06/2016   05/2016 DEXA T-score -1.5.  06/2018 DEXA T score -1.1  . Peripheral vascular disease, unspecified ([place]) 05/22/2014   innominate and carotid.  U/S f/u 12/2016 showed no signif change compared to 2017--vascular recommended repeat 1 yr.  Doing well as of 08/2018 cardiol f/u-->continued on ASA and Plavix. F/u 08/2019->ASA d/c'd, plavix continued.  . Prediabetes 2018   2018 A1c 6.3%.  05/2018 A1c 6.1%. 07/2019 A1c 6.4%.  Marland Kitchen Reflux esophagitis    . Stroke [facility]) 2006   Mini  Stroke- not major  . Tobacco abuse, in remission    quit 2009    Past Surgical History:  Procedure Laterality Date  . ABDOMINAL HYSTERECTOMY  1997  . CARDIAC CATHETERIZATION  01/2012   NORMAL  . CARDIOVASCULAR STRESS TEST  02/05/2019   NORMAL.  EF normal.  . CAROTID ENDARTERECTOMY  08/21/2007   right - Dr. Amedeo Plenty  . CHOLECYSTECTOMY  2000  . COLONOSCOPY  approx 2008; 09/23/19   2008 (Dr. Lenon Ahmadi pt->normal.  08/2019 adenoma, some friable areas of colon + anorectal mucosa inflam w/ ulceration, +diverticulosis. Recall 5 yrs  . DEXA  06/06/2016   05/2016 T-score -1.5.  06/2018 T score -1.1.  Repeat 2 yrs.  . ESOPHAGOGASTRODUODENOSCOPY  03/11/2019; 09/23/19   02/2019; 5 cm hiatal hernia; GI suggested surgical repair due to intractable GER.  08/2019 mild chronic gastritis, h pylori neg, multiple benign bx's including duodenum neg for celiac changes.  . Incision and drainage of left thenar space abscess  02/23/15   s/p cat bite (Dr. Amedeo Plenty)  . innominate stent  2007   12/2014 u/s showed patent stent in innominate and L common carotid.  L prox int carotid 40-59% stenosis  . LDCT for lung ca screening  06/2019   NEG->rpt 1 yr  . Left common carotid stent  2006 and 2007   "                    "                  "                          "                             "  . TRANSTHORACIC ECHOCARDIOGRAM  2007   NORMAL  . US CAROTID DOPPLER BILATERAL ([place] HX)  06/2019   left stent w/out stenosis.  R ICA sten 40-50% obst-stable. Rpt 1 yr.     Current Outpatient Medications  Medication Sig Dispense Refill  . albuterol (VENTOLIN HFA) 108 (90 Base) MCG/ACT inhaler Inhale 2 puffs into the lungs every 6 (six) hours as needed for wheezing or shortness of breath. 8 g 6  . atorvastatin (LIPITOR) 80 MG tablet TAKE ONE TABLET BY MOUTH DAILY 30 tablet 5  . cholecalciferol (VITAMIN D) 1000 UNITS tablet Take 1,000 Units by mouth daily.    . clopidogrel (PLAVIX) 75 MG tablet  TAKE ONE TABLET BY MOUTH DAILY 90 tablet 1  . diazepam (VALIUM) 2 MG tablet Take 1 tablet (2 mg total) by mouth 2 (two) times daily. 60 tablet 5  . estradiol (ESTRACE) 0.1 MG/GM vaginal cream  SMARTSIG:Topical    . famotidine (PEPCID) 40 MG tablet TAKE ONE TABLET BY MOUTH DAILY WITH LUNCH 90 tablet 1  . ferrous sulfate 325 (65 FE) MG tablet Take 325 mg by mouth daily.     . fexofenadine (ALLEGRA) 180 MG tablet Take 180 mg by mouth daily. Take 1 tablet daily for allergies    . fluticasone (FLONASE) 50 MCG/ACT nasal spray Place 2 sprays into both nostrils daily. 16 g 6  . lisinopril (ZESTRIL) 5 MG tablet TAKE ONE TABLET BY MOUTH DAILY 90 tablet 1  . Magnesium 500 MG CAPS Take by mouth.    . nitroGLYCERIN (NITROSTAT) 0.4 MG SL tablet Place 1 tablet (0.4 mg total) under the tongue every 5 (five) minutes as needed for chest pain. 25 tablet 6  . Omega-3 Fatty Acids (FISH OIL) 1000 MG CAPS Take 2,000 mg by mouth daily.     . pantoprazole (PROTONIX) 40 MG tablet TAKE ONE TABLET BY MOUTH TWICE DAILY 180 tablet 1  . Tiotropium Bromide Monohydrate (SPIRIVA RESPIMAT) 2.5 MCG/ACT AERS Inhale 2 puffs into the lungs daily. 4 g 11  . tolterodine (DETROL LA) 4 MG 24 hr capsule Take 4 mg by mouth daily.     No current facility-administered medications for this visit.    Allergies:   Doxycycline, Fentanyl, Fluocinolone, Meperidine, Penicillins, Breo ellipta [fluticasone furoate-vilanterol], and Imdur [isosorbide nitrate]    Social History:  The patient  reports that she quit smoking about 13 years ago. Her smoking use included cigarettes. She has never used smokeless tobacco. She reports that she does not drink alcohol and does not use drugs.   Family History:  The patient's family history includes CAD in her mother and paternal grandfather; Heart attack in her mother; Heart disease in her mother; Heart murmur in her sister; Hyperlipidemia in her father and mother; Hypertension in her father and mother;  Osteoporosis in her paternal grandmother; Pneumonia in her father.    ROS:  Please see the history of present illness.   Otherwise, review of systems are positive for persistent DOE.   All other systems are reviewed and negative.    PHYSICAL EXAM: VS:  BP 122/62   Pulse 85   Ht 5' 4.5" (1.638 m)   Wt 134 lb (60.8 kg)   SpO2 95%   BMI 22.65 kg/m  , BMI Body mass index is 22.65 kg/m. GEN: Well nourished, well developed, in no acute distress  HEENT: normal  Neck: no JVD, carotid bruits, or masses Cardiac: RRR; no murmurs, rubs, or gallops,no edema  Respiratory:  clear to auscultation bilaterally, normal work of breathing GI: soft, nontender, nondistended, + BS MS: no deformity or atrophy  Skin: warm and dry, no rash Neuro:  Strength and sensation are intact Psych: euthymic mood, full affect   EKG:   The ekg ordered in 8/21 demonstrates sinus tach, no ST changes   Recent Labs: 07/31/2020: ALT 34; BUN 14; Creatinine, Ser 0.75; Hemoglobin 16.0; Platelets 350.0; Potassium 4.1; Sodium 138; TSH 0.84   Lipid Panel    Component Value Date/Time   CHOL 173 07/31/2020 0929   TRIG 172.0 (H) 07/31/2020 0929   HDL 55.60 07/31/2020 0929   CHOLHDL 3 07/31/2020 0929   VLDL 34.4 07/31/2020 0929   LDLCALC 83 07/31/2020 0929   LDLDIRECT 74.0 11/28/2017 0828     Other studies Reviewed: Additional studies/ records that were reviewed today with results demonstrating: Labs reviewed.   ASSESSMENT AND PLAN:  1. PAD: s/p subclavian stent.  No symptoms from her arms.  2+ radial pulses bilaterally. 2. SHOB: Not improving.  Seems to be getting little bit worse.  Also has some chest pressure at times.  Unclear whether this is from her hiatal hernia that may require surgery.  We will plan for CTA coronaries to evaluate before clearing her for surgery.  50 mg metoprolol prior to the study. 3. Hyperlipidemia: LDL 83 in November 2021.  The current medical regimen is effective;  continue present plan  and medications. 4. HTN: The current medical regimen is effective;  continue present plan and medications. 5. Aortic atherosclerosis: Continue statin therapy 6. Preoperative cardiovascular examination: Needs hiatal hernia surgery with Dr. Kipp Brood.   Has had coughing spells during sleep which are concerning for reflux.   Current medicines are reviewed at length with the patient today.  The patient concerns regarding her medicines were addressed.  The following changes have been made:  No change  Labs/ tests ordered today include:  No orders of the defined types were placed in this encounter.   Recommend 150 minutes/week of aerobic exercise Low fat, low carb, high fiber diet recommended  Disposition:   FU for CTA coronaries   Signed, Larae Grooms, MD  12/07/2020 10:25 AM    [facility] [address], [place], [place]  [address] Phone: [phone]; Fax: [phone]

## 2020-12-07 ENCOUNTER — Ambulatory Visit: Payer: PPO | Admitting: Interventional Cardiology

## 2020-12-07 ENCOUNTER — Other Ambulatory Visit: Payer: Self-pay

## 2020-12-07 ENCOUNTER — Encounter: Payer: Self-pay | Admitting: Interventional Cardiology

## 2020-12-07 VITALS — BP 122/62 | HR 85 | Ht 64.5 in | Wt 134.0 lb

## 2020-12-07 DIAGNOSIS — I10 Essential (primary) hypertension: Secondary | ICD-10-CM | POA: Diagnosis not present

## 2020-12-07 DIAGNOSIS — E782 Mixed hyperlipidemia: Secondary | ICD-10-CM | POA: Diagnosis not present

## 2020-12-07 DIAGNOSIS — I739 Peripheral vascular disease, unspecified: Secondary | ICD-10-CM | POA: Diagnosis not present

## 2020-12-07 DIAGNOSIS — I700 Atherosclerosis of aorta: Secondary | ICD-10-CM | POA: Diagnosis not present

## 2020-12-07 DIAGNOSIS — R0609 Other forms of dyspnea: Secondary | ICD-10-CM

## 2020-12-07 DIAGNOSIS — R072 Precordial pain: Secondary | ICD-10-CM | POA: Diagnosis not present

## 2020-12-07 DIAGNOSIS — R0600 Dyspnea, unspecified: Secondary | ICD-10-CM | POA: Diagnosis not present

## 2020-12-07 LAB — BASIC METABOLIC PANEL
BUN/Creatinine Ratio: 14 (ref 12–28)
BUN: 12 mg/dL (ref 8–27)
CO2: 22 mmol/L (ref 20–29)
Calcium: 10.2 mg/dL (ref 8.7–10.3)
Chloride: 102 mmol/L (ref 96–106)
Creatinine, Ser: 0.83 mg/dL (ref 0.57–1.00)
Glucose: 100 mg/dL — ABNORMAL HIGH (ref 65–99)
Potassium: 5 mmol/L (ref 3.5–5.2)
Sodium: 140 mmol/L (ref 134–144)
eGFR: 76 mL/min/{1.73_m2} (ref >59)

## 2020-12-07 MED ORDER — METOPROLOL TARTRATE 50 MG PO TABS: ORAL_TABLET | ORAL | 0 refills | Status: DC

## 2020-12-07 NOTE — Patient Instructions (Addendum)
Medication Instructions:  Your physician recommends that you continue on your current medications as directed. Please refer to the Current Medication list given to you today.  *If you need a refill on your cardiac medications before your next appointment, please call your pharmacy*   Lab Work: Lab work to be done today If you have labs (blood work) drawn today and your tests are completely normal, you will receive your results only by: Marland Kitchen MyChart Message (if you have MyChart) OR . A paper copy in the mail If you have any lab test that is abnormal or we need to change your treatment, we will call you to review the results.   Testing/Procedures: Your physician has requested that you have cardiac CT. Cardiac computed tomography (CT) is a painless test that uses an x-ray machine to take clear, detailed pictures of your heart. For further information please visit [email or web address]. Please follow instruction sheet as given.      Follow-Up: At [facility], you and your health needs are our priority.  As part of our continuing mission to provide you with exceptional heart care, we have created designated Provider Care Teams.  These Care Teams include your primary Cardiologist (physician) and Advanced Practice Providers (APPs -  Physician Assistants and Nurse Practitioners) who all work together to provide you with the care you need, when you need it.  We recommend signing up for the patient portal called "MyChart".  Sign up information is provided on this After Visit Summary.  MyChart is used to connect with patients for Virtual Visits (Telemedicine).  Patients are able to view lab/test results, encounter notes, upcoming appointments, etc.  Non-urgent messages can be sent to your provider as well.   To learn more about what you can do with MyChart, go to [email or web address].    Your next appointment:  September 14,2022 at 9:00   The format for your next appointment:   In  Person  Provider:   Casandra Doffing, MD   Other Instructions  Your cardiac CT will be scheduled at one of the below locations:   [facility] [address] [place], [place] [address] [phone]  [place] [address] [place], [place] [address] [phone]  If scheduled at [facility], please arrive at the [facility] main entrance (entrance A) of [facility] 30 minutes prior to test start time. Proceed to the [facility] Radiology Department (first floor) to check-in and test prep.  If scheduled at [facility], please arrive 15 mins early for check-in and test prep.  Please follow these instructions carefully (unless otherwise directed):   Marland Kitchen  On the Night Before the Test: . Be sure to Drink plenty of water. . Do not consume any caffeinated/decaffeinated beverages or chocolate 12 hours prior to your test. . Do not take any antihistamines 12 hours prior to your test.  On the Day of the Test: . Drink plenty of water until 1 hour prior to the test. . Do not eat any food 4 hours prior to the test. . You may take your regular medications prior to the test.  . Take metoprolol (Lopressor) two hours prior to test. . HOLD Furosemide/Hydrochlorothiazide morning of the test. . FEMALES- please wear underwire-free bra if available          After the Test: . Drink plenty of water. . After receiving IV contrast, you may experience a mild flushed feeling. This is normal. .  On occasion, you may experience a mild rash up to 24 hours after the test. This is not dangerous. If this occurs, you can take Benadryl 25 mg and increase your fluid intake. . If you experience trouble breathing, this can be serious. If it is severe call 911 IMMEDIATELY. If it is mild, please call our office. . If you take any of these medications: Glipizide/Metformin, Avandament, Glucavance, please do not  take 48 hours after completing test unless otherwise instructed.   Once we have confirmed authorization from your insurance company, we will call you to set up a date and time for your test. Based on how quickly your insurance processes prior authorizations requests, please allow up to 4 weeks to be contacted for scheduling your Cardiac CT appointment. Be advised that routine Cardiac CT appointments could be scheduled as many as 8 weeks after your provider has ordered it.  For non-scheduling related questions, please contact the cardiac imaging nurse navigator should you have any questions/concerns: Marchia Bond, Cardiac Imaging Nurse Navigator Gordy Clement, Cardiac Imaging Nurse Navigator [facility] Direct Office Dial: [phone]   For scheduling needs, including cancellations and rescheduling, please call [place], [phone].

## 2020-12-16 ENCOUNTER — Inpatient Hospital Stay: Admission: RE | Admit: 2020-12-16 | Payer: PPO | Source: Ambulatory Visit

## 2020-12-16 ENCOUNTER — Ambulatory Visit (INDEPENDENT_AMBULATORY_CARE_PROVIDER_SITE_OTHER): Payer: PPO | Admitting: Acute Care

## 2020-12-16 ENCOUNTER — Encounter: Payer: Self-pay | Admitting: Acute Care

## 2020-12-16 ENCOUNTER — Other Ambulatory Visit: Payer: Self-pay

## 2020-12-16 VITALS — BP 112/60 | HR 96 | Temp 97.2°F | Ht 64.5 in | Wt 136.2 lb

## 2020-12-16 DIAGNOSIS — Z87891 Personal history of nicotine dependence: Secondary | ICD-10-CM

## 2020-12-16 DIAGNOSIS — Z122 Encounter for screening for malignant neoplasm of respiratory organs: Secondary | ICD-10-CM

## 2020-12-16 NOTE — Patient Instructions (Signed)
Thank you for participating in the [facility] Cancer Screening Program. It was our pleasure to meet you today. We will call you with the results of your scan within the next few days. Your scan will be assigned a Lung RADS category score by the physicians reading the scans.  This Lung RADS score determines follow up scanning.  See below for description of categories, and follow up screening recommendations. We will be in touch to schedule your follow up screening annually or based on recommendations of our providers. We will fax a copy of your scan results to your Primary Care Physician, or the physician who referred you to the program, to ensure they have the results. Please call the office if you have any questions or concerns regarding your scanning experience or results.  Our office number is [phone]. Please speak with Denise Phelps, RN. She is our Lung Cancer Screening RN. If she is unavailable when you call, please have the office staff send her a message. She will return your call at her earliest convenience. Remember, if your scan is normal, we will scan you annually as long as you continue to meet the criteria for the program. (Age 55-77, Current smoker or smoker who has quit within the last 15 years). If you are a smoker, remember, quitting is the single most powerful action that you can take to decrease your risk of lung cancer and other pulmonary, breathing related problems. We know quitting is hard, and we are here to help.  Please let us know if there is anything we can do to help you meet your goal of quitting. If you are a former smoker, congratulations. We are proud of you! Remain smoke free! Remember you can refer friends or family members through the number above.  We will screen them to make sure they meet criteria for the program. Thank you for helping us take better care of you by participating in Lung Screening.  Lung RADS Categories:  Lung RADS 1: no nodules  or definitely non-concerning nodules.  Recommendation is for a repeat annual scan in 12 months.  Lung RADS 2:  nodules that are non-concerning in appearance and behavior with a very low likelihood of becoming an active cancer. Recommendation is for a repeat annual scan in 12 months.  Lung RADS 3: nodules that are probably non-concerning , includes nodules with a low likelihood of becoming an active cancer.  Recommendation is for a 6-month repeat screening scan. Often noted after an upper respiratory illness. We will be in touch to make sure you have no questions, and to schedule your 6-month scan.  Lung RADS 4 A: nodules with concerning findings, recommendation is most often for a follow up scan in 3 months or additional testing based on our provider's assessment of the scan. We will be in touch to make sure you have no questions and to schedule the recommended 3 month follow up scan.  Lung RADS 4 B:  indicates findings that are concerning. We will be in touch with you to schedule additional diagnostic testing based on our provider's  assessment of the scan.   

## 2020-12-16 NOTE — Progress Notes (Signed)
Shared Decision Making Visit Lung Cancer Screening Program [phone])   Eligibility:  Age 70 y.o.  Pack Years Smoking History Calculation 47 pack year smoking history (# packs/per year x # years smoked)  Recent History of coughing up blood  no  Unexplained weight loss? no ( >Than 15 pounds within the last 6 months )  Prior History Lung / other cancer no (Diagnosis within the last 5 years already requiring surveillance chest CT Scans).  Smoking Status Former Smoker  Former Smokers: Years since quit: 13 years  Quit Date: 2009  Visit Components:  Discussion included one or more decision making aids. yes  Discussion included risk/benefits of screening. yes  Discussion included potential follow up diagnostic testing for abnormal scans. yes  Discussion included meaning and risk of over diagnosis. yes  Discussion included meaning and risk of False Positives. yes  Discussion included meaning of total radiation exposure. yes  Counseling Included:  Importance of adherence to annual lung cancer LDCT screening. yes  Impact of comorbidities on ability to participate in the program. yes  Ability and willingness to under diagnostic treatment. yes  Smoking Cessation Counseling:  Current Smokers:   Discussed importance of smoking cessation. yes  Information about tobacco cessation classes and interventions provided to patient. yes  Patient provided with "ticket" for LDCT Scan. yes  Symptomatic Patient. no  Counseling NA  Diagnosis Code: Tobacco Use Z72.0  Asymptomatic Patient yes  Counseling (Intermediate counseling: > three minutes counseling) H2122  Former Smokers:   Discussed the importance of maintaining cigarette abstinence. yes  Diagnosis Code: Personal History of Nicotine Dependence. Q82.500  Information about tobacco cessation classes and interventions provided to patient. Yes  Patient provided with "ticket" for LDCT Scan. yes  Written Order for Lung  Cancer Screening with LDCT placed in Epic. Yes (CT Chest Lung Cancer Screening Low Dose W/O CM) [id number] Z12.2-Screening of respiratory organs Z87.891-Personal history of nicotine dependence  BP 112/60 (BP Location: Left Arm, Cuff Size: Normal)   Pulse 96   Temp (!) 97.2 F (36.2 C) (Oral)   Ht 5' 4.5" (1.638 m)   Wt 136 lb 3.2 oz (61.8 kg)   SpO2 95%   BMI 23.02 kg/m    I spent 25 minutes of face to face time with [name] discussing the risks and benefits of lung cancer screening. We viewed a power point together that explained in detail the above noted topics. We took the time to pause the power point at intervals to allow for questions to be asked and answered to ensure understanding. We discussed that she had taken the single most powerful action possible to decrease her risk of developing lung cancer when she quit smoking. I counseled her to remain smoke free, and to contact me if shr ever had the desire to smoke again so that I can provide resources and tools to help support the effort to remain smoke free. We discussed the time and location of the scan, and that either  Doroteo Glassman RN or I will call with the results within  24-48 hours of receiving them. She has my card and contact information in the event she needs to speak with me, in addition to a copy of the power point we reviewed as a resource. She verbalized understanding of all of the above and had no further questions upon leaving the office.     I explained to the patient that there has been a high incidence of coronary artery disease noted on these exams.  I explained that this is a non-gated exam therefore degree or severity cannot be determined. This patient is currently on statin therapy. I have asked the patient to follow-up with their PCP regarding any incidental finding of coronary artery disease and management with diet or medication as they feel is clinically indicated. The patient verbalized understanding of the  above and had no further questions.     Laura Spatz, NP 12/16/2020

## 2020-12-18 ENCOUNTER — Telehealth (HOSPITAL_COMMUNITY): Payer: Self-pay | Admitting: Emergency Medicine

## 2020-12-18 NOTE — Telephone Encounter (Signed)
Reaching out to patient to offer assistance regarding upcoming cardiac imaging study; pt verbalizes understanding of appt date/time, parking situation and where to check in, pre-test NPO status and medications ordered, and verified current allergies; name and call back number provided for further questions should they arise Marchia Bond RN Navigator Cardiac Imaging [facility] [phone] office [phone] cell   50mg  metoprolol 2 hr prior

## 2020-12-21 ENCOUNTER — Encounter (HOSPITAL_COMMUNITY): Payer: Self-pay

## 2020-12-21 ENCOUNTER — Ambulatory Visit (HOSPITAL_COMMUNITY)
Admission: RE | Admit: 2020-12-21 | Discharge: 2020-12-21 | Disposition: A | Discharge status: Home / Self Care | Payer: PPO | Source: Ambulatory Visit | Attending: Interventional Cardiology | Admitting: Interventional Cardiology

## 2020-12-21 ENCOUNTER — Other Ambulatory Visit: Payer: Self-pay

## 2020-12-21 DIAGNOSIS — R072 Precordial pain: Secondary | ICD-10-CM

## 2020-12-21 DIAGNOSIS — R9430 Abnormal result of cardiovascular function study, unspecified: Secondary | ICD-10-CM | POA: Diagnosis not present

## 2020-12-21 DIAGNOSIS — I700 Atherosclerosis of aorta: Secondary | ICD-10-CM | POA: Diagnosis not present

## 2020-12-21 MED ORDER — NITROGLYCERIN 0.4 MG SL SUBL
0.8000 mg | SUBLINGUAL_TABLET | Freq: Once | SUBLINGUAL | Status: AC
  Administered 2020-12-21: 0.8 mg via SUBLINGUAL

## 2020-12-21 MED ORDER — IOHEXOL 350 MG/ML SOLN
100.0000 mL | Freq: Once | INTRAVENOUS | Status: AC | PRN
  Administered 2020-12-21: 100 mL via INTRAVENOUS

## 2020-12-21 MED ORDER — NITROGLYCERIN 0.4 MG SL SUBL
SUBLINGUAL_TABLET | SUBLINGUAL | Status: AC
  Filled 2020-12-21: qty 2

## 2020-12-21 MED ORDER — ONDANSETRON HCL 4 MG/2ML IJ SOLN: 4.0000 mg | Freq: Once | INTRAMUSCULAR | Status: DC

## 2020-12-23 DIAGNOSIS — N952 Postmenopausal atrophic vaginitis: Secondary | ICD-10-CM | POA: Diagnosis not present

## 2020-12-23 DIAGNOSIS — N3281 Overactive bladder: Secondary | ICD-10-CM | POA: Diagnosis not present

## 2020-12-24 ENCOUNTER — Encounter: Payer: Self-pay | Admitting: *Deleted

## 2020-12-24 ENCOUNTER — Other Ambulatory Visit: Payer: Self-pay | Admitting: *Deleted

## 2020-12-24 DIAGNOSIS — K449 Diaphragmatic hernia without obstruction or gangrene: Secondary | ICD-10-CM

## 2021-01-14 ENCOUNTER — Other Ambulatory Visit: Payer: Self-pay

## 2021-01-14 ENCOUNTER — Other Ambulatory Visit: Payer: Self-pay | Admitting: Family Medicine

## 2021-01-14 ENCOUNTER — Encounter (HOSPITAL_COMMUNITY)
Admission: RE | Admit: 2021-01-14 | Discharge: 2021-01-14 | Disposition: A | Discharge status: Home / Self Care | Payer: PPO | Source: Ambulatory Visit | Attending: Thoracic Surgery (Cardiothoracic Vascular Surgery) | Admitting: Thoracic Surgery (Cardiothoracic Vascular Surgery)

## 2021-01-14 ENCOUNTER — Other Ambulatory Visit (HOSPITAL_COMMUNITY): Admission: RE | Admit: 2021-01-14 | Payer: PPO | Source: Ambulatory Visit

## 2021-01-14 ENCOUNTER — Encounter (HOSPITAL_COMMUNITY): Payer: Self-pay

## 2021-01-14 DIAGNOSIS — Z7902 Long term (current) use of antithrombotics/antiplatelets: Secondary | ICD-10-CM | POA: Diagnosis not present

## 2021-01-14 DIAGNOSIS — Z95828 Presence of other vascular implants and grafts: Secondary | ICD-10-CM | POA: Insufficient documentation

## 2021-01-14 DIAGNOSIS — Z01818 Encounter for other preprocedural examination: Secondary | ICD-10-CM | POA: Insufficient documentation

## 2021-01-14 DIAGNOSIS — I10 Essential (primary) hypertension: Secondary | ICD-10-CM | POA: Insufficient documentation

## 2021-01-14 DIAGNOSIS — I2510 Atherosclerotic heart disease of native coronary artery without angina pectoris: Secondary | ICD-10-CM | POA: Diagnosis not present

## 2021-01-14 DIAGNOSIS — I6523 Occlusion and stenosis of bilateral carotid arteries: Secondary | ICD-10-CM | POA: Insufficient documentation

## 2021-01-14 DIAGNOSIS — K449 Diaphragmatic hernia without obstruction or gangrene: Secondary | ICD-10-CM | POA: Diagnosis not present

## 2021-01-14 DIAGNOSIS — E785 Hyperlipidemia, unspecified: Secondary | ICD-10-CM | POA: Diagnosis not present

## 2021-01-14 DIAGNOSIS — I871 Compression of vein: Secondary | ICD-10-CM | POA: Diagnosis not present

## 2021-01-14 DIAGNOSIS — Z20822 Contact with and (suspected) exposure to covid-19: Secondary | ICD-10-CM | POA: Diagnosis not present

## 2021-01-14 HISTORY — DX: Prediabetes: R73.03

## 2021-01-14 HISTORY — DX: Personal history of urinary calculi: Z87.442

## 2021-01-14 HISTORY — DX: Other specified postprocedural states: Z98.890

## 2021-01-14 HISTORY — DX: Anemia, unspecified: D64.9

## 2021-01-14 HISTORY — DX: Nausea with vomiting, unspecified: R11.2

## 2021-01-14 LAB — CBC
HCT: 46.5 % — ABNORMAL HIGH (ref 36.0–46.0)
Hemoglobin: 15.6 g/dL — ABNORMAL HIGH (ref 12.0–15.0)
MCH: 31 pg (ref 26.0–34.0)
MCHC: 33.5 g/dL (ref 30.0–36.0)
MCV: 92.3 fL (ref 80.0–100.0)
Platelets: 277 10*3/uL (ref 150–400)
RBC: 5.04 MIL/uL (ref 3.87–5.11)
RDW: 12.7 % (ref 11.5–15.5)
WBC: 7.4 10*3/uL (ref 4.0–10.5)
nRBC: 0 % (ref 0.0–0.2)

## 2021-01-14 LAB — COMPREHENSIVE METABOLIC PANEL
ALT: 29 U/L (ref 0–44)
AST: 30 U/L (ref 15–41)
Albumin: 4.1 g/dL (ref 3.5–5.0)
Alkaline Phosphatase: 86 U/L (ref 38–126)
Anion gap: 10 (ref 5–15)
BUN: 18 mg/dL (ref 8–23)
CO2: 21 mmol/L — ABNORMAL LOW (ref 22–32)
Calcium: 9.6 mg/dL (ref 8.9–10.3)
Chloride: 106 mmol/L (ref 98–111)
Creatinine, Ser: 0.69 mg/dL (ref 0.44–1.00)
GFR, Estimated: >60 mL/min (ref >60)
Glucose, Bld: 98 mg/dL (ref 70–99)
Potassium: 4.3 mmol/L (ref 3.5–5.1)
Sodium: 137 mmol/L (ref 135–145)
Total Bilirubin: 1.9 mg/dL — ABNORMAL HIGH (ref 0.3–1.2)
Total Protein: 6.9 g/dL (ref 6.5–8.1)

## 2021-01-14 LAB — BLOOD GAS, ARTERIAL
Acid-base deficit: 0.5 mmol/L (ref 0.0–2.0)
Bicarbonate: 23.4 mmol/L (ref 20.0–28.0)
Drawn by: 58793
FIO2: 21
O2 Saturation: 97.5 %
Patient temperature: 37
pCO2 arterial: 36.2 mmHg (ref 32.0–48.0)
pH, Arterial: 7.425 (ref 7.350–7.450)
pO2, Arterial: 97.5 mmHg (ref 83.0–108.0)

## 2021-01-14 LAB — URINALYSIS, ROUTINE W REFLEX MICROSCOPIC
Bilirubin Urine: NEGATIVE
Glucose, UA: NEGATIVE mg/dL
Hgb urine dipstick: NEGATIVE
Ketones, ur: NEGATIVE mg/dL
Leukocytes,Ua: NEGATIVE
Nitrite: NEGATIVE
Protein, ur: NEGATIVE mg/dL
Specific Gravity, Urine: 1.014 (ref 1.005–1.030)
pH: 7 (ref 5.0–8.0)

## 2021-01-14 LAB — HEMOGLOBIN A1C
Hgb A1c MFr Bld: 5.9 % — ABNORMAL HIGH (ref 4.8–5.6)
Mean Plasma Glucose: 122.63 mg/dL

## 2021-01-14 LAB — TYPE AND SCREEN
ABO/RH(D): O POS
Antibody Screen: NEGATIVE

## 2021-01-14 LAB — SURGICAL PCR SCREEN
MRSA, PCR: NEGATIVE
Staphylococcus aureus: POSITIVE — AB

## 2021-01-14 LAB — APTT: aPTT: 26 seconds (ref 24–36)

## 2021-01-14 LAB — PROTIME-INR
INR: 0.9 (ref 0.8–1.2)
Prothrombin Time: 12.2 seconds (ref 11.4–15.2)

## 2021-01-14 LAB — SARS CORONAVIRUS 2 (TAT 6-24 HRS): SARS Coronavirus 2: NEGATIVE

## 2021-01-14 NOTE — Pre-Procedure Instructions (Signed)
[name]  01/14/2021    Your procedure is scheduled on Monday, January 18, 2021 at 7:45 AM.   Report to [facility] Entrance "A" Admitting Office at 5:45 AM.   Call this number if you have problems the morning of surgery: [phone]   Questions prior to day of surgery, please call [phone] between 8 & 4 PM.   Remember:  Do not eat or drink after midnight Sunday,  01/17/21.  Take these medicines the morning of surgery with A SIP OF WATER: Atorvastatin (Lipitor), Diazepam (Valium), Famotidine (Pepcid), Fexofenadine (Allegra), Pantoprazole (Protonix), Tolterodine (Detrol LA). Spiriva inhaler - if needed  Stop Plavix as instructed by cardiologist/surgeon. Stop Vitamins and Fish oil as of today. Do not use Aspirin containing products, NSAIDS (Ibuprofen, Aleve, etc) or Herbal medications prior to surgery.    Do not wear jewelry, make-up or nail polish.  Do not wear lotions, powders, perfumes or deodorant.  Do not shave 48 hours prior to surgery.    Do not bring valuables to the hospital.  [facility] is not responsible for any belongings or valuables.  Contacts, dentures or bridgework may not be worn into surgery.  Leave your suitcase in the car.  After surgery it may be brought to your room.  For patients admitted to the hospital, discharge time will be determined by your treatment team.  [facility] - Preparing for Surgery  Before surgery, you can play an important role.  Because skin is not sterile, your skin needs to be as free of germs as possible.  You can reduce the number of germs on you skin by washing with CHG (chlorahexidine gluconate) soap before surgery.  CHG is an antiseptic cleaner which kills germs and bonds with the skin to continue killing germs even after washing.  Oral Hygiene is also important in reducing the risk of infection.  Remember to brush your teeth with your regular toothpaste the morning of surgery.  Please DO NOT use if you have an allergy to  CHG or antibacterial soaps.  If your skin becomes reddened/irritated stop using the CHG and inform your nurse when you arrive at Short Stay.  Do not shave (including legs and underarms) for at least 48 hours prior to the first CHG shower.  You may shave your face.  Please follow these instructions carefully:   1.  Shower with CHG Soap the night before surgery and the morning of Surgery.  2.  If you choose to wash your hair, wash your hair first as usual with your normal shampoo.  3.  After you shampoo, rinse your hair and body thoroughly to remove the shampoo. 4.  Use CHG as you would any other liquid soap.  You can apply chg directly to the skin and wash gently with a      scrungie or washcloth.           5.  Apply the CHG Soap to your body ONLY FROM THE NECK DOWN.   Do not use on open wounds or open sores. Avoid contact with your eyes, ears, mouth and genitals (private parts).  Wash genitals (private parts) with your normal soap, do this prior to using CHG soap.  6.  Wash thoroughly, paying special attention to the area where your surgery will be performed.  7.  Thoroughly rinse your body with warm water from the neck down.  8.  DO NOT shower/wash with your normal soap after using and rinsing off the CHG Soap.  9.  Pat yourself dry with a clean towel.            10.  Wear clean pajamas.            11.  Place clean sheets on your bed the night of your first shower and do not sleep with pets.  Day of Surgery  Shower as above. Do not apply any lotions/deodorants the morning of surgery.   Please wear clean clothes to the hospital. Remember to brush your teeth with toothpaste.  Please read over the fact sheets that you were given.

## 2021-01-14 NOTE — Progress Notes (Signed)
PCP - Dr. Julien Nordmann Cardiologist - Dr. Irish Lack  Chest x-ray - day of surgery EKG - today Stress Test - 02/05/19  Blood Thinner Instructions: to hold Plavix 5 days prior - last dose 01/13/21   COVID TEST- today   Anesthesia review: yes - hx of mitral valve prolapse, pt states Dr. Irish Lack instructed her to hold Plavix and he knows about the surgery. I don't see a specific clearance note from him.  Patient wants it known that she does NOT want any Narcotics! She states they make her throw up and she does not want to do that after this particular surgery.  Patient denies shortness of breath, fever, cough and chest pain at PAT appointment   All instructions explained to the patient, with a verbal understanding of the material. Patient agrees to go over the instructions while at home for a better understanding. Patient also instructed to self quarantine after being tested for COVID-19. The opportunity to ask questions was provided.

## 2021-01-14 NOTE — Telephone Encounter (Signed)
RF request for Famotidine LOV: 07/31/20 Next ov: 01/28/21 Last written: 07/31/20(90,1)  Requesting: Diazepam Contract: 01/29/20 UDS: 11/28/18 Last Visit: 07/31/20 Next Visit:01/28/21 Last Refill: 07/31/20(60,5)  Please Advise. Medications pending. Pharmacy note: Short filled to sync all meds for once monthly pick up. Not enough to fill full 30 days with rest of her meds.

## 2021-01-15 NOTE — Telephone Encounter (Signed)
Pt advised refills sent. °

## 2021-01-15 NOTE — Anesthesia Preprocedure Evaluation (Addendum)
Anesthesia Evaluation  Patient identified by MRN, date of birth, ID band Patient awake    Reviewed: Allergy & Precautions, NPO status , Patient's Chart, lab work & pertinent test results  History of Anesthesia Complications (+) PONV and history of anesthetic complications  Airway Mallampati: II  TM Distance: >3 FB Neck ROM: Full    Dental  (+) Upper Dentures, Lower Dentures   Pulmonary COPD, former smoker,    Pulmonary exam normal        Cardiovascular hypertension, Pt. on medications + Peripheral Vascular Disease  Normal cardiovascular exam   '21 Carotid US - 40-59% left ICAS, patent right CEA  '20 Myoperfusion -  Nuclear stress EF: 56%. The left ventricular ejection fraction is normal (55-65%). No T wave inversion was noted during stress. There was no ST segment deviation noted during stress. The study is normal. This is a low risk study.     Neuro/Psych PSYCHIATRIC DISORDERS Anxiety TIA   GI/Hepatic Neg liver ROS, hiatal hernia, GERD  Medicated and Controlled,  Endo/Other   Pre-DM   Renal/GU negative Renal ROS     Musculoskeletal negative musculoskeletal ROS (+)   Abdominal   Peds  Hematology  On plavix   Anesthesia Other Findings Covid test negative   Reproductive/Obstetrics                           Anesthesia Physical Anesthesia Plan  ASA: III  Anesthesia Plan: General   Post-op Pain Management:    Induction: Intravenous  PONV Risk Score and Plan: 4 or greater and Treatment may vary due to age or medical condition, Ondansetron, Aprepitant, Dexamethasone and Propofol infusion  Airway Management Planned: Oral ETT  Additional Equipment: Arterial line  Intra-op Plan:   Post-operative Plan: Possible Post-op intubation/ventilation  Informed Consent: I have reviewed the patients History and Physical, chart, labs and discussed the procedure including the risks,  benefits and alternatives for the proposed anesthesia with the patient or authorized representative who has indicated his/her understanding and acceptance.     Dental advisory given  Plan Discussed with: CRNA and Anesthesiologist  Anesthesia Plan Comments: (2 large bore PIV vs. CVL. Intraop tylenol and ketamine to minimize narcotic need  )     Anesthesia Quick Evaluation

## 2021-01-15 NOTE — Progress Notes (Signed)
Anesthesia Chart Review:  Follows with cardiology for history of CAD, HTN, HLD.  She had a cardiac cath in 2013 showing no significant coronary artery disease and only minimal coronary calcification.  Stress test 2020 was low risk.  Last seen by Dr. Irish Lack 12/07/2020 for preop clearance.  Per note, he recommended coronary CTA for evaluation prior to surgery.  CTA was ultimately sent for [facility] analysis which did not show any significant stenosis.  Dr. Irish Lack commented on this result 12/22/2020 stating, "No significant CAD. THere is some mild disease, but it does not restrict blood flow. 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 She has remote history of left common carotid artery stent 03/31/05, innominate artery stent 06/23/06, and right carotid endarterectomy 08/21/07 by Dr. Amedeo Plenty.   Last seen 07/16/2020.  At that time duplex showed patent right carotid s/p CEA, patent left carotid stent. There are elevated velocities in the right subclavian and innominate stent.  He was recommended to continue statin and Plavix and follow-up in 1 year with repeat carotid duplex.  Preop labs reviewed, unremarkable.  EKG 01/14/2021: NSR.  Rate 71.  PFT 07/05/2019: FVC-%Pred-Pre Latest Units: % 80  FEV1-%Pred-Pre Latest Units: % 78  FEV1FVC-%Pred-Pre Latest Units: % 97  TLC % pred Latest Units: % 98  RV % pred Latest Units: % 115  DLCO unc % pred Latest Units: % 79    Carotid duplex 07/16/2020: Summary:  Right Carotid: Patent right carotid endarterectomy without evidence for restenosis. The innomininate stent appears patent, limited visability.  Left Carotid: Velocities in the left ICA are consistent with a 40-59% stenosis.  Vertebrals: Bilateral vertebral arteries demonstrate antegrade flow.  Subclavians: Bilateral subclavian arteries were stenotic.   CT coronary FFR 12/21/2020: 1. Left Main:  No significant stenosis.  2. LAD: No significant stenosis.  0.96, 0.88, 0.82 3. LCX: No significant stenosis.  0.96 4. RCA: No significant stenosis.  0.98, 0.85, 0.92  IMPRESSION: 1.  CT FFR analysis didn't show any significant stenosis.  Stress test in 02/05/19:  Nuclear stress EF: 56%.  The left ventricular ejection fraction is normal (55-65%).  No T wave inversion was noted during stress.  There was no ST segment deviation noted during stress.  The study is normal.  This is a low risk study.  Normal perfusion. LVEF 56% with normal wall motion. This is a low risk study.    [name] [facility] Short Stay Center/Anesthesiology Phone [phone] 01/15/2021 10:45 AM

## 2021-01-16 ENCOUNTER — Other Ambulatory Visit: Payer: Self-pay | Admitting: Family Medicine

## 2021-01-18 ENCOUNTER — Inpatient Hospital Stay (HOSPITAL_COMMUNITY): Payer: PPO | Admitting: Vascular Surgery

## 2021-01-18 ENCOUNTER — Inpatient Hospital Stay (HOSPITAL_COMMUNITY): Payer: PPO

## 2021-01-18 ENCOUNTER — Inpatient Hospital Stay (HOSPITAL_COMMUNITY)
Admission: RE | Admit: 2021-01-18 | Discharge: 2021-01-19 | DRG: 328 | Disposition: A | Discharge status: Home / Self Care | Payer: PPO | Attending: Thoracic Surgery (Cardiothoracic Vascular Surgery) | Admitting: Thoracic Surgery (Cardiothoracic Vascular Surgery)

## 2021-01-18 ENCOUNTER — Other Ambulatory Visit: Payer: Self-pay

## 2021-01-18 ENCOUNTER — Inpatient Hospital Stay (HOSPITAL_COMMUNITY): Payer: PPO | Admitting: Certified Registered"

## 2021-01-18 ENCOUNTER — Encounter (HOSPITAL_COMMUNITY)
Admission: RE | Disposition: A | Discharge status: Home / Self Care | Payer: Self-pay | Source: Home / Self Care | Attending: Thoracic Surgery (Cardiothoracic Vascular Surgery)

## 2021-01-18 ENCOUNTER — Encounter (HOSPITAL_COMMUNITY): Payer: Self-pay | Admitting: Thoracic Surgery (Cardiothoracic Vascular Surgery)

## 2021-01-18 DIAGNOSIS — Z880 Allergy status to penicillin: Secondary | ICD-10-CM

## 2021-01-18 DIAGNOSIS — Z8673 Personal history of transient ischemic attack (TIA), and cerebral infarction without residual deficits: Secondary | ICD-10-CM | POA: Diagnosis not present

## 2021-01-18 DIAGNOSIS — I739 Peripheral vascular disease, unspecified: Secondary | ICD-10-CM | POA: Diagnosis present

## 2021-01-18 DIAGNOSIS — Z79899 Other long term (current) drug therapy: Secondary | ICD-10-CM | POA: Diagnosis not present

## 2021-01-18 DIAGNOSIS — Z881 Allergy status to other antibiotic agents status: Secondary | ICD-10-CM

## 2021-01-18 DIAGNOSIS — K219 Gastro-esophageal reflux disease without esophagitis: Secondary | ICD-10-CM | POA: Diagnosis present

## 2021-01-18 DIAGNOSIS — Z8249 Family history of ischemic heart disease and other diseases of the circulatory system: Secondary | ICD-10-CM | POA: Diagnosis not present

## 2021-01-18 DIAGNOSIS — Z888 Allergy status to other drugs, medicaments and biological substances status: Secondary | ICD-10-CM | POA: Diagnosis not present

## 2021-01-18 DIAGNOSIS — E782 Mixed hyperlipidemia: Secondary | ICD-10-CM | POA: Diagnosis not present

## 2021-01-18 DIAGNOSIS — K449 Diaphragmatic hernia without obstruction or gangrene: Principal | ICD-10-CM

## 2021-01-18 DIAGNOSIS — Z87891 Personal history of nicotine dependence: Secondary | ICD-10-CM | POA: Diagnosis not present

## 2021-01-18 DIAGNOSIS — J939 Pneumothorax, unspecified: Secondary | ICD-10-CM

## 2021-01-18 DIAGNOSIS — J449 Chronic obstructive pulmonary disease, unspecified: Secondary | ICD-10-CM | POA: Diagnosis not present

## 2021-01-18 DIAGNOSIS — Z7902 Long term (current) use of antithrombotics/antiplatelets: Secondary | ICD-10-CM

## 2021-01-18 DIAGNOSIS — Z01818 Encounter for other preprocedural examination: Secondary | ICD-10-CM | POA: Diagnosis not present

## 2021-01-18 DIAGNOSIS — Z9889 Other specified postprocedural states: Secondary | ICD-10-CM | POA: Diagnosis not present

## 2021-01-18 DIAGNOSIS — K9189 Other postprocedural complications and disorders of digestive system: Secondary | ICD-10-CM

## 2021-01-18 DIAGNOSIS — J439 Emphysema, unspecified: Secondary | ICD-10-CM | POA: Diagnosis not present

## 2021-01-18 HISTORY — PX: ESOPHAGOGASTRODUODENOSCOPY: SHX5428

## 2021-01-18 HISTORY — PX: XI ROBOTIC ASSISTED PARAESOPHAGEAL HERNIA REPAIR: SHX6871

## 2021-01-18 LAB — CBC
HCT: 42.6 % (ref 36.0–46.0)
Hemoglobin: 14.3 g/dL (ref 12.0–15.0)
MCH: 31.2 pg (ref 26.0–34.0)
MCHC: 33.6 g/dL (ref 30.0–36.0)
MCV: 92.8 fL (ref 80.0–100.0)
Platelets: 205 10*3/uL (ref 150–400)
RBC: 4.59 MIL/uL (ref 3.87–5.11)
RDW: 12.7 % (ref 11.5–15.5)
WBC: 9.1 10*3/uL (ref 4.0–10.5)
nRBC: 0 % (ref 0.0–0.2)

## 2021-01-18 LAB — BASIC METABOLIC PANEL
Anion gap: 8 (ref 5–15)
BUN: 9 mg/dL (ref 8–23)
CO2: 23 mmol/L (ref 22–32)
Calcium: 9 mg/dL (ref 8.9–10.3)
Chloride: 106 mmol/L (ref 98–111)
Creatinine, Ser: 0.7 mg/dL (ref 0.44–1.00)
GFR, Estimated: >60 mL/min (ref >60)
Glucose, Bld: 143 mg/dL — ABNORMAL HIGH (ref 70–99)
Potassium: 3.9 mmol/L (ref 3.5–5.1)
Sodium: 137 mmol/L (ref 135–145)

## 2021-01-18 LAB — GLUCOSE, CAPILLARY
Glucose-Capillary: 129 mg/dL — ABNORMAL HIGH (ref 70–99)
Glucose-Capillary: 147 mg/dL — ABNORMAL HIGH (ref 70–99)
Glucose-Capillary: 150 mg/dL — ABNORMAL HIGH (ref 70–99)

## 2021-01-18 SURGERY — REPAIR, HERNIA, PARAESOPHAGEAL, ROBOT-ASSISTED
Anesthesia: General | Site: Chest

## 2021-01-18 MED ORDER — PHENYLEPHRINE HCL-NACL 10-0.9 MG/250ML-% IV SOLN
INTRAVENOUS | Status: DC | PRN
  Administered 2021-01-18: 25 ug/min via INTRAVENOUS

## 2021-01-18 MED ORDER — 0.9 % SODIUM CHLORIDE (POUR BTL) OPTIME
TOPICAL | Status: DC | PRN
  Administered 2021-01-18: 2000 mL

## 2021-01-18 MED ORDER — PROPOFOL 10 MG/ML IV BOLUS
INTRAVENOUS | Status: DC | PRN
  Administered 2021-01-18: 20 mg via INTRAVENOUS
  Administered 2021-01-18: 100 mg via INTRAVENOUS

## 2021-01-18 MED ORDER — OXYCODONE HCL 5 MG/5ML PO SOLN
5.0000 mg | Freq: Once | ORAL | Status: DC | PRN
Start: 2021-01-18 — End: 2021-01-18

## 2021-01-18 MED ORDER — ACETAMINOPHEN 10 MG/ML IV SOLN
INTRAVENOUS | Status: DC | PRN
  Administered 2021-01-18: 1000 mg via INTRAVENOUS

## 2021-01-18 MED ORDER — FENTANYL CITRATE (PF) 100 MCG/2ML IJ SOLN: 25.0000 ug | INTRAMUSCULAR | Status: DC | PRN

## 2021-01-18 MED ORDER — APREPITANT 40 MG PO CAPS
40.0000 mg | ORAL_CAPSULE | ORAL | Status: AC
  Administered 2021-01-18: 40 mg via ORAL
  Filled 2021-01-18: qty 1

## 2021-01-18 MED ORDER — LIDOCAINE 2% (20 MG/ML) 5 ML SYRINGE
INTRAMUSCULAR | Status: DC | PRN
  Administered 2021-01-18: 60 mg via INTRAVENOUS

## 2021-01-18 MED ORDER — VANCOMYCIN HCL 1000 MG/200ML IV SOLN
1000.0000 mg | INTRAVENOUS | Status: AC
  Administered 2021-01-19: 1000 mg via INTRAVENOUS
  Filled 2021-01-18: qty 200

## 2021-01-18 MED ORDER — PROPOFOL 10 MG/ML IV BOLUS
INTRAVENOUS | Status: AC
  Filled 2021-01-18: qty 20

## 2021-01-18 MED ORDER — BUPIVACAINE LIPOSOME 1.3 % IJ SUSP
INTRAMUSCULAR | Status: AC
  Filled 2021-01-18: qty 20

## 2021-01-18 MED ORDER — OXYCODONE HCL 5 MG PO TABS: 5.0000 mg | ORAL_TABLET | Freq: Once | ORAL | Status: DC | PRN

## 2021-01-18 MED ORDER — SUGAMMADEX SODIUM 200 MG/2ML IV SOLN
INTRAVENOUS | Status: DC | PRN
  Administered 2021-01-18: 200 mg via INTRAVENOUS

## 2021-01-18 MED ORDER — FLUTICASONE PROPIONATE 50 MCG/ACT NA SUSP
2.0000 | Freq: Every day | NASAL | Status: DC
  Administered 2021-01-18: 2 via NASAL
  Filled 2021-01-18: qty 16

## 2021-01-18 MED ORDER — ONDANSETRON HCL 4 MG/2ML IJ SOLN
INTRAMUSCULAR | Status: DC | PRN
  Administered 2021-01-18: 4 mg via INTRAVENOUS

## 2021-01-18 MED ORDER — BUPIVACAINE HCL 0.5 % IJ SOLN
INTRAMUSCULAR | Status: DC | PRN
  Administered 2021-01-18: 30 mL

## 2021-01-18 MED ORDER — CHLORHEXIDINE GLUCONATE 0.12 % MT SOLN
OROMUCOSAL | Status: AC
  Administered 2021-01-18: 15 mL via OROMUCOSAL
  Filled 2021-01-18: qty 15

## 2021-01-18 MED ORDER — ONDANSETRON HCL 4 MG/2ML IJ SOLN
4.0000 mg | Freq: Four times a day (QID) | INTRAMUSCULAR | Status: DC
  Administered 2021-01-18 – 2021-01-19 (×4): 4 mg via INTRAVENOUS
  Filled 2021-01-18 (×5): qty 2

## 2021-01-18 MED ORDER — KETOROLAC TROMETHAMINE 15 MG/ML IJ SOLN
15.0000 mg | Freq: Four times a day (QID) | INTRAMUSCULAR | Status: DC
  Administered 2021-01-18 – 2021-01-19 (×3): 15 mg via INTRAVENOUS
  Filled 2021-01-18 (×4): qty 1

## 2021-01-18 MED ORDER — VANCOMYCIN HCL IN DEXTROSE 1-5 GM/200ML-% IV SOLN
1000.0000 mg | INTRAVENOUS | Status: AC
  Administered 2021-01-18: 1000 mg via INTRAVENOUS
  Filled 2021-01-18: qty 200

## 2021-01-18 MED ORDER — CHLORHEXIDINE GLUCONATE 0.12 % MT SOLN: 15.0000 mL | OROMUCOSAL | Status: DC

## 2021-01-18 MED ORDER — MORPHINE SULFATE (PF) 2 MG/ML IV SOLN: 1.0000 mg | INTRAVENOUS | Status: DC | PRN

## 2021-01-18 MED ORDER — ONDANSETRON HCL 4 MG/2ML IJ SOLN: 4.0000 mg | Freq: Once | INTRAMUSCULAR | Status: DC | PRN

## 2021-01-18 MED ORDER — KETAMINE HCL 50 MG/5ML IJ SOSY
PREFILLED_SYRINGE | INTRAMUSCULAR | Status: AC
  Filled 2021-01-18: qty 5

## 2021-01-18 MED ORDER — ACETAMINOPHEN 10 MG/ML IV SOLN
INTRAVENOUS | Status: AC
  Filled 2021-01-18: qty 100

## 2021-01-18 MED ORDER — EPINEPHRINE PF 1 MG/ML IJ SOLN
INTRAMUSCULAR | Status: AC
  Filled 2021-01-18: qty 1

## 2021-01-18 MED ORDER — MIDAZOLAM HCL 2 MG/2ML IJ SOLN
INTRAMUSCULAR | Status: AC
  Filled 2021-01-18: qty 2

## 2021-01-18 MED ORDER — ROCURONIUM BROMIDE 10 MG/ML (PF) SYRINGE
PREFILLED_SYRINGE | INTRAVENOUS | Status: DC | PRN
  Administered 2021-01-18: 100 mg via INTRAVENOUS
  Administered 2021-01-18: 30 mg via INTRAVENOUS

## 2021-01-18 MED ORDER — FENTANYL CITRATE (PF) 100 MCG/2ML IJ SOLN
INTRAMUSCULAR | Status: DC | PRN
  Administered 2021-01-18: 100 ug via INTRAVENOUS
  Administered 2021-01-18 (×3): 50 ug via INTRAVENOUS

## 2021-01-18 MED ORDER — LACTATED RINGERS IV SOLN: INTRAVENOUS | Status: DC

## 2021-01-18 MED ORDER — INSULIN ASPART 100 UNIT/ML ~~LOC~~ SOLN
0.0000 [IU] | SUBCUTANEOUS | Status: DC
  Administered 2021-01-18 (×3): 2 [IU] via SUBCUTANEOUS

## 2021-01-18 MED ORDER — PROPOFOL 10 MG/ML IV BOLUS
INTRAVENOUS | Status: DC | PRN
  Administered 2021-01-18: 25 ug/kg/min via INTRAVENOUS

## 2021-01-18 MED ORDER — BUPIVACAINE LIPOSOME 1.3 % IJ SUSP
INTRAMUSCULAR | Status: DC | PRN
  Administered 2021-01-18: 20 mL

## 2021-01-18 MED ORDER — DEXMEDETOMIDINE HCL IN NACL 400 MCG/100ML IV SOLN
0.0000 ug/kg/h | INTRAVENOUS | Status: DC
  Filled 2021-01-18: qty 100

## 2021-01-18 MED ORDER — MIDAZOLAM HCL 5 MG/5ML IJ SOLN
INTRAMUSCULAR | Status: DC | PRN
  Administered 2021-01-18: 2 mg via INTRAVENOUS

## 2021-01-18 MED ORDER — FENTANYL CITRATE (PF) 250 MCG/5ML IJ SOLN
INTRAMUSCULAR | Status: AC
  Filled 2021-01-18: qty 5

## 2021-01-18 MED ORDER — ALBUMIN HUMAN 5 % IV SOLN: INTRAVENOUS | Status: DC | PRN

## 2021-01-18 MED ORDER — ORAL CARE MOUTH RINSE: 15.0000 mL | Freq: Once | OROMUCOSAL | Status: AC

## 2021-01-18 MED ORDER — BUPIVACAINE HCL (PF) 0.5 % IJ SOLN
INTRAMUSCULAR | Status: AC
  Filled 2021-01-18: qty 30

## 2021-01-18 MED ORDER — PHENYLEPHRINE 40 MCG/ML (10ML) SYRINGE FOR IV PUSH (FOR BLOOD PRESSURE SUPPORT)
PREFILLED_SYRINGE | INTRAVENOUS | Status: AC
  Filled 2021-01-18: qty 10

## 2021-01-18 MED ORDER — PANTOPRAZOLE SODIUM 40 MG IV SOLR
40.0000 mg | Freq: Two times a day (BID) | INTRAVENOUS | Status: DC
  Administered 2021-01-18 – 2021-01-19 (×3): 40 mg via INTRAVENOUS
  Filled 2021-01-18 (×3): qty 40

## 2021-01-18 MED ORDER — CHLORHEXIDINE GLUCONATE 0.12 % MT SOLN: 15.0000 mL | Freq: Once | OROMUCOSAL | Status: AC

## 2021-01-18 MED ORDER — DEXAMETHASONE SODIUM PHOSPHATE 10 MG/ML IJ SOLN
INTRAMUSCULAR | Status: DC | PRN
  Administered 2021-01-18: 10 mg via INTRAVENOUS

## 2021-01-18 MED ORDER — KETAMINE HCL 10 MG/ML IJ SOLN
INTRAMUSCULAR | Status: DC | PRN
  Administered 2021-01-18: 20 mg via INTRAVENOUS
  Administered 2021-01-18: 10 mg via INTRAVENOUS

## 2021-01-18 SURGICAL SUPPLY — 88 items
ADH SKN CLS APL DERMABOND .7 (GAUZE/BANDAGES/DRESSINGS) ×2
BLADE CLIPPER SURG (BLADE) ×3 IMPLANT
BLADE SURG 11 STRL SS (BLADE) ×3 IMPLANT
BUTTON OLYMPUS DEFENDO 5 PIECE (MISCELLANEOUS) ×3 IMPLANT
CANISTER SUCT 3000ML PPV (MISCELLANEOUS) ×6 IMPLANT
CANNULA REDUC XI 12-8 STAPL (CANNULA)
CANNULA REDUCER 12-8 DVNC XI (CANNULA) IMPLANT
CATH THORACIC 28FR (CATHETERS) IMPLANT
CNTNR URN SCR LID CUP LEK RST (MISCELLANEOUS) ×2 IMPLANT
CONT SPEC 4OZ STRL OR WHT (MISCELLANEOUS) ×6
COVER BACK TABLE 60X90IN (DRAPES) ×3 IMPLANT
DECANTER SPIKE VIAL GLASS SM (MISCELLANEOUS) ×3 IMPLANT
DEFOGGER SCOPE WARMER CLEARIFY (MISCELLANEOUS) ×3 IMPLANT
DERMABOND ADVANCED (GAUZE/BANDAGES/DRESSINGS) ×1
DERMABOND ADVANCED .7 DNX12 (GAUZE/BANDAGES/DRESSINGS) ×4 IMPLANT
DEVICE SUTURE ENDOST 10MM (ENDOMECHANICALS) IMPLANT
DRAIN PENROSE 1/2X12 LTX STRL (WOUND CARE) ×3 IMPLANT
DRAPE COLUMN DVNC XI (DISPOSABLE) ×8 IMPLANT
DRAPE CV SPLIT W-CLR ANES SCRN (DRAPES) ×3 IMPLANT
DRAPE DA VINCI XI COLUMN (DISPOSABLE) ×12
DRAPE INCISE IOBAN 66X45 STRL (DRAPES) IMPLANT
DRAPE ORTHO SPLIT 77X108 STRL (DRAPES) ×3
DRAPE SURG ORHT 6 SPLT 77X108 (DRAPES) ×2 IMPLANT
ELECT REM PT RETURN 9FT ADLT (ELECTROSURGICAL) ×3
ELECTRODE REM PT RTRN 9FT ADLT (ELECTROSURGICAL) ×2 IMPLANT
FELT TEFLON 1X6 (MISCELLANEOUS) IMPLANT
FORCEPS GRASP COMBO 8X230 (FORCEP) ×3 IMPLANT
GAUZE SPONGE 4X4 12PLY STRL (GAUZE/BANDAGES/DRESSINGS) ×3 IMPLANT
GLOVE BIO SURGEON STRL SZ7 (GLOVE) ×3 IMPLANT
GLOVE BIO SURGEON STRL SZ7.5 (GLOVE) ×7 IMPLANT
GOWN STRL REUS W/ TWL LRG LVL3 (GOWN DISPOSABLE) ×2 IMPLANT
GOWN STRL REUS W/ TWL XL LVL3 (GOWN DISPOSABLE) ×4 IMPLANT
GOWN STRL REUS W/TWL 2XL LVL3 (GOWN DISPOSABLE) ×3 IMPLANT
GOWN STRL REUS W/TWL LRG LVL3 (GOWN DISPOSABLE) ×9
GOWN STRL REUS W/TWL XL LVL3 (GOWN DISPOSABLE) ×12
GRASPER SUT TROCAR 14GX15 (MISCELLANEOUS) IMPLANT
HEMOSTAT SURGICEL 2X14 (HEMOSTASIS) IMPLANT
IV NS 1000ML (IV SOLUTION)
IV NS 1000ML BAXH (IV SOLUTION) IMPLANT
KIT BASIN OR (CUSTOM PROCEDURE TRAY) ×3 IMPLANT
KIT TURNOVER KIT B (KITS) ×3 IMPLANT
MARKER SKIN DUAL TIP RULER LAB (MISCELLANEOUS) ×4 IMPLANT
MATRIX GENTRIX 7.5X6 HIATAL (Tissue) ×1 IMPLANT
NDL 18GX1X1/2 (RX/OR ONLY) (NEEDLE) IMPLANT
NDL SCLEROTHERAPY 23X2X240 (NEEDLE) IMPLANT
NEEDLE 18GX1X1/2 (RX/OR ONLY) (NEEDLE) IMPLANT
NEEDLE HYPO 22GX1.5 SAFETY (NEEDLE) ×3 IMPLANT
NEEDLE SCLEROTHERAPY 23X2X240 (NEEDLE) IMPLANT
NS IRRIG 1000ML POUR BTL (IV SOLUTION) ×6 IMPLANT
OBTURATOR OPTICAL STANDARD 8MM (TROCAR) ×3
OBTURATOR OPTICAL STND 8 DVNC (TROCAR) ×2
OBTURATOR OPTICALSTD 8 DVNC (TROCAR) IMPLANT
OIL SILICONE PENTAX (PARTS (SERVICE/REPAIRS)) IMPLANT
PACK CHEST (CUSTOM PROCEDURE TRAY) ×3 IMPLANT
PACK UNIVERSAL I (CUSTOM PROCEDURE TRAY) ×3 IMPLANT
PAD ARMBOARD 7.5X6 YLW CONV (MISCELLANEOUS) ×6 IMPLANT
PORT ACCESS TROCAR AIRSEAL 12 (TROCAR) ×2 IMPLANT
PORT ACCESS TROCAR AIRSEAL 5M (TROCAR) ×1
SEAL CANN UNIV 5-8 DVNC XI (MISCELLANEOUS) ×8 IMPLANT
SEAL XI 5MM-8MM UNIVERSAL (MISCELLANEOUS) ×12
SEALER SYNCHRO 8 IS4000 DV (MISCELLANEOUS) ×3
SEALER SYNCHRO 8 IS4000 DVNC (MISCELLANEOUS) IMPLANT
SET TRI-LUMEN FLTR TB AIRSEAL (TUBING) ×3 IMPLANT
SHEET MEDIUM DRAPE 40X70 STRL (DRAPES) ×3 IMPLANT
STAPLER CANNULA SEAL DVNC XI (STAPLE) IMPLANT
STAPLER CANNULA SEAL XI (STAPLE)
SUT ETHIBOND 0 36 GRN (SUTURE) ×7 IMPLANT
SUT SILK  1 MH (SUTURE)
SUT SILK 1 MH (SUTURE) ×2 IMPLANT
SUT SURGIDAC NAB ES-9 0 48 120 (SUTURE) IMPLANT
SUT VIC AB 3-0 SH 27 (SUTURE) ×6
SUT VIC AB 3-0 SH 27X BRD (SUTURE) ×4 IMPLANT
SUT VIC AB 3-0 X1 27 (SUTURE) ×6 IMPLANT
SUT VICRYL 0 UR6 27IN ABS (SUTURE) ×6 IMPLANT
SYR 10ML LL (SYRINGE) IMPLANT
SYR 20CC LL (SYRINGE) ×6 IMPLANT
SYR 20ML ECCENTRIC (SYRINGE) ×3 IMPLANT
SYR 30ML SLIP (SYRINGE) ×3 IMPLANT
SYSTEM SAHARA CHEST DRAIN ATS (WOUND CARE) IMPLANT
TOWEL GREEN STERILE (TOWEL DISPOSABLE) ×3 IMPLANT
TOWEL GREEN STERILE FF (TOWEL DISPOSABLE) ×3 IMPLANT
TRAY FOLEY MTR SLVR 16FR STAT (SET/KITS/TRAYS/PACK) ×3 IMPLANT
TROCAR XCEL BLADELESS 5X75MML (TROCAR) IMPLANT
TROCAR XCEL NON-BLD 5MMX100MML (ENDOMECHANICALS) IMPLANT
TUBE CONNECTING 20X1/4 (TUBING) ×3 IMPLANT
TUBING ENDO SMARTCAP (MISCELLANEOUS) ×3 IMPLANT
UNDERPAD 30X36 HEAVY ABSORB (UNDERPADS AND DIAPERS) ×3 IMPLANT
WATER STERILE IRR 1000ML POUR (IV SOLUTION) ×3 IMPLANT

## 2021-01-18 NOTE — Transfer of Care (Signed)
Immediate Anesthesia Transfer of Care Note  Patient: [name]  Procedure(s) Performed: XI ROBOTIC ASSISTED PARAESOPHAGEAL HERNIA REPAIR WITH FUNDOPLICATION WITH MESH (N/A Chest) ESOPHAGOGASTRODUODENOSCOPY (EGD) (N/A Abdomen)  Patient Location: PACU  Anesthesia Type:General  Level of Consciousness: sedated  Airway & Oxygen Therapy: Patient connected to face mask oxygen  Post-op Assessment: Post -op Vital signs reviewed and stable  Post vital signs: stable  Last Vitals:  Vitals Value Taken Time  BP 158/73 01/18/21 1102  Temp    Pulse 88 01/18/21 1109  Resp 15 01/18/21 1109  SpO2 94 % 01/18/21 1109  Vitals shown include unvalidated device data.  Last Pain:  Vitals:   01/18/21 1102  TempSrc:   PainSc: Asleep      Patients Stated Pain Goal: 3 (12/14/20 4825)  Complications: No complications documented.

## 2021-01-18 NOTE — Plan of Care (Signed)
  Problem: Clinical Measurements: Goal: Will remain free from infection Outcome: Progressing Goal: Diagnostic test results will improve Outcome: Progressing Goal: Respiratory complications will improve Outcome: Progressing Goal: Cardiovascular complication will be avoided Outcome: Progressing   Problem: Coping: Goal: Level of anxiety will decrease Outcome: Progressing   Problem: Elimination: Goal: Will not experience complications related to bowel motility Outcome: Progressing Goal: Will not experience complications related to urinary retention Outcome: Progressing   Problem: Pain Managment: Goal: General experience of comfort will improve Outcome: Progressing   Problem: Safety: Goal: Ability to remain free from injury will improve Outcome: Progressing   Problem: Skin Integrity: Goal: Risk for impaired skin integrity will decrease Outcome: Progressing   Problem: Clinical Measurements: Goal: Postoperative complications will be avoided or minimized Outcome: Progressing   Problem: Skin Integrity: Goal: Demonstration of wound healing without infection will improve Outcome: Progressing   Problem: Education: Goal: Knowledge of General Education information will improve Description: Including pain rating scale, medication(s)/side effects and non-pharmacologic comfort measures Outcome: Not Progressing   Problem: Health Behavior/Discharge Planning: Goal: Ability to manage health-related needs will improve Outcome: Not Progressing   Problem: Clinical Measurements: Goal: Ability to maintain clinical measurements within normal limits will improve Outcome: Not Progressing   Problem: Activity: Goal: Risk for activity intolerance will decrease Outcome: Not Progressing   Problem: Nutrition: Goal: Adequate nutrition will be maintained Outcome: Not Progressing   Problem: Education: Goal: Required Educational Video(s) Outcome: Not Progressing

## 2021-01-18 NOTE — Anesthesia Procedure Notes (Signed)
Procedure Name: Intubation Date/Time: 01/18/2021 7:53 AM Performed by: Lavell Luster, CRNA Pre-anesthesia Checklist: Patient identified, Suction available, Emergency Drugs available, Patient being monitored and Timeout performed Patient Re-evaluated:Patient Re-evaluated prior to induction Oxygen Delivery Method: Circle system utilized Preoxygenation: Pre-oxygenation with 100% oxygen Induction Type: IV induction Ventilation: Mask ventilation without difficulty Laryngoscope Size: Mac and 3 Grade View: Grade I Tube type: Oral Tube size: 7.5 mm Number of attempts: 1 Airway Equipment and Method: Stylet Placement Confirmation: ETT inserted through vocal cords under direct vision,  positive ETCO2 and breath sounds checked- equal and bilateral Secured at: 20 cm Tube secured with: Tape Dental Injury: Teeth and Oropharynx as per pre-operative assessment

## 2021-01-18 NOTE — Anesthesia Procedure Notes (Signed)
Arterial Line Insertion Start/End4/25/2022 7:15 AM, 01/18/2021 7:25 AM Performed by: Catalina Gravel, MD, anesthesiologist  Patient location: Pre-op. Preanesthetic checklist: patient identified, IV checked, site marked, risks and benefits discussed, surgical consent, monitors and equipment checked, pre-op evaluation, timeout performed and anesthesia consent Lidocaine 1% used for infiltration Right, radial was placed Catheter size: 20 Fr Hand hygiene performed  and maximum sterile barriers used   Attempts: 1 Procedure performed without using ultrasound guided technique. Following insertion, dressing applied and Biopatch. Post procedure assessment: normal and unchanged  Patient tolerated the procedure well with no immediate complications. Additional procedure comments: Attempt x2 by CRNA on left wrist, unable to pass wire.  Attempt x1 by MDA on right wrist with success.Marland Kitchen

## 2021-01-18 NOTE — Brief Op Note (Signed)
01/18/2021  10:40 AM  PATIENT:  [name]  70 y.o. female  PRE-OPERATIVE DIAGNOSIS:  HIATAL HERNIA  POST-OPERATIVE DIAGNOSIS:  HIATAL HERNIA  PROCEDURE: ESOPHAGOGASTRODUODENOSCOPY (EGD),  XI ROBOTIC ASSISTED PARAESOPHAGEAL HERNIA REPAIR WITH FUNDOPLICATION WITH MESH   SURGEON:  Surgeon(s) and Role:    Lightfoot, Lucile Crater, MD - Primary  PHYSICIAN ASSISTANT: Lars Pinks PA-C  ANESTHESIA:   general  EBL:  Per anesthesia  BLOOD ADMINISTERED:none  DRAINS: none   LOCAL MEDICATIONS USED:  OTHER Exparel  COUNTS CORRECT:  YES  DICTATION: .Dragon Dictation  PLAN OF CARE: Admit to inpatient   PATIENT DISPOSITION:  PACU - hemodynamically stable.   Delay start of Pharmacological VTE agent (>24hrs) due to surgical blood loss or risk of bleeding: no

## 2021-01-18 NOTE — Discharge Summary (Signed)
Physician Discharge Summary       [address]       [name],[place] [id number]             [phone]    Patient ID: [name] MRN: [id number] DOB/AGE: 70-Jun-1952 70 y.o.  Admit date: 01/18/2021 Discharge date: 01/19/2021  Admission Diagnoses: Hiatal hernia  Discharge Diagnoses:  1. S/p robotic assisted repair of para esophageal hernia with mesh and fundoplication and EGD 2. History of the following: Abdominal bruit 04/2017    Aortic u/s showed NO ANEURISM.  Marland Kitchen. Allergy    SEASONAL  . Anxiety   . BCC (basal cell carcinoma), arm, right   . Carotid artery occlusion   . Cataract   . Colon cancer screening 05/2017   Colonoscopy reportedly normal approx 2008. Cologuard NEG--repeat 3 yrs.  Marland Kitchen. COPD (chronic obstructive pulmonary disease) (HCC)    DOE, worse since 11/2018. Stress test neg 01/2019. Albut x 1 inh prn helpful some. Pulm 06/2019, PFTs not diag of COPD, DOE likely from deconditioning.  . Essential hypertension, benign 05/22/2014  . GERD (gastroesophageal reflux disease)   . H/O hiatal hernia 02/2019   EGD-->moderate size->surgery recommended but pt declined.  . Hematochezia 2020/21   internal hemorrhoids  . History of iron deficiency anemia 07/2019   Dr. Erlene Quanirigliano->EGD and colonoscopy, some anorectal muscosal ulceration, o/w just mild chronic gastritis. Also could be poor absorption due to chronic high dose acid suppression. Hb/iron normalized aft iron infus and ongoing oral iron.  . Melanoma (HCC)   . Mitral valve prolapse   . Mixed hyperlipidemia 05/22/2014  . OAB (overactive bladder)    detrol LA from urol helpful  . Osteopenia 06/06/2016   05/2016 DEXA T-score -1.5. 06/2018 DEXA T score -1.1  . Peripheral vascular disease, unspecified (HCC) 05/22/2014   innominate and carotid. U/S f/u 12/2016 showed no signif change compared to 2017--vascular recommended repeat 1 yr. Doing well as of 08/2018 cardiol f/u-->continued on ASA  and Plavix. F/u 08/2019->ASA d/c'd, plavix continued.  . Prediabetes 2018   2018 A1c 6.3%. 05/2018 A1c 6.1%. 07/2019 A1c 6.4%.  Marland Kitchen. Reflux esophagitis   . Stroke [facility](HCC) 2006   Mini Stroke- not major  . Tobacco abuse, in remission    quit 2009     Consults: None  Procedure (s):  Esophagoscopy - Robotic assisted laparoscopy - Paraesophageal hernia repair with ACell mesh -Dor fundoplication by Dr. Cliffton AstersLightfoot on 01/18/2021.  History of Presenting Illness: [name]69 y.o.femalereferred by Dr. Tonia BroomsIcard for surgical evaluation of a moderate size hiatal hernia. The patient is very symptomatic from an aerodigestive standpoint. She states that she is short of breath after meals, and is only able to tolerate small portions at this point. She occasionally has some dysphagia. She has significant reflux and is unable to come off of any of her medications. She states that occasionally when she leans forward she also has some regurgitant gastric content. She has been awoken from sleep on several occasions with a feeling of choking thus at this point she no longer eats after 5 PM. She denies any odynophagia. Her weight has been stable.  In regards to her shortness of breath she is concerned that this may be related to her COPD but does note that even after drinking fluids she is more short of breath.  Dr. Cliffton AstersLightfoot reviewed the patient's CT scan from 2020, and she has a good portion of stomach sitting behind her heart and her posterior mediastinum. She also has  some element of COPD, but based on her symptoms I question whether or not her respiratory symptoms is due to her hiatal hernia, and potential silent aspiration. We discussed the risks and benefits of an EGD followed by a robotic assisted hiatal hernia repair with fundoplication. Given her degree of reflux, Dr. Kipp Brood though that she should at least get a partial wrap. Potential risks, benefits, and complications of the robotic  assisted surgery were discussed with the patient and she agreed to proceed with surgery. She underwent an EGD, XI robotic assisted paraesophageal hernia repair with mesh and fundoplication on 52/77/8242.   Brief Hospital Course:  Patient was extubated successfully following surgery. CXR showed  small bilateral pneumothoraces with pneumomediastinum and extensive soft tissue gas in the chest walls and cervical regions Bilaterally. She had some subcutanenous emphysema bilateral neck and upper chest, face, and left eye. She had no nausea or vomiting. Swallow study showed status post hiatal hernia repair with mild narrowing restriction just below GE junction. However, contrast freely flows into the stomach. No residual hiatal hernia. No extraluminal contrast to suggest a leak. We discussed her diet (she may eat foods that are consistent with applesauce, NO carbonated beverages). Both Zofran ODT and Hycocet liquid PRN pain prescriptions were sent to her pharmacy. She was surgically stable for discharge.  Latest Vital Signs: Blood pressure (!) 98/49, pulse 73, temperature 97.6 F (36.4 C), temperature source Oral, resp. rate 17, height 5' 4.5" (1.638 m), weight 60.8 kg, SpO2 96 %.  Physical Exam: Cardiovascular: RRR Pulmonary: Clear to auscultation bilaterally. Subcutaneous emphysema bilateral neck and upper chest, face, and left eye. Abdomen: Soft, non tender, bowel sounds present. Extremities: No lower extremity edema. Wounds: Clean and dry.  No erythema or signs of infection.  Discharge Condition: Stable and discharged to home.  Recent laboratory studies:  Lab Results  Component Value Date   WBC 10.9 (H) 01/19/2021   HGB 13.4 01/19/2021   HCT 40.2 01/19/2021   MCV 93.1 01/19/2021   PLT 215 01/19/2021   Lab Results  Component Value Date   NA 138 01/19/2021   K 4.6 01/19/2021   CL 106 01/19/2021   CO2 25 01/19/2021   CREATININE 0.79 01/19/2021   GLUCOSE 118 (H) 01/19/2021       Diagnostic Studies: DG Chest 2 View  Result Date: 01/18/2021 CLINICAL DATA:  Preop evaluation.  Hiatal hernia surgery today. EXAM: CHEST - 2 VIEW COMPARISON:  Chest x-ray 05/15/2020, CT heart 12/21/2020, CT chest 06/30/2009 FINDINGS: The heart size and mediastinal contours are unchanged. Aortic arch calcification. Vascular stents noted at the level of the carina. Hyperinflation of the lungs consistent with emphysematous changes. Biapical pleural/pulmonary scarring. Bibasilar airspace opacities. No focal consolidation. No pulmonary edema. No pleural effusion. No pneumothorax. No acute osseous abnormality. Right upper quadrant cholecystectomy clips. IMPRESSION: 1. Bibasilar airspace opacities likely represent atelectasis. 2. Aortic Atherosclerosis (ICD10-I70.0) and Emphysema (ICD10-J43.9). Electronically Signed   By: Iven Finn M.D.   On: 01/18/2021 06:42   CT CORONARY MORPH W/CTA COR W/SCORE W/CA W/CM &/OR WO/CM  Addendum Date: 12/22/2020   ADDENDUM REPORT: 12/22/2020 08:28 ADDENDUM: OVER-READ INTERPRETATION  CT CHEST The following report is an over-read performed by radiologist Dr. Forest Gleason [facility] [place] on 12/22/2020. This over-read does not include interpretation of cardiac or coronary anatomy or pathology. The coronary CTA interpretation by the cardiologist is attached. COMPARISON: 05/15/2020 chest radiograph.  Chest CT 07/01/2019. FINDINGS: Cardiovascular: Aortic atherosclerosis. Tortuous thoracic aorta. No central pulmonary embolism, on this non-dedicated  study. Mediastinum/Nodes: No imaged thoracic adenopathy. Moderate hiatal hernia Lungs/Pleura: No pleural fluid.  Moderate centrilobular emphysema. Upper Abdomen: Normal imaged portions of the liver, spleen. Musculoskeletal: No acute osseous abnormality. IMPRESSION: 1.  No acute findings in the imaged extracardiac chest. 2. Moderate hiatal hernia. 3. Aortic Atherosclerosis (ICD10-I70.0) and Emphysema (ICD10-J43.9). Electronically  Signed   By: Abigail Miyamoto M.D.   On: 12/22/2020 08:28   Result Date: 12/22/2020 CLINICAL DATA:  Shortness of breath, 70 year old female EXAM: Cardiac/Coronary CTA TECHNIQUE: A non-contrast, gated CT scan was obtained with axial slices of 3 mm through the heart for calcium scoring. Calcium scoring was performed using the Agatston method. A 120 kV prospective, gated, contrast cardiac scan was obtained. Gantry rotation speed was 250 msecs and collimation was 0.6 mm. Two sublingual nitroglycerin tablets (0.8 mg) were given. The 3D data set was reconstructed in 5% intervals of the 35-75% of the R-R cycle. Diastolic phases were analyzed on a dedicated workstation using MPR, MIP, and VRT modes. The patient received 80 cc of contrast. FINDINGS: Image quality: Moderate. Noise artifact is: Limited to calcification blooming Coronary Arteries:  Normal coronary origin.  Right dominance. Left main: The left main is a large caliber vessel with a normal take off from the left coronary cusp that bifurcates to form a left anterior descending artery and a left circumflex artery. There is calcified plaque, 0-24% stenosis. Left anterior descending artery: There is diffuse dense calcified proximal plaque, 25-49% stenosis. The LAD gives off 2 patent diagonal small caliber branches. Left circumflex artery: The LCX is non-dominant, small caliber with no evidence of plaque or stenosis. Poorly filled vessel. the LCX gives off 1 small obtuse marginal branch. Right coronary artery: The RCA is dominant with normal take off from the right coronary cusp. There is diffuse ostial to distal calcified plaque, ectatic vessel with moderate 50-69%. Sending for FFR. the RCA terminates as a PDA and right posterolateral branch without evidence of plaque or stenosis. Right Atrium: Right atrial size is within normal limits. Right Ventricle: The right ventricular cavity is within normal limits. Left Atrium: Left atrial size is normal in size with no left  atrial appendage filling defect. Left Ventricle: The ventricular cavity size is within normal limits. Pulmonary arteries: Normal in size without proximal filling defect. Pulmonary veins: Normal pulmonary venous drainage. Pericardium: Normal thickness with no significant effusion or calcium present. Cardiac valves: The aortic valve is trileaflet without significant calcification. The mitral valve is normal structure with moderate mitral annular calcification. Aorta: Normal caliber with mild atherosclerosis. Extra-cardiac findings: See attached radiology report for non-cardiac structures. IMPRESSION: 1. Coronary calcium score of 907. This was 48 percentile for age and sex matched controls. 2. Normal coronary origin with right dominance. 3. Diffuse calcified plaque, possibly moderate in RCA, mild in left main, LAD. Sending for FFR. 4.  Aortic atherosclerosis. 5.  Mitral annular calcification. Electronically Signed: By: Candee Furbish MD On: 12/21/2020 16:28   DG Chest Port 1 View  Result Date: 01/18/2021 CLINICAL DATA:  70 year old female with history of pneumothorax. EXAM: PORTABLE CHEST 1 VIEW COMPARISON:  Chest x-ray 01/18/2021. FINDINGS: Small bilateral pneumothoraces are noted occupying approximately 10% of the volume of the thorax. Diffuse interstitial prominence throughout the lungs bilaterally. No definite confluent consolidative airspace disease. No pleural effusions. Heart size is upper limits of normal. Mediastinal contours are distorted by patient's rotation to the right. Atherosclerotic calcifications in the thoracic aorta. Vascular stents are noted in the arch vessels. Extensive soft tissue  gas is noted in the cervical regions bilaterally and in the chest wall bilaterally. Small volume of pneumomediastinum. Surgical clips project over the right upper quadrant of the abdomen, likely from prior cholecystectomy. IMPRESSION: 1. Small bilateral pneumothoraces with pneumomediastinum and extensive soft tissue  gas in the chest walls and cervical regions bilaterally. 2. Diffuse interstitial prominence is of uncertain etiology and significance. This could reflect recent aspiration. Attention on follow-up studies is recommended. 3. Aortic atherosclerosis. These results will be called to the ordering clinician or representative by the Radiologist Assistant, and communication documented in the PACS or [employer]. Electronically Signed   By: Vinnie Langton M.D.   On: 01/18/2021 12:34   CT CORONARY FRACTIONAL FLOW RESERVE DATA PREP  Result Date: 12/21/2020 EXAM: CT FFR ANALYSIS FINDINGS: CT FFR analysis was performed on the original cardiac computed tomography angiogram dataset. Diagrammatic representation of the CT FFR analysis is provided in a separate PDF document in PACS. This dictation was created using the PDF document and an interactive 3D model of the results. The 3D model is not available in the EMR/PACS. Normal CT FFR range is >0.80. 1. Left Main: No significant stenosis. 2. LAD: No significant stenosis.  0.96, 0.88, 0.82 3. LCX: No significant stenosis.  0.96 4. RCA: No significant stenosis.  0.98, 0.85, 0.92 IMPRESSION: 1.  CT FFR analysis didn't show any significant stenosis. Electronically Signed   By: Candee Furbish MD   On: 12/21/2020 21:30   DG ESOPHAGUS W SINGLE CM (SOL OR THIN BA)  Result Date: 01/19/2021 CLINICAL DATA:  Status post hernia surgery with mesh repair yesterday EXAM: ESOPHOGRAM/BARIUM SWALLOW TECHNIQUE: Single contrast examination was performed using  water-soluble. FLUOROSCOPY TIME:  Fluoroscopy Time:  54 seconds Radiation Exposure Index (if provided by the fluoroscopic device): 9.3 Number of Acquired Spot Images: 4 COMPARISON:  Esophagram dated 03/28/2019 FINDINGS: Status post hiatal hernia repair with mild narrowing restriction just below GE junction. However, contrast freely flows into the stomach. No residual hiatal hernia. No extraluminal contrast to suggest a leak. Mild  esophageal dysmotility. Visualized stomach is unremarkable. IMPRESSION: Postsurgical changes related to hiatal hernia repair, without residual hiatal hernia or leak. Electronically Signed   By: Julian Hy M.D.   On: 01/19/2021 11:12     Discharge Medications: Allergies as of 01/19/2021      Reactions   Doxycycline Shortness Of Breath, Swelling   Lips swelling and short of breath   Fentanyl Nausea And Vomiting   Pt states all pain medications cause N/V for her Pt states all pain medications cause N/V for her   Fluocinolone Nausea And Vomiting   Meperidine Nausea And Vomiting   Penicillins Hives   Breo Ellipta [fluticasone Furoate-vilanterol] Other (See Comments)   Lower extremity weakness, lightheadedness    Imdur [isosorbide Nitrate] Nausea And Vomiting      Medication List    TAKE these medications   albuterol 108 (90 Base) MCG/ACT inhaler Commonly known as: VENTOLIN HFA Inhale 2 puffs into the lungs every 6 (six) hours as needed for wheezing or shortness of breath.   atorvastatin 80 MG tablet Commonly known as: LIPITOR TAKE ONE TABLET BY MOUTH DAILY   cholecalciferol 1000 units tablet Commonly known as: VITAMIN D Take 1,000 Units by mouth daily.   clopidogrel 75 MG tablet Commonly known as: PLAVIX TAKE ONE TABLET BY MOUTH DAILY   diazepam 2 MG tablet Commonly known as: VALIUM Take 1 tablet (2 mg total) by mouth 2 (two) times daily.   estradiol  0.1 MG/GM vaginal cream Commonly known as: ESTRACE Place 1 Applicatorful vaginally 2 (two) times a week.   famotidine 40 MG tablet Commonly known as: PEPCID TAKE ONE TABLET BY MOUTH DAILY WITH LUNCH   ferrous sulfate 325 (65 FE) MG tablet Take 325 mg by mouth daily.   fexofenadine 180 MG tablet Commonly known as: ALLEGRA Take 180 mg by mouth daily. Take 1 tablet daily for allergies   Fish Oil 1000 MG Caps Take 2,000 mg by mouth daily.   fluticasone 50 MCG/ACT nasal spray Commonly known as: FLONASE Place 2  sprays into both nostrils daily. What changed: when to take this   HYDROcodone-acetaminophen 7.5-325 mg/15 ml solution Commonly known as: HYCET Take 10 mLs by mouth 4 (four) times daily as needed for severe pain.   lisinopril 5 MG tablet Commonly known as: ZESTRIL TAKE ONE TABLET BY MOUTH DAILY   Magnesium 500 MG Caps Take 500 mg by mouth daily.   nitroGLYCERIN 0.4 MG SL tablet Commonly known as: NITROSTAT Place 1 tablet (0.4 mg total) under the tongue every 5 (five) minutes as needed for chest pain.   ondansetron 4 MG disintegrating tablet Commonly known as: Zofran ODT Take 1 tablet (4 mg total) by mouth every 8 (eight) hours as needed for nausea or vomiting.   pantoprazole 40 MG tablet Commonly known as: PROTONIX TAKE ONE TABLET BY MOUTH TWICE DAILY What changed: when to take this   PROBIOTIC DAILY PO Take 1 capsule by mouth daily.   Spiriva Respimat 2.5 MCG/ACT Aers Generic drug: Tiotropium Bromide Monohydrate Inhale 2 puffs into the lungs daily. What changed:   when to take this  reasons to take this   tolterodine 4 MG 24 hr capsule Commonly known as: DETROL LA Take 4 mg by mouth daily.       Follow Up Appointments:  Follow-up Information    [name], MD Follow up on 01/29/2021.   Specialty: Cardiothoracic Surgery Why: This appointment is VIRTUAL, please do not come to the office. Appointment time is at 4:40 pm Contact information: [place] [phone]               Signed: Sharalyn Ink [facility] 01/19/2021, 11:47 AM

## 2021-01-18 NOTE — H&P (Signed)
[address]       [place] [address]             [phone]        No events since her primary appointment Vitals:   01/18/21 0613  BP: (!) 143/86  Pulse: 77  Resp: 18  Temp: 97.9 F (36.6 C)  SpO2: 99%   Alert NAD Sinus EWOB  OR today for EGD, Robotic assisted PEH repair with fundoplication  Per my primary note.       [address]       [place], [address]             [phone]                                                   [name] Medical Record #[id number] Date of Birth: 1950-11-05  Referring: Garner Nash, DO Primary Care: Tammi Sou, MD Primary Cardiologist: Larae Grooms, MD  Chief Complaint:        Chief Complaint  Patient presents with  . Hiatal Hernia    Surgical consult    History of Present Illness:    [name] 70 y.o. female referred by Dr. Valeta Harms for surgical evaluation of a moderate size hiatal hernia.  The patient is very symptomatic from an aerodigestive standpoint.  She states that she is short of breath after meals, and is only able to tolerate small portions at this point.  She occasionally has some dysphagia.  She has significant reflux and is unable to come off of any of her medications.  She states that occasionally when she leans forward she also has some regurgitant gastric content.  She has been awoken from sleep on several occasions with a feeling of choking thus at this point she no longer eats after 5 PM.  She denies any odynophagia.  Her weight has been stable.  In regards to her shortness of breath she is concerned that this may be related to her COPD but does note that even after drinking fluids she is more short of breath.        Past Medical History:  Diagnosis Date  . Abdominal bruit 04/2017   Aortic u/s showed NO ANEURISM.  Marland Kitchen Allergy    SEASONAL  . Anxiety   . BCC (basal cell carcinoma), arm, right   . Carotid artery  occlusion   . Cataract   . Colon cancer screening 05/2017   Colonoscopy reportedly normal approx 2008.  Cologuard NEG--repeat 3 yrs.  Marland Kitchen COPD (chronic obstructive pulmonary disease) ([place])    DOE, worse since 11/2018.  Stress test neg 01/2019.  Albut x 1 inh prn helpful some.  Pulm 06/2019, PFTs not diag of COPD, DOE likely from deconditioning.  . Essential hypertension, benign 05/22/2014  . GERD (gastroesophageal reflux disease)   . H/O hiatal hernia 02/2019   EGD-->moderate size->surgery recommended but pt declined.  . Hematochezia 2020/21   internal hemorrhoids  . History of iron deficiency anemia 07/2019   Dr. Bryan Lemma EGD and colonoscopy, some anorectal muscosal ulceration, o/w just mild chronic gastritis.   Also could be poor absorption due to chronic high dose acid suppression. Hb/iron normalized aft iron infus and ongoing oral iron.  . Melanoma ([place])   . Mitral valve prolapse   .  Mixed hyperlipidemia 05/22/2014  . OAB (overactive bladder)    detrol LA from urol helpful  . Osteopenia 06/06/2016   05/2016 DEXA T-score -1.5.  06/2018 DEXA T score -1.1  . Peripheral vascular disease, unspecified ([place]) 05/22/2014   innominate and carotid.  U/S f/u 12/2016 showed no signif change compared to 2017--vascular recommended repeat 1 yr.  Doing well as of 08/2018 cardiol f/u-->continued on ASA and Plavix. F/u 08/2019->ASA d/c'd, plavix continued.  . Prediabetes 2018   2018 A1c 6.3%.  05/2018 A1c 6.1%. 07/2019 A1c 6.4%.  Marland Kitchen Reflux esophagitis   . Stroke [facility]) 2006   Mini  Stroke- not major  . Tobacco abuse, in remission    quit 2009         Past Surgical History:  Procedure Laterality Date  . ABDOMINAL HYSTERECTOMY  1997  . CARDIAC CATHETERIZATION  01/2012   NORMAL  . CARDIOVASCULAR STRESS TEST  02/05/2019   NORMAL.  EF normal.  . CAROTID ENDARTERECTOMY  08/21/2007   right - Dr. Amedeo Plenty  . CHOLECYSTECTOMY  2000  . COLONOSCOPY  approx 2008; 09/23/19   2008  (Dr. Lenon Ahmadi pt->normal.  08/2019 adenoma, some friable areas of colon + anorectal mucosa inflam w/ ulceration, +diverticulosis. Recall 5 yrs  . DEXA  06/06/2016   05/2016 T-score -1.5.  06/2018 T score -1.1.  Repeat 2 yrs.  . ESOPHAGOGASTRODUODENOSCOPY  03/11/2019; 09/23/19   02/2019; 5 cm hiatal hernia; GI suggested surgical repair due to intractable GER.  08/2019 mild chronic gastritis, h pylori neg, multiple benign bx's including duodenum neg for celiac changes.  . Incision and drainage of left thenar space abscess  02/23/15   s/p cat bite (Dr. Amedeo Plenty)  . innominate stent  2007   12/2014 u/s showed patent stent in innominate and L common carotid.  L prox int carotid 40-59% stenosis  . LDCT for lung ca screening  06/2019   NEG->rpt 1 yr  . Left common carotid stent  2006 and 2007   "                    "                  "                          "                             "  . TRANSTHORACIC ECHOCARDIOGRAM  2007   NORMAL  . US CAROTID DOPPLER BILATERAL ([place] [facility])  06/2019   left stent w/out stenosis.  R ICA sten 40-50% obst-stable. Rpt 1 yr.         Family History  Problem Relation Age of Onset  . Heart attack Mother   . CAD Mother   . Heart disease Mother        Before age 50  . Hypertension Mother   . Hyperlipidemia Mother   . Hypertension Father   . Pneumonia Father   . Hyperlipidemia Father   . Heart murmur Sister   . Osteoporosis Paternal Grandmother   . CAD Paternal Grandfather   . Breast cancer Neg Hx   . Colon cancer Neg Hx   . Esophageal cancer Neg Hx   . Rectal cancer Neg Hx   . Stomach cancer Neg Hx   . Colon polyps Neg Hx  Social History       Tobacco Use  Smoking Status Former Smoker  . Types: Cigarettes  . Quit date: 09/27/2007  . Years since quitting: 13.1  Smokeless Tobacco Never Used    Social History      Substance and Sexual Activity  Alcohol Use No          Allergies  Allergen  Reactions  . Doxycycline Shortness Of Breath and Swelling    Lips swelling and short of breath  . Fentanyl Nausea And Vomiting    Pt states all pain medications cause N/V for her Pt states all pain medications cause N/V for her  . Fluocinolone Nausea And Vomiting  . Meperidine Nausea And Vomiting  . Penicillins Hives  . Breo Ellipta [Fluticasone Furoate-Vilanterol] Other (See Comments)    Lower extremity weakness, lightheadedness   . Imdur [Isosorbide Nitrate] Nausea And Vomiting          Current Outpatient Medications  Medication Sig Dispense Refill  . albuterol (VENTOLIN HFA) 108 (90 Base) MCG/ACT inhaler Inhale 2 puffs into the lungs every 6 (six) hours as needed for wheezing or shortness of breath. 8 g 6  . atorvastatin (LIPITOR) 80 MG tablet TAKE ONE TABLET BY MOUTH DAILY 30 tablet 5  . cholecalciferol (VITAMIN D) 1000 UNITS tablet Take 1,000 Units by mouth daily.    . clopidogrel (PLAVIX) 75 MG tablet TAKE ONE TABLET BY MOUTH DAILY 90 tablet 1  . diazepam (VALIUM) 2 MG tablet Take 1 tablet (2 mg total) by mouth 2 (two) times daily. 60 tablet 5  . estradiol (ESTRACE) 0.1 MG/GM vaginal cream SMARTSIG:Topical    . famotidine (PEPCID) 40 MG tablet TAKE ONE TABLET BY MOUTH DAILY WITH LUNCH 90 tablet 1  . ferrous sulfate 325 (65 FE) MG tablet Take 325 mg by mouth daily.     . fexofenadine (ALLEGRA) 180 MG tablet Take 180 mg by mouth daily. Take 1 tablet daily for allergies    . fluticasone (FLONASE) 50 MCG/ACT nasal spray Place 2 sprays into both nostrils daily. 16 g 6  . lisinopril (ZESTRIL) 5 MG tablet TAKE ONE TABLET BY MOUTH DAILY 90 tablet 1  . Magnesium 500 MG CAPS Take by mouth.    . nitroGLYCERIN (NITROSTAT) 0.4 MG SL tablet Place 1 tablet (0.4 mg total) under the tongue every 5 (five) minutes as needed for chest pain. 25 tablet 6  . Omega-3 Fatty Acids (FISH OIL) 1000 MG CAPS Take 2,000 mg by mouth daily.     . pantoprazole (PROTONIX) 40 MG tablet TAKE ONE  TABLET BY MOUTH TWICE DAILY 180 tablet 1  . tiotropium (SPIRIVA HANDIHALER) 18 MCG inhalation capsule Place 1 capsule (18 mcg total) into inhaler and inhale daily. (Patient not taking: Reported on 07/31/2020) 30 capsule 11  . Tiotropium Bromide Monohydrate (SPIRIVA RESPIMAT) 2.5 MCG/ACT AERS Inhale 2 puffs into the lungs daily. 4 g 11  . Tiotropium Bromide Monohydrate (SPIRIVA RESPIMAT) 2.5 MCG/ACT AERS Inhale 2 puffs into the lungs daily. 8 g 0  . tolterodine (DETROL LA) 4 MG 24 hr capsule Take 4 mg by mouth daily.     No current facility-administered medications for this visit.    Review of Systems  Constitutional: Negative for malaise/fatigue and weight loss.  Respiratory: Positive for cough, sputum production and shortness of breath.   Cardiovascular: Negative for chest pain.  Gastrointestinal: Positive for abdominal pain, heartburn and nausea.  Musculoskeletal: Negative.   Neurological: Negative.     PHYSICAL EXAMINATION: BP  122/78   Pulse 90   Temp 97.6 F (36.4 C) (Skin)   Resp 20   Ht 5\' 4"  (1.626 m)   Wt 135 lb (61.2 kg)   SpO2 95% Comment: RA  BMI 23.17 kg/m   Physical Exam Constitutional:      General: She is not in acute distress.    Appearance: Normal appearance. She is normal weight. She is not ill-appearing.  HENT:     Head: Normocephalic and atraumatic.  Eyes:     Extraocular Movements: Extraocular movements intact.  Cardiovascular:     Rate and Rhythm: Normal rate.  Pulmonary:     Effort: Pulmonary effort is normal. No respiratory distress.  Abdominal:     General: Abdomen is flat. There is no distension.  Musculoskeletal:     Cervical back: Normal range of motion.  Skin:    General: Skin is warm and dry.  Neurological:     General: No focal deficit present.     Mental Status: She is alert and oriented to person, place, and time.      Diagnostic Studies & Laboratory data:     Recent Radiology Findings:    Imaging Results  No results  found.       I have independently reviewed the above radiology studies  and reviewed the findings with the patient.   Recent Lab Findings: Recent Labs       Lab Results  Component Value Date   WBC 7.5 07/31/2020   HGB 16.0 (H) 07/31/2020   HCT 47.9 (H) 07/31/2020   PLT 350.0 07/31/2020   GLUCOSE 83 07/31/2020   CHOL 173 07/31/2020   TRIG 172.0 (H) 07/31/2020   HDL 55.60 07/31/2020   LDLDIRECT 74.0 11/28/2017   LDLCALC 83 07/31/2020   ALT 34 07/31/2020   AST 27 07/31/2020   NA 138 07/31/2020   K 4.1 07/31/2020   CL 98 07/31/2020   CREATININE 0.75 07/31/2020   BUN 14 07/31/2020   CO2 31 07/31/2020   TSH 0.84 07/31/2020   INR 0.9 08/17/2007   HGBA1C 6.0 07/31/2020       Assessment / Plan:   70 year old female with a moderate size hiatal hernia.  I reviewed her CT scan from 2020, and she has a good portion of stomach sitting behind her heart and her posterior mediastinum.  She also has some element of COPD, but based on her symptoms I question whether or not her respiratory symptoms is due to her hiatal hernia, and potential silent aspiration.  We discussed the risks and benefits of an EGD followed by a robotic assisted hiatal hernia repair with fundoplication.  Given her degree of reflux I do think that she should at least get a partial wrap.  She is interested in surgery but would like some time to think about it.  If she agrees to undergo this operation then she will need a formal CT chest.  She is currently on Plavix for a subclavian stent, and she is followed by Dr. Irish Lack who she is scheduled to see next week for her regular cardiology appointment.

## 2021-01-18 NOTE — Op Note (Signed)
[address]       [place], [address]             [phone]        01/18/2021  Patient:  [name] Pre-Op Dx: Hiatal hernia   Gastroesophageal reflux   COPD   Peripheral vascular disease    Post-op Dx: Same Procedure: - Esophagoscopy - Robotic assisted laparoscopy - Paraesophageal hernia repair with ACell mesh -Dor fundoplication   Surgeon and Role:      * Kapri Nero, Lucile Crater, MD - Primary    *D. Tacy Dura, PA-C- assisting  Anesthesia  general EBL: Minimal Blood Administration: None Specimen: None   Counts: correct   Indications: 70 year old female with a moderate size hiatal hernia. I reviewed her CT scan from 2020, and she has a good portion of stomach sitting behind her heart and her posterior mediastinum. She also has some element of COPD, but based on her symptoms I question whether or not her respiratory symptoms is due to her hiatal hernia, and potential silent aspiration. We discussed the risks and benefits of an EGD followed by a robotic assisted hiatal hernia repair with fundoplication. Given her degree of reflux I do think that she should at least get a partial wrap. She is interested in surgery but would like some time to think about it. If she agrees to undergo this operation then she will need a formal CT chest. She is currently on Plavix for a subclavian stent, and she is followed by Dr. Irish Lack.  Findings: Patulous esophagus on EGD.  Moderate size hiatal hernia with stomach and omentum in the hernia.  Small rent in the left pleural space was encountered.  4 stitches were placed posteriorly and one stitch was placed anteriorly for crural repair.  The stitches were buttressed with ACell mesh, and the U-shaped patch was used to reinforce the repair.  Operative Technique: After the risks, benefits and alternatives were thoroughly discussed, the patient was brought to the operative theatre.  Anesthesia was induced, and  the esophagoscope was passed through the oropharynx down to the stomach.  The scope was retroflexed and the hiatal hernia was clearly evident.  The scope was pulled back and the mucosal surface of the esophagus was visualized.    The esophagus was quite patulous..  The scope was then parked at 25 cm from the incisors.  The patient was then prepped and draped in normal sterile fashion.  An appropriate surgical pause was performed, and pre-operative antibiotics were dosed accordingly.  We began with a 1 cm incision 15 cm caudad from the xiphoid and slightly lateral to the umbilicus.  Using an Optiview we entered the peritoneal space.  The abdomen was then insufflated with CO2.  3 other robotic ports were placed to triangulate the hiatus.  Another 12 mm port was placed in place at the level of the umbilicus laterally for an assistant port and another 5 mm trocar was placed in the right lower quadrant for liver retractor.  The patient was then placed in steep reverse Trendelenburg and the liver was elevated to expose the esophageal hiatus.  And then the robot was docked.  We began by dividing the gastrohepatic ligament to expose the right diaphragmatic crus and then dissected the hernia sac in a clockwise fashion to mobilize there the stomach and esophagus.  We then divided the short gastrics and moved towards the right crus and completed our dissection along the esophageal hiatus.  A  Penrose drain was then used to encircle the the esophagus and we continued our dissection up into the mediastinum.  Once we had achieved 3 to 4 cm of intra-abdominal esophagus we then proceeded to reapproximate the crura with 0 Ethibond sutures in an interrupted fashion.  ACell pledgets were used to buttress the repair.  The use a patch was then placed over the repair as well for extra reinforcement.  The gastroscope was passed down through the lower esophageal sphincter into the stomach and would act as our bougie during this repair.   Next the stomach was passed anterior to the esophagus and Dor fundoplication was performed.  An air leak test was performed using the gastroscope.  No leak was evident.  The liver retractor was removed and all ports were removed under direct visualization.  The skin and soft tissue were closed with absorbable suture    The patient tolerated the procedure without any immediate complications, and was transferred to the PACU in stable condition.  [name]

## 2021-01-19 ENCOUNTER — Inpatient Hospital Stay (HOSPITAL_COMMUNITY): Payer: PPO

## 2021-01-19 ENCOUNTER — Encounter (HOSPITAL_COMMUNITY): Payer: Self-pay | Admitting: Thoracic Surgery (Cardiothoracic Vascular Surgery)

## 2021-01-19 LAB — BASIC METABOLIC PANEL
Anion gap: 7 (ref 5–15)
BUN: 10 mg/dL (ref 8–23)
CO2: 25 mmol/L (ref 22–32)
Calcium: 9.1 mg/dL (ref 8.9–10.3)
Chloride: 106 mmol/L (ref 98–111)
Creatinine, Ser: 0.79 mg/dL (ref 0.44–1.00)
GFR, Estimated: >60 mL/min (ref >60)
Glucose, Bld: 118 mg/dL — ABNORMAL HIGH (ref 70–99)
Potassium: 4.6 mmol/L (ref 3.5–5.1)
Sodium: 138 mmol/L (ref 135–145)

## 2021-01-19 LAB — CBC
HCT: 40.2 % (ref 36.0–46.0)
Hemoglobin: 13.4 g/dL (ref 12.0–15.0)
MCH: 31 pg (ref 26.0–34.0)
MCHC: 33.3 g/dL (ref 30.0–36.0)
MCV: 93.1 fL (ref 80.0–100.0)
Platelets: 215 10*3/uL (ref 150–400)
RBC: 4.32 MIL/uL (ref 3.87–5.11)
RDW: 12.4 % (ref 11.5–15.5)
WBC: 10.9 10*3/uL — ABNORMAL HIGH (ref 4.0–10.5)
nRBC: 0 % (ref 0.0–0.2)

## 2021-01-19 LAB — GLUCOSE, CAPILLARY: Glucose-Capillary: 99 mg/dL (ref 70–99)

## 2021-01-19 MED ORDER — ONDANSETRON HCL 4 MG/2ML IJ SOLN: 4.0000 mg | Freq: Four times a day (QID) | INTRAMUSCULAR | 0 refills | Status: DC | PRN

## 2021-01-19 MED ORDER — HYDROCODONE-ACETAMINOPHEN 7.5-325 MG/15ML PO SOLN: 10.0000 mL | Freq: Four times a day (QID) | ORAL | 0 refills | Status: DC | PRN

## 2021-01-19 MED ORDER — TRAMADOL HCL 50 MG PO TABS: 50.0000 mg | ORAL_TABLET | Freq: Four times a day (QID) | ORAL | 0 refills | Status: DC | PRN

## 2021-01-19 MED ORDER — IOHEXOL 300 MG/ML  SOLN
150.0000 mL | Freq: Once | INTRAMUSCULAR | Status: AC | PRN
  Administered 2021-01-19: 20 mL via ORAL

## 2021-01-19 MED ORDER — ONDANSETRON 4 MG PO TBDP: 4.0000 mg | ORAL_TABLET | Freq: Three times a day (TID) | ORAL | 0 refills | Status: DC | PRN

## 2021-01-19 NOTE — Anesthesia Postprocedure Evaluation (Signed)
Anesthesia Post Note  Patient: [name]  Procedure(s) Performed: XI ROBOTIC ASSISTED PARAESOPHAGEAL HERNIA REPAIR WITH FUNDOPLICATION WITH MESH (N/A Chest) ESOPHAGOGASTRODUODENOSCOPY (EGD) (N/A Abdomen)     Patient location during evaluation: PACU Anesthesia Type: General Level of consciousness: awake and alert Pain management: pain level controlled Vital Signs Assessment: post-procedure vital signs reviewed and stable Respiratory status: spontaneous breathing, nonlabored ventilation, respiratory function stable and patient connected to nasal cannula oxygen Cardiovascular status: blood pressure returned to baseline and stable Postop Assessment: no apparent nausea or vomiting Anesthetic complications: no   No complications documented.  Last Vitals:  Vitals:   01/19/21 0400 01/19/21 0740  BP: (!) 94/51 (!) 98/49  Pulse: 81 73  Resp: 18 17  Temp: (!) 36.4 C 36.4 C  SpO2: 95% 96%    Last Pain:  Vitals:   01/19/21 0740  TempSrc: Oral  PainSc:                  Audry Pili

## 2021-01-19 NOTE — Discharge Instructions (Signed)
Patient may eat foods that are the consistency of applesauce. Please do NOT drink any carbonated beverages until instructed  Discharge Instructions:  1. You may shower, please wash incisions daily with soap and water and keep dry.  If you wish to cover wounds with dressing you may do so but please keep clean and change daily.  No tub baths or swimming until incisions have completely healed.  If your incisions become red or develop any drainage please call our office at [phone]  2. No Driving until cleared by Dr. Abran Duke office and you are no longer using narcotic pain medications  3. Fever of 101.5 for at least 24 hours with no source, please contact our office at [phone]  4. Activity- up as tolerated, please walk at least 3 times per day.  Avoid strenuous activity, no lifting, pushing, or pulling with your arms over 8-10 lbs for one week  5. If any questions or concerns arise, please do not hesitate to contact our office at [phone]

## 2021-01-19 NOTE — Progress Notes (Signed)
Pt transported to radiology.

## 2021-01-19 NOTE — Plan of Care (Signed)
  Problem: Education: Goal: Knowledge of General Education information will improve Description: Including pain rating scale, medication(s)/side effects and non-pharmacologic comfort measures 01/19/2021 1344 by Leonie Man, RN Outcome: Adequate for Discharge 01/19/2021 0908 by Leonie Man, RN Outcome: Progressing   Problem: Health Behavior/Discharge Planning: Goal: Ability to manage health-related needs will improve 01/19/2021 1344 by Leonie Man, RN Outcome: Adequate for Discharge 01/19/2021 0908 by Leonie Man, RN Outcome: Progressing   Problem: Clinical Measurements: Goal: Ability to maintain clinical measurements within normal limits will improve 01/19/2021 1344 by Leonie Man, RN Outcome: Adequate for Discharge 01/19/2021 0908 by Leonie Man, RN Outcome: Progressing Goal: Will remain free from infection 01/19/2021 1344 by Leonie Man, RN Outcome: Adequate for Discharge 01/19/2021 0908 by Leonie Man, RN Outcome: Progressing Goal: Diagnostic test results will improve 01/19/2021 1344 by Leonie Man, RN Outcome: Adequate for Discharge 01/19/2021 0908 by Leonie Man, RN Outcome: Progressing Goal: Respiratory complications will improve 01/19/2021 1344 by Leonie Man, RN Outcome: Adequate for Discharge 01/19/2021 0908 by Leonie Man, RN Outcome: Progressing Goal: Cardiovascular complication will be avoided 01/19/2021 1344 by Leonie Man, RN Outcome: Adequate for Discharge 01/19/2021 0908 by Leonie Man, RN Outcome: Progressing   Problem: Activity: Goal: Risk for activity intolerance will decrease 01/19/2021 1344 by Leonie Man, RN Outcome: Adequate for Discharge 01/19/2021 0908 by Leonie Man, RN Outcome: Not Progressing   Problem: Nutrition: Goal: Adequate nutrition will be maintained 01/19/2021 1344 by Leonie Man, RN Outcome: Adequate for Discharge 01/19/2021 0908 by Leonie Man, RN Outcome: Not Progressing   Problem: Coping: Goal: Level of  anxiety will decrease 01/19/2021 1344 by Leonie Man, RN Outcome: Adequate for Discharge 01/19/2021 0908 by Leonie Man, RN Outcome: Progressing   Problem: Elimination: Goal: Will not experience complications related to bowel motility 01/19/2021 1344 by Leonie Man, RN Outcome: Adequate for Discharge 01/19/2021 0908 by Leonie Man, RN Outcome: Not Progressing Goal: Will not experience complications related to urinary retention 01/19/2021 1344 by Leonie Man, RN Outcome: Adequate for Discharge 01/19/2021 0908 by Leonie Man, RN Outcome: Progressing   Problem: Pain Managment: Goal: General experience of comfort will improve 01/19/2021 1344 by Leonie Man, RN Outcome: Adequate for Discharge 01/19/2021 0908 by Leonie Man, RN Outcome: Progressing   Problem: Safety: Goal: Ability to remain free from injury will improve 01/19/2021 1344 by Leonie Man, RN Outcome: Adequate for Discharge 01/19/2021 0908 by Leonie Man, RN Outcome: Progressing   Problem: Skin Integrity: Goal: Risk for impaired skin integrity will decrease 01/19/2021 1344 by Leonie Man, RN Outcome: Adequate for Discharge 01/19/2021 0908 by Leonie Man, RN Outcome: Not Progressing   Problem: Education: Goal: Required Educational Video(s) 01/19/2021 1344 by Leonie Man, RN Outcome: Adequate for Discharge 01/19/2021 0908 by Leonie Man, RN Outcome: Progressing   Problem: Clinical Measurements: Goal: Postoperative complications will be avoided or minimized 01/19/2021 1344 by Leonie Man, RN Outcome: Adequate for Discharge 01/19/2021 0908 by Leonie Man, RN Outcome: Progressing   Problem: Skin Integrity: Goal: Demonstration of wound healing without infection will improve 01/19/2021 1344 by Leonie Man, RN Outcome: Adequate for Discharge 01/19/2021 0908 by Leonie Man, RN Outcome: Progressing

## 2021-01-19 NOTE — Progress Notes (Signed)
      [address]       [place] [address]             [phone]       1 Day Post-Op Procedure(s) (LRB): XI ROBOTIC ASSISTED PARAESOPHAGEAL HERNIA REPAIR WITH FUNDOPLICATION WITH MESH (N/A) ESOPHAGOGASTRODUODENOSCOPY (EGD) (N/A)  Subjective: Patient has not had much pain. She states "my left eye is already less swollen". She denies nausea or vomiting.  Objective: Vital signs in last 24 hours: Temp:  [97 F (36.1 C)-97.6 F (36.4 C)] 97.5 F (36.4 C) (04/26 0400) Pulse Rate:  [71-94] 81 (04/26 0400) Cardiac Rhythm: Normal sinus rhythm (04/26 0328) Resp:  [12-19] 18 (04/26 0400) BP: (94-158)/(51-91) 94/51 (04/26 0400) SpO2:  [90 %-96 %] 95 % (04/26 0400) Arterial Line BP: (119-167)/(60-87) 122/60 (04/25 1600)      Intake/Output from previous day: 04/25 0701 - 04/26 0700 In: 1550 [I.V.:1300; IV Piggyback:250] Out: 2045 [Urine:2045]   Physical Exam:  Cardiovascular: RRR Pulmonary: Clear to auscultation bilaterally. Subcutaneous emphysema bilateral neck and upper chest, face, and left eye. Abdomen: Soft, non tender, bowel sounds present. Extremities: No lower extremity edema. Wounds: Clean and dry.  No erythema or signs of infection.   Lab Results: NTI:RWERXV Labs    01/18/21 1549 01/19/21 0053  WBC 9.1 10.9*  HGB 14.3 13.4  HCT 42.6 40.2  PLT 205 215   BMET:  Recent Labs    01/18/21 1549 01/19/21 0053  NA 137 138  K 3.9 4.6  CL 106 106  CO2 23 25  GLUCOSE 143* 118*  BUN 9 10  CREATININE 0.70 0.79  CALCIUM 9.0 9.1    PT/INR: No results for input(s): LABPROT, INR in the last 72 hours. ABG:  INR: Will add last result for INR, ABG once components are confirmed Will add last 4 CBG results once components are confirmed  Assessment/Plan:  1. CV - SR. 2.  Pulmonary - On 2 liters of oxygen via [place]. CXR post op yesterday showed small bilateral pneumothoraces with pneumomediastinum and extensive soft tissue gas in the chest walls and  cervical regions bilaterally.Subcutaneous emphysema should resolve in time. 3. CBGs 147/129/99. No history of diabetes. Will stop accu checks and SS PRN 4. GI-await swallow study this am 5. If swallow study shows no leak, possible discharge later today  Francisca Harbuck M ZimmermanPA-C 01/19/2021,7:05 AM [phone]

## 2021-01-19 NOTE — Progress Notes (Signed)
Daughter retrieved car from [place]. This nurse transported patient via wheelchair to awaiting vehicle in stable condition.

## 2021-01-19 NOTE — Plan of Care (Signed)
  Problem: Education: Goal: Knowledge of General Education information will improve Description: Including pain rating scale, medication(s)/side effects and non-pharmacologic comfort measures Outcome: Progressing   Problem: Health Behavior/Discharge Planning: Goal: Ability to manage health-related needs will improve Outcome: Progressing   Problem: Clinical Measurements: Goal: Ability to maintain clinical measurements within normal limits will improve Outcome: Progressing Goal: Will remain free from infection Outcome: Progressing Goal: Diagnostic test results will improve Outcome: Progressing Goal: Respiratory complications will improve Outcome: Progressing Goal: Cardiovascular complication will be avoided Outcome: Progressing   Problem: Coping: Goal: Level of anxiety will decrease Outcome: Progressing   Problem: Elimination: Goal: Will not experience complications related to urinary retention Outcome: Progressing   Problem: Pain Managment: Goal: General experience of comfort will improve Outcome: Progressing   Problem: Safety: Goal: Ability to remain free from injury will improve Outcome: Progressing   Problem: Education: Goal: Required Educational Video(s) Outcome: Progressing   Problem: Clinical Measurements: Goal: Postoperative complications will be avoided or minimized Outcome: Progressing   Problem: Skin Integrity: Goal: Demonstration of wound healing without infection will improve Outcome: Progressing   Problem: Activity: Goal: Risk for activity intolerance will decrease Outcome: Not Progressing   Problem: Nutrition: Goal: Adequate nutrition will be maintained Outcome: Not Progressing   Problem: Elimination: Goal: Will not experience complications related to bowel motility Outcome: Not Progressing   Problem: Skin Integrity: Goal: Risk for impaired skin integrity will decrease Outcome: Not Progressing

## 2021-01-21 ENCOUNTER — Other Ambulatory Visit: Payer: PPO

## 2021-01-27 ENCOUNTER — Other Ambulatory Visit: Payer: Self-pay

## 2021-01-28 ENCOUNTER — Encounter: Payer: Self-pay | Admitting: Family Medicine

## 2021-01-28 ENCOUNTER — Ambulatory Visit (INDEPENDENT_AMBULATORY_CARE_PROVIDER_SITE_OTHER): Payer: PPO | Admitting: Family Medicine

## 2021-01-28 ENCOUNTER — Other Ambulatory Visit: Payer: Self-pay | Admitting: Family Medicine

## 2021-01-28 ENCOUNTER — Other Ambulatory Visit: Payer: Self-pay

## 2021-01-28 VITALS — BP 124/70 | HR 78 | Temp 97.5°F | Resp 16 | Ht 64.5 in | Wt 132.4 lb

## 2021-01-28 DIAGNOSIS — R7303 Prediabetes: Secondary | ICD-10-CM

## 2021-01-28 DIAGNOSIS — E7800 Pure hypercholesterolemia, unspecified: Secondary | ICD-10-CM | POA: Diagnosis not present

## 2021-01-28 DIAGNOSIS — Z79899 Other long term (current) drug therapy: Secondary | ICD-10-CM | POA: Diagnosis not present

## 2021-01-28 DIAGNOSIS — E782 Mixed hyperlipidemia: Secondary | ICD-10-CM

## 2021-01-28 DIAGNOSIS — F411 Generalized anxiety disorder: Secondary | ICD-10-CM

## 2021-01-28 DIAGNOSIS — I10 Essential (primary) hypertension: Secondary | ICD-10-CM

## 2021-01-28 LAB — LIPID PANEL
Cholesterol: 205 mg/dL — ABNORMAL HIGH (ref 0–200)
HDL: 38.5 mg/dL — ABNORMAL LOW (ref >39.00)
NonHDL: 166.04
Total CHOL/HDL Ratio: 5
Triglycerides: 213 mg/dL — ABNORMAL HIGH (ref 0.0–149.0)
VLDL: 42.6 mg/dL — ABNORMAL HIGH (ref 0.0–40.0)

## 2021-01-28 LAB — LDL CHOLESTEROL, DIRECT: Direct LDL: 134 mg/dL

## 2021-01-28 MED ORDER — DIAZEPAM 2 MG PO TABS: 2.0000 mg | ORAL_TABLET | Freq: Two times a day (BID) | ORAL | 5 refills | Status: DC

## 2021-01-28 MED ORDER — EZETIMIBE 10 MG PO TABS: 10.0000 mg | ORAL_TABLET | Freq: Every day | ORAL | 2 refills | Status: DC

## 2021-01-28 NOTE — Progress Notes (Signed)
OFFICE VISIT  01/28/2021  CC:  Chief Complaint  Patient presents with  . Follow-up    RCI; GAD, HTN. She is fasting    HPI:    Patient is a 70 y.o. female who presents for 6 mo f/u HTN, HLD, prediabetes, GAD with high risk med use. A/P as of last visit: "HTN: stable. Cont lisinopril. Lytes/cr today.  Prediab: diet fair-to-good. Exercising daily on treadmill. Hba1c and fasting glucose today.  GAD, high risk med use: stable. CSC UTD. UDS today. Rx for diazepam 2mg , 1 bid prn, #60, RF x 5 today.  Health maintenance exam: Reviewed age and gender appropriate health maintenance issues (prudent diet, regular exercise, health risks of tobacco and excessive alcohol, use of seatbelts, fire alarms in home, use of sunscreen).  Also reviewed age and gender appropriate health screening as well as vaccine recommendations. Vaccines: ALL UTD.  Flu->UTD. Labs: fasting HP + A1c. Cervical ca screening: pt is s/p hysterectomy for benign dx.pt declines any further screening paps. Breast ca screening: screening mammogram due (last was 08/2019)->already scheduled. Colon ca screening: next colonoscopy due 2025. Osteoporosis screening: pt with osteopenia, due for repeat DEXA at this time->ordered to be done with mammogram"  INTERIM HX: No acute complaints today. She just recently got paraesophageal hernia repair on 01/18/21. Gradually improving energy level since surgery, no GI sx's, is still on soft diet, crushing pills or sprinkling. She is walking on treadmill.  HTN: consistently normal at home and when in hosp recently.  HLD: LDL up to 83 last check and she chose to cont high dose atorva and try to improve diet/exercise rather than add on zetia.  Has not taken atorva in 2 wks.  Prediabetes: Recent a1c 01/14/21 was 5.9% compared to 6.0% 6 mo prior.  Chronic anxiety level fine/stable, taking diazepam 2mg  2 tabs qhs. PMP AWARE reviewed today: most recent rx for diazepam 2mg  was filled  01/15/21, # 18, rx by me. No red flags.  ROS as above, plus--> no fevers, no CP, no SOB, no wheezing, no cough, no dizziness, no HAs, no rashes, no melena/hematochezia.  No polyuria or polydipsia.  No myalgias or arthralgias.  No focal weakness, paresthesias, or tremors.  No acute vision or hearing abnormalities.  No dysuria or unusual/new urinary urgency or frequency.  No recent changes in lower legs. No n/v/d or abd pain.  No palpitations.    Past Medical History:  Diagnosis Date  . Abdominal bruit 04/2017   Aortic u/s showed NO ANEURISM.  Marland Kitchen Allergy    SEASONAL  . Anemia    low iron  . Anxiety   . BCC (basal cell carcinoma), arm, right   . Carotid artery occlusion   . Cataract   . Colon cancer screening 05/2017   Colonoscopy reportedly normal approx 2008.  Cologuard NEG--repeat 3 yrs.  Marland Kitchen COPD (chronic obstructive pulmonary disease) ([place])    DOE, worse since 11/2018.  Stress test neg 01/2019.  Albut x 1 inh prn helpful some.  Pulm 06/2019, PFTs not diag of COPD, DOE likely from deconditioning.  . Essential hypertension, benign 05/22/2014  . GERD (gastroesophageal reflux disease)   . H/O hiatal hernia 02/2019   EGD-->moderate size->surgery recommended but pt declined.  . Hematochezia 2020/21   internal hemorrhoids  . History of iron deficiency anemia 07/2019   Dr. Bryan Lemma EGD and colonoscopy, some anorectal muscosal ulceration, o/w just mild chronic gastritis.   Also could be poor absorption due to chronic high dose acid suppression. Hb/iron normalized aft  iron infus and ongoing oral iron.  Marland Kitchen History of kidney stones    one time  . Melanoma ([place])    pt denies   . Mitral valve prolapse   . Mixed hyperlipidemia 05/22/2014  . OAB (overactive bladder)    detrol LA from urol helpful  . Osteopenia 06/06/2016   05/2016 DEXA T-score -1.5.  06/2018 DEXA T score -1.1  . Peripheral vascular disease, unspecified ([place]) 05/22/2014   innominate and carotid.  U/S f/u 12/2016 showed no signif  change compared to 2017--vascular recommended repeat 1 yr.  Doing well as of 08/2018 cardiol f/u-->continued on ASA and Plavix. F/u 08/2019->ASA d/c'd, plavix continued.  Marland Kitchen PONV (postoperative nausea and vomiting)   . Pre-diabetes   . Prediabetes 2018   2018 A1c 6.3%.  05/2018 A1c 6.1%. 07/2019 A1c 6.4%.  Marland Kitchen Reflux esophagitis   . Stroke [facility]) 2006   Mini  Stroke- not major  . Tobacco abuse, in remission    quit 2009    Past Surgical History:  Procedure Laterality Date  . ABDOMINAL HYSTERECTOMY  1997  . CARDIAC CATHETERIZATION  01/2012   NORMAL  . CARDIOVASCULAR STRESS TEST  02/05/2019   NORMAL.  EF normal.  . CAROTID ENDARTERECTOMY  08/21/2007   right - Dr. Amedeo Plenty  . CHOLECYSTECTOMY  2000  . COLONOSCOPY  approx 2008; 09/23/19   2008 (Dr. Lenon Ahmadi pt->normal.  08/2019 adenoma, some friable areas of colon + anorectal mucosa inflam w/ ulceration, +diverticulosis. Recall 5 yrs  . DEXA  06/06/2016   05/2016 T-score -1.5.  06/2018 T score -1.1.  Repeat 2 yrs.  . ESOPHAGOGASTRODUODENOSCOPY  03/11/2019; 09/23/19   02/2019; 5 cm hiatal hernia; GI suggested surgical repair due to intractable GER.  08/2019 mild chronic gastritis, h pylori neg, multiple benign bx's including duodenum neg for celiac changes.  . ESOPHAGOGASTRODUODENOSCOPY N/A 01/18/2021   Procedure: ESOPHAGOGASTRODUODENOSCOPY (EGD);  Surgeon: [name], MD;  Location: [place];  Service: Thoracic;  Laterality: N/A;  . Incision and drainage of left thenar space abscess  02/23/15   s/p cat bite (Dr. Amedeo Plenty)  . innominate stent  2007   12/2014 u/s showed patent stent in innominate and L common carotid.  L prox int carotid 40-59% stenosis  . LDCT for lung ca screening  06/2019   NEG->rpt 1 yr  . Left common carotid stent  2006 and 2007   "                    "                  "                          "                             "  . TRANSTHORACIC ECHOCARDIOGRAM  2007   NORMAL  . US CAROTID DOPPLER BILATERAL ([place] [facility])   06/2019   left stent w/out stenosis.  R ICA sten 40-50% obst-stable. Rpt 1 yr.  Marland Kitchen XI ROBOTIC ASSISTED PARAESOPHAGEAL HERNIA REPAIR N/A 01/18/2021   Procedure: XI ROBOTIC ASSISTED PARAESOPHAGEAL HERNIA REPAIR WITH FUNDOPLICATION WITH MESH;  Surgeon: [name], MD;  Location: [place];  Service: Thoracic;  Laterality: N/A;    Outpatient Medications Prior to Visit  Medication Sig Dispense Refill  . atorvastatin (LIPITOR) 80 MG tablet TAKE ONE TABLET  BY MOUTH DAILY (Patient taking differently: Take 80 mg by mouth daily.) 30 tablet 5  . cholecalciferol (VITAMIN D) 1000 UNITS tablet Take 1,000 Units by mouth daily.    . clopidogrel (PLAVIX) 75 MG tablet TAKE ONE TABLET BY MOUTH DAILY (Patient taking differently: Take 75 mg by mouth daily.) 90 tablet 1  . estradiol (ESTRACE) 0.1 MG/GM vaginal cream Place 1 Applicatorful vaginally 2 (two) times a week.    . famotidine (PEPCID) 40 MG tablet TAKE ONE TABLET BY MOUTH DAILY WITH LUNCH 90 tablet 3  . ferrous sulfate 325 (65 FE) MG tablet Take 325 mg by mouth daily.     . fexofenadine (ALLEGRA) 180 MG tablet Take 180 mg by mouth daily. Take 1 tablet daily for allergies    . fluticasone (FLONASE) 50 MCG/ACT nasal spray Place 2 sprays into both nostrils daily. (Patient taking differently: Place 2 sprays into both nostrils at bedtime.) 16 g 6  . lisinopril (ZESTRIL) 5 MG tablet TAKE ONE TABLET BY MOUTH DAILY (Patient taking differently: Take 5 mg by mouth daily.) 90 tablet 1  . Magnesium 500 MG CAPS Take 500 mg by mouth daily.    . Omega-3 Fatty Acids (FISH OIL) 1000 MG CAPS Take 2,000 mg by mouth daily.     . pantoprazole (PROTONIX) 40 MG tablet TAKE ONE TABLET BY MOUTH TWICE DAILY (Patient taking differently: Take 40 mg by mouth daily.) 180 tablet 1  . Probiotic Product (PROBIOTIC DAILY PO) Take 1 capsule by mouth daily.    . Tiotropium Bromide Monohydrate (SPIRIVA RESPIMAT) 2.5 MCG/ACT AERS Inhale 2 puffs into the lungs daily. (Patient taking  differently: Inhale 2 puffs into the lungs daily as needed (SOB/ wheezing).) 4 g 11  . tolterodine (DETROL LA) 4 MG 24 hr capsule Take 4 mg by mouth daily.    . diazepam (VALIUM) 2 MG tablet Take 1 tablet (2 mg total) by mouth 2 (two) times daily. 60 tablet 5  . albuterol (VENTOLIN HFA) 108 (90 Base) MCG/ACT inhaler Inhale 2 puffs into the lungs every 6 (six) hours as needed for wheezing or shortness of breath. (Patient not taking: Reported on 01/28/2021) 8 g 6  . nitroGLYCERIN (NITROSTAT) 0.4 MG SL tablet Place 1 tablet (0.4 mg total) under the tongue every 5 (five) minutes as needed for chest pain. (Patient not taking: Reported on 01/28/2021) 25 tablet 6   No facility-administered medications prior to visit.    Allergies  Allergen Reactions  . Doxycycline Shortness Of Breath and Swelling    Lips swelling and short of breath  . Fentanyl Nausea And Vomiting    Pt states all pain medications cause N/V for her Pt states all pain medications cause N/V for her  . Fluocinolone Nausea And Vomiting  . Meperidine Nausea And Vomiting  . Penicillins Hives  . Breo Ellipta [Fluticasone Furoate-Vilanterol] Other (See Comments)    Lower extremity weakness, lightheadedness   . Imdur [Isosorbide Nitrate] Nausea And Vomiting    ROS As per HPI  PE: Vitals with BMI 01/28/2021 01/19/2021 01/19/2021  Height 5' 4.5" - -  Weight 132 lbs 6 oz - -  BMI 11.91 - -  Systolic 478 295 98  Diastolic 70 82 49  Pulse 78 75 73     Gen: Alert, well appearing.  Patient is oriented to person, place, time, and situation. AFFECT: pleasant, lucid thought and speech. CV: RRR, no m/r/g.   LUNGS: CTA bilat, nonlabored resps, good aeration in all lung fields. ABD: soft, NT,  ND, BS normal. Well healed small laparoscopic incisions w/out sign of infxn. No hepatospenomegaly or mass.  No bruits. EXT: no clubbing or cyanosis.  no edema.    LABS:  Lab Results  Component Value Date   TSH 0.84 07/31/2020   Lab Results   Component Value Date   WBC 10.9 (H) 01/19/2021   HGB 13.4 01/19/2021   HCT 40.2 01/19/2021   MCV 93.1 01/19/2021   PLT 215 01/19/2021   Lab Results  Component Value Date   CREATININE 0.79 01/19/2021   BUN 10 01/19/2021   NA 138 01/19/2021   K 4.6 01/19/2021   CL 106 01/19/2021   CO2 25 01/19/2021   Lab Results  Component Value Date   ALT 29 01/14/2021   AST 30 01/14/2021   ALKPHOS 86 01/14/2021   BILITOT 1.9 (H) 01/14/2021   Lab Results  Component Value Date   CHOL 173 07/31/2020   Lab Results  Component Value Date   HDL 55.60 07/31/2020   Lab Results  Component Value Date   LDLCALC 83 07/31/2020   Lab Results  Component Value Date   TRIG 172.0 (H) 07/31/2020   Lab Results  Component Value Date   CHOLHDL 3 07/31/2020   Lab Results  Component Value Date   HGBA1C 5.9 (H) 01/14/2021   IMPRESSION AND PLAN:  1) HTN, well controlled on lisinopril 5mg  qd. Lytes/cr normal in hosp 2 wks ago.  2) HLD: atorva 80mg  qd long term, LDL goal is <70. Last LDL 83 six mo ago and she chose to try to improve diet/activity before a trial of add-on zetia. If LDL not at goal this time she is willing to add on zetia.  3) Prediabetes: diet and activity level good. A1c stable at 5.9% 2 wks ago.  4) GAD, with anxiety-related insomnia:  Doing well long term on diazepam, 2 mg qhs. CSC and UDS UTD. Diazepam 2mg , 1 bid, #60, RF x 5 ->eRx'd today.  5) Paraesophageal hernia repair 10 d/a. Recovering well at this time.   Gradually advancing diet as per surgeon's rec's.  An After Visit Summary was printed and given to the patient.  FOLLOW UP: Return in about 6 months (around 07/31/2021) for annual CPE (fasting).  Signed:  Crissie Sickles, MD           01/28/2021

## 2021-01-29 ENCOUNTER — Telehealth (INDEPENDENT_AMBULATORY_CARE_PROVIDER_SITE_OTHER): Payer: Self-pay | Admitting: Thoracic Surgery (Cardiothoracic Vascular Surgery)

## 2021-01-29 DIAGNOSIS — K449 Diaphragmatic hernia without obstruction or gangrene: Secondary | ICD-10-CM

## 2021-01-29 NOTE — Progress Notes (Signed)
     [address]       [place] [address]             [phone]       Patient: Home Provider: Office Consent for Telemedicine visit obtained.  Today's visit was completed via a real-time telehealth (see specific modality noted below). The patient/authorized person provided oral consent at the time of the visit to engage in a telemedicine encounter with the present provider at [facility]. The patient/authorized person was informed of the potential benefits, limitations, and risks of telemedicine. The patient/authorized person expressed understanding that the laws that protect confidentiality also apply to telemedicine. The patient/authorized person acknowledged understanding that telemedicine does not provide emergency services and that he or she would need to call 911 or proceed to the nearest hospital for help if such a need arose.  . Total time spent in the clinical discussion 10 minutes. . Telehealth Modality: Phone visit (audio only)  I had a telephone visit with [name].  She is doing well.  Her respiratory status has improved.  She denies any reflux or dysphagia.  She has had 1 episode of nausea, and has had a hard time belching.  She only has pain when she yawns.  I will see her back in 1 month with a cxr.  Susie Pousson Bary Leriche

## 2021-02-01 ENCOUNTER — Encounter: Payer: Self-pay | Admitting: Family Medicine

## 2021-02-01 NOTE — Telephone Encounter (Signed)
A user error has taken place: encounter opened in error, closed for administrative reasons.

## 2021-02-01 NOTE — Telephone Encounter (Signed)
Please Advise

## 2021-02-02 NOTE — Telephone Encounter (Signed)
Please advise, thanks.

## 2021-02-03 NOTE — Telephone Encounter (Signed)
Not common but yes, unfortunately this can sometimes happen. Apply ice to the area 20 min twice a day. Can she send a picture through mychart?

## 2021-02-11 ENCOUNTER — Other Ambulatory Visit: Payer: Self-pay | Admitting: Family Medicine

## 2021-02-15 ENCOUNTER — Telehealth: Payer: Self-pay

## 2021-02-15 NOTE — Telephone Encounter (Signed)
Patient contacted the office concerned that she has started to burp after drinking water and was worried that this was not normal. Per Dr. Kipp Brood it is normal and due to swelling decrease.  Patient make aware and acknowledged receipt.

## 2021-02-25 ENCOUNTER — Other Ambulatory Visit: Payer: Self-pay | Admitting: Thoracic Surgery (Cardiothoracic Vascular Surgery)

## 2021-02-25 DIAGNOSIS — K449 Diaphragmatic hernia without obstruction or gangrene: Secondary | ICD-10-CM

## 2021-02-26 ENCOUNTER — Ambulatory Visit
Admission: RE | Admit: 2021-02-26 | Discharge: 2021-02-26 | Disposition: A | Discharge status: Home / Self Care | Payer: PPO | Source: Ambulatory Visit | Attending: Thoracic Surgery (Cardiothoracic Vascular Surgery) | Admitting: Thoracic Surgery (Cardiothoracic Vascular Surgery)

## 2021-02-26 ENCOUNTER — Ambulatory Visit (INDEPENDENT_AMBULATORY_CARE_PROVIDER_SITE_OTHER): Payer: Self-pay | Admitting: Thoracic Surgery (Cardiothoracic Vascular Surgery)

## 2021-02-26 ENCOUNTER — Other Ambulatory Visit: Payer: Self-pay

## 2021-02-26 VITALS — BP 113/72 | HR 96 | Resp 20 | Ht 64.5 in | Wt 132.0 lb

## 2021-02-26 DIAGNOSIS — K449 Diaphragmatic hernia without obstruction or gangrene: Secondary | ICD-10-CM

## 2021-02-26 DIAGNOSIS — Z09 Encounter for follow-up examination after completed treatment for conditions other than malignant neoplasm: Secondary | ICD-10-CM

## 2021-02-26 NOTE — Progress Notes (Signed)
      [address]       [place] [address]             [phone]        [name] [facility] Medical Record #[id number] Date of Birth: 18-Dec-1950  Referring: Garner Nash, DO Primary Care: Tammi Sou, MD Primary Cardiologist:Jayadeep Irish Lack, MD  Reason for visit:   follow-up  History of Present Illness:     [name] comes in for 1 month follow-up appointment.  Overall she is doing quite well.  She denies any dysphagia or diet aphasia.  She has been taking her reflux medication but out of habit but does not think that she needs to take it anymore.  Physical Exam: BP 113/72   Pulse 96   Resp 20   Ht 5' 4.5" (1.638 m)   Wt 132 lb (59.9 kg)   SpO2 97% Comment: RA  BMI 22.31 kg/m   Alert NAD Incision clean.   Abdomen soft, ND No peripheral edema   Diagnostic Studies & Laboratory data: CXR: Clear     Assessment / Plan:   70 year old female status post paraesophageal hernia repair, currently doing quite well.  She is back on a regular diet without difficulty  I will see her back in 3 months as a virtual visit.   [name] 02/26/2021 2:47 PM

## 2021-03-16 ENCOUNTER — Other Ambulatory Visit: Payer: Self-pay | Admitting: Family Medicine

## 2021-04-01 ENCOUNTER — Other Ambulatory Visit: Payer: Self-pay

## 2021-04-01 ENCOUNTER — Ambulatory Visit (INDEPENDENT_AMBULATORY_CARE_PROVIDER_SITE_OTHER): Payer: PPO

## 2021-04-01 DIAGNOSIS — E782 Mixed hyperlipidemia: Secondary | ICD-10-CM

## 2021-04-01 LAB — LIPID PANEL
Cholesterol: 127 mg/dL (ref 0–200)
HDL: 49.3 mg/dL (ref >39.00)
LDL Cholesterol: 46 mg/dL (ref 0–99)
NonHDL: 77.65
Total CHOL/HDL Ratio: 3
Triglycerides: 158 mg/dL — ABNORMAL HIGH (ref 0.0–149.0)
VLDL: 31.6 mg/dL (ref 0.0–40.0)

## 2021-04-01 LAB — ALT: ALT: 33 U/L (ref 0–35)

## 2021-04-01 LAB — AST: AST: 30 U/L (ref 0–37)

## 2021-04-14 ENCOUNTER — Other Ambulatory Visit: Payer: Self-pay | Admitting: Family Medicine

## 2021-05-10 ENCOUNTER — Other Ambulatory Visit: Payer: Self-pay | Admitting: *Deleted

## 2021-05-10 ENCOUNTER — Encounter: Payer: Self-pay | Admitting: Primary Care

## 2021-05-10 ENCOUNTER — Ambulatory Visit: Payer: PPO | Admitting: Primary Care

## 2021-05-10 ENCOUNTER — Other Ambulatory Visit: Payer: Self-pay

## 2021-05-10 DIAGNOSIS — R0600 Dyspnea, unspecified: Secondary | ICD-10-CM | POA: Diagnosis not present

## 2021-05-10 DIAGNOSIS — Z87891 Personal history of nicotine dependence: Secondary | ICD-10-CM | POA: Diagnosis not present

## 2021-05-10 DIAGNOSIS — J438 Other emphysema: Secondary | ICD-10-CM | POA: Diagnosis not present

## 2021-05-10 DIAGNOSIS — R0609 Other forms of dyspnea: Secondary | ICD-10-CM

## 2021-05-10 NOTE — Progress Notes (Signed)
$'@Patient's$  ID: [name], female    DOB: 12-Aug-1951, 70 y.o.   MRN: [id number]  No chief complaint on file.   Referring provider: Tammi Sou, MD  HPI: 70 year old female, former smoker quit in 2009 (47.5-pack-year history).  Past medical history significant for hypertension, coronary artery disease, peripheral vascular disease, hyperlipidemia, dyspnea on exertion, hiatal hernia, overweight.  Patient of Dr. Valeta Harms, last seen in office on 11/09/2020.  Last [facility] pulmonary encounters: 11/09/20- Dr. Valeta Harms, Consult  Chief Complaint  Patient presents with   Consult    Establish care.   Former pt of Dr. Tamala Julian.  She started last week with DOE after working in the yard.      This 70 year old female, former smoker, history of gastroesophageal reflux, moderate hiatal hernia, cough, stroke, emphysema on lung cancer screening CT however no obstruction on PFTs.  Patient presents today with ongoing shortness of breath.  Was seen by Dr. Tamala Julian in 2020.  Patient was not interested at the time of having hiatal hernia evaluated due to concern for an open repair.  She did meet with general surgery at one point in time.  She follows with Dr. Loletha Grayer from gastroenterology.  05/10/2021- Interim hx  Patient presents today for 6 month follow-up. She is maintained on Spiriva and Albuterol HFA prn. During her last visit with Dr. Valeta Harms she was referred to cardiothoracic surgery for evaluation of hiatal hernia. She has emphysema on previous CT but spirometry was reassuring.   She had repair of hiatal hernia with Dr. Kipp Brood in April 2022. She is off all her reflux medication. Her shortness of breath has also improved. She is able to do yard work. She walks 50 mins 7 days a week and she also does yoga twice a week. She is not using Spiriva respimat. Her CXR in June was normal, lungs were clear. She has never needed to use her rescue inhaler. She will need repeat CT chest in March 2023.     Allergies  Allergen  Reactions   Doxycycline Shortness Of Breath and Swelling    Lips swelling and short of breath   Fentanyl Nausea And Vomiting    Pt states all pain medications cause N/V for her Pt states all pain medications cause N/V for her   Fluocinolone Nausea And Vomiting   Meperidine Nausea And Vomiting   Penicillins Hives   Breo Ellipta [Fluticasone Furoate-Vilanterol] Other (See Comments)    Lower extremity weakness, lightheadedness    Imdur [Isosorbide Nitrate] Nausea And Vomiting    Immunization History  Administered Date(s) Administered   Fluad Quad(high Dose 65+) 06/11/2019   Influenza, High Dose Seasonal PF 05/23/2018, 07/21/2020   Moderna Sars-Covid-2 Vaccination 10/23/2019, 11/20/2019, 07/21/2020   Pneumococcal Conjugate-13 05/23/2016   Pneumococcal Polysaccharide-23 05/24/2017   Tdap 02/22/2015   Zoster Recombinat (Shingrix) 07/11/2018, 10/18/2018    Past Medical History:  Diagnosis Date   Abdominal bruit 04/2017   Aortic u/s showed NO ANEURISM.   Allergy    SEASONAL   Anemia    low iron   Anxiety    BCC (basal cell carcinoma), arm, right    Carotid artery occlusion    Cataract    Colon cancer screening 05/2017   Colonoscopy reportedly normal approx 2008.  Cologuard NEG--repeat 3 yrs.   COPD (chronic obstructive pulmonary disease) ([place])    DOE, worse since 11/2018.  Stress test neg 01/2019.  Albut x 1 inh prn helpful some.  Pulm 06/2019, PFTs not diag of COPD, DOE likely  from deconditioning.   Essential hypertension, benign 05/22/2014   GERD (gastroesophageal reflux disease)    H/O hiatal hernia 02/2019   EGD-->moderate size->surgery recommended but pt declined.   Hematochezia 2020/21   internal hemorrhoids   History of iron deficiency anemia 07/2019   Dr. Bryan Lemma EGD and colonoscopy, some anorectal muscosal ulceration, o/w just mild chronic gastritis.   Also could be poor absorption due to chronic high dose acid suppression. Hb/iron normalized aft iron infus and  ongoing oral iron.   History of kidney stones    one time   Melanoma [facility])    pt denies    Mitral valve prolapse    Mixed hyperlipidemia 05/22/2014   OAB (overactive bladder)    detrol LA from urol helpful   Osteopenia 06/06/2016   05/2016 DEXA T-score -1.5.  06/2018 DEXA T score -1.1   Peripheral vascular disease, unspecified ([place]) 05/22/2014   innominate and carotid.  U/S f/u 12/2016 showed no signif change compared to 2017--vascular recommended repeat 1 yr.  Doing well as of 08/2018 cardiol f/u-->continued on ASA and Plavix. F/u 08/2019->ASA d/c'd, plavix continued.   PONV (postoperative nausea and vomiting)    Pre-diabetes    Prediabetes 2018   2018 A1c 6.3%.  05/2018 A1c 6.1%. 07/2019 A1c 6.4%.   Reflux esophagitis    Stroke [facility]) 2006   Mini  Stroke- not major   Tobacco abuse, in remission    quit 2009    Tobacco History: Social History   Tobacco Use  Smoking Status Former   Packs/day: 1.25   Years: 38.00   Pack years: 47.50   Types: Cigarettes   Quit date: 09/27/2007   Years since quitting: 13.6  Smokeless Tobacco Never   Counseling given: Not Answered   Outpatient Medications Prior to Visit  Medication Sig Dispense Refill   albuterol (VENTOLIN HFA) 108 (90 Base) MCG/ACT inhaler Inhale 2 puffs into the lungs every 6 (six) hours as needed for wheezing or shortness of breath. 8 g 6   atorvastatin (LIPITOR) 80 MG tablet TAKE ONE TABLET BY MOUTH DAILY 90 tablet 1   cholecalciferol (VITAMIN D) 1000 UNITS tablet Take 1,000 Units by mouth daily.     clopidogrel (PLAVIX) 75 MG tablet TAKE ONE TABLET BY MOUTH DAILY 90 tablet 1   diazepam (VALIUM) 2 MG tablet Take 1 tablet (2 mg total) by mouth 2 (two) times daily. 60 tablet 5   estradiol (ESTRACE) 0.1 MG/GM vaginal cream Place 1 Applicatorful vaginally 2 (two) times a week.     ezetimibe (ZETIA) 10 MG tablet TAKE ONE TABLET ('10mg'$ ) BY MOUTH ONCE DAILY 30 tablet 0   ferrous sulfate 325 (65 FE) MG tablet Take 325 mg by mouth  daily.      fexofenadine (ALLEGRA) 180 MG tablet Take 180 mg by mouth daily. Take 1 tablet daily for allergies     fluticasone (FLONASE) 50 MCG/ACT nasal spray Place 2 sprays into both nostrils daily. 16 g 4   lisinopril (ZESTRIL) 5 MG tablet TAKE ONE TABLET BY MOUTH DAILY 90 tablet 1   Magnesium 500 MG CAPS Take 500 mg by mouth daily.     nitroGLYCERIN (NITROSTAT) 0.4 MG SL tablet Place 1 tablet (0.4 mg total) under the tongue every 5 (five) minutes as needed for chest pain. 25 tablet 6   Omega-3 Fatty Acids (FISH OIL) 1000 MG CAPS Take 2,000 mg by mouth daily.      Probiotic Product (PROBIOTIC DAILY PO) Take 1 capsule by mouth daily.  tolterodine (DETROL LA) 4 MG 24 hr capsule Take 4 mg by mouth daily.     Tiotropium Bromide Monohydrate (SPIRIVA RESPIMAT) 2.5 MCG/ACT AERS Inhale 2 puffs into the lungs daily. (Patient taking differently: Inhale 2 puffs into the lungs daily as needed (SOB/ wheezing).) 4 g 11   No facility-administered medications prior to visit.      Review of Systems  Review of Systems  Constitutional: Negative.   HENT: Negative.    Respiratory: Negative.    Cardiovascular: Negative.     Physical Exam  BP 124/76   Pulse 89   Temp 97.8 F (36.6 C) (Oral)   Ht '5\' 4"'$  (1.626 m)   Wt 134 lb 12.8 oz (61.1 kg)   SpO2 99%   BMI 23.14 kg/m  Physical Exam Constitutional:      Appearance: Normal appearance.  HENT:     Head: Normocephalic and atraumatic.     Mouth/Throat:     Mouth: Mucous membranes are moist.     Pharynx: Oropharynx is clear.  Cardiovascular:     Rate and Rhythm: Normal rate and regular rhythm.  Pulmonary:     Effort: Pulmonary effort is normal.     Breath sounds: Normal breath sounds.  Skin:    General: Skin is warm and dry.  Neurological:     General: No focal deficit present.     Mental Status: She is alert and oriented to person, place, and time. Mental status is at baseline.  Psychiatric:        Mood and Affect: Mood normal.         Behavior: Behavior normal.        Thought Content: Thought content normal.        Judgment: Judgment normal.     Lab Results:  CBC    Component Value Date/Time   WBC 10.9 (H) 01/19/2021 0053   RBC 4.32 01/19/2021 0053   HGB 13.4 01/19/2021 0053   HCT 40.2 01/19/2021 0053   PLT 215 01/19/2021 0053   MCV 93.1 01/19/2021 0053   MCH 31.0 01/19/2021 0053   MCHC 33.3 01/19/2021 0053   RDW 12.4 01/19/2021 0053   LYMPHSABS 2.8 07/31/2020 0929   MONOABS 0.5 07/31/2020 0929   EOSABS 0.2 07/31/2020 0929   BASOSABS 0.1 07/31/2020 0929    BMET    Component Value Date/Time   NA 138 01/19/2021 0053   NA 140 12/07/2020 1113   K 4.6 01/19/2021 0053   CL 106 01/19/2021 0053   CO2 25 01/19/2021 0053   GLUCOSE 118 (H) 01/19/2021 0053   BUN 10 01/19/2021 0053   BUN 12 12/07/2020 1113   CREATININE 0.79 01/19/2021 0053   CALCIUM 9.1 01/19/2021 0053   GFRNONAA >60 01/19/2021 0053   GFRAA >60 05/15/2020 1256    BNP No results found for: BNP  ProBNP No results found for: PROBNP  Imaging: No results found.   Assessment & Plan:   Other emphysema ([place]) - Patient has moderate emphysema on CT imaging - No clinically symptoms, continue to monitor - Discontinue Spiriva respimat. Continue PRN Albuterol 2 puff every 6 hours as needed - FU in April 2023 after releat LDCT with lung cancer screening clinic   DOE (dyspnea on exertion) - Resolved after hiatal hernia repair   Former smoker - Quit in 2009, 47 pack year hx. Her last LDCT was in 2020 and showed Lung RADS1. She had CTA imaging with cardiothoracic surgery in [place] 2022 that showed no evidence of any  pulmonary nodules or masses. She will need annual follow-up with our lung cancer screening program. She will be due for LDCT in March 2023    Martyn Ehrich, NP 05/10/2021

## 2021-05-10 NOTE — Assessment & Plan Note (Addendum)
-   Quit in 2009, 47 pack year hx. Her last LDCT was in 2020 and showed Lung RADS1. She had CTA imaging with cardiothoracic surgery in [place] 2022 that showed no evidence of any pulmonary nodules or masses. She will need annual follow-up with our lung cancer screening program. She will be due for LDCT in March 2023

## 2021-05-10 NOTE — Patient Instructions (Addendum)
Very nice to meet you today Mr. [name] you are doing so well  Recommendations - Stop Spiriva - Continue to use albuterol rescue inhaler 2 puffs every 6 hours as needed for breakthrough shortness of breath or wheezing -Will be due for repeat CT chest in March 2023 to follow-up with our lung cancer screening clinic  Follow-up -April 2023 with either Dr. Valeta Harms or Eustaquio Maize NP

## 2021-05-10 NOTE — Assessment & Plan Note (Signed)
-   Resolved after hiatal hernia repair

## 2021-05-10 NOTE — Assessment & Plan Note (Addendum)
-   Patient has moderate emphysema on CT imaging - No clinically symptoms, continue to monitor - Discontinue Spiriva respimat. Continue PRN Albuterol 2 puff every 6 hours as needed - FU in April 2023 after releat LDCT with lung cancer screening clinic

## 2021-05-11 ENCOUNTER — Other Ambulatory Visit: Payer: Self-pay | Admitting: Family Medicine

## 2021-05-28 ENCOUNTER — Other Ambulatory Visit: Payer: Self-pay

## 2021-05-28 ENCOUNTER — Telehealth: Payer: PPO | Admitting: Thoracic Surgery (Cardiothoracic Vascular Surgery)

## 2021-05-28 DIAGNOSIS — Z09 Encounter for follow-up examination after completed treatment for conditions other than malignant neoplasm: Secondary | ICD-10-CM

## 2021-05-28 NOTE — Progress Notes (Signed)
     [address]       [place] [address]             [phone]       Patient: Home Provider: Office Consent for Telemedicine visit obtained.  Today's visit was completed via a real-time telehealth (see specific modality noted below). The patient/authorized person provided oral consent at the time of the visit to engage in a telemedicine encounter with the present provider at [facility]. The patient/authorized person was informed of the potential benefits, limitations, and risks of telemedicine. The patient/authorized person expressed understanding that the laws that protect confidentiality also apply to telemedicine. The patient/authorized person acknowledged understanding that telemedicine does not provide emergency services and that he or she would need to call 911 or proceed to the nearest hospital for help if such a need arose.   Total time spent in the clinical discussion 10 minutes.  Telehealth Modality: Phone visit (audio only)  I had a telephone visit with [name].  She underwent a PEH replacement in April of 2022.  She states that she is doing very well.  Since surgery she is only had to use her reflux medication once but this was after spaghetti with a lot of spicy flakes on it.  She denies any other episodes of reflux, and has not had any dysphagia.  Follow-up as needed

## 2021-06-08 NOTE — Progress Notes (Signed)
Cardiology Office Note   Date:  06/09/2021   ID:  JAEDYNN [name], DOB May 15, 1951, MRN [id number]  PCP:  Tammi Sou, MD    No chief complaint on file.  PAD  Wt Readings from Last 3 Encounters:  06/09/21 132 lb 12.8 oz (60.2 kg)  05/10/21 134 lb 12.8 oz (61.1 kg)  02/26/21 132 lb (59.9 kg)       History of Present Illness: [name] is a 70 y.o. female   with  a family history of significant coronary artery disease. She has a history of early vascular disease and difficult to control hyperlipidemia.  She has had significant carotid disease in the past with stents placed in the right innominate artery 2007) and left common carotid artery (2006). Right CEA in 2008.   She had a cardiac cath in 2013 showing no significant coronary artery disease and only minimal coronary calcification.     Her husband passed away in 13-Oct-2015. He was at work in [place] when he died from a viral illness.  It was very quick.    Occasional BP spikes at the MDs office.    She works across the street from the [employer]  She worked during the womens and mens [place] in 2020.   She had a viral infection in 11/2018.  She had a chest xray.  She was treated with antibiotics.  Since then, she has had more DOE.  SHe has not been able to get back to her usual self.  She gets some DOE with mowing the lawn.     Stress test in 01/2019: Nuclear stress EF: 56%. The left ventricular ejection fraction is normal (55-65%). No T wave inversion was noted during stress. There was no ST segment deviation noted during stress. The study is normal. This is a low risk study.   Normal perfusion. LVEF 56% with normal wall motion. This is a low risk study.   In 2020, she retired. Her iron was low.  She has EGD and colonoscopy are planned on 12/28, with enteroscopy.  No bleeding identified.  She still takes iron.    She has received her COVID vaccines.    Mild DOE- chronic.  Known mild  emphysema.  Follows with pulmonary annually.   Had dental work in 5/21.   Has large hiatal hernia had an operation to treat sx.  Seen by Dr. Kipp Brood.   Seen by CT surgery for reflux surgery in 2022: " She underwent a PEH replacement in April of 2022.  She states that she is doing very well.  Since surgery she is only had to use her reflux medication once but this was after spaghetti with a lot of spicy flakes on it.  She denies any other episodes of reflux, and has not had any dysphagia."  Has had some back pain after yoga classes.  She is back to work at [employer] two days a week.    Walking the treadmill daily.   Denies : Chest pain. Dizziness. Leg edema. Nitroglycerin use. Orthopnea. Palpitations. Paroxysmal nocturnal dyspnea. Shortness of breath. Syncope.    Past Medical History:  Diagnosis Date   Abdominal bruit 04/2017   Aortic u/s showed NO ANEURISM.   Allergy    SEASONAL   Anemia    low iron   Anxiety    BCC (basal cell carcinoma), arm, right    Carotid artery occlusion    Cataract    Colon cancer screening 05/2017  Colonoscopy reportedly normal approx 2008.  Cologuard NEG--repeat 3 yrs.   COPD (chronic obstructive pulmonary disease) ([place])    DOE, worse since 11/2018.  Stress test neg 01/2019.  Albut x 1 inh prn helpful some.  Pulm 06/2019, PFTs not diag of COPD, DOE likely from deconditioning.   Essential hypertension, benign 05/22/2014   GERD (gastroesophageal reflux disease)    H/O hiatal hernia 02/2019   EGD-->moderate size->surgery recommended but pt declined.   Hematochezia 2020/21   internal hemorrhoids   History of iron deficiency anemia 07/2019   Dr. Bryan Lemma EGD and colonoscopy, some anorectal muscosal ulceration, o/w just mild chronic gastritis.   Also could be poor absorption due to chronic high dose acid suppression. Hb/iron normalized aft iron infus and ongoing oral iron.   History of kidney stones    one time   Melanoma [facility])    pt denies    Mitral  valve prolapse    Mixed hyperlipidemia 05/22/2014   OAB (overactive bladder)    detrol LA from urol helpful   Osteopenia 06/06/2016   05/2016 DEXA T-score -1.5.  06/2018 DEXA T score -1.1   Peripheral vascular disease, unspecified ([place]) 05/22/2014   innominate and carotid.  U/S f/u 12/2016 showed no signif change compared to 2017--vascular recommended repeat 1 yr.  Doing well as of 08/2018 cardiol f/u-->continued on ASA and Plavix. F/u 08/2019->ASA d/c'd, plavix continued.   PONV (postoperative nausea and vomiting)    Pre-diabetes    Prediabetes 2018   2018 A1c 6.3%.  05/2018 A1c 6.1%. 07/2019 A1c 6.4%.   Reflux esophagitis    Stroke [facility]) 2006   Mini  Stroke- not major   Tobacco abuse, in remission    quit 2009    Past Surgical History:  Procedure Laterality Date   ABDOMINAL HYSTERECTOMY  1997   CARDIAC CATHETERIZATION  01/2012   NORMAL   CARDIOVASCULAR STRESS TEST  02/05/2019   NORMAL.  EF normal.   CAROTID ENDARTERECTOMY  08/21/2007   right - Dr. Amedeo Plenty   CHOLECYSTECTOMY  2000   COLONOSCOPY  approx 2008; 09/23/19   2008 (Dr. Lenon Ahmadi pt->normal.  08/2019 adenoma, some friable areas of colon + anorectal mucosa inflam w/ ulceration, +diverticulosis. Recall 5 yrs   DEXA  06/06/2016   05/2016 T-score -1.5.  06/2018 T score -1.1.  Repeat 2 yrs.   ESOPHAGOGASTRODUODENOSCOPY  03/11/2019; 09/23/19   02/2019; 5 cm hiatal hernia; GI suggested surgical repair due to intractable GER.  08/2019 mild chronic gastritis, h pylori neg, multiple benign bx's including duodenum neg for celiac changes.   ESOPHAGOGASTRODUODENOSCOPY N/A 01/18/2021   Procedure: ESOPHAGOGASTRODUODENOSCOPY (EGD);  Surgeon: [name], MD;  Location: [place];  Service: Thoracic;  Laterality: N/A;   Incision and drainage of left thenar space abscess  02/23/15   s/p cat bite (Dr. Amedeo Plenty)   innominate stent  2007   12/2014 u/s showed patent stent in innominate and L common carotid.  L prox int carotid 40-59% stenosis    LDCT for lung ca screening  06/2019   NEG->rpt 1 yr   Left common carotid stent  2006 and 2007   "                    "                  "                          "                             "  TRANSTHORACIC ECHOCARDIOGRAM  2007   NORMAL   US CAROTID DOPPLER BILATERAL ([facility] HX)  06/2019   left stent w/out stenosis.  R ICA sten 40-50% obst-stable. Rpt 1 yr.   XI ROBOTIC ASSISTED PARAESOPHAGEAL HERNIA REPAIR N/A 01/18/2021   Procedure: XI ROBOTIC ASSISTED PARAESOPHAGEAL HERNIA REPAIR WITH FUNDOPLICATION WITH MESH;  Surgeon: [name], MD;  Location: [facility];  Service: Thoracic;  Laterality: N/A;     Current Outpatient Medications  Medication Sig Dispense Refill   albuterol (VENTOLIN HFA) 108 (90 Base) MCG/ACT inhaler Inhale 2 puffs into the lungs every 6 (six) hours as needed for wheezing or shortness of breath. 8 g 6   atorvastatin (LIPITOR) 80 MG tablet TAKE ONE TABLET BY MOUTH DAILY 90 tablet 1   cholecalciferol (VITAMIN D) 1000 UNITS tablet Take 1,000 Units by mouth daily.     clopidogrel (PLAVIX) 75 MG tablet TAKE ONE TABLET BY MOUTH DAILY 90 tablet 1   diazepam (VALIUM) 2 MG tablet Take 1 tablet (2 mg total) by mouth 2 (two) times daily. 60 tablet 5   estradiol (ESTRACE) 0.1 MG/GM vaginal cream Place 1 Applicatorful vaginally 2 (two) times a week.     ezetimibe (ZETIA) 10 MG tablet TAKE ONE TABLET BY MOUTH EVERY DAY 90 tablet 3   ferrous sulfate 325 (65 FE) MG tablet Take 325 mg by mouth daily.      fexofenadine (ALLEGRA) 180 MG tablet Take 180 mg by mouth daily. Take 1 tablet daily for allergies     fluticasone (FLONASE) 50 MCG/ACT nasal spray Place 2 sprays into both nostrils daily. 16 g 4   lisinopril (ZESTRIL) 5 MG tablet TAKE ONE TABLET BY MOUTH DAILY 90 tablet 1   Magnesium 500 MG CAPS Take 500 mg by mouth daily.     nitroGLYCERIN (NITROSTAT) 0.4 MG SL tablet Place 1 tablet (0.4 mg total) under the tongue every 5 (five) minutes as needed for chest pain. 25 tablet 6    Omega-3 Fatty Acids (FISH OIL) 1000 MG CAPS Take 2,000 mg by mouth daily.      Probiotic Product (PROBIOTIC DAILY PO) Take 1 capsule by mouth daily.     tolterodine (DETROL LA) 4 MG 24 hr capsule Take 4 mg by mouth daily.     No current facility-administered medications for this visit.    Allergies:   Doxycycline, Fentanyl, Fluocinolone, Meperidine, Penicillins, Breo ellipta [fluticasone furoate-vilanterol], and Imdur [isosorbide nitrate]    Social History:  The patient  reports that she quit smoking about 13 years ago. Her smoking use included cigarettes. She has a 47.50 pack-year smoking history. She has never used smokeless tobacco. She reports that she does not drink alcohol and does not use drugs.   Family History:  The patient's family history includes CAD in her mother and paternal grandfather; Heart attack in her mother; Heart disease in her mother; Heart murmur in her sister; Hyperlipidemia in her father and mother; Hypertension in her father and mother; Osteoporosis in her paternal grandmother; Pneumonia in her father.    ROS:  Please see the history of present illness.   Otherwise, review of systems are positive for decreased reflux.   All other systems are reviewed and negative.    PHYSICAL EXAM: VS:  BP 110/72   Pulse 77   Ht '5\' 4"'$  (1.626 m)   Wt 132 lb 12.8 oz (60.2 kg)   SpO2 93%   BMI 22.80 kg/m  , BMI Body mass index is 22.8 kg/m. GEN: Well  nourished, well developed, in no acute distress HEENT: normal Neck: no JVD, carotid bruits, or masses Cardiac: RRR; no murmurs, rubs, or gallops,no edema  Respiratory:  clear to auscultation bilaterally, normal work of breathing GI: soft, nontender, nondistended, + BS MS: no deformity or atrophy; no nonhealing sores noted on her feet Skin: warm and dry, no rash Neuro:  Strength and sensation are intact Psych: euthymic mood, full affect    Recent Labs: 07/31/2020: TSH 0.84 01/19/2021: BUN 10; Creatinine, Ser 0.79; Hemoglobin  13.4; Platelets 215; Potassium 4.6; Sodium 138 04/01/2021: ALT 33   Lipid Panel    Component Value Date/Time   CHOL 127 04/01/2021 0851   TRIG 158.0 (H) 04/01/2021 0851   HDL 49.30 04/01/2021 0851   CHOLHDL 3 04/01/2021 0851   VLDL 31.6 04/01/2021 0851   LDLCALC 46 04/01/2021 0851   LDLDIRECT 134.0 01/28/2021 0923     Other studies Reviewed: Additional studies/ records that were reviewed today with results demonstrating: LDL 46 in 04/01/2021.   ASSESSMENT AND PLAN:  PAD: s/p subclavian stent. Moderate RCA lesion by CTA.  Negative CT FFR.  Continue aggressive secondary prevention.  No lower extremity claudication symptoms. SHOB: CTA coronaries was clear prior to reflux surgery.  Seeing pulmonary and getting annual screening CT.  Hyperlipidemia: The current medical regimen is effective;  continue present plan and medications.  Whole food, plant-based diet.  Avoid processed foods. HTN: The current medical regimen is effective;  continue present plan and medications. Aortic atherosclerosis: Continue secondary prevention.  High potency statin therapy.  Normal LFTs.  No nonhealing sores on feet.    Current medicines are reviewed at length with the patient today.  The patient concerns regarding her medicines were addressed.  The following changes have been made:  No change  Labs/ tests ordered today include:  No orders of the defined types were placed in this encounter.   Recommend 150 minutes/week of aerobic exercise Low fat, low carb, high fiber diet recommended  Disposition:   FU in 1 year   Signed, Larae Grooms, MD  06/09/2021 9:08 AM    [facility] [place], [place], [place]  [address] Phone: [phone]; Fax: [phone]

## 2021-06-09 ENCOUNTER — Ambulatory Visit: Payer: PPO | Admitting: Interventional Cardiology

## 2021-06-09 ENCOUNTER — Encounter: Payer: Self-pay | Admitting: Interventional Cardiology

## 2021-06-09 ENCOUNTER — Other Ambulatory Visit: Payer: Self-pay

## 2021-06-09 VITALS — BP 110/72 | HR 77 | Ht 64.0 in | Wt 132.8 lb

## 2021-06-09 DIAGNOSIS — I700 Atherosclerosis of aorta: Secondary | ICD-10-CM

## 2021-06-09 DIAGNOSIS — I10 Essential (primary) hypertension: Secondary | ICD-10-CM | POA: Diagnosis not present

## 2021-06-09 DIAGNOSIS — E782 Mixed hyperlipidemia: Secondary | ICD-10-CM | POA: Diagnosis not present

## 2021-06-09 DIAGNOSIS — I739 Peripheral vascular disease, unspecified: Secondary | ICD-10-CM

## 2021-06-09 NOTE — Patient Instructions (Signed)

## 2021-06-29 DIAGNOSIS — H40013 Open angle with borderline findings, low risk, bilateral: Secondary | ICD-10-CM | POA: Diagnosis not present

## 2021-06-29 DIAGNOSIS — H18513 Endothelial corneal dystrophy, bilateral: Secondary | ICD-10-CM | POA: Diagnosis not present

## 2021-06-29 DIAGNOSIS — H2512 Age-related nuclear cataract, left eye: Secondary | ICD-10-CM | POA: Diagnosis not present

## 2021-06-29 DIAGNOSIS — H43813 Vitreous degeneration, bilateral: Secondary | ICD-10-CM | POA: Diagnosis not present

## 2021-06-29 DIAGNOSIS — Z961 Presence of intraocular lens: Secondary | ICD-10-CM | POA: Diagnosis not present

## 2021-07-02 ENCOUNTER — Ambulatory Visit
Admit: 2021-07-02 | Discharge: 2021-07-02 | Disposition: A | Discharge status: Home / Self Care | Payer: PPO | Attending: Family Medicine | Admitting: Family Medicine

## 2021-07-02 ENCOUNTER — Other Ambulatory Visit: Payer: Self-pay

## 2021-07-02 DIAGNOSIS — M8580 Other specified disorders of bone density and structure, unspecified site: Secondary | ICD-10-CM

## 2021-07-02 DIAGNOSIS — Z780 Asymptomatic menopausal state: Secondary | ICD-10-CM | POA: Diagnosis not present

## 2021-07-02 DIAGNOSIS — E348 Other specified endocrine disorders: Secondary | ICD-10-CM

## 2021-07-02 DIAGNOSIS — M8589 Other specified disorders of bone density and structure, multiple sites: Secondary | ICD-10-CM | POA: Diagnosis not present

## 2021-07-04 ENCOUNTER — Other Ambulatory Visit: Payer: Self-pay

## 2021-07-04 DIAGNOSIS — I6523 Occlusion and stenosis of bilateral carotid arteries: Secondary | ICD-10-CM

## 2021-07-05 ENCOUNTER — Encounter: Payer: Self-pay | Admitting: Family Medicine

## 2021-07-09 ENCOUNTER — Other Ambulatory Visit: Payer: Self-pay | Admitting: Family Medicine

## 2021-07-09 NOTE — Telephone Encounter (Addendum)
RF request for atorvastatin LOV:01/28/21 Next ov: 08/03/21 Last written:02/11/21(90,1)   Requesting: diazepam Contract:01/29/20 UDS:07/31/20 Last Visit:01/28/21 Next Visit:08/03/21 Last Refill:01/28/21(60,5)

## 2021-07-20 ENCOUNTER — Ambulatory Visit: Payer: PPO | Admitting: Physician Assistant

## 2021-07-20 ENCOUNTER — Ambulatory Visit (HOSPITAL_COMMUNITY)
Admission: RE | Admit: 2021-07-20 | Discharge: 2021-07-20 | Disposition: A | Discharge status: Home / Self Care | Payer: PPO | Source: Ambulatory Visit | Attending: Vascular Surgery | Admitting: Vascular Surgery

## 2021-07-20 ENCOUNTER — Other Ambulatory Visit: Payer: Self-pay

## 2021-07-20 VITALS — BP 130/72 | HR 65 | Temp 97.3°F | Resp 16 | Ht 64.0 in | Wt 134.0 lb

## 2021-07-20 DIAGNOSIS — Z9889 Other specified postprocedural states: Secondary | ICD-10-CM

## 2021-07-20 DIAGNOSIS — I6523 Occlusion and stenosis of bilateral carotid arteries: Secondary | ICD-10-CM | POA: Diagnosis not present

## 2021-07-20 NOTE — Progress Notes (Signed)
Carotid Artery Follow-Up   VASCULAR SURGERY ASSESSMENT & PLAN:   [name] is a 70 y.o. female who presents for follow up of carotid stenosis. She has remote history of left common carotid artery stent 03/31/05, innominate artery stent 06/23/06, and right carotid endarterectomy 08/21/07 by Dr. Amedeo Plenty. She has a history of TIAs but no stroke.   Bilateral carotid artery stenosis: The patient has no symptoms referable to carotid artery stenosis.  Duplex examination today is stable as compared to 1 year ago.  We reviewed the signs and symptoms of stroke/TIA and advised the patient to call EMS should these occur.   Continue optimal medical management of diabetes, hypertension and follow-up with primary care physician. Encouraged continuation of complete smoking cessation. Continue the following medications: statin and Plavix. Follow-up in 1 year with carotid duplex ultrasound.  SUBJECTIVE:   The patient denies monocular blindness, slurred speech, facial drooping, extremity weakness or numbness.  PHYSICAL EXAM:   Vitals:   07/20/21 1024 07/20/21 1031  BP: 128/75 130/72  Pulse: 65   Resp: 16   Temp: (!) 97.3 F (36.3 C)   TempSrc: Temporal   SpO2: 97%   Weight: 134 lb (60.8 kg)   Height: 5\' 4"  (1.626 m)     General appearance: Well-developed, well-nourished in no apparent distress Neurologic: Alert and oriented x4, tongue is midline, face symmetric, speech fluent, 5 out of 5 bilateral upper extremity grip strength, triceps and biceps strength.    Cardiovascular: Heart rate and rhythm are regular.  2+ radial pulses. No carotid bruits. Respirations: Nonlabored   NON-INVASIVE VASCULAR STUDIES   07/20/2021 Right Carotid Findings:  +----------+--------+--------+--------+------------------+--------+            PSV cm/sEDV cm/sStenosisPlaque DescriptionComments  +----------+--------+--------+--------+------------------+--------+  CCA Prox  99      17                                           +----------+--------+--------+--------+------------------+--------+  CCA Mid   115     24                                          +----------+--------+--------+--------+------------------+--------+  CCA Distal111     23              heterogenous                +----------+--------+--------+--------+------------------+--------+  ICA Prox  85      17      Normal                              +----------+--------+--------+--------+------------------+--------+  ICA Mid   81      24                                          +----------+--------+--------+--------+------------------+--------+  ICA Distal126     35                                          +----------+--------+--------+--------+------------------+--------+  ECA  106     12                                          +----------+--------+--------+--------+------------------+--------+   +----------+--------+-------+----------------+-------------------+            PSV cm/sEDV cmsDescribe        Arm Pressure (mmHG)  +----------+--------+-------+----------------+-------------------+  Subclavian229            Multiphasic, WNL                     +----------+--------+-------+----------------+-------------------+   +---------+--------+--+--------+--+---------+  VertebralPSV cm/s35EDV cm/s12Antegrade  +---------+--------+--+--------+--+---------+      Left Carotid Findings:  +----------+--------+--------+--------+------------------+--------+            PSV cm/sEDV cm/sStenosisPlaque DescriptionComments  +----------+--------+--------+--------+------------------+--------+  CCA Prox  112     20                                          +----------+--------+--------+--------+------------------+--------+  CCA Mid   128     27              heterogenous                +----------+--------+--------+--------+------------------+--------+   CCA Distal103     22              heterogenous                +----------+--------+--------+--------+------------------+--------+  ICA Prox  116     29              heterogenous                +----------+--------+--------+--------+------------------+--------+  ICA Mid   159     47      40-59%  heterogenous                +----------+--------+--------+--------+------------------+--------+  ICA Distal105     32                                          +----------+--------+--------+--------+------------------+--------+  ECA       125     17                                          +----------+--------+--------+--------+------------------+--------+   +----------+--------+--------+----------------+-------------------+            PSV cm/sEDV cm/sDescribe        Arm Pressure (mmHG)  +----------+--------+--------+----------------+-------------------+  Subclavian170             Multiphasic, WNL                     +----------+--------+--------+----------------+-------------------+   +---------+--------+--+--------+--+---------+  VertebralPSV cm/s63EDV cm/s17Antegrade  +---------+--------+--+--------+--+---------+     Summary:  Right Carotid: There is no evidence of stenosis in the right ICA.   Left Carotid: Velocities in the left ICA are consistent with a 40-59%  stenosis. A left carotid stent is not visualized.   Vertebrals:  Bilateral vertebral arteries demonstrate antegrade flow.  Subclavians: Normal flow hemodynamics were seen in bilateral subclavian arteries.   *  See table(s) above for measurements and observations.      Preliminary     PROBLEM LIST:    The patient's past medical history, past surgical history, family history, social history, allergy list and medication list are reviewed.   CURRENT MEDS:    Current Outpatient Medications:    albuterol (VENTOLIN HFA) 108 (90 Base) MCG/ACT inhaler, Inhale 2 puffs into the  lungs every 6 (six) hours as needed for wheezing or shortness of breath., Disp: 8 g, Rfl: 6   atorvastatin (LIPITOR) 80 MG tablet, TAKE ONE TABLET BY MOUTH DAILY, Disp: 90 tablet, Rfl: 1   cholecalciferol (VITAMIN D) 1000 UNITS tablet, Take 1,000 Units by mouth daily., Disp: , Rfl:    clopidogrel (PLAVIX) 75 MG tablet, TAKE ONE TABLET BY MOUTH DAILY, Disp: 90 tablet, Rfl: 1   diazepam (VALIUM) 2 MG tablet, Take 1 tablet (2 mg total) by mouth 2 (two) times daily., Disp: 60 tablet, Rfl: 5   estradiol (ESTRACE) 0.1 MG/GM vaginal cream, Place 1 Applicatorful vaginally 2 (two) times a week., Disp: , Rfl:    ezetimibe (ZETIA) 10 MG tablet, TAKE ONE TABLET BY MOUTH EVERY DAY, Disp: 90 tablet, Rfl: 3   ferrous sulfate 325 (65 FE) MG tablet, Take 325 mg by mouth daily. , Disp: , Rfl:    fexofenadine (ALLEGRA) 180 MG tablet, Take 180 mg by mouth daily. Take 1 tablet daily for allergies, Disp: , Rfl:    fluticasone (FLONASE) 50 MCG/ACT nasal spray, Place 2 sprays into both nostrils daily., Disp: 16 g, Rfl: 4   lisinopril (ZESTRIL) 5 MG tablet, TAKE ONE TABLET BY MOUTH DAILY, Disp: 90 tablet, Rfl: 1   Magnesium 500 MG CAPS, Take 500 mg by mouth daily., Disp: , Rfl:    nitroGLYCERIN (NITROSTAT) 0.4 MG SL tablet, Place 1 tablet (0.4 mg total) under the tongue every 5 (five) minutes as needed for chest pain., Disp: 25 tablet, Rfl: 6   Omega-3 Fatty Acids (FISH OIL) 1000 MG CAPS, Take 2,000 mg by mouth daily. , Disp: , Rfl:    Probiotic Product (PROBIOTIC DAILY PO), Take 1 capsule by mouth daily., Disp: , Rfl:    tolterodine (DETROL LA) 4 MG 24 hr capsule, Take 4 mg by mouth daily., Disp: , Rfl:    REVIEW OF SYSTEMS:   [X]  denotes positive finding, [ ]  denotes negative finding Cardiac  Comments:  Chest pain or chest pressure:    Shortness of breath upon exertion:    Short of breath when lying flat:    Irregular heart rhythm:        Vascular    Pain in calf, thigh, or hip brought on by ambulation:    Pain  in feet at night that wakes you up from your sleep:     Blood clot in your veins:    Leg swelling:         Pulmonary    Oxygen at home:    Productive cough:     Wheezing:         Neurologic    Sudden weakness in arms or legs:     Sudden numbness in arms or legs:     Sudden onset of difficulty speaking or slurred speech:    Temporary loss of vision in one eye:     Problems with dizziness:         Gastrointestinal    Blood in stool:     Vomited blood:  Genitourinary    Burning when urinating:     Blood in urine:        Psychiatric    Major depression:         Hematologic    Bleeding problems:    Problems with blood clotting too easily:        Skin    Rashes or ulcers:        Constitutional    Fever or chills:     Barbie Banner, PA-C  Office: [phone] 07/20/2021 Dr. Roxanne Mins

## 2021-07-26 ENCOUNTER — Other Ambulatory Visit: Payer: Self-pay | Admitting: Family Medicine

## 2021-07-26 DIAGNOSIS — Z1231 Encounter for screening mammogram for malignant neoplasm of breast: Secondary | ICD-10-CM

## 2021-08-03 ENCOUNTER — Other Ambulatory Visit: Payer: Self-pay

## 2021-08-03 ENCOUNTER — Ambulatory Visit (INDEPENDENT_AMBULATORY_CARE_PROVIDER_SITE_OTHER): Payer: PPO | Admitting: Family Medicine

## 2021-08-03 ENCOUNTER — Encounter: Payer: Self-pay | Admitting: Family Medicine

## 2021-08-03 VITALS — BP 94/61 | HR 67 | Temp 97.5°F | Ht 63.75 in | Wt 134.8 lb

## 2021-08-03 DIAGNOSIS — M549 Dorsalgia, unspecified: Secondary | ICD-10-CM | POA: Diagnosis not present

## 2021-08-03 DIAGNOSIS — R7303 Prediabetes: Secondary | ICD-10-CM | POA: Diagnosis not present

## 2021-08-03 DIAGNOSIS — E7800 Pure hypercholesterolemia, unspecified: Secondary | ICD-10-CM

## 2021-08-03 DIAGNOSIS — Z0000 Encounter for general adult medical examination without abnormal findings: Secondary | ICD-10-CM | POA: Diagnosis not present

## 2021-08-03 DIAGNOSIS — F411 Generalized anxiety disorder: Secondary | ICD-10-CM

## 2021-08-03 DIAGNOSIS — I10 Essential (primary) hypertension: Secondary | ICD-10-CM

## 2021-08-03 DIAGNOSIS — Z79899 Other long term (current) drug therapy: Secondary | ICD-10-CM | POA: Diagnosis not present

## 2021-08-03 LAB — COMPREHENSIVE METABOLIC PANEL
ALT: 28 U/L (ref 0–35)
AST: 28 U/L (ref 0–37)
Albumin: 4.5 g/dL (ref 3.5–5.2)
Alkaline Phosphatase: 89 U/L (ref 39–117)
BUN: 15 mg/dL (ref 6–23)
CO2: 31 mEq/L (ref 19–32)
Calcium: 10.1 mg/dL (ref 8.4–10.5)
Chloride: 103 mEq/L (ref 96–112)
Creatinine, Ser: 0.77 mg/dL (ref 0.40–1.20)
GFR: 78.26 mL/min (ref >60.00)
Glucose, Bld: 86 mg/dL (ref 70–99)
Potassium: 5.3 mEq/L — ABNORMAL HIGH (ref 3.5–5.1)
Sodium: 140 mEq/L (ref 135–145)
Total Bilirubin: 1.8 mg/dL — ABNORMAL HIGH (ref 0.2–1.2)
Total Protein: 6.7 g/dL (ref 6.0–8.3)

## 2021-08-03 LAB — CBC WITH DIFFERENTIAL/PLATELET
Basophils Absolute: 0 10*3/uL (ref 0.0–0.1)
Basophils Relative: 0.6 % (ref 0.0–3.0)
Eosinophils Absolute: 0.1 10*3/uL (ref 0.0–0.7)
Eosinophils Relative: 2.3 % (ref 0.0–5.0)
HCT: 44.3 % (ref 36.0–46.0)
Hemoglobin: 14.8 g/dL (ref 12.0–15.0)
Lymphocytes Relative: 38.8 % (ref 12.0–46.0)
Lymphs Abs: 2.5 10*3/uL (ref 0.7–4.0)
MCHC: 33.4 g/dL (ref 30.0–36.0)
MCV: 93 fl (ref 78.0–100.0)
Monocytes Absolute: 0.5 10*3/uL (ref 0.1–1.0)
Monocytes Relative: 7.9 % (ref 3.0–12.0)
Neutro Abs: 3.2 10*3/uL (ref 1.4–7.7)
Neutrophils Relative %: 50.4 % (ref 43.0–77.0)
Platelets: 274 10*3/uL (ref 150.0–400.0)
RBC: 4.76 Mil/uL (ref 3.87–5.11)
RDW: 13.5 % (ref 11.5–15.5)
WBC: 6.4 10*3/uL (ref 4.0–10.5)

## 2021-08-03 LAB — HEMOGLOBIN A1C: Hgb A1c MFr Bld: 6.1 % (ref 4.6–6.5)

## 2021-08-03 LAB — LIPID PANEL
Cholesterol: 118 mg/dL (ref 0–200)
HDL: 46.2 mg/dL (ref >39.00)
LDL Cholesterol: 52 mg/dL (ref 0–99)
NonHDL: 71.33
Total CHOL/HDL Ratio: 3
Triglycerides: 99 mg/dL (ref 0.0–149.0)
VLDL: 19.8 mg/dL (ref 0.0–40.0)

## 2021-08-03 LAB — TSH: TSH: 0.89 u[IU]/mL (ref 0.35–5.50)

## 2021-08-03 MED ORDER — FAMOTIDINE 40 MG PO TABS: 40.0000 mg | ORAL_TABLET | Freq: Every day | ORAL | 3 refills | Status: DC

## 2021-08-03 NOTE — Progress Notes (Signed)
Office Note 08/03/2021  CC:  Chief Complaint  Patient presents with   Annual Exam    Pt is fasting. Schedule for mammogram 12/13, flu shot given 07/01/21 and moderna booster 07/22/21.    HPI:  Patient is a 70 y.o. female who is here for annual health maintenance exam and f/u HTN, HLD, prediabetes, and GAD. A/P as of last visit: "1) HTN, well controlled on lisinopril 5mg  qd. Lytes/cr normal in hosp 2 wks ago.   2) HLD: atorva 80mg  qd long term, LDL goal is <70. Last LDL 83 six mo ago and she chose to try to improve diet/activity before a trial of add-on zetia. If LDL not at goal this time she is willing to add on zetia.   3) Prediabetes: diet and activity level good. A1c stable at 5.9% 2 wks ago.   4) GAD, with anxiety-related insomnia:  Doing well long term on diazepam, 2 mg qhs. CSC and UDS UTD. Diazepam 2mg , 1 bid, #60, RF x 5 ->eRx'd today.   5) Paraesophageal hernia repair 10 d/a. Recovering well at this time.   Gradually advancing diet as per surgeon's rec's."  INTERIM HX: [name] is doing pretty well. She notes some return of postprandial substernal burning.  Nearly every time she eats now.  Appetite is good and she has no dysphagia.  No nausea or regurgitation.  She is not taking anything for reflux since having paraesophageal hernia repair 6 months ago.  Reports recent back pain in mid back level on the left extending up into the area between the scapula.  Significantly worse when up on back and walking around more.  She has a long history of poor posture.  She has started waiting tables again.  The pain does not radiate across her sides or into the shoulders or legs.  She takes Tylenol, avoids NSAIDs.  No home blood pressure monitoring to report. Taking statin and Zetia daily.  Anxiety level is stable.  Continues to milligrams diazepam twice a day. PMP AWARE reviewed today: most recent rx for diazepam 2mg  was filled 07/09/21, # 27, rx by me. No red flags.   Past  Medical History:  Diagnosis Date   Abdominal bruit 04/2017   Aortic u/s showed NO ANEURISM.   Allergy    SEASONAL   Anemia    low iron   Anxiety    Carotid artery occlusion    hx L carotid stent   Cataract    COPD (chronic obstructive pulmonary disease) (HCC)    DOE, worse since 11/2018.  Stress test neg 01/2019.  Albut x 1 inh prn helpful some.  Pulm 06/2019, PFTs not diag of COPD, DOE likely from deconditioning.   Essential hypertension, benign 05/22/2014   GERD (gastroesophageal reflux disease)    + hx of esophagitis   H/O hiatal hernia 02/2019   EGD-->moderate size->surgery recommended but pt declined.   Hematochezia 2020/21   internal hemorrhoids   History of iron deficiency anemia 07/2019   Dr. Bryan Lemma EGD and colonoscopy, some anorectal muscosal ulceration, o/w just mild chronic gastritis.   Also could be poor absorption due to chronic high dose acid suppression. Hb/iron normalized aft iron infus and ongoing oral iron.   History of kidney stones    one time   Mitral valve prolapse    Mixed hyperlipidemia 05/22/2014   OAB (overactive bladder)    detrol LA from urol helpful   Osteopenia 06/06/2016   05/2016 DEXA T-score -1.5.  06/2018 DEXA T score -1.1.  T score -1.2 06/2021   Peripheral vascular disease, unspecified ([place]) 05/22/2014   innominate and carotid.  U/S f/u 12/2016 showed no signif change compared to 2017--vascular recommended repeat 1 yr.  Doing well as of 08/2018 cardiol f/u-->continued on ASA and Plavix. F/u 08/2019->ASA d/c'd, plavix continued.   Prediabetes 2018   2018 A1c 6.3%.  05/2018 A1c 6.1%. 07/2019 A1c 6.4%.   TIA (transient ischemic attack) 2006   Tobacco abuse, in remission    quit 2009    Past Surgical History:  Procedure Laterality Date   ABDOMINAL HYSTERECTOMY  1997   CARDIAC CATHETERIZATION  01/2012   NORMAL   CARDIOVASCULAR STRESS TEST  02/05/2019   NORMAL.  EF normal.   CAROTID ENDARTERECTOMY  08/21/2007   right - Dr. Amedeo Plenty    CHOLECYSTECTOMY  2000   COLONOSCOPY  approx 2008; 09/23/19   2008 (Dr. Lenon Ahmadi pt->normal.  08/2019 adenoma, some friable areas of colon + anorectal mucosa inflam w/ ulceration, +diverticulosis. Recall 5 yrs   DEXA  06/06/2016   05/2016 T-score -1.5.  06/2018 T score -1.1.  T score -1.2 06/2021.   ESOPHAGOGASTRODUODENOSCOPY  03/11/2019; 09/23/19   02/2019; 5 cm hiatal hernia; GI suggested surgical repair due to intractable GER.  08/2019 mild chronic gastritis, h pylori neg, multiple benign bx's including duodenum neg for celiac changes.   ESOPHAGOGASTRODUODENOSCOPY N/A 01/18/2021   Procedure: ESOPHAGOGASTRODUODENOSCOPY (EGD);  Surgeon: [name], MD;  Location: [facility];  Service: Thoracic;  Laterality: N/A;   Incision and drainage of left thenar space abscess  02/23/2015   s/p cat bite (Dr. Amedeo Plenty)   innominate stent  2007   12/2014 u/s showed patent stent in innominate and L common carotid.  L prox int carotid 40-59% stenosis   LDCT for lung ca screening  06/2019   NEG->rpt 1 yr   Left common carotid stent  2006 and 2007   "                    "                  "                          "                             "   TRANSTHORACIC ECHOCARDIOGRAM  2007   NORMAL   US CAROTID DOPPLER BILATERAL ([facility] HX)  06/2019   left stent w/out stenosis.  R ICA sten 40-50% obst-stable.  07/20/21 R clear, L 40-59%, normal vert and subclav flow.   XI ROBOTIC ASSISTED PARAESOPHAGEAL HERNIA REPAIR N/A 01/18/2021   Procedure: XI ROBOTIC ASSISTED PARAESOPHAGEAL HERNIA REPAIR WITH FUNDOPLICATION WITH MESH;  Surgeon: [name], MD;  Location: [facility];  Service: Thoracic;  Laterality: N/A;    Family History  Problem Relation Age of Onset   Heart attack Mother    CAD Mother    Heart disease Mother        Before age 55   Hypertension Mother    Hyperlipidemia Mother    Hypertension Father    Pneumonia Father    Hyperlipidemia Father    Heart murmur Sister    Osteoporosis Paternal  Grandmother    CAD Paternal Grandfather    Breast cancer Neg Hx    Colon cancer Neg Hx    Esophageal cancer Neg  Hx    Rectal cancer Neg Hx    Stomach cancer Neg Hx    Colon polyps Neg Hx     Social History   Socioeconomic History   Marital status: Widowed    Spouse name: Not on file   Number of children: Not on file   Years of education: Not on file   Highest education level: Not on file  Occupational History   Not on file  Tobacco Use   Smoking status: Former    Packs/day: 1.25    Years: 38.00    Pack years: 47.50    Types: Cigarettes    Quit date: 09/27/2007    Years since quitting: 13.8   Smokeless tobacco: Never  Vaping Use   Vaping Use: Never used  Substance and Sexual Activity   Alcohol use: No   Drug use: No   Sexual activity: Not on file  Other Topics Concern   Not on file  Social History Narrative   Widowed as of 2015/01/06 (husband died of viral illness while out of town in [place]), has one daughter.   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   Tobacco: 20 pack-yr hx, quit 2008-01-06.   Alcohol: rare.   No hx of alc or drug problems.   Exercise: walks 2.26miles on treadmill daily.   Social Determinants of Health   Financial Resource Strain: Low Risk    Difficulty of Paying Living Expenses: Not hard at all  Food Insecurity: No Food Insecurity   Worried About Charity fundraiser in the Last Year: Never true   [place] in the Last Year: Never true  Transportation Needs: No Transportation Needs   Lack of Transportation (Medical): No   Lack of Transportation (Non-Medical): No  Physical Activity: Sufficiently Active   Days of Exercise per Week: 7 days   Minutes of Exercise per Session: 40 min  Stress: No Stress Concern Present   Feeling of Stress : Not at all  Social Connections: Moderately Isolated   Frequency of Communication with Friends and Family: More than three times a week   Frequency of Social  Gatherings with Friends and Family: More than three times a week   Attends Religious Services: Never   Marine scientist or Organizations: Yes   Attends Music therapist: More than 4 times per year   Marital Status: Widowed  Human resources officer Violence: Not At Risk   Fear of Current or Ex-Partner: No   Emotionally Abused: No   Physically Abused: No   Sexually Abused: No    Outpatient Medications Prior to Visit  Medication Sig Dispense Refill   atorvastatin (LIPITOR) 80 MG tablet TAKE ONE TABLET BY MOUTH DAILY 90 tablet 1   cholecalciferol (VITAMIN D) 1000 UNITS tablet Take 1,000 Units by mouth daily.     clopidogrel (PLAVIX) 75 MG tablet TAKE ONE TABLET BY MOUTH DAILY 90 tablet 1   diazepam (VALIUM) 2 MG tablet Take 1 tablet (2 mg total) by mouth 2 (two) times daily. 60 tablet 5   estradiol (ESTRACE) 0.1 MG/GM vaginal cream Place 1 Applicatorful vaginally 2 (two) times a week.     ezetimibe (ZETIA) 10 MG tablet TAKE ONE TABLET BY MOUTH EVERY DAY 90 tablet 3   ferrous sulfate 325 (65 FE) MG tablet Take 325 mg by mouth daily.      fexofenadine (ALLEGRA) 180 MG tablet Take 180 mg by mouth daily.  Take 1 tablet daily for allergies     fluticasone (FLONASE) 50 MCG/ACT nasal spray Place 2 sprays into both nostrils daily. 16 g 4   lisinopril (ZESTRIL) 5 MG tablet TAKE ONE TABLET BY MOUTH DAILY 90 tablet 1   Magnesium 500 MG CAPS Take 500 mg by mouth daily.     Omega-3 Fatty Acids (FISH OIL) 1000 MG CAPS Take 2,000 mg by mouth daily.      Probiotic Product (PROBIOTIC DAILY PO) Take 1 capsule by mouth daily.     tolterodine (DETROL LA) 4 MG 24 hr capsule Take 4 mg by mouth daily.     albuterol (VENTOLIN HFA) 108 (90 Base) MCG/ACT inhaler Inhale 2 puffs into the lungs every 6 (six) hours as needed for wheezing or shortness of breath. (Patient not taking: Reported on 08/03/2021) 8 g 6   nitroGLYCERIN (NITROSTAT) 0.4 MG SL tablet Place 1 tablet (0.4 mg total) under the tongue every 5  (five) minutes as needed for chest pain. (Patient not taking: Reported on 08/03/2021) 25 tablet 6   No facility-administered medications prior to visit.    Allergies  Allergen Reactions   Doxycycline Shortness Of Breath and Swelling    Lips swelling and short of breath   Fentanyl Nausea And Vomiting    Pt states all pain medications cause N/V for her Pt states all pain medications cause N/V for her   Fluocinolone Nausea And Vomiting   Meperidine Nausea And Vomiting   Penicillins Hives   Breo Ellipta [Fluticasone Furoate-Vilanterol] Other (See Comments)    Lower extremity weakness, lightheadedness    Imdur [Isosorbide Nitrate] Nausea And Vomiting    ROS Review of Systems  Constitutional:  Negative for appetite change, chills, fatigue and fever.  HENT:  Negative for congestion, dental problem, ear pain and sore throat.   Eyes:  Negative for discharge, redness and visual disturbance.  Respiratory:  Negative for cough, chest tightness, shortness of breath and wheezing.   Cardiovascular:  Negative for chest pain, palpitations and leg swelling.  Gastrointestinal:  Negative for abdominal pain, blood in stool, diarrhea, nausea and vomiting.  Genitourinary:  Negative for difficulty urinating, dysuria, flank pain, frequency, hematuria and urgency.  Musculoskeletal:  Positive for back pain (as per hpi). Negative for arthralgias, joint swelling, myalgias and neck stiffness.  Skin:  Negative for pallor and rash.  Neurological:  Negative for dizziness, speech difficulty, weakness and headaches.  Hematological:  Negative for adenopathy. Does not bruise/bleed easily.  Psychiatric/Behavioral:  Negative for confusion and sleep disturbance. The patient is not nervous/anxious.    PE; Vitals with BMI 08/03/2021 07/20/2021 07/20/2021  Height 5' 3.75" - 5\' 4"   Weight 134 lbs 13 oz - 134 lbs  BMI 40.97 - 35.32  Systolic 94 992 426  Diastolic 61 72 75  Pulse 67 - 65   Exam chaperoned by Deveron Furlong, CMA.  Gen: Alert, well appearing.  Patient is oriented to person, place, time, and situation. AFFECT: pleasant, lucid thought and speech. ENT: Ears: EACs clear, normal epithelium.  TMs with good light reflex and landmarks bilaterally.  Eyes: no injection, icteris, swelling, or exudate.  EOMI, PERRLA. Nose: no drainage or turbinate edema/swelling.  No injection or focal lesion.  Mouth: lips without lesion/swelling.  Oral mucosa pink and moist.  Dentition intact and without obvious caries or gingival swelling.  Oropharynx without erythema, exudate, or swelling.  Neck: supple/nontender.  No LAD, mass, or TM.  Carotid pulses 2+ bilaterally, without bruits. CV: RRR, no m/r/g.  LUNGS: CTA bilat, nonlabored resps, good aeration in all lung fields. ABD: soft, NT, ND, BS normal.  No hepatospenomegaly or mass.  No bruits. EXT: no clubbing, cyanosis, or edema.  Musculoskeletal: Back is nontender, normal alignment.  Posture is moderately slumped forward in a way that exaggerates the mild thoracic kyphosis and lumbar lordosis. no joint swelling, erythema, warmth, or tenderness.  ROM of all joints intact. Skin - no sores or suspicious lesions or rashes or color changes Neuro: CN 2-12 intact bilaterally, strength 5/5 in proximal and distal upper extremities and lower extremities bilaterally.  No sensory deficits.  No tremor.    No ataxia.    Pertinent labs:  Lab Results  Component Value Date   TSH 0.84 07/31/2020   Lab Results  Component Value Date   WBC 10.9 (H) 01/19/2021   HGB 13.4 01/19/2021   HCT 40.2 01/19/2021   MCV 93.1 01/19/2021   PLT 215 01/19/2021   Lab Results  Component Value Date   CREATININE 0.79 01/19/2021   BUN 10 01/19/2021   NA 138 01/19/2021   K 4.6 01/19/2021   CL 106 01/19/2021   CO2 25 01/19/2021   Lab Results  Component Value Date   ALT 33 04/01/2021   AST 30 04/01/2021   ALKPHOS 86 01/14/2021   BILITOT 1.9 (H) 01/14/2021   Lab Results  Component Value  Date   CHOL 127 04/01/2021   Lab Results  Component Value Date   HDL 49.30 04/01/2021   Lab Results  Component Value Date   LDLCALC 46 04/01/2021   Lab Results  Component Value Date   TRIG 158.0 (H) 04/01/2021   Lab Results  Component Value Date   CHOLHDL 3 04/01/2021   Lab Results  Component Value Date   HGBA1C 5.9 (H) 01/14/2021   ASSESSMENT AND PLAN:   1) HTN: Excellent control on lisinopril 5 mg a day. Lytes/cr today.  2) HLD: Doing well on 80 mg atorvastatin and Zetia 10 mg. FLP and hepatic panel today.  3) Prediabetes: She has been working some on diet.  She is walking 50 minutes every day. Fasting glucose and Hba1c today.  4) mid back pain: Musculoskeletal strain mainly due to poor posture.  Encouraged improvement of posture, abs strengthening, low back stretching, and heat application as needed.  May continue Tylenol as needed.  5) Health maintenance exam: Reviewed age and gender appropriate health maintenance issues (prudent diet, regular exercise, health risks of tobacco and excessive alcohol, use of seatbelts, fire alarms in home, use of sunscreen).  Also reviewed age and gender appropriate health screening as well as vaccine recommendations. Vaccines: Flu->UTD.  Otherwise UTD. Labs: fasting HP and Hba1c. Cervical ca screening:  pt is s/p hysterectomy for benign dx.  pt declines any further screening paps. Breast ca screening: mammogram scheduled for 09/07/21. Colon ca screening: recall 08/2024. Osteoporosis screening: 06/2021 DEXA w/osteopenia, plan rpt 06/2023. LUNG CA screening: due for rpt LDCT-->ordered already by [facility]. Pt no longer smokes.  6) GAD.  Stable.  Continue diazepam 2 mg twice a day as needed.  An After Visit Summary was printed and given to the patient.  FOLLOW UP:  Return in about 6 months (around 01/31/2022) for routine chronic illness f/u.  Signed:  Crissie Sickles, MD           08/03/2021

## 2021-08-03 NOTE — Patient Instructions (Signed)
Low Back Sprain or Strain Rehab Ask your health care provider which exercises are safe for you. Do exercises exactly as told by your health care provider and adjust them as directed. It is normal to feel mild stretching, pulling, tightness, or discomfort as you do these exercises. Stop right away if you feel sudden pain or your pain gets worse. Do not begin these exercises until told by your health care provider. Stretching and range-of-motion exercises These exercises warm up your muscles and joints and improve the movement and flexibility of your back. These exercises also help to relieve pain, numbness, and tingling. Lumbar rotation  Lie on your back on a firm bed or the floor with your knees bent. Straighten your arms out to your sides so each arm forms a 90-degree angle (right angle) with a side of your body. Slowly move (rotate) both of your knees to one side of your body until you feel a stretch in your lower back (lumbar). Try not to let your shoulders lift off the floor. Hold this position for __________ seconds. Tense your abdominal muscles and slowly move your knees back to the starting position. Repeat this exercise on the other side of your body. Repeat __________ times. Complete this exercise __________ times a day. Single knee to chest  Lie on your back on a firm bed or the floor with both legs straight. Bend one of your knees. Use your hands to move your knee up toward your chest until you feel a gentle stretch in your lower back and buttock. Hold your leg in this position by holding on to the front of your knee. Keep your other leg as straight as possible. Hold this position for __________ seconds. Slowly return to the starting position. Repeat with your other leg. Repeat __________ times. Complete this exercise __________ times a day. Prone extension on elbows  Lie on your abdomen on a firm bed or the floor (prone position). Prop yourself up on your elbows. Use your arms  to help lift your chest up until you feel a gentle stretch in your abdomen and your lower back. This will place some of your body weight on your elbows. If this is uncomfortable, try stacking pillows under your chest. Your hips should stay down, against the surface that you are lying on. Keep your hip and back muscles relaxed. Hold this position for __________ seconds. Slowly relax your upper body and return to the starting position. Repeat __________ times. Complete this exercise __________ times a day. Strengthening exercises These exercises build strength and endurance in your back. Endurance is the ability to use your muscles for a long time, even after they get tired. Pelvic tilt This exercise strengthens the muscles that lie deep in the abdomen. Lie on your back on a firm bed or the floor with your legs extended. Bend your knees so they are pointing toward the ceiling and your feet are flat on the floor. Tighten your lower abdominal muscles to press your lower back against the floor. This motion will tilt your pelvis so your tailbone points up toward the ceiling instead of pointing to your feet or the floor. To help with this exercise, you may place a small towel under your lower back and try to push your back into the towel. Hold this position for __________ seconds. Let your muscles relax completely before you repeat this exercise. Repeat __________ times. Complete this exercise __________ times a day. Alternating arm and leg raises  Get on your hands   and knees on a firm surface. If you are on a hard floor, you may want to use padding, such as an exercise mat, to cushion your knees. Line up your arms and legs. Your hands should be directly below your shoulders, and your knees should be directly below your hips. Lift your left leg behind you. At the same time, raise your right arm and straighten it in front of you. Do not lift your leg higher than your hip. Do not lift your arm higher  than your shoulder. Keep your abdominal and back muscles tight. Keep your hips facing the ground. Do not arch your back. Keep your balance carefully, and do not hold your breath. Hold this position for __________ seconds. Slowly return to the starting position. Repeat with your right leg and your left arm. Repeat __________ times. Complete this exercise __________ times a day. Abdominal set with straight leg raise  Lie on your back on a firm bed or the floor. Bend one of your knees and keep your other leg straight. Tense your abdominal muscles and lift your straight leg up, 4-6 inches (10-15 cm) off the ground. Keep your abdominal muscles tight and hold this position for __________ seconds. Do not hold your breath. Do not arch your back. Keep it flat against the ground. Keep your abdominal muscles tense as you slowly lower your leg back to the starting position. Repeat with your other leg. Repeat __________ times. Complete this exercise __________ times a day. Single leg lower with bent knees Lie on your back on a firm bed or the floor. Tense your abdominal muscles and lift your feet off the floor, one foot at a time, so your knees and hips are bent in 90-degree angles (right angles). Your knees should be over your hips and your lower legs should be parallel to the floor. Keeping your abdominal muscles tense and your knee bent, slowly lower one of your legs so your toe touches the ground. Lift your leg back up to return to the starting position. Do not hold your breath. Do not let your back arch. Keep your back flat against the ground. Repeat with your other leg. Repeat __________ times. Complete this exercise __________ times a day. Posture and body mechanics Good posture and healthy body mechanics can help to relieve stress in your body's tissues and joints. Body mechanics refers to the movements and positions of your body while you do your daily activities. Posture is part of body  mechanics. Good posture means: Your spine is in its natural S-curve position (neutral). Your shoulders are pulled back slightly. Your head is not tipped forward (neutral). Follow these guidelines to improve your posture and body mechanics in your everyday activities. Standing  When standing, keep your spine neutral and your feet about hip-width apart. Keep a slight bend in your knees. Your ears, shoulders, and hips should line up. When you do a task in which you stand in one place for a long time, place one foot up on a stable object that is 2-4 inches (5-10 cm) high, such as a footstool. This helps keep your spine neutral. Sitting  When sitting, keep your spine neutral and keep your feet flat on the floor. Use a footrest, if necessary, and keep your thighs parallel to the floor. Avoid rounding your shoulders, and avoid tilting your head forward. When working at a desk or a computer, keep your desk at a height where your hands are slightly lower than your elbows. Slide your   chair under your desk so you are close enough to maintain good posture. When working at a computer, place your monitor at a height where you are looking straight ahead and you do not have to tilt your head forward or downward to look at the screen. Resting When lying down and resting, avoid positions that are most painful for you. If you have pain with activities such as sitting, bending, stooping, or squatting, lie in a position in which your body does not bend very much. For example, avoid curling up on your side with your arms and knees near your chest (fetal position). If you have pain with activities such as standing for a long time or reaching with your arms, lie with your spine in a neutral position and bend your knees slightly. Try the following positions: Lying on your side with a pillow between your knees. Lying on your back with a pillow under your knees. Lifting  When lifting objects, keep your feet at least  shoulder-width apart and tighten your abdominal muscles. Bend your knees and hips and keep your spine neutral. It is important to lift using the strength of your legs, not your back. Do not lock your knees straight out. Always ask for help to lift heavy or awkward objects. This information is not intended to replace advice given to you by your health care provider. Make sure you discuss any questions you have with your health care provider. Document Revised: 11/30/2020 Document Reviewed: 11/30/2020 Elsevier Patient Education  2022 [employer]  

## 2021-08-04 ENCOUNTER — Encounter: Payer: Self-pay | Admitting: Family Medicine

## 2021-08-05 ENCOUNTER — Other Ambulatory Visit: Payer: Self-pay | Admitting: Family Medicine

## 2021-08-05 NOTE — Telephone Encounter (Signed)
RF request for atorvastatin LOV:08/03/21 Next ov: 02/01/22 Last written:02/11/21(90,1)  Requesting: diazepam Contract: 01/29/20 UDS:07/31/20 Last Visit:08/03/21 Next Visit:02/01/22 Last Refill:01/28/21(60,5)  Please Advise. Meds pending

## 2021-08-09 ENCOUNTER — Other Ambulatory Visit: Payer: Self-pay | Admitting: Family Medicine

## 2021-08-24 ENCOUNTER — Telehealth: Payer: Self-pay | Admitting: *Deleted

## 2021-08-24 NOTE — Telephone Encounter (Signed)
Patient contacted the office c/o indigestion and heartburn. Patient is s/p robotic PEH repair 01/18/21 by Dr. Kipp Brood. Patient states she "burped" yesterday and fluid regurgitated into her throat. She has not been taking any medications for reflux since surgery. She was seen by her PCP early November who recommend she resume medications. Patient requesting Dr. Abran Duke input. Message sent to Dr. Kipp Brood per patient request. Patient also c/o pain between her shoulder blades stating she went back to work two days a week after being out of work since covid. Patient states her pain occurs after activity. Patient states she has discussed this with her PCP. Advised patient to follow PCP's recommendations. Patient will be notified of Dr. Abran Duke response. Patient verbalizes understanding.

## 2021-08-25 ENCOUNTER — Other Ambulatory Visit: Payer: Self-pay | Admitting: Thoracic Surgery (Cardiothoracic Vascular Surgery)

## 2021-08-25 ENCOUNTER — Other Ambulatory Visit: Payer: Self-pay | Admitting: *Deleted

## 2021-08-25 ENCOUNTER — Telehealth: Payer: Self-pay | Admitting: *Deleted

## 2021-08-25 DIAGNOSIS — K449 Diaphragmatic hernia without obstruction or gangrene: Secondary | ICD-10-CM

## 2021-08-25 NOTE — Telephone Encounter (Signed)
Per Dr. Kipp Brood, Esophagram ordered for patient with virtual follow up. Patient aware of all appts.

## 2021-08-26 DIAGNOSIS — Z1231 Encounter for screening mammogram for malignant neoplasm of breast: Secondary | ICD-10-CM

## 2021-08-30 ENCOUNTER — Ambulatory Visit
Admission: RE | Admit: 2021-08-30 | Discharge: 2021-08-30 | Disposition: A | Discharge status: Home / Self Care | Payer: PPO | Source: Ambulatory Visit | Attending: Thoracic Surgery (Cardiothoracic Vascular Surgery) | Admitting: Thoracic Surgery (Cardiothoracic Vascular Surgery)

## 2021-08-30 ENCOUNTER — Other Ambulatory Visit: Payer: Self-pay | Admitting: Thoracic Surgery (Cardiothoracic Vascular Surgery)

## 2021-08-30 DIAGNOSIS — K224 Dyskinesia of esophagus: Secondary | ICD-10-CM | POA: Diagnosis not present

## 2021-08-30 DIAGNOSIS — K449 Diaphragmatic hernia without obstruction or gangrene: Secondary | ICD-10-CM

## 2021-09-01 ENCOUNTER — Ambulatory Visit: Payer: PPO

## 2021-09-03 ENCOUNTER — Ambulatory Visit: Payer: PPO | Admitting: Thoracic Surgery (Cardiothoracic Vascular Surgery)

## 2021-09-03 ENCOUNTER — Other Ambulatory Visit: Payer: Self-pay

## 2021-09-03 DIAGNOSIS — K449 Diaphragmatic hernia without obstruction or gangrene: Secondary | ICD-10-CM

## 2021-09-03 NOTE — Progress Notes (Signed)
     [address]       [place] [address]             [phone]       Patient: Home Provider: Office Consent for Telemedicine visit obtained.  Today's visit was completed via a real-time telehealth (see specific modality noted below). The patient/authorized person provided oral consent at the time of the visit to engage in a telemedicine encounter with the present provider at [facility]. The patient/authorized person was informed of the potential benefits, limitations, and risks of telemedicine. The patient/authorized person expressed understanding that the laws that protect confidentiality also apply to telemedicine. The patient/authorized person acknowledged understanding that telemedicine does not provide emergency services and that he or she would need to call 911 or proceed to the nearest hospital for help if such a need arose.   Total time spent in the clinical discussion 10 minutes.  Telehealth Modality: Phone visit (audio only)  I had a telephone visit with [name].  She is status post paraesophageal hernia repair.  Postoperatively she did fine.  She has been experiencing some reflux.  Esophagram showed recurrence of a small portion of the hiatal hernia.  She denies any dysphagia.  We discussed options of revision versus medical therapy.  She is elected to proceed with medical therapy at this point.  She will call us in the future if her symptoms worsen.

## 2021-09-07 ENCOUNTER — Ambulatory Visit
Admission: RE | Admit: 2021-09-07 | Discharge: 2021-09-07 | Disposition: A | Discharge status: Home / Self Care | Payer: PPO | Source: Ambulatory Visit

## 2021-09-07 DIAGNOSIS — Z1231 Encounter for screening mammogram for malignant neoplasm of breast: Secondary | ICD-10-CM | POA: Diagnosis not present

## 2021-09-22 ENCOUNTER — Other Ambulatory Visit: Payer: Self-pay

## 2021-09-22 ENCOUNTER — Encounter: Payer: Self-pay | Admitting: Family Medicine

## 2021-09-22 ENCOUNTER — Telehealth (INDEPENDENT_AMBULATORY_CARE_PROVIDER_SITE_OTHER): Payer: PPO | Admitting: Family Medicine

## 2021-09-22 VITALS — Temp 100.5°F | Ht 60.0 in | Wt 132.0 lb

## 2021-09-22 DIAGNOSIS — J988 Other specified respiratory disorders: Secondary | ICD-10-CM

## 2021-09-22 DIAGNOSIS — U071 COVID-19: Secondary | ICD-10-CM | POA: Diagnosis not present

## 2021-09-22 MED ORDER — NIRMATRELVIR/RITONAVIR (PAXLOVID)TABLET: 3.0000 | ORAL_TABLET | Freq: Two times a day (BID) | ORAL | 0 refills | Status: AC

## 2021-09-22 NOTE — Progress Notes (Signed)
Virtual Visit via Video Note  I connected with [name] on 09/22/21 at 10:00 AM EST by a video enabled telemedicine application and verified that I am speaking with the correct person using two identifiers.  Location patient: home, [place] Location provider:work or home office Persons participating in the virtual visit: patient, provider  I discussed the limitations of evaluation and management by telemedicine and the availability of in person appointments. The patient expressed understanding and agreed to proceed.   HPI: About a 2-day history of nasal congestion, runny nose, some postnasal drip.  Just mild dry cough. Some mild retro-orbital headache/pressure, mild body aches and fatigue.  T-max 100.5. Mild feeling of shortness of breath and chest tightness on occasion but she is not sure if this may be due to anxiety because she is scared of having COVID.  Some intermittent waves of nausea but no vomiting.  No abdominal pain or diarrhea. COVID test this morning was positive.  Exposure to multiple family members with COVID-positive illnesses recently.  ROS: See pertinent positives and negatives per HPI.  Past Medical History:  Diagnosis Date   Abdominal bruit 04/2017   Aortic u/s showed NO ANEURISM.   Allergy    SEASONAL   Anemia    low iron   Anxiety    Carotid artery occlusion    hx L carotid stent   Cataract    COPD (chronic obstructive pulmonary disease) (HCC)    DOE, worse since 11/2018.  Stress test neg 01/2019.  Albut x 1 inh prn helpful some.  Pulm 06/2019, PFTs not diag of COPD, DOE likely from deconditioning.   Essential hypertension, benign 05/22/2014   GERD (gastroesophageal reflux disease)    + hx of esophagitis   [name] syndrome    H/O hiatal hernia 02/2019   EGD-->moderate size->surgery recommended but pt declined.   Hematochezia 2020/21   internal hemorrhoids   History of iron deficiency anemia 07/2019   Dr. Bryan Lemma EGD and colonoscopy, some anorectal muscosal  ulceration, o/w just mild chronic gastritis.   Also could be poor absorption due to chronic high dose acid suppression. Hb/iron normalized aft iron infus and ongoing oral iron.   History of kidney stones    one time   Mitral valve prolapse    Mixed hyperlipidemia 05/22/2014   OAB (overactive bladder)    detrol LA from urol helpful   Osteopenia 06/06/2016   05/2016 DEXA T-score -1.5.  06/2018 DEXA T score -1.1.  T score -1.2 06/2021   Peripheral vascular disease, unspecified ([place]) 05/22/2014   innominate and carotid.  U/S f/u 12/2016 showed no signif change compared to 2017--vascular recommended repeat 1 yr.  Doing well as of 08/2018 cardiol f/u-->continued on ASA and Plavix. F/u 08/2019->ASA d/c'd, plavix continued.   Prediabetes 2018   2018 A1c 6.3%.  05/2018 A1c 6.1%. 07/2019 A1c 6.4%.   TIA (transient ischemic attack) 2006   Tobacco abuse, in remission    quit 2009    Past Surgical History:  Procedure Laterality Date   ABDOMINAL HYSTERECTOMY  1997   CARDIAC CATHETERIZATION  01/2012   NORMAL   CARDIOVASCULAR STRESS TEST  02/05/2019   NORMAL.  EF normal.   CAROTID ENDARTERECTOMY  08/21/2007   right - Dr. Amedeo Plenty   CHOLECYSTECTOMY  2000   COLONOSCOPY  approx 2008; 09/23/19   2008 (Dr. Lenon Ahmadi pt->normal.  08/2019 adenoma, some friable areas of colon + anorectal mucosa inflam w/ ulceration, +diverticulosis. Recall 5 yrs   DEXA  06/06/2016   05/2016 T-score -1.5.  06/2018 T score -1.1.  T score -1.2 06/2021.   ESOPHAGOGASTRODUODENOSCOPY  03/11/2019; 09/23/19   02/2019; 5 cm hiatal hernia; GI suggested surgical repair due to intractable GER.  08/2019 mild chronic gastritis, h pylori neg, multiple benign bx's including duodenum neg for celiac changes.   ESOPHAGOGASTRODUODENOSCOPY N/A 01/18/2021   Procedure: ESOPHAGOGASTRODUODENOSCOPY (EGD);  Surgeon: [name], MD;  Location: [facility];  Service: Thoracic;  Laterality: N/A;   Incision and drainage of left thenar space abscess   02/23/2015   s/p cat bite (Dr. Amedeo Plenty)   innominate stent  2007   12/2014 u/s showed patent stent in innominate and L common carotid.  L prox int carotid 40-59% stenosis   LDCT for lung ca screening  06/2019   NEG->rpt 1 yr   Left common carotid stent  2006 and 2007   "                    "                  "                          "                             "   TRANSTHORACIC ECHOCARDIOGRAM  2007   NORMAL   US CAROTID DOPPLER BILATERAL ([place] [facility])  06/2019   left stent w/out stenosis.  R ICA sten 40-50% obst-stable.  07/20/21 R clear, L 40-59%, normal vert and subclav flow.   XI ROBOTIC ASSISTED PARAESOPHAGEAL HERNIA REPAIR N/A 01/18/2021   Procedure: XI ROBOTIC ASSISTED PARAESOPHAGEAL HERNIA REPAIR WITH FUNDOPLICATION WITH MESH;  Surgeon: [name], MD;  Location: [facility];  Service: Thoracic;  Laterality: N/A;     Current Outpatient Medications:    atorvastatin (LIPITOR) 80 MG tablet, TAKE ONE TABLET BY MOUTH DAILY, Disp: 90 tablet, Rfl: 3   cholecalciferol (VITAMIN D) 1000 UNITS tablet, Take 1,000 Units by mouth daily., Disp: , Rfl:    clopidogrel (PLAVIX) 75 MG tablet, TAKE ONE TABLET BY MOUTH DAILY, Disp: 90 tablet, Rfl: 1   diazepam (VALIUM) 2 MG tablet, Take 1 tablet (2 mg total) by mouth 2 (two) times daily., Disp: 60 tablet, Rfl: 5   estradiol (ESTRACE) 0.1 MG/GM vaginal cream, Place 1 Applicatorful vaginally 2 (two) times a week., Disp: , Rfl:    ezetimibe (ZETIA) 10 MG tablet, TAKE ONE TABLET BY MOUTH EVERY DAY, Disp: 90 tablet, Rfl: 3   famotidine (PEPCID) 40 MG tablet, Take 1 tablet (40 mg total) by mouth daily., Disp: 90 tablet, Rfl: 3   fexofenadine (ALLEGRA) 180 MG tablet, Take 180 mg by mouth daily. Take 1 tablet daily for allergies, Disp: , Rfl:    fluticasone (FLONASE) 50 MCG/ACT nasal spray, Place 2 sprays into both nostrils daily., Disp: 16 g, Rfl: 4   lisinopril (ZESTRIL) 5 MG tablet, TAKE ONE TABLET BY MOUTH DAILY, Disp: 90 tablet, Rfl: 1   Magnesium 500  MG CAPS, Take 500 mg by mouth daily., Disp: , Rfl:    Omega-3 Fatty Acids (FISH OIL) 1000 MG CAPS, Take 2,000 mg by mouth daily. , Disp: , Rfl:    Probiotic Product (PROBIOTIC DAILY PO), Take 1 capsule by mouth daily., Disp: , Rfl:    tolterodine (DETROL LA) 4 MG 24 hr capsule, Take 4 mg by mouth daily., Disp: ,  Rfl:    albuterol (VENTOLIN HFA) 108 (90 Base) MCG/ACT inhaler, Inhale 2 puffs into the lungs every 6 (six) hours as needed for wheezing or shortness of breath. (Patient not taking: Reported on 08/03/2021), Disp: 8 g, Rfl: 6   ferrous sulfate 325 (65 FE) MG tablet, Take 325 mg by mouth daily.  (Patient not taking: Reported on 09/22/2021), Disp: , Rfl:    nitroGLYCERIN (NITROSTAT) 0.4 MG SL tablet, Place 1 tablet (0.4 mg total) under the tongue every 5 (five) minutes as needed for chest pain. (Patient not taking: Reported on 08/03/2021), Disp: 25 tablet, Rfl: 6  EXAM:  VITALS per patient if applicable:  Vitals with BMI 09/22/2021 08/03/2021 07/20/2021  Height 5\' 0"  5' 3.75" -  Weight 132 lbs 134 lbs 13 oz -  BMI 23.53 61.44 -  Systolic - 94 315  Diastolic - 61 72  Pulse - 67 -   Temp 100.5 today  GENERAL: alert, oriented, appears tired and mildly ill, is in no acute distress  HEENT: atraumatic, conjunttiva clear, no obvious abnormalities on inspection of external nose and ears  NECK: normal movements of the head and neck  LUNGS: on inspection no signs of respiratory distress, breathing rate appears normal, no obvious gross SOB, gasping or wheezing  CV: no obvious cyanosis  MS: moves all visible extremities without noticeable abnormality  PSYCH/NEURO: pleasant and cooperative, no obvious depression or anxiety, speech and thought processing grossly intact  LABS: none today    Chemistry      Component Value Date/Time   NA 140 08/03/2021 0910   NA 140 12/07/2020 1113   K 5.3 (H) 08/03/2021 0910   CL 103 08/03/2021 0910   CO2 31 08/03/2021 0910   BUN 15 08/03/2021 0910    BUN 12 12/07/2020 1113   CREATININE 0.77 08/03/2021 0910      Component Value Date/Time   CALCIUM 10.1 08/03/2021 0910   ALKPHOS 89 08/03/2021 0910   AST 28 08/03/2021 0910   ALT 28 08/03/2021 0910   BILITOT 1.8 (H) 08/03/2021 0910     GFR 78 on 08/03/21  Lab Results  Component Value Date   HGBA1C 6.1 08/03/2021   ASSESSMENT AND PLAN:  Discussed the following assessment and plan:  COVID-19 respiratory illness. She has been fully vaccinated. Her risk factors for complications from COVID infection: Age >65, PAD, COPD. GFR is 75 ml/min. She is within the treatment window for antiviral--I prescribed paxlovid today. Hold atorvastatin while on paxlovid. No sign of acute COPD exacerbation at this time. Continue Mucinex OTC, fluids, and rest.  I discussed the assessment and treatment plan with the patient. The patient was provided an opportunity to ask questions and all were answered. The patient agreed with the plan and demonstrated an understanding of the instructions.    Signed:  Crissie Sickles, MD           09/22/2021

## 2021-09-22 NOTE — Telephone Encounter (Signed)
Spoke with pt and offered appt to further discuss treatment plan. Appt scheduled

## 2021-10-04 ENCOUNTER — Encounter: Payer: Self-pay | Admitting: Family Medicine

## 2021-10-06 ENCOUNTER — Other Ambulatory Visit: Payer: Self-pay

## 2021-10-06 ENCOUNTER — Ambulatory Visit (INDEPENDENT_AMBULATORY_CARE_PROVIDER_SITE_OTHER): Payer: PPO

## 2021-10-06 DIAGNOSIS — Z0000 Encounter for general adult medical examination without abnormal findings: Secondary | ICD-10-CM | POA: Diagnosis not present

## 2021-10-06 NOTE — Patient Instructions (Signed)
[name] , Thank you for taking time to come for your Medicare Wellness Visit. I appreciate your ongoing commitment to your health goals. Please review the following plan we discussed and let me know if I can assist you in the future.   Screening recommendations/referrals: Colonoscopy: Done 09/23/19 repeat every  Mammogram: Done 09/07/21 repeat every year  Bone Density: Done 07/02/21 repeat every 2 years  Recommended yearly ophthalmology/optometry visit for glaucoma screening and checkup Recommended yearly dental visit for hygiene and checkup  Vaccinations: Influenza vaccine: Done 07/01/21 repeat every year  Pneumococcal vaccine: Up to date Tdap vaccine: Done 02/22/15 repeat every 10 years  Shingles vaccine: Completed 07/11/18 & 10/18/18   Covid-19:Completed 1/27, 2/24, 07/21/20 & 07/22/21  Advanced directives: Please bring a copy of your health care power of attorney and living will to the office at your convenience.  Conditions/risks identified: Get rid of rebound covid   Next appointment: Follow up in one year for your annual wellness visit    Preventive Care 65 Years and Older, Female Preventive care refers to lifestyle choices and visits with your health care provider that can promote health and wellness. What does preventive care include? A yearly physical exam. This is also called an annual well check. Dental exams once or twice a year. Routine eye exams. Ask your health care provider how often you should have your eyes checked. Personal lifestyle choices, including: Daily care of your teeth and gums. Regular physical activity. Eating a healthy diet. Avoiding tobacco and drug use. Limiting alcohol use. Practicing safe sex. Taking low-dose aspirin every day. Taking vitamin and mineral supplements as recommended by your health care provider. What happens during an annual well check? The services and screenings done by your health care provider during your annual well check  will depend on your age, overall health, lifestyle risk factors, and family history of disease. Counseling  Your health care provider may ask you questions about your: Alcohol use. Tobacco use. Drug use. Emotional well-being. Home and relationship well-being. Sexual activity. Eating habits. History of falls. Memory and ability to understand (cognition). Work and work Statistician. Reproductive health. Screening  You may have the following tests or measurements: Height, weight, and BMI. Blood pressure. Lipid and cholesterol levels. These may be checked every 5 years, or more frequently if you are over 42 years old. Skin check. Lung cancer screening. You may have this screening every year starting at age 19 if you have a 30-pack-year history of smoking and currently smoke or have quit within the past 15 years. Fecal occult blood test (FOBT) of the stool. You may have this test every year starting at age 53. Flexible sigmoidoscopy or colonoscopy. You may have a sigmoidoscopy every 5 years or a colonoscopy every 10 years starting at age 9. Hepatitis C blood test. Hepatitis B blood test. Sexually transmitted disease (STD) testing. Diabetes screening. This is done by checking your blood sugar (glucose) after you have not eaten for a while (fasting). You may have this done every 1-3 years. Bone density scan. This is done to screen for osteoporosis. You may have this done starting at age 68. Mammogram. This may be done every 1-2 years. Talk to your health care provider about how often you should have regular mammograms. Talk with your health care provider about your test results, treatment options, and if necessary, the need for more tests. Vaccines  Your health care provider may recommend certain vaccines, such as: Influenza vaccine. This is recommended every year. Tetanus,  diphtheria, and acellular pertussis (Tdap, Td) vaccine. You may need a Td booster every 10 years. Zoster vaccine. You  may need this after age 73. Pneumococcal 13-valent conjugate (PCV13) vaccine. One dose is recommended after age 106. Pneumococcal polysaccharide (PPSV23) vaccine. One dose is recommended after age 4. Talk to your health care provider about which screenings and vaccines you need and how often you need them. This information is not intended to replace advice given to you by your health care provider. Make sure you discuss any questions you have with your health care provider. Document Released: 10/09/2015 Document Revised: 06/01/2016 Document Reviewed: 07/14/2015 Elsevier Interactive Patient Education  2017 [place] Prevention in the Home Falls can cause injuries. They can happen to people of all ages. There are many things you can do to make your home safe and to help prevent falls. What can I do on the outside of my home? Regularly fix the edges of walkways and driveways and fix any cracks. Remove anything that might make you trip as you walk through a door, such as a raised step or threshold. Trim any bushes or trees on the path to your home. Use bright outdoor lighting. Clear any walking paths of anything that might make someone trip, such as rocks or tools. Regularly check to see if handrails are loose or broken. Make sure that both sides of any steps have handrails. Any raised decks and porches should have guardrails on the edges. Have any leaves, snow, or ice cleared regularly. Use sand or salt on walking paths during winter. Clean up any spills in your garage right away. This includes oil or grease spills. What can I do in the bathroom? Use night lights. Install grab bars by the toilet and in the tub and shower. Do not use towel bars as grab bars. Use non-skid mats or decals in the tub or shower. If you need to sit down in the shower, use a plastic, non-slip stool. Keep the floor dry. Clean up any water that spills on the floor as soon as it happens. Remove soap buildup  in the tub or shower regularly. Attach bath mats securely with double-sided non-slip rug tape. Do not have throw rugs and other things on the floor that can make you trip. What can I do in the bedroom? Use night lights. Make sure that you have a light by your bed that is easy to reach. Do not use any sheets or blankets that are too big for your bed. They should not hang down onto the floor. Have a firm chair that has side arms. You can use this for support while you get dressed. Do not have throw rugs and other things on the floor that can make you trip. What can I do in the kitchen? Clean up any spills right away. Avoid walking on wet floors. Keep items that you use a lot in easy-to-reach places. If you need to reach something above you, use a strong step stool that has a grab bar. Keep electrical cords out of the way. Do not use floor polish or wax that makes floors slippery. If you must use wax, use non-skid floor wax. Do not have throw rugs and other things on the floor that can make you trip. What can I do with my stairs? Do not leave any items on the stairs. Make sure that there are handrails on both sides of the stairs and use them. Fix handrails that are broken or loose. Make  sure that handrails are as long as the stairways. Check any carpeting to make sure that it is firmly attached to the stairs. Fix any carpet that is loose or worn. Avoid having throw rugs at the top or bottom of the stairs. If you do have throw rugs, attach them to the floor with carpet tape. Make sure that you have a light switch at the top of the stairs and the bottom of the stairs. If you do not have them, ask someone to add them for you. What else can I do to help prevent falls? Wear shoes that: Do not have high heels. Have rubber bottoms. Are comfortable and fit you well. Are closed at the toe. Do not wear sandals. If you use a stepladder: Make sure that it is fully opened. Do not climb a closed  stepladder. Make sure that both sides of the stepladder are locked into place. Ask someone to hold it for you, if possible. Clearly mark and make sure that you can see: Any grab bars or handrails. First and last steps. Where the edge of each step is. Use tools that help you move around (mobility aids) if they are needed. These include: Canes. Walkers. Scooters. Crutches. Turn on the lights when you go into a dark area. Replace any light bulbs as soon as they burn out. Set up your furniture so you have a clear path. Avoid moving your furniture around. If any of your floors are uneven, fix them. If there are any pets around you, be aware of where they are. Review your medicines with your doctor. Some medicines can make you feel dizzy. This can increase your chance of falling. Ask your doctor what other things that you can do to help prevent falls. This information is not intended to replace advice given to you by your health care provider. Make sure you discuss any questions you have with your health care provider. Document Released: 07/09/2009 Document Revised: 02/18/2016 Document Reviewed: 10/17/2014 Elsevier Interactive Patient Education  2017 [employer].

## 2021-10-06 NOTE — Progress Notes (Addendum)
Virtual Visit via Telephone Note  I connected with  [name] on 10/06/21 at  2:30 PM EST by telephone and verified that I am speaking with the correct person using two identifiers.  Location: Patient: Home Provider: Office Persons participating in the virtual visit: patient/Nurse Health Advisor   I discussed the limitations, risks, security and privacy concerns of performing an evaluation and management service by telephone and the availability of in person appointments. The patient expressed understanding and agreed to proceed.  Interactive audio and video telecommunications were attempted between this nurse and patient, however failed, due to patient having technical difficulties OR patient did not have access to video capability.  We continued and completed visit with audio only.  Some vital signs may be absent or patient reported.   Willette Brace, LPN   Subjective:   [name] is a 71 y.o. female who presents for Medicare Annual (Subsequent) preventive examination.  Review of Systems     Cardiac Risk Factors include: advanced age (>74men, >64 women);hypertension;dyslipidemia     Objective:    There were no vitals filed for this visit. There is no height or weight on file to calculate BMI.  Advanced Directives 10/06/2021 01/18/2021 01/14/2021 08/26/2020 05/15/2020 12/10/2018 01/09/2017  Does Patient Have a Medical Advance Directive? Yes No No Yes No No Yes  Type of Advance Directive Healthcare Power of [place]  Does patient want to make changes to medical advance directive? - - - - - - -  Copy of [place] in Chart? No - copy requested - - No - copy requested - - -  Would patient like information on creating a medical advance directive? - No - Patient declined Yes (MAU/Ambulatory/Procedural Areas - Information given) - - - -    Current Medications (verified) Outpatient Encounter  Medications as of 10/06/2021  Medication Sig   atorvastatin (LIPITOR) 80 MG tablet TAKE ONE TABLET BY MOUTH DAILY   cholecalciferol (VITAMIN D) 1000 UNITS tablet Take 1,000 Units by mouth daily.   clopidogrel (PLAVIX) 75 MG tablet TAKE ONE TABLET BY MOUTH DAILY   diazepam (VALIUM) 2 MG tablet Take 1 tablet (2 mg total) by mouth 2 (two) times daily.   estradiol (ESTRACE) 0.1 MG/GM vaginal cream Place 1 Applicatorful vaginally 2 (two) times a week.   ezetimibe (ZETIA) 10 MG tablet TAKE ONE TABLET BY MOUTH EVERY DAY   famotidine (PEPCID) 40 MG tablet Take 1 tablet (40 mg total) by mouth daily.   fexofenadine (ALLEGRA) 180 MG tablet Take 180 mg by mouth daily. Take 1 tablet daily for allergies   fluticasone (FLONASE) 50 MCG/ACT nasal spray Place 2 sprays into both nostrils daily.   lisinopril (ZESTRIL) 5 MG tablet TAKE ONE TABLET BY MOUTH DAILY   Magnesium 500 MG CAPS Take 500 mg by mouth daily.   Probiotic Product (PROBIOTIC DAILY PO) Take 1 capsule by mouth daily.   tolterodine (DETROL LA) 4 MG 24 hr capsule Take 4 mg by mouth daily.   albuterol (VENTOLIN HFA) 108 (90 Base) MCG/ACT inhaler Inhale 2 puffs into the lungs every 6 (six) hours as needed for wheezing or shortness of breath. (Patient not taking: Reported on 08/03/2021)   nitroGLYCERIN (NITROSTAT) 0.4 MG SL tablet Place 1 tablet (0.4 mg total) under the tongue every 5 (five) minutes as needed for chest pain. (Patient not taking: Reported on 08/03/2021)   Omega-3 Fatty Acids (FISH OIL)  1000 MG CAPS Take 2,000 mg by mouth daily.  (Patient not taking: Reported on 10/06/2021)   [DISCONTINUED] ferrous sulfate 325 (65 FE) MG tablet Take 325 mg by mouth daily.  (Patient not taking: Reported on 09/22/2021)   No facility-administered encounter medications on file as of 10/06/2021.    Allergies (verified) Doxycycline, Fentanyl, Fluocinolone, Meperidine, Penicillins, Breo ellipta [fluticasone furoate-vilanterol], and Imdur [isosorbide nitrate]    History: Past Medical History:  Diagnosis Date   Abdominal bruit 04/2017   Aortic u/s showed NO ANEURISM.   Allergy    SEASONAL   Anemia    low iron   Anxiety    Carotid artery occlusion    hx L carotid stent   Cataract    COPD (chronic obstructive pulmonary disease) (HCC)    DOE, worse since 11/2018.  Stress test neg 01/2019.  Albut x 1 inh prn helpful some.  Pulm 06/2019, PFTs not diag of COPD, DOE likely from deconditioning.   Essential hypertension, benign 05/22/2014   GERD (gastroesophageal reflux disease)    + hx of esophagitis   [name] syndrome    H/O hiatal hernia 02/2019   EGD-->moderate size->surgery recommended but pt declined.   Hematochezia 2020/21   internal hemorrhoids   History of iron deficiency anemia 07/2019   Dr. Bryan Lemma EGD and colonoscopy, some anorectal muscosal ulceration, o/w just mild chronic gastritis.   Also could be poor absorption due to chronic high dose acid suppression. Hb/iron normalized aft iron infus and ongoing oral iron.   History of kidney stones    one time   Mitral valve prolapse    Mixed hyperlipidemia 05/22/2014   OAB (overactive bladder)    detrol LA from urol helpful   Osteopenia 06/06/2016   05/2016 DEXA T-score -1.5.  06/2018 DEXA T score -1.1.  T score -1.2 06/2021   Peripheral vascular disease, unspecified ([place]) 05/22/2014   innominate and carotid.  U/S f/u 12/2016 showed no signif change compared to 2017--vascular recommended repeat 1 yr.  Doing well as of 08/2018 cardiol f/u-->continued on ASA and Plavix. F/u 08/2019->ASA d/c'd, plavix continued.   Prediabetes 2018   2018 A1c 6.3%.  05/2018 A1c 6.1%. 07/2019 A1c 6.4%.   TIA (transient ischemic attack) 2006   Tobacco abuse, in remission    quit 2009   Past Surgical History:  Procedure Laterality Date   ABDOMINAL HYSTERECTOMY  1997   CARDIAC CATHETERIZATION  01/2012   NORMAL   CARDIOVASCULAR STRESS TEST  02/05/2019   NORMAL.  EF normal.   CAROTID ENDARTERECTOMY   08/21/2007   right - Dr. Amedeo Plenty   CHOLECYSTECTOMY  2000   COLONOSCOPY  approx 2008; 09/23/19   2008 (Dr. Lenon Ahmadi pt->normal.  08/2019 adenoma, some friable areas of colon + anorectal mucosa inflam w/ ulceration, +diverticulosis. Recall 5 yrs   DEXA  06/06/2016   05/2016 T-score -1.5.  06/2018 T score -1.1.  T score -1.2 06/2021.   ESOPHAGOGASTRODUODENOSCOPY  03/11/2019; 09/23/19   02/2019; 5 cm hiatal hernia; GI suggested surgical repair due to intractable GER.  08/2019 mild chronic gastritis, h pylori neg, multiple benign bx's including duodenum neg for celiac changes.   ESOPHAGOGASTRODUODENOSCOPY N/A 01/18/2021   Procedure: ESOPHAGOGASTRODUODENOSCOPY (EGD);  Surgeon: [name], MD;  Location: [facility];  Service: Thoracic;  Laterality: N/A;   Incision and drainage of left thenar space abscess  02/23/2015   s/p cat bite (Dr. Amedeo Plenty)   innominate stent  2007   12/2014 u/s showed patent stent in innominate and L common  carotid.  L prox int carotid 40-59% stenosis   LDCT for lung ca screening  06/2019   NEG->rpt 1 yr   Left common carotid stent  2004/12/10 and Dec 10, 2005   "                    "                  "                          "                             "   TRANSTHORACIC ECHOCARDIOGRAM  Dec 10, 2005   NORMAL   US CAROTID DOPPLER BILATERAL ([place] [facility])  06/2019   left stent w/out stenosis.  R ICA sten 40-50% obst-stable.  07/20/21 R clear, L 40-59%, normal vert and subclav flow.   XI ROBOTIC ASSISTED PARAESOPHAGEAL HERNIA REPAIR N/A 01/18/2021   Procedure: XI ROBOTIC ASSISTED PARAESOPHAGEAL HERNIA REPAIR WITH FUNDOPLICATION WITH MESH;  Surgeon: [name], MD;  Location: [facility];  Service: Thoracic;  Laterality: N/A;   Family History  Problem Relation Age of Onset   Heart attack Mother    CAD Mother    Heart disease Mother        Before age 56   Hypertension Mother    Hyperlipidemia Mother    Hypertension Father    Pneumonia Father    Hyperlipidemia Father    Heart murmur  Sister    Osteoporosis Paternal Grandmother    CAD Paternal Grandfather    Breast cancer Neg Hx    Colon cancer Neg Hx    Esophageal cancer Neg Hx    Rectal cancer Neg Hx    Stomach cancer Neg Hx    Colon polyps Neg Hx    Social History   Socioeconomic History   Marital status: Widowed    Spouse name: Not on file   Number of children: Not on file   Years of education: Not on file   Highest education level: Not on file  Occupational History   Not on file  Tobacco Use   Smoking status: Former    Packs/day: 1.25    Years: 38.00    Pack years: 47.50    Types: Cigarettes    Quit date: 09/27/2007    Years since quitting: 14.0   Smokeless tobacco: Never  Vaping Use   Vaping Use: Never used  Substance and Sexual Activity   Alcohol use: No   Drug use: No   Sexual activity: Not on file  Other Topics Concern   Not on file  Social History Narrative   Widowed as of 12-11-14 (husband died of viral illness while out of town in [place], [place]), has one daughter.   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   Tobacco: 20 pack-yr hx, quit 2007-12-11.   Alcohol: rare.   No hx of alc or drug problems.   Exercise: walks 2.31miles on treadmill daily.   Social Determinants of Health   Financial Resource Strain: Low Risk    Difficulty of Paying Living Expenses: Not hard at all  Food Insecurity: No Food Insecurity   Worried About Charity fundraiser in the Last Year: Never true   Ran Out of Food in the Last Year: Never true  Transportation Needs: No Data processing manager (Medical): No   Lack of Transportation (Non-Medical): No  Physical Activity: Sufficiently Active   Days of Exercise per Week: 7 days   Minutes of Exercise per Session: 50 min  Stress: No Stress Concern Present   Feeling of Stress : Not at all  Social Connections: Socially Isolated   Frequency of Communication with Friends and Family: Three times a week    Frequency of Social Gatherings with Friends and Family: More than three times a week   Attends Religious Services: Never   Marine scientist or Organizations: No   Attends Archivist Meetings: Never   Marital Status: Widowed    Tobacco Counseling Counseling given: Not Answered   Clinical Intake:  Pre-visit preparation completed: Yes  Pain : No/denies pain     BMI - recorded: 25.78 Nutritional Status: BMI 25 -29 Overweight Nutritional Risks: None Diabetes: No  How often do you need to have someone help you when you read instructions, pamphlets, or other written materials from your doctor or pharmacy?: 1 - Never  Diabetic?No  Interpreter Needed?: No  Information entered by :: Charlott Rakes, LPN   Activities of Daily Living In your present state of health, do you have any difficulty performing the following activities: 10/06/2021 01/18/2021  Hearing? N N  Vision? N N  Comment - -  Difficulty concentrating or making decisions? N N  Walking or climbing stairs? N N  Dressing or bathing? N N  Doing errands, shopping? N N  Preparing Food and eating ? N -  Using the Toilet? N -  In the past six months, have you accidently leaked urine? N -  Do you have problems with loss of bowel control? N -  Managing your Medications? N -  Managing your Finances? N -  Housekeeping or managing your Housekeeping? N -  Some recent data might be hidden    Patient Care Team: Tammi Sou, MD as PCP - General (Family Medicine) Jettie Booze, MD as PCP - Cardiology (Cardiology) Jettie Booze, MD as Attending Physician (Cardiology) Serafina Mitchell, MD as Consulting Physician (Vascular Surgery) Roseanne Kaufman, MD as Consulting Physician (Orthopedic Surgery) Lavonna Monarch, MD as Consulting Physician (Dermatology) Armbruster, Carlota Raspberry, MD as Consulting Physician (Gastroenterology) Clovis Riley, MD as Consulting Physician (General Surgery) Candee Furbish, MD as Consulting Physician (Pulmonary Disease) Lavena Bullion, DO as Consulting Physician (Gastroenterology) Janith Lima, MD as Consulting Physician (Urology)  Indicate any recent Medical Services you may have received from other than [facility] providers in the past year (date may be approximate).     Assessment:   This is a routine wellness examination for [name].  Hearing/Vision screen Hearing Screening - Comments:: Pt denies any hearing issues  Vision Screening - Comments:: Pt follows up with Dr Katy Fitch for annual eye exams   Dietary issues and exercise activities discussed: Current Exercise Habits: Home exercise routine, Type of exercise: walking, Time (Minutes): 50, Frequency (Times/Week): 7, Weekly Exercise (Minutes/Week): 350   Goals Addressed             This Visit's Progress    Patient Stated       Get rid of rebound covid       Depression Screen PHQ 2/9 Scores 10/06/2021 09/22/2021 08/26/2020 08/01/2019 05/31/2018 11/28/2017 05/24/2017  PHQ - 2 Score 0 0 0 0 0 0 1  PHQ- 9 Score - - - - 0 0 -  Fall Risk Fall Risk  10/06/2021 09/22/2021 08/26/2020 04/21/2020 08/19/2019  Falls in the past year? 0 0 0 0 0  Comment - - - [facility] Telephone Survey: data to providers prior to load [employer] Telephone Survey: data to providers prior to load  Number falls in past yr: 0 0 0 - -  Injury with Fall? 0 0 0 - -  Risk for fall due to : Impaired vision - - - -  Follow up Falls prevention discussed Falls evaluation completed Falls prevention discussed - -    FALL RISK PREVENTION PERTAINING TO THE HOME:  Any stairs in or around the home? Yes  If so, are there any without handrails? Yes  Home free of loose throw rugs in walkways, pet beds, electrical cords, etc? Yes  Adequate lighting in your home to reduce risk of falls? Yes   ASSISTIVE DEVICES UTILIZED TO PREVENT FALLS:   Life alert? No  Use of a cane, walker or w/c? No  Grab bars in the bathroom? Yes  Shower chair or bench  in shower? No  Elevated toilet seat or a handicapped toilet? No   TIMED UP AND GO:  Was the test performed? No .   Cognitive Function:     6CIT Screen 10/06/2021  What Year? 0 points  What month? 0 points  What time? 0 points  Count back from 20 0 points  Months in reverse 0 points  Repeat phrase 0 points  Total Score 0    Immunizations Immunization History  Administered Date(s) Administered   Fluad Quad(high Dose 65+) 06/11/2019   Influenza, High Dose Seasonal PF 05/23/2018, 07/21/2020   Influenza-Unspecified 07/01/2021   Moderna Covid-19 Vaccine Bivalent Booster 48yrs & up 07/22/2021   Moderna Sars-Covid-2 Vaccination 10/23/2019, 11/20/2019, 07/21/2020   Pneumococcal Conjugate-13 05/23/2016   Pneumococcal Polysaccharide-23 05/24/2017   Tdap 02/22/2015   Zoster Recombinat (Shingrix) 07/11/2018, 10/18/2018    TDAP status: Up to date  Flu Vaccine status: Up to date  Pneumococcal vaccine status: Up to date  Covid-19 vaccine status: Completed vaccines  Qualifies for Shingles Vaccine? Yes   Zostavax completed Yes   Shingrix Completed?: Yes  Screening Tests Health Maintenance  Topic Date Due   MAMMOGRAM  09/07/2022   COLONOSCOPY (Pts 45-19yrs Insurance coverage will need to be confirmed)  09/22/2024   TETANUS/TDAP  02/21/2025   Pneumonia Vaccine 73+ Years old  Completed   INFLUENZA VACCINE  Completed   DEXA SCAN  Completed   COVID-19 Vaccine  Completed   Hepatitis C Screening  Completed   Zoster Vaccines- Shingrix  Completed   HPV VACCINES  Aged Out   Fecal DNA (Cologuard)  Discontinued    Health Maintenance  There are no preventive care reminders to display for this patient.  Colorectal cancer screening: Type of screening: Colonoscopy. Completed 09/23/19. Repeat every 5 years  Mammogram status: Completed 09/07/21. Repeat every year  Bone Density status: Completed 07/02/21. Results reflect: Bone density results: OSTEOPENIA. Repeat every 2  years.   Additional Screening:  Hepatitis C Screening:  Completed 01/30/20  Vision Screening: Recommended annual ophthalmology exams for early detection of glaucoma and other disorders of the eye. Is the patient up to date with their annual eye exam?  Yes  Who is the provider or what is the name of the office in which the patient attends annual eye exams? Dr Katy Fitch  If pt is not established with a provider, would they like to be referred to a provider to establish care? No .  Dental Screening: Recommended annual dental exams for proper oral hygiene  Community Resource Referral / Chronic Care Management: CRR required this visit?  No   CCM required this visit?  No      Plan:     I have personally reviewed and noted the following in the patients chart:   Medical and social history Use of alcohol, tobacco or illicit drugs  Current medications and supplements including opioid prescriptions.  Functional ability and status Nutritional status Physical activity Advanced directives List of other physicians Hospitalizations, surgeries, and ER visits in previous 12 months Vitals Screenings to include cognitive, depression, and falls Referrals and appointments  In addition, I have reviewed and discussed with patient certain preventive protocols, quality metrics, and best practice recommendations. A written personalized care plan for preventive services as well as general preventive health recommendations were provided to patient.     Willette Brace, LPN   [id number]/[id number]   Nurse Notes: Spoke with pt and she stated she had a positive covid test done on last night. Pt asked if she would be able to work, I directed her to notify employer they may not want her to come in. Wanted to inform you of her results please advise

## 2021-11-01 ENCOUNTER — Encounter (HOSPITAL_BASED_OUTPATIENT_CLINIC_OR_DEPARTMENT_OTHER): Payer: Self-pay

## 2021-11-01 ENCOUNTER — Emergency Department (HOSPITAL_BASED_OUTPATIENT_CLINIC_OR_DEPARTMENT_OTHER)
Admission: EM | Admit: 2021-11-01 | Discharge: 2021-11-02 | Disposition: A | Discharge status: Home / Self Care | Payer: PPO | Attending: Emergency Medicine | Admitting: Emergency Medicine

## 2021-11-01 ENCOUNTER — Other Ambulatory Visit: Payer: Self-pay

## 2021-11-01 DIAGNOSIS — R109 Unspecified abdominal pain: Secondary | ICD-10-CM | POA: Diagnosis not present

## 2021-11-01 DIAGNOSIS — R1012 Left upper quadrant pain: Secondary | ICD-10-CM | POA: Diagnosis not present

## 2021-11-01 DIAGNOSIS — R509 Fever, unspecified: Secondary | ICD-10-CM | POA: Diagnosis not present

## 2021-11-01 DIAGNOSIS — I700 Atherosclerosis of aorta: Secondary | ICD-10-CM | POA: Diagnosis not present

## 2021-11-01 DIAGNOSIS — R1032 Left lower quadrant pain: Secondary | ICD-10-CM | POA: Diagnosis not present

## 2021-11-01 DIAGNOSIS — Z79899 Other long term (current) drug therapy: Secondary | ICD-10-CM | POA: Insufficient documentation

## 2021-11-01 DIAGNOSIS — Z7902 Long term (current) use of antithrombotics/antiplatelets: Secondary | ICD-10-CM | POA: Insufficient documentation

## 2021-11-01 LAB — LIPASE, BLOOD: Lipase: 36 U/L (ref 11–51)

## 2021-11-01 LAB — URINALYSIS, ROUTINE W REFLEX MICROSCOPIC
Bilirubin Urine: NEGATIVE
Glucose, UA: NEGATIVE mg/dL
Hgb urine dipstick: NEGATIVE
Ketones, ur: NEGATIVE mg/dL
Leukocytes,Ua: NEGATIVE
Nitrite: NEGATIVE
Protein, ur: NEGATIVE mg/dL
Specific Gravity, Urine: 1.015 (ref 1.005–1.030)
pH: 7.5 (ref 5.0–8.0)

## 2021-11-01 LAB — COMPREHENSIVE METABOLIC PANEL
ALT: 24 U/L (ref 0–44)
AST: 27 U/L (ref 15–41)
Albumin: 3.8 g/dL (ref 3.5–5.0)
Alkaline Phosphatase: 82 U/L (ref 38–126)
Anion gap: 9 (ref 5–15)
BUN: 14 mg/dL (ref 8–23)
CO2: 20 mmol/L — ABNORMAL LOW (ref 22–32)
Calcium: 8.6 mg/dL — ABNORMAL LOW (ref 8.9–10.3)
Chloride: 108 mmol/L (ref 98–111)
Creatinine, Ser: 0.69 mg/dL (ref 0.44–1.00)
GFR, Estimated: >60 mL/min (ref >60)
Glucose, Bld: 146 mg/dL — ABNORMAL HIGH (ref 70–99)
Potassium: 3.7 mmol/L (ref 3.5–5.1)
Sodium: 137 mmol/L (ref 135–145)
Total Bilirubin: 1.2 mg/dL (ref 0.3–1.2)
Total Protein: 6.6 g/dL (ref 6.5–8.1)

## 2021-11-01 LAB — CBC
HCT: 42.4 % (ref 36.0–46.0)
Hemoglobin: 14.5 g/dL (ref 12.0–15.0)
MCH: 31.2 pg (ref 26.0–34.0)
MCHC: 34.2 g/dL (ref 30.0–36.0)
MCV: 91.2 fL (ref 80.0–100.0)
Platelets: 222 10*3/uL (ref 150–400)
RBC: 4.65 MIL/uL (ref 3.87–5.11)
RDW: 12.9 % (ref 11.5–15.5)
WBC: 6.1 10*3/uL (ref 4.0–10.5)
nRBC: 0 % (ref 0.0–0.2)

## 2021-11-01 NOTE — ED Triage Notes (Addendum)
Pt reports mid lower abd pain, back pain and fever onset around noon today associated with nausea, denies v/d. Tmax 103.3, last took tylenol at 2030.

## 2021-11-02 ENCOUNTER — Emergency Department (HOSPITAL_BASED_OUTPATIENT_CLINIC_OR_DEPARTMENT_OTHER): Payer: PPO

## 2021-11-02 DIAGNOSIS — R109 Unspecified abdominal pain: Secondary | ICD-10-CM | POA: Diagnosis not present

## 2021-11-02 DIAGNOSIS — I700 Atherosclerosis of aorta: Secondary | ICD-10-CM | POA: Diagnosis not present

## 2021-11-02 MED ORDER — IOHEXOL 300 MG/ML  SOLN
100.0000 mL | Freq: Once | INTRAMUSCULAR | Status: AC | PRN
  Administered 2021-11-02: 100 mL via INTRAVENOUS

## 2021-11-02 NOTE — ED Provider Notes (Signed)
[facility] EMERGENCY DEPARTMENT Provider Note   CSN: [id number] Arrival date & time: 11/01/21  2253     History  Chief Complaint  Patient presents with   Abdominal Pain    [name] is a 71 y.o. female.  Recently stopped taking her detrol and having some suprapubic pain radiating to left flank since that time.  The history is provided by the patient.  Abdominal Pain Pain location:  LUQ, LLQ and suprapubic Pain quality: aching and sharp  Burning: 2. Pain radiates to:  Does not radiate Pain severity:  Mild Onset quality:  Gradual Duration:  2 days Timing:  Constant Progression:  Worsening Chronicity:  New Context: not alcohol use   Relieved by:  None tried Worsened by:  Nothing     Home Medications Prior to Admission medications   Medication Sig Start Date End Date Taking? Authorizing Provider  albuterol (VENTOLIN HFA) 108 (90 Base) MCG/ACT inhaler Inhale 2 puffs into the lungs every 6 (six) hours as needed for wheezing or shortness of breath. Patient not taking: Reported on 08/03/2021 11/09/20   June Leap L, DO  atorvastatin (LIPITOR) 80 MG tablet TAKE ONE TABLET BY MOUTH DAILY 08/05/21   McGowen, Adrian Blackwater, MD  cholecalciferol (VITAMIN D) 1000 UNITS tablet Take 1,000 Units by mouth daily.    [provider]  clopidogrel (PLAVIX) 75 MG tablet TAKE ONE TABLET BY MOUTH DAILY 08/10/21   McGowen, Adrian Blackwater, MD  diazepam (VALIUM) 2 MG tablet Take 1 tablet (2 mg total) by mouth 2 (two) times daily. 08/05/21   McGowen, Adrian Blackwater, MD  estradiol (ESTRACE) 0.1 MG/GM vaginal cream Place 1 Applicatorful vaginally 2 (two) times a week. 05/27/20   [provider]  ezetimibe (ZETIA) 10 MG tablet TAKE ONE TABLET BY MOUTH EVERY DAY 05/11/21   McGowen, Adrian Blackwater, MD  famotidine (PEPCID) 40 MG tablet Take 1 tablet (40 mg total) by mouth daily. 08/03/21   McGowen, Adrian Blackwater, MD  fexofenadine (ALLEGRA) 180 MG tablet Take 180 mg by mouth daily. Take 1 tablet daily  for allergies    [provider]  fluticasone (FLONASE) 50 MCG/ACT nasal spray Place 2 sprays into both nostrils daily. 04/14/21   McGowen, Adrian Blackwater, MD  lisinopril (ZESTRIL) 5 MG tablet TAKE ONE TABLET BY MOUTH DAILY 08/10/21   McGowen, Adrian Blackwater, MD  Magnesium 500 MG CAPS Take 500 mg by mouth daily.    [provider]  nitroGLYCERIN (NITROSTAT) 0.4 MG SL tablet Place 1 tablet (0.4 mg total) under the tongue every 5 (five) minutes as needed for chest pain. Patient not taking: Reported on 08/03/2021 05/12/20   Jettie Booze, MD  Omega-3 Fatty Acids (FISH OIL) 1000 MG CAPS Take 2,000 mg by mouth daily.  Patient not taking: Reported on 10/06/2021    [provider]  Probiotic Product (PROBIOTIC DAILY PO) Take 1 capsule by mouth daily.    [provider]  tolterodine (DETROL LA) 4 MG 24 hr capsule Take 4 mg by mouth daily. 05/27/20   [provider]      Allergies    Doxycycline, Fentanyl, Fluocinolone, Meperidine, Penicillins, Breo ellipta [fluticasone furoate-vilanterol], and Imdur [isosorbide nitrate]    Review of Systems   Review of Systems  Gastrointestinal:  Positive for abdominal pain.   Physical Exam Updated Vital Signs BP 121/69 (BP Location: Right Arm)    Pulse (!) 107    Temp 100.1 F (37.8 C) (Oral)    Resp 18  Ht 5\' 4"  (1.626 m)    Wt 61.2 kg    SpO2 95%    BMI 23.17 kg/m  Physical Exam Vitals and nursing note reviewed.  Constitutional:      Appearance: She is well-developed.  HENT:     Head: Normocephalic and atraumatic.     Mouth/Throat:     Mouth: Mucous membranes are moist.     Pharynx: Oropharynx is clear.  Eyes:     Pupils: Pupils are equal, round, and reactive to light.  Cardiovascular:     Rate and Rhythm: Normal rate and regular rhythm.  Pulmonary:     Effort: Pulmonary effort is normal. No respiratory distress.     Breath sounds: No stridor.  Abdominal:     General: Abdomen is flat. There is no distension.   Musculoskeletal:        General: No swelling or tenderness. Normal range of motion.     Cervical back: Normal range of motion.  Skin:    General: Skin is warm and dry.  Neurological:     General: No focal deficit present.     Mental Status: She is alert.    ED Results / Procedures / Treatments   Labs (all labs ordered are listed, but only abnormal results are displayed) Labs Reviewed  COMPREHENSIVE METABOLIC PANEL - Abnormal; Notable for the following components:      Result Value   CO2 20 (*)    Glucose, Bld 146 (*)    Calcium 8.6 (*)    All other components within normal limits  URINE CULTURE  LIPASE, BLOOD  CBC  URINALYSIS, ROUTINE W REFLEX MICROSCOPIC    EKG None  Radiology CT ABDOMEN PELVIS W CONTRAST  Result Date: 11/02/2021 CLINICAL DATA:  Left lower quadrant pain EXAM: CT ABDOMEN AND PELVIS WITH CONTRAST TECHNIQUE: Multidetector CT imaging of the abdomen and pelvis was performed using the standard protocol following bolus administration of intravenous contrast. RADIATION DOSE REDUCTION: This exam was performed according to the departmental dose-optimization program which includes automated exposure control, adjustment of the mA and/or kV according to patient size and/or use of iterative reconstruction technique. CONTRAST:  134mL OMNIPAQUE IOHEXOL 300 MG/ML  SOLN COMPARISON:  02/08/2006 FINDINGS: Lower chest: Moderate-sized hiatal hernia. No acute abnormality. Coronary artery calcifications. Hepatobiliary: No focal liver abnormality is seen. Status post cholecystectomy. No biliary dilatation. Pancreas: No focal abnormality or ductal dilatation. Spleen: No focal abnormality.  Normal size. Adrenals/Urinary Tract: Small bilateral renal cysts. No hydronephrosis. Adrenal glands and urinary bladder unremarkable. Stomach/Bowel: Normal appendix. Stomach, large and small bowel grossly unremarkable. Vascular/Lymphatic: No evidence of aneurysm or adenopathy. Aortic atherosclerosis  Reproductive: Prior hysterectomy.  No adnexal masses. Other: No free fluid or free air. Musculoskeletal: No acute bony abnormality. IMPRESSION: No acute findings in the abdomen or pelvis. Coronary artery disease, aortic atherosclerosis. Moderate-sized hiatal hernia. Electronically Signed   By: Rolm Baptise M.D.   On: 11/02/2021 01:34    Procedures Procedures    Medications Ordered in ED Medications  iohexol (OMNIPAQUE) 300 MG/ML solution 100 mL (100 mLs Intravenous Contrast Given 11/02/21 0119)    ED Course/ Medical Decision Making/ A&P                           Medical Decision Making Amount and/or Complexity of Data Reviewed Labs: ordered. Radiology: ordered.  Risk Prescription drug management.   Maybe from stopping the medicine? No e/o UTI, kidney stone or diverticulitis. No other associated  symptoms to suggest alternative causes. Will d/c with PCP follow up.    Final Clinical Impression(s) / ED Diagnoses Final diagnoses:  Abdominal pain, unspecified abdominal location  Fever, unspecified fever cause    Rx / DC Orders ED Discharge Orders     None         Tayana Shankle, Corene Cornea, MD 11/02/21 [phone]

## 2021-11-02 NOTE — ED Notes (Signed)
Bladder scan preformed.  Resulted 18 mL of urine in bladder

## 2021-11-03 LAB — URINE CULTURE: Culture: NO GROWTH

## 2021-11-12 ENCOUNTER — Other Ambulatory Visit: Payer: Self-pay | Admitting: Family Medicine

## 2021-12-11 ENCOUNTER — Other Ambulatory Visit: Payer: Self-pay | Admitting: Family Medicine

## 2021-12-23 DIAGNOSIS — R102 Pelvic and perineal pain: Secondary | ICD-10-CM | POA: Diagnosis not present

## 2021-12-23 DIAGNOSIS — N3281 Overactive bladder: Secondary | ICD-10-CM | POA: Diagnosis not present

## 2022-01-12 ENCOUNTER — Ambulatory Visit (INDEPENDENT_AMBULATORY_CARE_PROVIDER_SITE_OTHER): Payer: PPO | Admitting: Gastroenterology

## 2022-01-12 ENCOUNTER — Encounter: Payer: Self-pay | Admitting: Gastroenterology

## 2022-01-12 VITALS — BP 112/74 | HR 88 | Ht 64.0 in | Wt 133.0 lb

## 2022-01-12 DIAGNOSIS — K449 Diaphragmatic hernia without obstruction or gangrene: Secondary | ICD-10-CM | POA: Diagnosis not present

## 2022-01-12 DIAGNOSIS — R110 Nausea: Secondary | ICD-10-CM

## 2022-01-12 DIAGNOSIS — R1013 Epigastric pain: Secondary | ICD-10-CM | POA: Diagnosis not present

## 2022-01-12 DIAGNOSIS — R12 Heartburn: Secondary | ICD-10-CM | POA: Diagnosis not present

## 2022-01-12 DIAGNOSIS — Z9889 Other specified postprocedural states: Secondary | ICD-10-CM

## 2022-01-12 NOTE — Progress Notes (Signed)
? ?Chief Complaint:    Nausea, GERD ? ?GI History: [name] is a 71 y.o. female with a history of CVA (on Plavix), Right CEA (2007), left carotid stent, right subclavian stent, osteopenia, and longstanding history of GERD complicated by erosive esophagitis, initially referred to me by Dr. Havery Moros for evaluation of possible antireflux intervention with Transoral Incisionless Fundoplication (TIF) is a goal to stop or significantly reduce acid suppression therapy. ?  ?Has had reflux for multiple years, with occasional pyrosis but more significantly endorses regurgitation.  Positive nocturnal symptoms of regurgitation/choking which is increasingly bothersome.  Currently taking Protonix 40 mg twice daily and Pepcid 40 mg daily along with avoiding eating within 3 hours of bedtime (eats at 5:30), HOB elevation.  Prescribed Carafate  prn as well.  Worse with forward flexion, particularly with regurgitation.  Does endorse intermittent solid food dysphagia,pointing to anterior neck, occurring <once/month, typically cleared with fluids/repeat swallowing.  Does endorse dyspepsia.  ?  ?GERD history: ?-Index symptoms: Regurgitation, pyrosis, nocturnal choking/regurgitation, dyspepsia ?-Medications trialed: Nexium, omeprazole, Carafate prn ?-Current medications: Protonix 40 mg daily, Pepcid 40 mg qd ?-Complications: Dysphagia, 5 cm hiatal hernia ?  ?GERD evaluation: ?- CXR (11/2018): Small HH ?- EGD (02/2019, Dr. Havery Moros): 5 cm HH, Cameron ulceration, fundic gland polyps ?-Barium esophagram (03/2019): Normal esophagus, moderate sized HH, extensive reflux noted on water siphon test ?- 01/18/2021: Robotic repair of paraesophageal hernia and Dor fundoplication with mesh by Dr. Kipp Brood  ?- Esophagram (08/30/2021): Small residual/recurrent hiatal hernia, esophageal stasis with increased tertiary contractions without reflux, stricture, or ulceration ?- 09/03/2021: Evaluation at Cardiothoracic Surgery for consideration of  revision.  Patient elected for medical therapy. ?  ?  ?Iron deficiency anemia diagnosed in 07/2019: ?-Iron 20, transferrin 43, sat 3.5%, ferritin 5.4 (none for comparison) ?-H/H 11.6/36.5 with MCV/RDW 76.3/17.7 (12.9/39.0 with 86.5/15 in 01/2019 and baseline hemoglobin ~14) ?-Started on ferrous sulfate bid 07/2019 (some constipation) ?  ?Endoscopic history: ?-Colonoscopy approximate 2008 normal per patient ?- EGD (02/2019, Dr. Havery Moros): 5 cm HH, Cameron ulceration, fundic gland polyps ?- Cologuard negative 05/2017 ?-EGD (08/2019, Dr. Bryan Lemma): 5 cm HH, no Cameron's ulcer, Hill grade 4, fundic gland polyps, 8 mm antral HP resected, mild non-H. pylori gastritis, normal duodenum with normal biopsies ?-Colonoscopy (08/2019, Dr. Bryan Lemma): 6 polyps (SSP x2, HP x4), internal hemorrhoids, sigmoid diverticulosis.  Normal TI.  Repeat 5 years ? ?HPI:   ? ? ?Patient is a 71 y.o. female presenting to the Gastroenterology Clinic for evaluation of nausea and GERD. Last seen by me on 10/24/2019.  Patient underwent robotic assisted paraesophageal hernia repair and Dor fundoplication in 02/8114.  As above, started having some breakthrough reflux symptoms with esophagram in 12/22 demonstrating small residual/recurrent hernia.  Patient declined surgical revision. ? ?Today, she states she has nausea without emesis, typically every 1-2 days. Improves with peppermint candy. Also with upper abdominal pain a few times/week, independent of nausea. No related to PO intake. Seemingly occurs at random.  ? ?Occasional HB and regurgitation, but mostly controlled with Protonix 40 mg QAM and Pepcid 40 mg QPM.  ? ? ?-11/02/2021: CT abdomen/pelvis (ER evaluation for LLQ pain): Moderate sized hiatal hernia, ccy, otherwise normal liver, pancreas, spleen, GI tract. ? ? ?  Latest Ref Rng & Units 11/01/2021  ? 11:23 PM 08/03/2021  ?  9:10 AM 01/19/2021  ? 12:53 AM  ?CBC  ?WBC 4.0 - 10.5 K/uL 6.1   6.4   10.9    ?Hemoglobin 12.0 - 15.0 g/dL 14.5  14.8    13.4    ?Hematocrit 36.0 - 46.0 % 42.4   44.3   40.2    ?Platelets 150 - 400 K/uL 222   274.0   215    ? ? ? ?  Latest Ref Rng & Units 11/01/2021  ? 11:23 PM 08/03/2021  ?  9:10 AM 04/01/2021  ?  8:51 AM  ?CMP  ?Glucose 70 - 99 mg/dL 146   86     ?BUN 8 - 23 mg/dL 14   15     ?Creatinine 0.44 - 1.00 mg/dL 0.69   0.77     ?Sodium 135 - 145 mmol/L 137   140     ?Potassium 3.5 - 5.1 mmol/L 3.7   5.3     ?Chloride 98 - 111 mmol/L 108   103     ?CO2 22 - 32 mmol/L 20   31     ?Calcium 8.9 - 10.3 mg/dL 8.6   10.1     ?Total Protein 6.5 - 8.1 g/dL 6.6   6.7     ?Total Bilirubin 0.3 - 1.2 mg/dL 1.2   1.8     ?Alkaline Phos 38 - 126 U/L 82   89     ?AST 15 - 41 U/L '27   28   30    '$ ?ALT 0 - 44 U/L 24   28   33    ? ? ? ?Review of systems:     No chest pain, no SOB, no fevers, no urinary sx  ? ?Past Medical History:  ?Diagnosis Date  ? Abdominal bruit 04/2017  ? Aortic u/s showed NO ANEURISM.  ? Allergy   ? SEASONAL  ? Anemia   ? low iron  ? Anxiety   ? Carotid artery occlusion   ? hx L carotid stent  ? Cataract   ? COPD (chronic obstructive pulmonary disease) ([place])   ? DOE, worse since 11/2018.  Stress test neg 01/2019.  Albut x 1 inh prn helpful some.  Pulm 06/2019, PFTs not diag of COPD, DOE likely from deconditioning.  ? Essential hypertension, benign 05/22/2014  ? GERD (gastroesophageal reflux disease)   ? + hx of esophagitis  ? [name] syndrome   ? H/O hiatal hernia 02/2019  ? EGD-->moderate size->surgery recommended but pt declined.  ? Hematochezia 2020/21  ? internal hemorrhoids  ? History of iron deficiency anemia 07/2019  ? Dr. Bryan Lemma EGD and colonoscopy, some anorectal muscosal ulceration, o/w just mild chronic gastritis.   Also could be poor absorption due to chronic high dose acid suppression. Hb/iron normalized aft iron infus and ongoing oral iron.  ? History of kidney stones   ? one time  ? Mitral valve prolapse   ? Mixed hyperlipidemia 05/22/2014  ? OAB (overactive bladder)   ? detrol LA from urol helpful  ?  Osteopenia 06/06/2016  ? 05/2016 DEXA T-score -1.5.  06/2018 DEXA T score -1.1.  T score -1.2 06/2021  ? Peripheral vascular disease, unspecified ([place]) 05/22/2014  ? innominate and carotid.  U/S f/u 12/2016 showed no signif change compared to 2017--vascular recommended repeat 1 yr.  Doing well as of 08/2018 cardiol f/u-->continued on ASA and Plavix. F/u 08/2019->ASA d/c'd, plavix continued.  ? Prediabetes 2018  ? 2018 A1c 6.3%.  05/2018 A1c 6.1%. 07/2019 A1c 6.4%.  ? TIA (transient ischemic attack) 2006  ? Tobacco abuse, in remission   ? quit 2009  ? ? ?Patient's surgical history, family medical history, social history, medications  and allergies were all reviewed in Epic  ? ? ?Current Outpatient Medications  ?Medication Sig Dispense Refill  ? albuterol (VENTOLIN HFA) 108 (90 Base) MCG/ACT inhaler Inhale 2 puffs into the lungs every 6 (six) hours as needed for wheezing or shortness of breath. 8 g 6  ? atorvastatin (LIPITOR) 80 MG tablet TAKE ONE TABLET BY MOUTH DAILY 90 tablet 3  ? cholecalciferol (VITAMIN D) 1000 UNITS tablet Take 1,000 Units by mouth daily.    ? clopidogrel (PLAVIX) 75 MG tablet TAKE ONE TABLET BY MOUTH DAILY 90 tablet 1  ? diazepam (VALIUM) 2 MG tablet Take 1 tablet (2 mg total) by mouth 2 (two) times daily. 60 tablet 5  ? estradiol (ESTRACE) 0.1 MG/GM vaginal cream Place 1 Applicatorful vaginally 2 (two) times a week.    ? ezetimibe (ZETIA) 10 MG tablet TAKE ONE TABLET BY MOUTH EVERY DAY 90 tablet 3  ? famotidine (PEPCID) 40 MG tablet Take 1 tablet (40 mg total) by mouth daily. 90 tablet 3  ? fexofenadine (ALLEGRA) 180 MG tablet Take 180 mg by mouth daily. Take 1 tablet daily for allergies    ? fluticasone (FLONASE) 50 MCG/ACT nasal spray Place 2 sprays into both nostrils daily. 16 g 0  ? lisinopril (ZESTRIL) 5 MG tablet TAKE ONE TABLET BY MOUTH DAILY 90 tablet 1  ? Magnesium 500 MG CAPS Take 500 mg by mouth daily.    ? Omega-3 Fatty Acids (FISH OIL) 1000 MG CAPS Take 2,000 mg by mouth daily.    ?  Probiotic Product (PROBIOTIC DAILY PO) Take 1 capsule by mouth daily.    ? tolterodine (DETROL LA) 4 MG 24 hr capsule Take 4 mg by mouth daily.    ? nitroGLYCERIN (NITROSTAT) 0.4 MG SL tablet Place 1 tablet (0.4

## 2022-01-12 NOTE — Patient Instructions (Addendum)
If you are age 71 or older, your body mass index should be between 23-30. Your There is no height or weight on file to calculate BMI. If this is out of the aforementioned range listed, please consider follow up with your Primary Care Provider. ? ?__________________________________________________________ ? ?The [facility] providers would like to encourage you to use [facility] to communicate with providers for non-urgent requests or questions.  Due to long hold times on the telephone, sending your provider a message by [facility] may be a faster and more efficient way to get a response.  Please allow 48 business hours for a response.  Please remember that this is for non-urgent requests.  ? ?Due to recent changes in healthcare laws, you may see the results of your imaging and laboratory studies on MyChart before your provider has had a chance to review them.  We understand that in some cases there may be results that are confusing or concerning to you. Not all laboratory results come back in the same time frame and the provider may be waiting for multiple results in order to interpret others.  Please give [place] 48 hours in order for your provider to thoroughly review all the results before contacting the office for clarification of your results.  ? ?You have been scheduled for an endoscopy. Please follow written instructions given to you at your visit today. ?If you use inhalers (even only as needed), please bring them with you on the day of your procedure.  ? ? ?Thank you for choosing me and [facility]. ? ?Gerrit Heck, D.O. ? ? ?We want to thank you for trusting [facility] with your care. All of our staff and providers value the relationships we have built with our patients, and it is an honor to care for you.  ? ?We are writing to let you know that [facility] [place] will close on Feb 07, 2022, and we invite you to continue to see Dr. Carmell Austria and Gerrit Heck  at the [facility] [place] office location. We are consolidating our serices at these [facility] practices to better provide care. Our office staff will work with you to ensure a seamless transition.  ? ?Gerrit Heck, DO -Dr. Bryan Lemma will be movig to [facility] at [address], [place], [place] [address], effective Feb 07, 2022.  Contact [phone] to schedule an appointment with him.  ? ?Carmell Austria, MD- Dr. Lyndel Safe will be movig to [facility] at [address], [place], [place] [address], effective Feb 07, 2022.  Contact [phone] to schedule an appointment with him.  ? ?Requesting Medical Records ?If you need to request your medical records, please follow the instructions below. Your medical records are confidential, and a copy can be transferred to another provider or released to you or another person you designate only with your permission. ? ?There are several ways to request your medical records: ?Requests for medical records can be submitted through our practice.   ?You can also request your records electronically, in your MyChart account by selecting the ?Request Health Records? tab.  ?If you need additional information on how to request records, please go to [email or web address]/, choose Patient Information, then select Request Medical Records. ?To make an appointment or if you have any questions about your health care needs, please contact our office at [phone] and one of our staff members will be glad to assist you. ?[facility] is committed to providing exceptional care for you and  our community. Thank you for allowing [place] to serve your health care needs. ?Sincerely, ? ?Windy Canny, Director [facility] ?[facility] also offers convenient virtual care options. Sore throat? Sinus problems? Cold or flu symptoms? Get care from the comfort of home with [facility] Video Visits and e-Visits. Learn more about the non-emergency conditions  treated and start your virtual visit at [email or web address] ? ? ?

## 2022-01-15 ENCOUNTER — Other Ambulatory Visit: Payer: Self-pay | Admitting: Family Medicine

## 2022-01-17 NOTE — Telephone Encounter (Signed)
Requesting: diazepam ?Contract: 01/29/20 ?UDS: 07/31/20 ?Last Visit: 12/28/222 ?Next Visit: 02/09/22 ?Last Refill: 08/05/21(60,5) ? ?RF request for flonase ?LOV: 09/22/21 ?Next ov: 02/09/22 ?Last written: 12/13/21(16g,0) ? ? ?Please Advise. Meds pending ? ? ? ?

## 2022-01-21 ENCOUNTER — Telehealth: Payer: Self-pay

## 2022-01-21 ENCOUNTER — Encounter: Payer: Self-pay | Admitting: Gastroenterology

## 2022-01-21 ENCOUNTER — Ambulatory Visit (AMBULATORY_SURGERY_CENTER): Payer: PPO | Admitting: Gastroenterology

## 2022-01-21 VITALS — BP 121/70 | HR 73 | Temp 97.3°F | Resp 18 | Ht 64.0 in | Wt 133.0 lb

## 2022-01-21 DIAGNOSIS — R1013 Epigastric pain: Secondary | ICD-10-CM

## 2022-01-21 DIAGNOSIS — K319 Disease of stomach and duodenum, unspecified: Secondary | ICD-10-CM | POA: Diagnosis not present

## 2022-01-21 DIAGNOSIS — K449 Diaphragmatic hernia without obstruction or gangrene: Secondary | ICD-10-CM | POA: Diagnosis not present

## 2022-01-21 DIAGNOSIS — R110 Nausea: Secondary | ICD-10-CM | POA: Diagnosis not present

## 2022-01-21 DIAGNOSIS — J449 Chronic obstructive pulmonary disease, unspecified: Secondary | ICD-10-CM | POA: Diagnosis not present

## 2022-01-21 DIAGNOSIS — R12 Heartburn: Secondary | ICD-10-CM | POA: Diagnosis not present

## 2022-01-21 DIAGNOSIS — Z9889 Other specified postprocedural states: Secondary | ICD-10-CM

## 2022-01-21 DIAGNOSIS — I10 Essential (primary) hypertension: Secondary | ICD-10-CM | POA: Diagnosis not present

## 2022-01-21 DIAGNOSIS — I2510 Atherosclerotic heart disease of native coronary artery without angina pectoris: Secondary | ICD-10-CM | POA: Diagnosis not present

## 2022-01-21 DIAGNOSIS — K219 Gastro-esophageal reflux disease without esophagitis: Secondary | ICD-10-CM | POA: Diagnosis not present

## 2022-01-21 MED ORDER — SODIUM CHLORIDE 0.9 % IV SOLN: 500.0000 mL | Freq: Once | INTRAVENOUS | Status: DC

## 2022-01-21 NOTE — Progress Notes (Signed)
Report to PACU, RN, vss, BBS= Clear.  

## 2022-01-21 NOTE — Progress Notes (Signed)
VS by DT  Pt's states no medical or surgical changes since previsit or office visit.  

## 2022-01-21 NOTE — Telephone Encounter (Signed)
Report sent through epic.   ?

## 2022-01-21 NOTE — Patient Instructions (Signed)
Resume Plavix today ?Recommended to F/U with Dr. Kipp Brood in the cardiothoracic surgery clinic ? ? ?YOU HAD AN ENDOSCOPIC PROCEDURE TODAY: Refer to the procedure report and other information in the discharge instructions given to you for any specific questions about what was found during the examination. If this information does not answer your questions, please call [facility] office at [phone] to clarify.  ? ?YOU SHOULD EXPECT: Some feelings of bloating in the abdomen. Passage of more gas than usual. Walking can help get rid of the air that was put into your GI tract during the procedure and reduce the bloating. If you had a lower endoscopy (such as a colonoscopy or flexible sigmoidoscopy) you may notice spotting of blood in your stool or on the toilet paper. Some abdominal soreness may be present for a day or two, also. ? ?DIET: Your first meal following the procedure should be a light meal and then it is ok to progress to your normal diet. A half-sandwich or bowl of soup is an example of a good first meal. Heavy or fried foods are harder to digest and may make you feel nauseous or bloated. Drink plenty of fluids but you should avoid alcoholic beverages for 24 hours. If you had a esophageal dilation, please see attached instructions for diet.   ? ?ACTIVITY: Your care partner should take you home directly after the procedure. You should plan to take it easy, moving slowly for the rest of the day. You can resume normal activity the day after the procedure however YOU SHOULD NOT DRIVE, use power tools, machinery or perform tasks that involve climbing or major physical exertion for 24 hours (because of the sedation medicines used during the test).  ? ?SYMPTOMS TO REPORT IMMEDIATELY: ?A gastroenterologist can be reached at any hour. Please call [phone]  for any of the following symptoms:  ?Following lower endoscopy (colonoscopy, flexible sigmoidoscopy) ?Excessive amounts of blood in the stool  ?Significant  tenderness, worsening of abdominal pains  ?Swelling of the abdomen that is new, acute  ?Fever of 100? or higher  ?Following upper endoscopy (EGD, EUS, ERCP, esophageal dilation) ?Vomiting of blood or coffee ground material  ?New, significant abdominal pain  ?New, significant chest pain or pain under the shoulder blades  ?Painful or persistently difficult swallowing  ?New shortness of breath  ?Black, tarry-looking or red, bloody stools ? ?FOLLOW UP:  ?If any biopsies were taken you will be contacted by phone or by letter within the next 1-3 weeks. Call [phone]  if you have not heard about the biopsies in 3 weeks.  ?Please also call with any specific questions about appointments or follow up tests.  ? ? ?

## 2022-01-21 NOTE — Progress Notes (Signed)
Called to room to assist during endoscopic procedure.  Patient ID and intended procedure confirmed with present staff. Received instructions for my participation in the procedure from the performing physician.  

## 2022-01-21 NOTE — Telephone Encounter (Signed)
-----   Message from [place], DO sent at 01/21/2022 11:07 AM EDT ----- ?Per patient request, can you please send a copy of her EGD report from today to Dr. Abran Duke office? Thanks ? ? ?

## 2022-01-21 NOTE — Progress Notes (Signed)
? ?GASTROENTEROLOGY PROCEDURE H&P NOTE  ? ?Primary Care Physician: ?McGowen, Adrian Blackwater, MD ? ? ? ?Reason for Procedure:   Nausea, GERD, MEG pain, hx of fundoplication, regurgitation, abnormal esophagram and CT, hiatal hernia ? ?Plan:    EGD ? ?Patient is appropriate for endoscopic procedure(s) in the ambulatory ([facility]) setting. ? ?The nature of the procedure, as well as the risks, benefits, and alternatives were carefully and thoroughly reviewed with the patient. Ample time for discussion and questions allowed. The patient understood, was satisfied, and agreed to proceed.  ? ? ? ?HPI: ?[name] is a 71 y.o. female who presents for EGD to evaluate for breakthrough reflux after prior Robotic repair of paraesophageal hernia and Dor fundoplication in 09/4429. ? ?Past Medical History:  ?Diagnosis Date  ? Abdominal bruit 04/2017  ? Aortic u/s showed NO ANEURISM.  ? Allergy   ? SEASONAL  ? Anemia   ? low iron  ? Anxiety   ? Carotid artery occlusion   ? hx L carotid stent  ? Cataract   ? COPD (chronic obstructive pulmonary disease) ([place])   ? DOE, worse since 11/2018.  Stress test neg 01/2019.  Albut x 1 inh prn helpful some.  Pulm 06/2019, PFTs not diag of COPD, DOE likely from deconditioning.  ? Essential hypertension, benign 05/22/2014  ? GERD (gastroesophageal reflux disease)   ? + hx of esophagitis  ? [name] syndrome   ? H/O hiatal hernia 02/2019  ? EGD-->moderate size->surgery recommended but pt declined.  ? Hematochezia 2020/21  ? internal hemorrhoids  ? History of iron deficiency anemia 07/2019  ? Dr. Bryan Lemma EGD and colonoscopy, some anorectal muscosal ulceration, o/w just mild chronic gastritis.   Also could be poor absorption due to chronic high dose acid suppression. Hb/iron normalized aft iron infus and ongoing oral iron.  ? History of kidney stones   ? one time  ? Mitral valve prolapse   ? Mixed hyperlipidemia 05/22/2014  ? OAB (overactive bladder)   ? detrol LA from urol helpful  ? Osteopenia  06/06/2016  ? 05/2016 DEXA T-score -1.5.  06/2018 DEXA T score -1.1.  T score -1.2 06/2021  ? Peripheral vascular disease, unspecified ([place]) 05/22/2014  ? innominate and carotid.  U/S f/u 12/2016 showed no signif change compared to 2017--vascular recommended repeat 1 yr.  Doing well as of 08/2018 cardiol f/u-->continued on ASA and Plavix. F/u 08/2019->ASA d/c'd, plavix continued.  ? Prediabetes 2018  ? 2018 A1c 6.3%.  05/2018 A1c 6.1%. 07/2019 A1c 6.4%.  ? TIA (transient ischemic attack) 2006  ? Tobacco abuse, in remission   ? quit 2009  ? ? ?Past Surgical History:  ?Procedure Laterality Date  ? ABDOMINAL HYSTERECTOMY  1997  ? CARDIAC CATHETERIZATION  01/2012  ? NORMAL  ? CARDIOVASCULAR STRESS TEST  02/05/2019  ? NORMAL.  EF normal.  ? CAROTID ENDARTERECTOMY  08/21/2007  ? right - Dr. Amedeo Plenty  ? CHOLECYSTECTOMY  2000  ? COLONOSCOPY  approx 2008; 09/23/19  ? 2008 (Dr. Lenon Ahmadi pt->normal.  08/2019 adenoma, some friable areas of colon + anorectal mucosa inflam w/ ulceration, +diverticulosis. Recall 5 yrs  ? DEXA  06/06/2016  ? 05/2016 T-score -1.5.  06/2018 T score -1.1.  T score -1.2 06/2021.  ? ESOPHAGOGASTRODUODENOSCOPY  03/11/2019; 09/23/19  ? 02/2019; 5 cm hiatal hernia; GI suggested surgical repair due to intractable GER.  08/2019 mild chronic gastritis, h pylori neg, multiple benign bx's including duodenum neg for celiac changes.  ? ESOPHAGOGASTRODUODENOSCOPY N/A 01/18/2021  ?  Procedure: ESOPHAGOGASTRODUODENOSCOPY (EGD);  Surgeon: [name], MD;  Location: [facility];  Service: Thoracic;  Laterality: N/A;  ? Incision and drainage of left thenar space abscess  02/23/2015  ? s/p cat bite (Dr. Amedeo Plenty)  ? innominate stent  2007  ? 12/2014 u/s showed patent stent in innominate and L common carotid.  L prox int carotid 40-59% stenosis  ? LDCT for lung ca screening  06/2019  ? NEG->rpt 1 yr  ? Left common carotid stent  2006 and 2007  ? "                    "                  "                          "                              "  ? TRANSTHORACIC ECHOCARDIOGRAM  2007  ? NORMAL  ? US CAROTID DOPPLER BILATERAL ([facility])  06/2019  ? left stent w/out stenosis.  R ICA sten 40-50% obst-stable.  07/20/21 R clear, L 40-59%, normal vert and subclav flow.  ? XI ROBOTIC ASSISTED PARAESOPHAGEAL HERNIA REPAIR N/A 01/18/2021  ? Procedure: XI ROBOTIC ASSISTED PARAESOPHAGEAL HERNIA REPAIR WITH FUNDOPLICATION WITH MESH;  Surgeon: [name], MD;  Location: [facility];  Service: Thoracic;  Laterality: N/A;  ? ? ?Prior to Admission medications   ?Medication Sig Start Date End Date Taking? Authorizing Provider  ?atorvastatin (LIPITOR) 80 MG tablet TAKE ONE TABLET BY MOUTH DAILY 08/05/21  Yes McGowen, Adrian Blackwater, MD  ?cholecalciferol (VITAMIN D) 1000 UNITS tablet Take 1,000 Units by mouth daily.   Yes [provider]  ?clopidogrel (PLAVIX) 75 MG tablet TAKE ONE TABLET BY MOUTH DAILY 08/10/21  Yes McGowen, Adrian Blackwater, MD  ?diazepam (VALIUM) 2 MG tablet Take 1 tablet (2 mg total) by mouth 2 (two) times daily. 01/17/22  Yes McGowen, Adrian Blackwater, MD  ?estradiol (ESTRACE) 0.1 MG/GM vaginal cream Place 1 Applicatorful vaginally 2 (two) times a week. 05/27/20  Yes [provider]  ?ezetimibe (ZETIA) 10 MG tablet TAKE ONE TABLET BY MOUTH EVERY DAY 05/11/21  Yes McGowen, Adrian Blackwater, MD  ?famotidine (PEPCID) 40 MG tablet Take 1 tablet (40 mg total) by mouth daily. 08/03/21  Yes McGowen, Adrian Blackwater, MD  ?fexofenadine (ALLEGRA) 180 MG tablet Take 180 mg by mouth daily. Take 1 tablet daily for allergies   Yes [provider]  ?fluticasone (FLONASE) 50 MCG/ACT nasal spray Place 2 sprays into both nostrils daily. 01/17/22  Yes McGowen, Adrian Blackwater, MD  ?lisinopril (ZESTRIL) 5 MG tablet TAKE ONE TABLET BY MOUTH DAILY 08/10/21  Yes McGowen, Adrian Blackwater, MD  ?Magnesium 500 MG CAPS Take 500 mg by mouth daily.   Yes [provider]  ?Omega-3 Fatty Acids (FISH OIL) 1000 MG CAPS Take 2,000 mg by mouth daily.   Yes [provider]  ?Probiotic  Product (PROBIOTIC DAILY PO) Take 1 capsule by mouth daily.   Yes [provider]  ?tolterodine (DETROL LA) 4 MG 24 hr capsule Take 4 mg by mouth daily. 05/27/20  Yes [provider]  ?albuterol (VENTOLIN HFA) 108 (90 Base) MCG/ACT inhaler Inhale 2 puffs into the lungs every 6 (six) hours as needed for wheezing or shortness of breath. 11/09/20  Garner Nash, DO  ?nitroGLYCERIN (NITROSTAT) 0.4 MG SL tablet Place 1 tablet (0.4 mg total) under the tongue every 5 (five) minutes as needed for chest pain. ?Patient not taking: Reported on 01/12/2022 05/12/20   Jettie Booze, MD  ? ? ?Current Outpatient Medications  ?Medication Sig Dispense Refill  ? atorvastatin (LIPITOR) 80 MG tablet TAKE ONE TABLET BY MOUTH DAILY 90 tablet 3  ? cholecalciferol (VITAMIN D) 1000 UNITS tablet Take 1,000 Units by mouth daily.    ? clopidogrel (PLAVIX) 75 MG tablet TAKE ONE TABLET BY MOUTH DAILY 90 tablet 1  ? diazepam (VALIUM) 2 MG tablet Take 1 tablet (2 mg total) by mouth 2 (two) times daily. 60 tablet 5  ? estradiol (ESTRACE) 0.1 MG/GM vaginal cream Place 1 Applicatorful vaginally 2 (two) times a week.    ? ezetimibe (ZETIA) 10 MG tablet TAKE ONE TABLET BY MOUTH EVERY DAY 90 tablet 3  ? famotidine (PEPCID) 40 MG tablet Take 1 tablet (40 mg total) by mouth daily. 90 tablet 3  ? fexofenadine (ALLEGRA) 180 MG tablet Take 180 mg by mouth daily. Take 1 tablet daily for allergies    ? fluticasone (FLONASE) 50 MCG/ACT nasal spray Place 2 sprays into both nostrils daily. 16 g 6  ? lisinopril (ZESTRIL) 5 MG tablet TAKE ONE TABLET BY MOUTH DAILY 90 tablet 1  ? Magnesium 500 MG CAPS Take 500 mg by mouth daily.    ? Omega-3 Fatty Acids (FISH OIL) 1000 MG CAPS Take 2,000 mg by mouth daily.    ? Probiotic Product (PROBIOTIC DAILY PO) Take 1 capsule by mouth daily.    ? tolterodine (DETROL LA) 4 MG 24 hr capsule Take 4 mg by mouth daily.    ? albuterol (VENTOLIN HFA) 108 (90 Base) MCG/ACT inhaler Inhale 2 puffs into the lungs  every 6 (six) hours as needed for wheezing or shortness of breath. 8 g 6  ? nitroGLYCERIN (NITROSTAT) 0.4 MG SL tablet Place 1 tablet (0.4 mg total) under the tongue every 5 (five) minutes as needed for ches

## 2022-01-21 NOTE — Op Note (Signed)
[facility] ?Patient Name: [name] ?Procedure Date: 01/21/2022 9:54 AM ?MRN: [id number] ?Endoscopist: Gerrit Heck , MD ?Age: 71 ?Referring MD:  ?Date of Birth: 12-Dec-1950 ?Gender: Female ?Account #: [id number] ?Procedure:                Upper GI endoscopy ?Indications:              Epigastric abdominal pain, Heartburn, Suspected  ?                          esophageal reflux, Nausea ?                          71 yo s/p Robotic repair of paraesophageal hernia  ?                          and Dor fundoplication with mesh in 12/2020. Has  ?                          since had recurrence of reflux symptoms (heartburn,  ?                          regurgitation). Subsequent Esophagram on 08/30/2021  ?                          notable for small residual/recurrent hiatal hernia,  ?                          esophageal stasis with increased tertiary  ?                          contractions without reflux, stricture, or  ?                          ulceration. CT in 10/2021 with moderate size hiatal  ?                          hernia. Additionally, she has MEG pain and nausea. ?Medicines:                Monitored Anesthesia Care ?Procedure:                Pre-Anesthesia Assessment: ?                          - Prior to the procedure, a History and Physical  ?                          was performed, and patient medications and  ?                          allergies were reviewed. The patient's tolerance of  ?                          previous anesthesia was also reviewed. The risks  ?  and benefits of the procedure and the sedation  ?                          options and risks were discussed with the patient.  ?                          All questions were answered, and informed consent  ?                          was obtained. Prior Anticoagulants: The patient has  ?                          taken Plavix (clopidogrel), last dose was 1 day  ?                          prior to procedure. ASA  Grade Assessment: III - A  ?                          patient with severe systemic disease. After  ?                          reviewing the risks and benefits, the patient was  ?                          deemed in satisfactory condition to undergo the  ?                          procedure. ?                          After obtaining informed consent, the endoscope was  ?                          passed under direct vision. Throughout the  ?                          procedure, the patient's blood pressure, pulse, and  ?                          oxygen saturations were monitored continuously. The  ?                          Endoscope was introduced through the mouth, and  ?                          advanced to the second part of duodenum. The upper  ?                          GI endoscopy was accomplished without difficulty.  ?                          The patient tolerated the procedure well. ?Scope In: ?Scope Out: ?Findings:  The lower third of the esophagus was mildly  ?                          tortuous. ?                          The Z-line was regular and was found 33 cm from the  ?                          incisors. ?                          A 2 cm hiatal hernia was present. This measured  ?                          approximately 2.5 cm in transvere width as well. ?                          Evidence of a prior Dor fundoplication was found in  ?                          the cardia. This was characterized by healthy  ?                          appearing mucosa. The fundoplication was largely  ?                          intact per intended post operative anatomy of that  ?                          surgery. There was gapping in the posterior part of  ?                          the valve. However, the entire fundoplication was  ?                          located cephalad to the diaphragmatic hiatus,  ?                          consistent with herniation of the fundoplication. ?                           The gastric fundus, gastric body, gastric antrum  ?                          and pylorus were normal. Biopsies were taken with a  ?                          cold forceps for Helicobacter pylori testing.  ?                          Estimated blood loss was minimal. ?  The examined duodenum was normal. ?Complications:            No immediate complications. ?Estimated Blood Loss:     Estimated blood loss was minimal. ?Impression:               - Tortuous esophagus. ?                          - Z-line regular, 33 cm from the incisors. ?                          - 2 cm axial height x 2.5 cm transverse width  ?                          hiatal hernia, with fundplication tissue located  ?                          within the hernia. ?                          - A Dor fundoplication was found, characterized by  ?                          healthy appearing mucosa. ?                          - Normal gastric fundus, gastric body, antrum and  ?                          pylorus. Biopsied. ?                          - Normal examined duodenum. ?Recommendation:           - Patient has a contact number available for  ?                          emergencies. The signs and symptoms of potential  ?                          delayed complications were discussed with the  ?                          patient. Return to normal activities tomorrow.  ?                          Written discharge instructions were provided to the  ?                          patient. ?                          - Resume previous diet. ?                          - Continue present medications. ?                          -  Resume Plavix (clopidogrel) at prior dose today. ?                          - Await pathology results. ?                          - Recommend follow-up with Dr. Kipp Brood in the  ?                          [facility]. ?                          - Return to GI clinic at appointment to be  ?                           scheduled. ?Gerrit Heck, MD ?01/21/2022 10:30:19 AM ?

## 2022-01-24 ENCOUNTER — Other Ambulatory Visit: Payer: Self-pay

## 2022-01-24 ENCOUNTER — Emergency Department (HOSPITAL_BASED_OUTPATIENT_CLINIC_OR_DEPARTMENT_OTHER)
Admission: EM | Admit: 2022-01-24 | Discharge: 2022-01-24 | Discharge status: Against Medical Advice | Payer: PPO | Attending: Emergency Medicine | Admitting: Emergency Medicine

## 2022-01-24 ENCOUNTER — Telehealth: Payer: Self-pay | Admitting: Gastroenterology

## 2022-01-24 ENCOUNTER — Emergency Department (HOSPITAL_BASED_OUTPATIENT_CLINIC_OR_DEPARTMENT_OTHER): Payer: PPO

## 2022-01-24 ENCOUNTER — Encounter (HOSPITAL_BASED_OUTPATIENT_CLINIC_OR_DEPARTMENT_OTHER): Payer: Self-pay

## 2022-01-24 DIAGNOSIS — I10 Essential (primary) hypertension: Secondary | ICD-10-CM | POA: Insufficient documentation

## 2022-01-24 DIAGNOSIS — J449 Chronic obstructive pulmonary disease, unspecified: Secondary | ICD-10-CM | POA: Insufficient documentation

## 2022-01-24 DIAGNOSIS — R0602 Shortness of breath: Secondary | ICD-10-CM | POA: Insufficient documentation

## 2022-01-24 DIAGNOSIS — R0789 Other chest pain: Secondary | ICD-10-CM | POA: Diagnosis not present

## 2022-01-24 DIAGNOSIS — Z79899 Other long term (current) drug therapy: Secondary | ICD-10-CM | POA: Insufficient documentation

## 2022-01-24 DIAGNOSIS — R112 Nausea with vomiting, unspecified: Secondary | ICD-10-CM | POA: Diagnosis not present

## 2022-01-24 DIAGNOSIS — R079 Chest pain, unspecified: Secondary | ICD-10-CM

## 2022-01-24 DIAGNOSIS — Z7951 Long term (current) use of inhaled steroids: Secondary | ICD-10-CM | POA: Diagnosis not present

## 2022-01-24 DIAGNOSIS — R072 Precordial pain: Secondary | ICD-10-CM | POA: Diagnosis not present

## 2022-01-24 LAB — TROPONIN I (HIGH SENSITIVITY): Troponin I (High Sensitivity): 2 ng/L (ref <18)

## 2022-01-24 LAB — CBC
HCT: 44.7 % (ref 36.0–46.0)
Hemoglobin: 14.9 g/dL (ref 12.0–15.0)
MCH: 30.6 pg (ref 26.0–34.0)
MCHC: 33.3 g/dL (ref 30.0–36.0)
MCV: 91.8 fL (ref 80.0–100.0)
Platelets: 251 10*3/uL (ref 150–400)
RBC: 4.87 MIL/uL (ref 3.87–5.11)
RDW: 12.6 % (ref 11.5–15.5)
WBC: 6.5 10*3/uL (ref 4.0–10.5)
nRBC: 0 % (ref 0.0–0.2)

## 2022-01-24 LAB — COMPREHENSIVE METABOLIC PANEL
ALT: 22 U/L (ref 0–44)
AST: 22 U/L (ref 15–41)
Albumin: 4.4 g/dL (ref 3.5–5.0)
Alkaline Phosphatase: 83 U/L (ref 38–126)
Anion gap: 9 (ref 5–15)
BUN: 13 mg/dL (ref 8–23)
CO2: 26 mmol/L (ref 22–32)
Calcium: 10 mg/dL (ref 8.9–10.3)
Chloride: 105 mmol/L (ref 98–111)
Creatinine, Ser: 0.73 mg/dL (ref 0.44–1.00)
GFR, Estimated: >60 mL/min (ref >60)
Glucose, Bld: 104 mg/dL — ABNORMAL HIGH (ref 70–99)
Potassium: 3.9 mmol/L (ref 3.5–5.1)
Sodium: 140 mmol/L (ref 135–145)
Total Bilirubin: 1.6 mg/dL — ABNORMAL HIGH (ref 0.3–1.2)
Total Protein: 7 g/dL (ref 6.5–8.1)

## 2022-01-24 LAB — URINALYSIS, ROUTINE W REFLEX MICROSCOPIC
Bilirubin Urine: NEGATIVE
Glucose, UA: NEGATIVE mg/dL
Hgb urine dipstick: NEGATIVE
Ketones, ur: NEGATIVE mg/dL
Leukocytes,Ua: NEGATIVE
Nitrite: NEGATIVE
Protein, ur: NEGATIVE mg/dL
Specific Gravity, Urine: 1.007 (ref 1.005–1.030)
pH: 7.5 (ref 5.0–8.0)

## 2022-01-24 LAB — LIPASE, BLOOD: Lipase: 29 U/L (ref 11–51)

## 2022-01-24 MED ORDER — ONDANSETRON 4 MG PO TBDP
4.0000 mg | ORAL_TABLET | Freq: Once | ORAL | Status: AC | PRN
  Administered 2022-01-24: 4 mg via ORAL
  Filled 2022-01-24: qty 1

## 2022-01-24 NOTE — ED Notes (Signed)
Pt a/ox4, NAD, informed of risks and consequences for not completing medical care. Pt verbalizes understanding of risks and consequences for not staying to complete medical care including having serial troponin drawn. Pt given DC papers by Rueben Bash and this RN ?

## 2022-01-24 NOTE — ED Provider Notes (Signed)
[facility] EMERGENCY DEPT Provider Note   CSN: [id number] Arrival date & time: 01/24/22  1045     History  Chief Complaint  Patient presents with   Nausea    [name] is a 71 y.o. female.  Patient complains of nausea, substernal chest pain/burning, shortness of breath.  Patient states that she began having these feelings yesterday afternoon.  Describes a feeling in the substernal region of chest pain as both pressure and burning.  Patient denies radiation of symptoms. Patient states that yesterday she became short of breath.  She does have history of COPD but states that she does not use a rescue inhaler.  The patient also endorses nausea without vomiting.  She states that she has had consistent nausea since having hiatal hernia surgery 1 year ago in April.  Past medical history significant for carotid artery occlusion, hyperlipidemia, peripheral vascular disease, anxiety, COPD, hypertension, hiatal hernia, GERD, hiatal hernia surgery April 2022  HPI     Home Medications Prior to Admission medications   Medication Sig Start Date End Date Taking? Authorizing Provider  albuterol (VENTOLIN HFA) 108 (90 Base) MCG/ACT inhaler Inhale 2 puffs into the lungs every 6 (six) hours as needed for wheezing or shortness of breath. 11/09/20   Icard, [name], DO  atorvastatin (LIPITOR) 80 MG tablet TAKE ONE TABLET BY MOUTH DAILY 08/05/21   McGowen, Adrian Blackwater, MD  cholecalciferol (VITAMIN D) 1000 UNITS tablet Take 1,000 Units by mouth daily.    [provider]  clopidogrel (PLAVIX) 75 MG tablet TAKE ONE TABLET BY MOUTH DAILY 08/10/21   McGowen, Adrian Blackwater, MD  diazepam (VALIUM) 2 MG tablet Take 1 tablet (2 mg total) by mouth 2 (two) times daily. 01/17/22   McGowen, Adrian Blackwater, MD  estradiol (ESTRACE) 0.1 MG/GM vaginal cream Place 1 Applicatorful vaginally 2 (two) times a week. 05/27/20   [provider]  ezetimibe (ZETIA) 10 MG tablet TAKE ONE TABLET BY MOUTH EVERY DAY  05/11/21   McGowen, Adrian Blackwater, MD  famotidine (PEPCID) 40 MG tablet Take 1 tablet (40 mg total) by mouth daily. 08/03/21   McGowen, Adrian Blackwater, MD  fexofenadine (ALLEGRA) 180 MG tablet Take 180 mg by mouth daily. Take 1 tablet daily for allergies    [provider]  fluticasone (FLONASE) 50 MCG/ACT nasal spray Place 2 sprays into both nostrils daily. 01/17/22   McGowen, Adrian Blackwater, MD  lisinopril (ZESTRIL) 5 MG tablet TAKE ONE TABLET BY MOUTH DAILY 08/10/21   McGowen, Adrian Blackwater, MD  Magnesium 500 MG CAPS Take 500 mg by mouth daily.    [provider]  nitroGLYCERIN (NITROSTAT) 0.4 MG SL tablet Place 1 tablet (0.4 mg total) under the tongue every 5 (five) minutes as needed for chest pain. Patient not taking: Reported on 01/12/2022 05/12/20   Jettie Booze, MD  Omega-3 Fatty Acids (FISH OIL) 1000 MG CAPS Take 2,000 mg by mouth daily.    [provider]  Probiotic Product (PROBIOTIC DAILY PO) Take 1 capsule by mouth daily.    [provider]  tolterodine (DETROL LA) 4 MG 24 hr capsule Take 4 mg by mouth daily. 05/27/20   [provider]      Allergies    Doxycycline, Fentanyl, Fluocinolone, Meperidine, Penicillins, Breo ellipta [fluticasone furoate-vilanterol], and Imdur [isosorbide nitrate]    Review of Systems   Review of Systems  Constitutional:  Negative for fever.  Respiratory:  Positive for chest tightness and shortness of breath. Negative for cough.  Cardiovascular:  Positive for chest pain.  Gastrointestinal:  Positive for nausea and vomiting. Negative for abdominal pain and diarrhea.   Physical Exam Updated Vital Signs BP 109/65 (BP Location: Right Arm)   Pulse 85   Temp 97.7 F (36.5 C) (Oral)   Resp 16   Ht '5\' 4"'$  (1.626 m)   Wt 60.3 kg   SpO2 97%   BMI 22.83 kg/m  Physical Exam Vitals and nursing note reviewed.  Constitutional:      General: She is not in acute distress. HENT:     Head: Normocephalic.     Nose: Nose normal.   Eyes:     Conjunctiva/sclera: Conjunctivae normal.  Cardiovascular:     Rate and Rhythm: Normal rate and regular rhythm.     Pulses: Normal pulses.     Heart sounds: Normal heart sounds.  Pulmonary:     Effort: Pulmonary effort is normal.     Breath sounds: Normal breath sounds.  Abdominal:     Palpations: Abdomen is soft.     Tenderness: There is no abdominal tenderness. There is no right CVA tenderness or left CVA tenderness.  Musculoskeletal:     Cervical back: Normal range of motion.  Skin:    General: Skin is warm and dry.  Neurological:     Mental Status: She is alert and oriented to person, place, and time.    ED Results / Procedures / Treatments   Labs (all labs ordered are listed, but only abnormal results are displayed) Labs Reviewed  COMPREHENSIVE METABOLIC PANEL - Abnormal; Notable for the following components:      Result Value   Glucose, Bld 104 (*)    Total Bilirubin 1.6 (*)    All other components within normal limits  URINALYSIS, ROUTINE W REFLEX MICROSCOPIC - Abnormal; Notable for the following components:   Color, Urine COLORLESS (*)    All other components within normal limits  LIPASE, BLOOD  CBC  TROPONIN I (HIGH SENSITIVITY)  TROPONIN I (HIGH SENSITIVITY)    EKG EKG Interpretation  Date/Time:  Monday Jan 24 2022 11:21:44 EDT Ventricular Rate:  93 PR Interval:  128 QRS Duration: 70 QT Interval:  366 QTC Calculation: 455 R Axis:   78 Text Interpretation: Normal sinus rhythm Low voltage QRS Borderline ECG When compared with ECG of 14-Jan-2021 14:02, No significant change was found Confirmed by Thamas Jaegers ([id number]) on 01/24/2022 1:33:59 PM  Radiology DG Chest Port 1 View  Result Date: 01/24/2022 CLINICAL DATA:  Chest pain and shortness of breath. EXAM: PORTABLE CHEST 1 VIEW COMPARISON:  Chest two views 02/26/2021 FINDINGS: Cardiac silhouette and mediastinal contours are within normal limits. Mild calcification within the aortic arch. There is again  a vascular stent overlying the superomedial right mediastinum. This is in the region of the right brachiocephalic and left common carotid arteries. Mild curvilinear opacification overlying the bilateral costophrenic angles. Likely tiny right pleural effusion. No pneumothorax. Mild levocurvature of the lower thoracic spine. IMPRESSION: Curvilinear subsegmental atelectasis versus scarring overlying the costophrenic angles appears new compared to 02/26/2021 radiographs. Probable tiny new right pleural effusion. Electronically Signed   By: Yvonne Kendall M.D.   On: 01/24/2022 13:56    Procedures Procedures    Medications Ordered in ED Medications  ondansetron (ZOFRAN-ODT) disintegrating tablet 4 mg (4 mg Oral Given 01/24/22 1126)    ED Course/ Medical Decision Making/ A&P  Medical Decision Making Amount and/or Complexity of Data Reviewed Labs: ordered. Radiology: ordered.  Risk Prescription drug management.   This patient presents to the ED for concern of chest pain, nausea, shortness of breath, this involves an extensive number of treatment options, and is a complaint that carries with it a high risk of complications and morbidity.  The differential diagnosis includes ACS, GERD, PE, anxiety, hiatal hernia, others   Co morbidities that complicate the patient evaluation  Hx of hiatal hernia surgery, COPD   Additional history obtained:   External records from outside source obtained and reviewed including GI notes from April 2023   Lab Tests:  I Ordered, and personally interpreted labs.  The pertinent results include:  Troponin 2,    Imaging Studies ordered:  I ordered imaging studies including chest x-ray I independently visualized and interpreted imaging which showed Curvilinear subsegmental atelectasis versus scarring overlying the costophrenic angles appears new compared to 02/26/2021 radiographs. Probable tiny new right pleural effusion.  I agree  with the radiologist interpretation   Cardiac Monitoring: / EKG:  The patient was maintained on a cardiac monitor.  I personally viewed and interpreted the cardiac monitored which showed an underlying rhythm of: Sinus rhythm   Problem List / ED Course / Critical interventions / Medication management   I ordered medication including Zofran for nausea Reevaluation of the patient after these medicines showed that the patient improved I have reviewed the patients home medicines and have made adjustments as needed  Test / Admission - Considered:  The patient shortness of breath was transient and only lasted for a short while on Sunday afternoon.  She is not tachycardic.  PE very unlikely.  The patient questioned if her shortness of breath could have been due to anxiety.  I stated that this is a diagnosis of exclusion but that it was a possibility.  ACS work-up underway with a heart score of 4 at this time.  Recommendation to stay for second troponin.  Patient states that she is going to leave AMA because she cannot wait for the second troponin to be drawn.  I have given her strict return precautions in relation to chest pain or shortness of breath.  I do feel based on the patient's symptoms and the patient's history that her pain may be related to her hiatal hernia.  I feel that she may benefit from follow-up with the practice to perform the surgery.  Patient left AMA    Final Clinical Impression(s) / ED Diagnoses Final diagnoses:  Chest pain, unspecified type  Shortness of breath  Nausea and vomiting, unspecified vomiting type    Rx / DC Orders ED Discharge Orders     None         Ronny Bacon 01/24/22 1637    Luna Fuse, MD 02/11/22 2218

## 2022-01-24 NOTE — ED Notes (Addendum)
Pt NAD, a/ox4, states she had endoscopy Friday and since then has been having CP, n/v ?

## 2022-01-24 NOTE — Telephone Encounter (Signed)
Returned pt's call and pt states she is currently at emergency room.  ?

## 2022-01-24 NOTE — Telephone Encounter (Signed)
Inbound call from patient stating that she had an EGD on Friday and since the procedure she has not felt well, stated her chest was hurting. Patient is requesting a call back to discuss. Please advise.  ?

## 2022-01-24 NOTE — ED Notes (Signed)
Pa larry at bedside ? ?

## 2022-01-24 NOTE — ED Triage Notes (Signed)
Pt c/o nausea and epigastric pain that radiates to the back. Pt had a hiatal hernia repair in the last year and an endoscopy in the past week. Pt reports that she has associated pain with swallowing and ShOB.  ?

## 2022-01-24 NOTE — ED Notes (Signed)
Patient transported to CT 

## 2022-01-24 NOTE — Discharge Instructions (Signed)
You were seen today for chest pain.  The work-up was currently incomplete with a second troponin still pending.  Please return to the emergency department if you develop worsening shortness of breath, chest pain, or other life threats. ?

## 2022-01-24 NOTE — ED Notes (Signed)
Reminded patient of need for Urine sample ?

## 2022-01-25 ENCOUNTER — Telehealth: Payer: Self-pay

## 2022-01-25 ENCOUNTER — Telehealth: Payer: Self-pay | Admitting: *Deleted

## 2022-01-25 ENCOUNTER — Encounter: Payer: Self-pay | Admitting: Gastroenterology

## 2022-01-25 NOTE — Telephone Encounter (Signed)
Spoke with pt and she stated she is feeling better, she no longer has any chest pain or shortness of breath. Pt states she still has nausea without vomiting. Pt scheduled for a GI clinic f/u appt on 5/9 at 8:20 am with Dr. Bryan Lemma as requested on 4/28 EGD report.  ?

## 2022-01-25 NOTE — Telephone Encounter (Signed)
?  Follow up Call- ? ? ?  01/21/2022  ?  9:26 AM 09/23/2019  ? 10:21 AM  ?Call back number  ?Post procedure Call Back phone  # [phone]  ?Permission to leave phone message Yes Yes  ?  ? ?Patient questions: ? ?Do you have a fever, pain , or abdominal swelling? No. ?Pain Score  0 * ? ?Have you tolerated food without any problems? Yes.   ? ?Have you been able to return to your normal activities? Yes.   ? ?Do you have any questions about your discharge instructions: ?Diet   No. ?Medications  No. ?Follow up visit  No. ? ?Do you have questions or concerns about your Care? No. ? ?Actions: ?* If pain score is 4 or above: ?No action needed, pain <4. ? ? ?

## 2022-01-25 NOTE — Telephone Encounter (Signed)
Patient contacted the office requesting a follow up appt with Dr. Kipp Brood. Patient has a history of robotic hiatal hernia repair in April 2022. Per patient she has an upper endoscopy last week and was advised to contact our office for a follow up appt. Appt scheduled for patient to see Dr. Kipp Brood. Patient acknowledges receipt of appt.  ?

## 2022-02-01 ENCOUNTER — Ambulatory Visit: Payer: PPO | Admitting: Family Medicine

## 2022-02-01 ENCOUNTER — Ambulatory Visit (INDEPENDENT_AMBULATORY_CARE_PROVIDER_SITE_OTHER): Payer: PPO | Admitting: Gastroenterology

## 2022-02-01 ENCOUNTER — Encounter: Payer: Self-pay | Admitting: Gastroenterology

## 2022-02-01 VITALS — BP 104/72 | HR 85 | Ht 64.0 in | Wt 135.0 lb

## 2022-02-01 DIAGNOSIS — R110 Nausea: Secondary | ICD-10-CM | POA: Diagnosis not present

## 2022-02-01 DIAGNOSIS — Z9889 Other specified postprocedural states: Secondary | ICD-10-CM | POA: Diagnosis not present

## 2022-02-01 DIAGNOSIS — R12 Heartburn: Secondary | ICD-10-CM

## 2022-02-01 DIAGNOSIS — K219 Gastro-esophageal reflux disease without esophagitis: Secondary | ICD-10-CM | POA: Diagnosis not present

## 2022-02-01 DIAGNOSIS — K449 Diaphragmatic hernia without obstruction or gangrene: Secondary | ICD-10-CM | POA: Diagnosis not present

## 2022-02-01 MED ORDER — ONDANSETRON 4 MG PO TBDP: 4.0000 mg | ORAL_TABLET | Freq: Four times a day (QID) | ORAL | 3 refills | Status: DC | PRN

## 2022-02-01 NOTE — Patient Instructions (Addendum)
If you are age 71 or older, your body mass index should be between 23-30. Your Body mass index is 23.17 kg/m?Marland Kitchen If this is out of the aforementioned range listed, please consider follow up with your Primary Care Provider. ? ?__________________________________________________________ ? ?The  GI providers would like to encourage you to use [facility] to communicate with providers for non-urgent requests or questions.  Due to long hold times on the telephone, sending your provider a message by [facility] may be a faster and more efficient way to get a response.  Please allow 48 business hours for a response.  Please remember that this is for non-urgent requests.  ? ?Due to recent changes in healthcare laws, you may see the results of your imaging and laboratory studies on MyChart before your provider has had a chance to review them.  We understand that in some cases there may be results that are confusing or concerning to you. Not all laboratory results come back in the same time frame and the provider may be waiting for multiple results in order to interpret others.  Please give [place] 48 hours in order for your provider to thoroughly review all the results before contacting the office for clarification of your results.  ? ?We have sent the following medications to your pharmacy for you to pick up at your convenience: Zofran ? ? ?Thank you for choosing me and [facility]. ? ?Gerrit Heck, D.O. ? ?We want to thank you for trusting [facility] with your care. All of our staff and providers value the relationships we have built with our patients, and it is an honor to care for you.  ? ?We are writing to let you know that [facility] will close on Feb 07, 2022, and we invite you to continue to see Dr. Carmell Austria and Gerrit Heck at the [facility] [place] office location. We are consolidating our serices at these [facility] practices to better  provide care. Our office staff will work with you to ensure a seamless transition.  ? ?Gerrit Heck, DO -Dr. Bryan Lemma will be movig to [facility] at [address], [place], [place] [address], effective Feb 07, 2022.  Contact [phone] to schedule an appointment with him.  ? ?Carmell Austria, MD- Dr. Lyndel Safe will be movig to [facility] at [address], [place], [place] [address], effective Feb 07, 2022.  Contact [phone] to schedule an appointment with him.  ? ?Requesting Medical Records ?If you need to request your medical records, please follow the instructions below. Your medical records are confidential, and a copy can be transferred to another provider or released to you or another person you designate only with your permission. ? ?There are several ways to request your medical records: ?Requests for medical records can be submitted through our practice.   ?You can also request your records electronically, in your MyChart account by selecting the ?Request Health Records? tab.  ?If you need additional information on how to request records, please go to [email or web address] choose Patient Information, then select Request Medical Records. ?To make an appointment or if you have any questions about your health care needs, please contact our office at [phone] and one of our staff members will be glad to assist you. ?[facility] is committed to providing exceptional care for you and our community. Thank you for allowing [place] to serve your health care needs. ?Sincerely, ? ?Windy Canny, Director [facility] ?[facility] also offers convenient virtual  care options. Sore throat? Sinus problems? Cold or flu symptoms? Get care from the comfort of home with [facility] Video Visits and e-Visits. Learn more about the non-emergency conditions treated and start your virtual visit at [email or web address] ? ?

## 2022-02-01 NOTE — Progress Notes (Signed)
? ?Chief Complaint:    Nausea, GERD ? ?GI History: [name] is a 71 y.o. female with a history of CVA (on Plavix), Right CEA (2007), left carotid stent, right subclavian stent, osteopenia, and longstanding history of GERD complicated by erosive esophagitis, initially referred to me by Dr. Havery Moros for evaluation of possible antireflux intervention with Transoral Incisionless Fundoplication (TIF) is a goal to stop or significantly reduce acid suppression therapy. ?  ?Has had reflux for multiple years, with occasional pyrosis but more significantly endorses regurgitation.  Positive nocturnal symptoms of regurgitation/choking which is increasingly bothersome.  Currently taking Protonix 40 mg twice daily and Pepcid 40 mg daily along with avoiding eating within 3 hours of bedtime (eats at 5:30), HOB elevation.  Prescribed Carafate  prn as well.  Worse with forward flexion, particularly with regurgitation.  Does endorse intermittent solid food dysphagia,pointing to anterior neck, occurring <once/month, typically cleared with fluids/repeat swallowing.  Does endorse dyspepsia.  ?  ?GERD history: ?-Index symptoms: Regurgitation, pyrosis, nocturnal choking/regurgitation, dyspepsia ?-Medications trialed: Nexium, omeprazole, Carafate prn ?-Current medications: Protonix 40 mg daily, Pepcid 40 mg qd ?-Complications: Dysphagia, 5 cm hiatal hernia ?  ?GERD evaluation: ?- CXR (11/2018): Small HH ?- EGD (02/2019, Dr. Havery Moros): 5 cm HH, Cameron ulceration, fundic gland polyps ?-Barium esophagram (03/2019): Normal esophagus, moderate sized HH, extensive reflux noted on water siphon test ?- 01/18/2021: Robotic repair of paraesophageal hernia and Dor fundoplication with mesh by Dr. Kipp Brood  ?- Esophagram (08/30/2021): Small residual/recurrent hiatal hernia, esophageal stasis with increased tertiary contractions without reflux, stricture, or ulceration ?- 09/03/2021: Evaluation at Cardiothoracic Surgery for consideration of  revision.  Patient elected for medical therapy. ?-11/02/2021: CT abdomen/pelvis (ER evaluation for LLQ pain): Moderate sized hiatal hernia, ccy, otherwise normal liver, pancreas, spleen, GI tract. ?- 01/13/2022: EGD with 2 cm x 2.5 cm hiatal hernia, intact Dor fundoplication but located cephalad to diaphragmatic hiatus ?  ?  ?Iron deficiency anemia diagnosed in 07/2019: ?-Iron 20, transferrin 43, sat 3.5%, ferritin 5.4 (none for comparison) ?-H/H 11.6/36.5 with MCV/RDW 76.3/17.7 (12.9/39.0 with 86.5/15 in 01/2019 and baseline hemoglobin ~14) ?-Started on ferrous sulfate bid 07/2019 (some constipation) ?  ?Endoscopic history: ?-Colonoscopy approximate 2008 normal per patient ?- EGD (02/2019, Dr. Havery Moros): 5 cm HH, Cameron ulceration, fundic gland polyps ?- Cologuard negative 05/2017 ?-EGD (08/2019, Dr. Bryan Lemma): 5 cm HH, no Cameron's ulcer, Hill grade 4, fundic gland polyps, 8 mm antral HP resected, mild non-H. pylori gastritis, normal duodenum with normal biopsies ?-Colonoscopy (08/2019, Dr. Bryan Lemma): 6 polyps (SSP x2, HP x4), internal hemorrhoids, sigmoid diverticulosis.  Normal TI.  Repeat 5 years ?- EGD (01/21/2022): Normal Z-line, 2 cm axial x 2.5 cm transverse with hiatal hernia, intact Dor fundoplication but located cephalad to diaphragmatic hiatus.  Otherwise normal stomach and duodenum ? ?HPI:   ? ? ?Patient is a 71 y.o. female presenting to the Gastroenterology Clinic for follow-up.  Last seen by me on 01/12/2022 with c/o nausea without emesis and intermittent abdominal pain.  Also with intermittent reflux symptoms (heartburn, regurgitation), mostly controlled with Protonix 40 mg every morning and Pepcid 40 mg every afternoon.  Subsequent underwent upper endoscopy as outlined above for evaluation of hiatal hernia noted on CT and breakthrough reflux symptoms. ? ?Was seen in the ER on 01/24/2022 with nausea, substernal chest pain/burning, SOB. ?- Normal CBC, CMP, lipase, troponin ?- EKG without ACS ? ?Today,  states she continues to have nausea.  Tends to improve with peppermint, but requesting Rx.  Otherwise, reflux largely well  controlled with current therapy. ? ?Review of systems:     No chest pain, no SOB, no fevers, no urinary sx  ? ?Past Medical History:  ?Diagnosis Date  ?? Abdominal bruit 04/2017  ? Aortic u/s showed NO ANEURISM.  ?? Allergy   ? SEASONAL  ?? Anemia   ? low iron  ?? Anxiety   ?? Carotid artery occlusion   ? hx L carotid stent  ?? Cataract   ?? COPD (chronic obstructive pulmonary disease) ([place])   ? DOE, worse since 11/2018.  Stress test neg 01/2019.  Albut x 1 inh prn helpful some.  Pulm 06/2019, PFTs not diag of COPD, DOE likely from deconditioning.  ?? Essential hypertension, benign 05/22/2014  ?? GERD (gastroesophageal reflux disease)   ? + hx of esophagitis  ?? [name] syndrome   ?? H/O hiatal hernia 02/2019  ? EGD-->moderate size->surgery recommended but pt declined.  ?? Hematochezia 2020/21  ? internal hemorrhoids  ?? History of iron deficiency anemia 07/2019  ? Dr. Bryan Lemma EGD and colonoscopy, some anorectal muscosal ulceration, o/w just mild chronic gastritis.   Also could be poor absorption due to chronic high dose acid suppression. Hb/iron normalized aft iron infus and ongoing oral iron.  ?? History of kidney stones   ? one time  ?? Mitral valve prolapse   ?? Mixed hyperlipidemia 05/22/2014  ?? OAB (overactive bladder)   ? detrol LA from urol helpful  ?? Osteopenia 06/06/2016  ? 05/2016 DEXA T-score -1.5.  06/2018 DEXA T score -1.1.  T score -1.2 06/2021  ?? Peripheral vascular disease, unspecified ([place]) 05/22/2014  ? innominate and carotid.  U/S f/u 12/2016 showed no signif change compared to 2017--vascular recommended repeat 1 yr.  Doing well as of 08/2018 cardiol f/u-->continued on ASA and Plavix. F/u 08/2019->ASA d/c'd, plavix continued.  ?? Prediabetes 2018  ? 2018 A1c 6.3%.  05/2018 A1c 6.1%. 07/2019 A1c 6.4%.  ?? TIA (transient ischemic attack) 2006  ?? Tobacco abuse, in remission    ? quit 2009  ? ? ?Patient's surgical history, family medical history, social history, medications and allergies were all reviewed in Epic  ? ? ?Current Outpatient Medications  ?Medication Sig Dispense Refill  ?? albuterol (VENTOLIN HFA) 108 (90 Base) MCG/ACT inhaler Inhale 2 puffs into the lungs every 6 (six) hours as needed for wheezing or shortness of breath. 8 g 6  ?? atorvastatin (LIPITOR) 80 MG tablet TAKE ONE TABLET BY MOUTH DAILY 90 tablet 3  ?? cholecalciferol (VITAMIN D) 1000 UNITS tablet Take 1,000 Units by mouth daily.    ?? clopidogrel (PLAVIX) 75 MG tablet TAKE ONE TABLET BY MOUTH DAILY 90 tablet 1  ?? diazepam (VALIUM) 2 MG tablet Take 1 tablet (2 mg total) by mouth 2 (two) times daily. 60 tablet 5  ?? estradiol (ESTRACE) 0.1 MG/GM vaginal cream Place 1 Applicatorful vaginally 2 (two) times a week.    ?? ezetimibe (ZETIA) 10 MG tablet TAKE ONE TABLET BY MOUTH EVERY DAY 90 tablet 3  ?? famotidine (PEPCID) 40 MG tablet Take 1 tablet (40 mg total) by mouth daily. 90 tablet 3  ?? fexofenadine (ALLEGRA) 180 MG tablet Take 180 mg by mouth daily. Take 1 tablet daily for allergies    ?? fluticasone (FLONASE) 50 MCG/ACT nasal spray Place 2 sprays into both nostrils daily. 16 g 6  ?? lisinopril (ZESTRIL) 5 MG tablet TAKE ONE TABLET BY MOUTH DAILY 90 tablet 1  ?? Magnesium 500 MG CAPS Take 500 mg by mouth daily.    ??  nitroGLYCERIN (NITROSTAT) 0.4 MG SL tablet Place 1 tablet (0.4 mg total) under the tongue every 5 (five) minutes as needed for chest pain. (Patient not taking: Reported on 01/12/2022) 25 tablet 6  ?? Omega-3 Fatty Acids (FISH OIL) 1000 MG CAPS Take 2,000 mg by mouth daily.    ?? Probiotic Product (PROBIOTIC DAILY PO) Take 1 capsule by mouth daily.    ?? tolterodine (DETROL LA) 4 MG 24 hr capsule Take 4 mg by mouth daily.    ? ?No current facility-administered medications for this visit.  ? ? ?Physical Exam:   ? ? ?Ht '5\' 4"'$  (1.626 m)   Wt 135 lb (61.2 kg)   BMI 23.17 kg/m?  ? ?GENERAL:  Pleasant  female in NAD ?PSYCH: : Cooperative, normal affect ?Musculoskeletal:  Normal muscle tone, normal strength ?NEURO: Alert and oriented x 3, no focal neurologic deficits ? ? ?IMPRESSION and PLAN:   ? ?1) GERD ?2

## 2022-02-08 ENCOUNTER — Ambulatory Visit (INDEPENDENT_AMBULATORY_CARE_PROVIDER_SITE_OTHER)
Admission: RE | Admit: 2022-02-08 | Discharge: 2022-02-08 | Disposition: A | Discharge status: Home / Self Care | Payer: PPO | Source: Ambulatory Visit | Attending: Acute Care | Admitting: Acute Care

## 2022-02-08 DIAGNOSIS — Z87891 Personal history of nicotine dependence: Secondary | ICD-10-CM | POA: Diagnosis not present

## 2022-02-09 ENCOUNTER — Ambulatory Visit (HOSPITAL_BASED_OUTPATIENT_CLINIC_OR_DEPARTMENT_OTHER)
Admission: RE | Admit: 2022-02-09 | Discharge: 2022-02-09 | Disposition: A | Discharge status: Home / Self Care | Payer: PPO | Source: Ambulatory Visit | Attending: Family Medicine | Admitting: Family Medicine

## 2022-02-09 ENCOUNTER — Ambulatory Visit (INDEPENDENT_AMBULATORY_CARE_PROVIDER_SITE_OTHER): Payer: PPO | Admitting: Family Medicine

## 2022-02-09 ENCOUNTER — Encounter: Payer: Self-pay | Admitting: Family Medicine

## 2022-02-09 VITALS — BP 103/66 | HR 72 | Temp 97.6°F | Ht 64.0 in | Wt 134.8 lb

## 2022-02-09 DIAGNOSIS — R7303 Prediabetes: Secondary | ICD-10-CM

## 2022-02-09 DIAGNOSIS — M7918 Myalgia, other site: Secondary | ICD-10-CM

## 2022-02-09 DIAGNOSIS — G8929 Other chronic pain: Secondary | ICD-10-CM | POA: Diagnosis not present

## 2022-02-09 DIAGNOSIS — M533 Sacrococcygeal disorders, not elsewhere classified: Secondary | ICD-10-CM

## 2022-02-09 DIAGNOSIS — M545 Low back pain, unspecified: Secondary | ICD-10-CM

## 2022-02-09 DIAGNOSIS — E7800 Pure hypercholesterolemia, unspecified: Secondary | ICD-10-CM | POA: Diagnosis not present

## 2022-02-09 DIAGNOSIS — F411 Generalized anxiety disorder: Secondary | ICD-10-CM | POA: Diagnosis not present

## 2022-02-09 DIAGNOSIS — Z79899 Other long term (current) drug therapy: Secondary | ICD-10-CM

## 2022-02-09 DIAGNOSIS — I10 Essential (primary) hypertension: Secondary | ICD-10-CM

## 2022-02-09 LAB — LIPID PANEL
Cholesterol: 143 mg/dL (ref 0–200)
HDL: 53.1 mg/dL (ref >39.00)
LDL Cholesterol: 60 mg/dL (ref 0–99)
NonHDL: 90.18
Total CHOL/HDL Ratio: 3
Triglycerides: 152 mg/dL — ABNORMAL HIGH (ref 0.0–149.0)
VLDL: 30.4 mg/dL (ref 0.0–40.0)

## 2022-02-09 LAB — HEMOGLOBIN A1C: Hgb A1c MFr Bld: 6 % (ref 4.6–6.5)

## 2022-02-09 MED ORDER — CLOPIDOGREL BISULFATE 75 MG PO TABS: 75.0000 mg | ORAL_TABLET | Freq: Every day | ORAL | 1 refills | Status: DC

## 2022-02-09 MED ORDER — LISINOPRIL 5 MG PO TABS: 5.0000 mg | ORAL_TABLET | Freq: Every day | ORAL | 1 refills | Status: DC

## 2022-02-09 NOTE — Progress Notes (Signed)
OFFICE VISIT ? ?02/09/2022 ? ?CC:  ?Chief Complaint  ?Patient presents with  ? Hypertension  ?  Pt is fasting  ? ? ?HPI:   ? ?Patient is a 71 y.o. female who presents for 6 mo f/u HTN, HLD, prediabetes, GAD. ?A/P as of last visit: ?"1) HTN: Excellent control on lisinopril 5 mg a day. ?Lytes/cr today. ?  ?2) HLD: Doing well on 80 mg atorvastatin and Zetia 10 mg. ?FLP and hepatic panel today. ?  ?3) Prediabetes: She has been working some on diet.  She is walking 50 minutes every day. ?Fasting glucose and Hba1c today. ?  ?4) mid back pain: Musculoskeletal strain mainly due to poor posture.  Encouraged improvement of posture, abs strengthening, low back stretching, and heat application as needed.  May continue Tylenol as needed. ?  ?5) Health maintenance exam: ?Reviewed age and gender appropriate health maintenance issues (prudent diet, regular exercise, health risks of tobacco and excessive alcohol, use of seatbelts, fire alarms in home, use of sunscreen).  Also reviewed age and gender appropriate health screening as well as vaccine recommendations. ?Vaccines: Flu->UTD.  Otherwise UTD. ?Labs: fasting HP and Hba1c. ?Cervical ca screening:  pt is s/p hysterectomy for benign dx.  pt declines any further screening paps. ?Breast ca screening: mammogram scheduled for 09/07/21. ?Colon ca screening: recall 08/2024. ?Osteoporosis screening: 06/2021 DEXA w/osteopenia, plan rpt 06/2023. ?LUNG CA screening: due for rpt LDCT-->ordered already by [facility]. ?Pt no longer smokes. ?  ?6) GAD.  Stable.  Continue diazepam 2 mg twice a day as needed." ? ?INTERIM HX: ?Doing pretty well overall ? ? ?Diazepam 2 every night. ?No home blood pressure monitoring data. ?She is not monitoring home glucoses. ?She is walking daily for exercise and still working 2 days a week. ? ?She is bothered by months of waxing and waning pain in the left low back that sometimes extends all the way up to the mid back and to the top of the back where the  trapezius feels tight.  Occasionally even involves top of left shoulder and left upper arm.  No paresthesias or weakness.  She does think she injured her back few months ago but cannot recall specifics.  She has never had PT.  Using heat, TENS unit, tylenol. ? ?ROS as above, plus--> no fevers, no CP, no SOB, no wheezing, no cough, no dizziness, no HAs, no rashes, no melena/hematochezia.  No polyuria or polydipsia.  No focal weakness, paresthesias, or tremors.  No acute vision or hearing abnormalities.  No dysuria or unusual/new urinary urgency or frequency.  No recent changes in lower legs. ?No n/v/d or abd pain.  No palpitations.   ? ? ?Past Medical History:  ?Diagnosis Date  ? Abdominal bruit 04/2017  ? Aortic u/s showed NO ANEURISM.  ? Allergy   ? SEASONAL  ? Anemia   ? low iron  ? Anxiety   ? Carotid artery occlusion   ? hx L carotid stent  ? Cataract   ? COPD (chronic obstructive pulmonary disease) ([place])   ? DOE, worse since 11/2018.  Stress test neg 01/2019.  Albut x 1 inh prn helpful some.  Pulm 06/2019, PFTs not diag of COPD, DOE likely from deconditioning.  ? Essential hypertension, benign 05/22/2014  ? GERD (gastroesophageal reflux disease)   ? + hx of esophagitis  ? [name] syndrome   ? H/O hiatal hernia 02/2019  ? EGD-->moderate size->surgery recommended but pt declined.  ? Hematochezia 2020/21  ? internal hemorrhoids  ?  History of iron deficiency anemia 07/2019  ? Dr. Bryan Lemma EGD and colonoscopy, some anorectal muscosal ulceration, o/w just mild chronic gastritis.   Also could be poor absorption due to chronic high dose acid suppression. Hb/iron normalized aft iron infus and ongoing oral iron.  ? History of kidney stones   ? one time  ? Mitral valve prolapse   ? Mixed hyperlipidemia 05/22/2014  ? OAB (overactive bladder)   ? detrol LA from urol helpful  ? Osteopenia 06/06/2016  ? 05/2016 DEXA T-score -1.5.  06/2018 DEXA T score -1.1.  T score -1.2 06/2021  ? Peripheral vascular disease, unspecified  ([place]) 05/22/2014  ? innominate and carotid.  U/S f/u 12/2016 showed no signif change compared to 2017--vascular recommended repeat 1 yr.  Doing well as of 08/2018 cardiol f/u-->continued on ASA and Plavix. F/u 08/2019->ASA d/c'd, plavix continued.  ? Prediabetes 2018  ? 2018 A1c 6.3%.  05/2018 A1c 6.1%. 07/2019 A1c 6.4%.  ? TIA (transient ischemic attack) 2006  ? Tobacco abuse, in remission   ? quit 2009  ? ? ?Past Surgical History:  ?Procedure Laterality Date  ? ABDOMINAL HYSTERECTOMY  1997  ? CARDIAC CATHETERIZATION  01/2012  ? NORMAL  ? CARDIOVASCULAR STRESS TEST  02/05/2019  ? NORMAL.  EF normal.  ? CAROTID ENDARTERECTOMY  08/21/2007  ? right - Dr. Amedeo Plenty  ? CHOLECYSTECTOMY  2000  ? COLONOSCOPY  approx 2008; 09/23/19  ? 2008 (Dr. Lenon Ahmadi pt->normal.  08/2019 adenoma, some friable areas of colon + anorectal mucosa inflam w/ ulceration, +diverticulosis. Recall 5 yrs  ? DEXA  06/06/2016  ? 05/2016 T-score -1.5.  06/2018 T score -1.1.  T score -1.2 06/2021.  ? ESOPHAGOGASTRODUODENOSCOPY  03/11/2019; 09/23/19  ? 02/2019; 5 cm hiatal hernia; GI suggested surgical repair due to intractable GER.  08/2019 mild chronic gastritis, h pylori neg, multiple benign bx's including duodenum neg for celiac changes.  ? ESOPHAGOGASTRODUODENOSCOPY N/A 01/18/2021  ? Procedure: ESOPHAGOGASTRODUODENOSCOPY (EGD);  Surgeon: [name], MD;  Location: [facility];  Service: Thoracic;  Laterality: N/A;  ? Incision and drainage of left thenar space abscess  02/23/2015  ? s/p cat bite (Dr. Amedeo Plenty)  ? innominate stent  2007  ? 12/2014 u/s showed patent stent in innominate and L common carotid.  L prox int carotid 40-59% stenosis  ? LDCT for lung ca screening  06/2019  ? NEG->rpt 1 yr  ? Left common carotid stent  2006 and 2007  ? "                    "                  "                          "                             "  ? TRANSTHORACIC ECHOCARDIOGRAM  2007  ? NORMAL  ? US CAROTID DOPPLER BILATERAL ([place] [facility])  06/2019  ? left stent  w/out stenosis.  R ICA sten 40-50% obst-stable.  07/20/21 R clear, L 40-59%, normal vert and subclav flow.  ? XI ROBOTIC ASSISTED PARAESOPHAGEAL HERNIA REPAIR N/A 01/18/2021  ? Procedure: XI ROBOTIC ASSISTED PARAESOPHAGEAL HERNIA REPAIR WITH FUNDOPLICATION WITH MESH;  Surgeon: [name], MD;  Location: [facility];  Service: Thoracic;  Laterality: N/A;  ? ? ?  Outpatient Medications Prior to Visit  ?Medication Sig Dispense Refill  ? albuterol (VENTOLIN HFA) 108 (90 Base) MCG/ACT inhaler Inhale 2 puffs into the lungs every 6 (six) hours as needed for wheezing or shortness of breath. 8 g 6  ? atorvastatin (LIPITOR) 80 MG tablet TAKE ONE TABLET BY MOUTH DAILY 90 tablet 3  ? cholecalciferol (VITAMIN D) 1000 UNITS tablet Take 1,000 Units by mouth daily.    ? diazepam (VALIUM) 2 MG tablet Take 1 tablet (2 mg total) by mouth 2 (two) times daily. 60 tablet 5  ? estradiol (ESTRACE) 0.1 MG/GM vaginal cream Place 1 Applicatorful vaginally 2 (two) times a week.    ? ezetimibe (ZETIA) 10 MG tablet TAKE ONE TABLET BY MOUTH EVERY DAY 90 tablet 3  ? famotidine (PEPCID) 40 MG tablet Take 1 tablet (40 mg total) by mouth daily. 90 tablet 3  ? fexofenadine (ALLEGRA) 180 MG tablet Take 180 mg by mouth daily. Take 1 tablet daily for allergies    ? fluticasone (FLONASE) 50 MCG/ACT nasal spray Place 2 sprays into both nostrils daily. 16 g 6  ? Magnesium 500 MG CAPS Take 500 mg by mouth daily.    ? Omega-3 Fatty Acids (FISH OIL) 1000 MG CAPS Take 2,000 mg by mouth daily.    ? Probiotic Product (PROBIOTIC DAILY PO) Take 1 capsule by mouth daily.    ? tolterodine (DETROL LA) 4 MG 24 hr capsule Take 4 mg by mouth daily.    ? nitroGLYCERIN (NITROSTAT) 0.4 MG SL tablet Place 1 tablet (0.4 mg total) under the tongue every 5 (five) minutes as needed for chest pain. (Patient not taking: Reported on 02/09/2022) 25 tablet 6  ? ondansetron (ZOFRAN-ODT) 4 MG disintegrating tablet Take 1 tablet (4 mg total) by mouth every 6 (six) hours as needed for  nausea or vomiting. (Patient not taking: Reported on 02/09/2022) 60 tablet 3  ? clopidogrel (PLAVIX) 75 MG tablet TAKE ONE TABLET BY MOUTH DAILY 90 tablet 1  ? lisinopril (ZESTRIL) 5 MG tablet TAKE ONE TABLET BY M

## 2022-02-10 ENCOUNTER — Telehealth: Payer: Self-pay | Admitting: Acute Care

## 2022-02-10 ENCOUNTER — Encounter: Payer: Self-pay | Admitting: Family Medicine

## 2022-02-10 DIAGNOSIS — Z87891 Personal history of nicotine dependence: Secondary | ICD-10-CM

## 2022-02-10 DIAGNOSIS — Z122 Encounter for screening for malignant neoplasm of respiratory organs: Secondary | ICD-10-CM

## 2022-02-10 NOTE — Telephone Encounter (Signed)
I have called the patient with the results of her low dose Ct Chest. I explained that her scan was read as a Lung RADS 1, negative study: no nodules or definitely benign nodules. Radiology recommendation is for a repeat LDCT in 12 months. From a lung cancer perspective her scan was normal.  I explained that there was a new finding that was suspicious for ILD, which is a scarring of the lungs that can be progressive. She is a patient of Dr. Valeta [name] ( She saw him in 2020), and I would like her to follow up with him to have this finding better evaluated and to perhaps order an HRCT ( ILD Protocol) . Last PFT's were in 2020 and DLCO was WNL at that time.  [name], please schedule patient with Dr. Valeta [name] or myself, and we will get  an HRCT ordered , and have the patient fill out the ILD questionnaire prior to her appointment . Please fax her results to her PCP and let them know plans for follow up. Thanks so much She needs a 12 month Low Dose CT Chest per her LR -1 reading. Please place order. Thanks so much

## 2022-02-10 NOTE — Telephone Encounter (Signed)
Results of CT and plan for follow up OV routed to PCP.  Spoke with patient and verified address to mail ILD questionnaire.  Appt with Dr. Valeta Harms on 02/25/22 at 10:15 am and patient instructed to bring ILD completed forms to visit.  Patient acknowledged understanding. Order placed for annual LDCT

## 2022-02-11 ENCOUNTER — Ambulatory Visit (INDEPENDENT_AMBULATORY_CARE_PROVIDER_SITE_OTHER): Payer: Self-pay | Admitting: Thoracic Surgery (Cardiothoracic Vascular Surgery)

## 2022-02-11 VITALS — BP 128/78 | HR 89 | Resp 20 | Ht 64.0 in | Wt 133.0 lb

## 2022-02-11 DIAGNOSIS — Z09 Encounter for follow-up examination after completed treatment for conditions other than malignant neoplasm: Secondary | ICD-10-CM

## 2022-02-11 DIAGNOSIS — K449 Diaphragmatic hernia without obstruction or gangrene: Secondary | ICD-10-CM

## 2022-02-11 NOTE — Progress Notes (Signed)
      [address]       [place] [address]             [phone]        [name] [place] Medical Record #[id number] Date of Birth: 1951-09-18  Referring: Lavena Bullion, DO Primary Care: Tammi Sou, MD Primary Cardiologist:Jayadeep Irish Lack, MD  Reason for visit:   follow-up  History of Present Illness:     [name] comes in in follow-up to discuss cross-sectional imaging findings.  On her most recent CT scan there were some findings consistent with interstitial lung disease.  From a digestive standpoint she continues to have some reflux but this is controlled with antacids.  She denies any dysphagia or odynophagia.  Physical Exam: BP 128/78   Pulse 89   Resp 20   Ht '5\' 4"'$  (1.626 m)   Wt 133 lb (60.3 kg)   SpO2 96% Comment: RA  BMI 22.83 kg/m   Alert NAD Abdomen, ND No peripheral edema   Diagnostic Studies & Laboratory data: CT chest: She does have recurrence in her hiatal hernia which is moderate in size.  There are some findings in her lower lobes bilaterally concerning for an interstitial process.    Assessment / Plan:   71 year old female status post robotic assisted paraesophageal hernia repair.  Unfortunately she has developed a recurrence, but this is being managed with medications.  She is hesitant to proceed with any other operation.  Her main concern that this appointment was the CT scan findings concerning for interstitial lung disease.  She is scheduled to see Dr. Valeta Harms again.  I explained to her that she likely will require a lung biopsy for definitive diagnosis.  If this is not consistent with interstitial lung disease and the option of revising her hiatal hernia should be strongly considered.   [name] 02/11/2022 2:56 PM

## 2022-02-25 ENCOUNTER — Encounter: Payer: Self-pay | Admitting: Pulmonary Disease

## 2022-02-25 ENCOUNTER — Ambulatory Visit: Payer: PPO | Admitting: Pulmonary Disease

## 2022-02-25 ENCOUNTER — Other Ambulatory Visit (INDEPENDENT_AMBULATORY_CARE_PROVIDER_SITE_OTHER): Payer: PPO

## 2022-02-25 VITALS — BP 112/74 | HR 86 | Temp 98.0°F | Ht 64.0 in | Wt 134.0 lb

## 2022-02-25 DIAGNOSIS — J849 Interstitial pulmonary disease, unspecified: Secondary | ICD-10-CM

## 2022-02-25 DIAGNOSIS — K449 Diaphragmatic hernia without obstruction or gangrene: Secondary | ICD-10-CM

## 2022-02-25 DIAGNOSIS — Z87891 Personal history of nicotine dependence: Secondary | ICD-10-CM | POA: Diagnosis not present

## 2022-02-25 LAB — SEDIMENTATION RATE: Sed Rate: 6 mm/hr (ref 0–30)

## 2022-02-25 NOTE — Progress Notes (Signed)
Synopsis: Referred in Feb 2022 for DOE, former patient of Dr. Tamala Julian, PCP: by Tammi Sou, MD  Subjective:   PATIENT ID: [name] GENDER: female DOB: Feb 28, 1951, MRN: [id number]  Chief Complaint  Patient presents with   Follow-up    Follow up.     This 72 year old female, former smoker, history of gastroesophageal reflux, moderate hiatal hernia, cough, stroke, emphysema on lung cancer screening CT however no obstruction on PFTs.  Patient presents today with ongoing shortness of breath.  Was seen by Dr. Tamala Julian in 2020.  Patient was not interested at the time of having hiatal hernia evaluated due to concern for an open repair.  She did meet with general surgery at one point in time.  She follows with Dr. Loletha Grayer from gastroenterology.  OV 02/25/2022: Here today for follow-up regarding shortness of breath.Last office visit we reviewed her spirometry which was reassuring no evidence of obstructive disease.  She does have evidence of his emphysema on CT imaging however.  Remain on Spiriva Respimat was given samples.  Was referred to cardiothoracic surgery for consideration of hiatal hernia repair.  Patient underwent paraesophageal hernia repair by Dr. Kipp Brood in April 2022.  Here today for follow-up after recent low-dose lung cancer screening CT that showed some basilar subpleural reticular opacities.       Past Medical History:  Diagnosis Date   Abdominal bruit 04/2017   Aortic u/s showed NO ANEURISM.   Allergy    SEASONAL   Anemia    low iron   Anxiety    Carotid artery occlusion    hx L carotid stent   Cataract    COPD (chronic obstructive pulmonary disease) (HCC)    DOE, worse since 11/2018.  Stress test neg 01/2019.  Albut x 1 inh prn helpful some.  Pulm 06/2019, PFTs not diag of COPD, DOE likely from deconditioning.   Essential hypertension, benign 05/22/2014   GERD (gastroesophageal reflux disease)    + hx of esophagitis   [name] syndrome    H/O hiatal hernia 02/2019    EGD-->moderate size->surgery recommended but pt declined.   Hematochezia 2020/21   internal hemorrhoids   History of iron deficiency anemia 07/2019   Dr. Bryan Lemma EGD and colonoscopy, some anorectal muscosal ulceration, o/w just mild chronic gastritis.   Also could be poor absorption due to chronic high dose acid suppression. Hb/iron normalized aft iron infus and ongoing oral iron.   History of kidney stones    one time   Mitral valve prolapse    Mixed hyperlipidemia 05/22/2014   OAB (overactive bladder)    detrol LA from urol helpful   Osteopenia 06/06/2016   05/2016 DEXA T-score -1.5.  06/2018 DEXA T score -1.1.  T score -1.2 06/2021   Peripheral vascular disease, unspecified ([place]) 05/22/2014   innominate and carotid.  U/S f/u 12/2016 showed no signif change compared to 2017--vascular recommended repeat 1 yr.  Doing well as of 08/2018 cardiol f/u-->continued on ASA and Plavix. F/u 08/2019->ASA d/c'd, plavix continued.   Prediabetes 2018   2018 A1c 6.3%.  05/2018 A1c 6.1%. 07/2019 A1c 6.4%.   TIA (transient ischemic attack) 2006   Tobacco abuse, in remission    quit 2009     Family History  Problem Relation Age of Onset   Heart attack Mother    CAD Mother    Heart disease Mother        Before age 64   Hypertension Mother    Hyperlipidemia Mother  Hypertension Father    Pneumonia Father    Hyperlipidemia Father    Heart murmur Sister    Osteoporosis Paternal Grandmother    CAD Paternal Grandfather    Breast cancer Neg Hx    Colon cancer Neg Hx    Esophageal cancer Neg Hx    Rectal cancer Neg Hx    Stomach cancer Neg Hx    Colon polyps Neg Hx      Past Surgical History:  Procedure Laterality Date   ABDOMINAL HYSTERECTOMY  1997   CARDIAC CATHETERIZATION  01/2012   NORMAL   CARDIOVASCULAR STRESS TEST  02/05/2019   NORMAL.  EF normal.   CAROTID ENDARTERECTOMY  08/21/2007   right - Dr. Amedeo Plenty   CHOLECYSTECTOMY  2000   COLONOSCOPY  approx 2008; 09/23/19   2008  (Dr. Lenon Ahmadi pt->normal.  08/2019 adenoma, some friable areas of colon + anorectal mucosa inflam w/ ulceration, +diverticulosis. Recall 5 yrs   DEXA  06/06/2016   05/2016 T-score -1.5.  06/2018 T score -1.1.  T score -1.2 06/2021.   ESOPHAGOGASTRODUODENOSCOPY  03/11/2019; 09/23/19   02/2019; 5 cm hiatal hernia; GI suggested surgical repair due to intractable GER.  08/2019 mild chronic gastritis, h pylori neg, multiple benign bx's including duodenum neg for celiac changes.   ESOPHAGOGASTRODUODENOSCOPY N/A 01/18/2021   Procedure: ESOPHAGOGASTRODUODENOSCOPY (EGD);  Surgeon: [name], MD;  Location: [facility];  Service: Thoracic;  Laterality: N/A;   Incision and drainage of left thenar space abscess  02/23/2015   s/p cat bite (Dr. Amedeo Plenty)   innominate stent  2007   12/2014 u/s showed patent stent in innominate and L common carotid.  L prox int carotid 40-59% stenosis   LDCT for lung ca screening  06/2019   NEG->rpt 1 yr   Left common carotid stent  2006 and 2007   "                    "                  "                          "                             "   TRANSTHORACIC ECHOCARDIOGRAM  2007   NORMAL   US CAROTID DOPPLER BILATERAL ([facility])  06/2019   left stent w/out stenosis.  R ICA sten 40-50% obst-stable.  07/20/21 R clear, L 40-59%, normal vert and subclav flow.   XI ROBOTIC ASSISTED PARAESOPHAGEAL HERNIA REPAIR N/A 01/18/2021   Procedure: XI ROBOTIC ASSISTED PARAESOPHAGEAL HERNIA REPAIR WITH FUNDOPLICATION WITH MESH;  Surgeon: [name], MD;  Location: [facility];  Service: Thoracic;  Laterality: N/A;    Social History   Socioeconomic History   Marital status: Widowed    Spouse name: Not on file   Number of children: Not on file   Years of education: Not on file   Highest education level: 12th grade  Occupational History   Not on file  Tobacco Use   Smoking status: Former    Packs/day: 1.25    Years: 38.00    Pack years: 47.50    Types: Cigarettes    Quit  date: 09/27/2007    Years since quitting: 14.4   Smokeless tobacco: Never  Vaping Use   Vaping Use: Never  used  Substance and Sexual Activity   Alcohol use: No   Drug use: No   Sexual activity: Not on file  Other Topics Concern   Not on file  Social History Narrative   Widowed as of 12/21/14 (husband died of viral illness while out of town in [place], [place]), has one daughter.   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   Tobacco: 20 pack-yr hx, quit 12-21-07.   Alcohol: rare.   No hx of alc or drug problems.   Exercise: walks 2.61mles on treadmill daily.   Social Determinants of Health   Financial Resource Strain: Low Risk    Difficulty of Paying Living Expenses: Not very hard  Food Insecurity: No Food Insecurity   Worried About RCharity fundraiserin the Last Year: Never true   Ran Out of Food in the Last Year: Never true  Transportation Needs: No Transportation Needs   Lack of Transportation (Medical): No   Lack of Transportation (Non-Medical): No  Physical Activity: Sufficiently Active   Days of Exercise per Week: 7 days   Minutes of Exercise per Session: 50 min  Stress: No Stress Concern Present   Feeling of Stress : Not at all  Social Connections: Socially Isolated   Frequency of Communication with Friends and Family: More than three times a week   Frequency of Social Gatherings with Friends and Family: Twice a week   Attends Religious Services: Never   AMarine scientistor Organizations: No   Attends CArchivistMeetings: Never   Marital Status: Widowed  IHuman resources officerViolence: Not At Risk   Fear of Current or Ex-Partner: No   Emotionally Abused: No   Physically Abused: No   Sexually Abused: No     Allergies  Allergen Reactions   Doxycycline Shortness Of Breath and Swelling    Lips swelling and short of breath   Fentanyl Nausea And Vomiting    Pt states all pain medications cause N/V for her Pt states all  pain medications cause N/V for her   Fluocinolone Nausea And Vomiting   Meperidine Nausea And Vomiting   Penicillins Hives   Breo Ellipta [Fluticasone Furoate-Vilanterol] Other (See Comments)    Lower extremity weakness, lightheadedness    Imdur [Isosorbide Nitrate] Nausea And Vomiting     Outpatient Medications Prior to Visit  Medication Sig Dispense Refill   albuterol (VENTOLIN HFA) 108 (90 Base) MCG/ACT inhaler Inhale 2 puffs into the lungs every 6 (six) hours as needed for wheezing or shortness of breath. 8 g 6   atorvastatin (LIPITOR) 80 MG tablet TAKE ONE TABLET BY MOUTH DAILY 90 tablet 3   cholecalciferol (VITAMIN D) 1000 UNITS tablet Take 1,000 Units by mouth daily.     clopidogrel (PLAVIX) 75 MG tablet Take 1 tablet (75 mg total) by mouth daily. 90 tablet 1   diazepam (VALIUM) 2 MG tablet Take 1 tablet (2 mg total) by mouth 2 (two) times daily. 60 tablet 5   estradiol (ESTRACE) 0.1 MG/GM vaginal cream Place 1 Applicatorful vaginally 2 (two) times a week.     ezetimibe (ZETIA) 10 MG tablet TAKE ONE TABLET BY MOUTH EVERY DAY 90 tablet 3   famotidine (PEPCID) 40 MG tablet Take 1 tablet (40 mg total) by mouth daily. 90 tablet 3   fexofenadine (ALLEGRA) 180 MG tablet Take 180 mg by mouth daily. Take 1 tablet daily for allergies     fluticasone (FLONASE)  50 MCG/ACT nasal spray Place 2 sprays into both nostrils daily. 16 g 6   lisinopril (ZESTRIL) 5 MG tablet Take 1 tablet (5 mg total) by mouth daily. 90 tablet 1   Magnesium 500 MG CAPS Take 500 mg by mouth daily.     Omega-3 Fatty Acids (FISH OIL) 1000 MG CAPS Take 2,000 mg by mouth daily.     ondansetron (ZOFRAN-ODT) 4 MG disintegrating tablet Take 1 tablet (4 mg total) by mouth every 6 (six) hours as needed for nausea or vomiting. 60 tablet 3   Probiotic Product (PROBIOTIC DAILY PO) Take 1 capsule by mouth daily.     tolterodine (DETROL LA) 4 MG 24 hr capsule Take 4 mg by mouth daily.     nitroGLYCERIN (NITROSTAT) 0.4 MG SL tablet  Place 1 tablet (0.4 mg total) under the tongue every 5 (five) minutes as needed for chest pain. (Patient not taking: Reported on 02/09/2022) 25 tablet 6   No facility-administered medications prior to visit.    Review of Systems  Constitutional:  Negative for chills, fever, malaise/fatigue and weight loss.  HENT:  Negative for hearing loss, sore throat and tinnitus.   Eyes:  Negative for blurred vision and double vision.  Respiratory:  Negative for cough, hemoptysis, sputum production, shortness of breath, wheezing and stridor.   Cardiovascular:  Negative for chest pain, palpitations, orthopnea, leg swelling and PND.  Gastrointestinal:  Negative for abdominal pain, constipation, diarrhea, heartburn, nausea and vomiting.  Genitourinary:  Negative for dysuria, hematuria and urgency.  Musculoskeletal:  Negative for joint pain and myalgias.  Skin:  Negative for itching and rash.  Neurological:  Negative for dizziness, tingling, weakness and headaches.  Endo/Heme/Allergies:  Negative for environmental allergies. Does not bruise/bleed easily.  Psychiatric/Behavioral:  Negative for depression. The patient is not nervous/anxious and does not have insomnia.   All other systems reviewed and are negative.   Objective:  Physical Exam Vitals reviewed.  Constitutional:      General: She is not in acute distress.    Appearance: She is well-developed.  HENT:     Head: Normocephalic and atraumatic.  Eyes:     General: No scleral icterus.    Conjunctiva/sclera: Conjunctivae normal.     Pupils: Pupils are equal, round, and reactive to light.  Neck:     Vascular: No JVD.     Trachea: No tracheal deviation.  Cardiovascular:     Rate and Rhythm: Normal rate and regular rhythm.     Heart sounds: Normal heart sounds. No murmur heard. Pulmonary:     Effort: Pulmonary effort is normal. No tachypnea, accessory muscle usage or respiratory distress.     Breath sounds: No stridor. No wheezing, rhonchi or  rales.  Abdominal:     General: There is no distension.     Palpations: Abdomen is soft.     Tenderness: There is no abdominal tenderness.  Musculoskeletal:        General: No tenderness.     Cervical back: Neck supple.  Lymphadenopathy:     Cervical: No cervical adenopathy.  Skin:    General: Skin is warm and dry.     Capillary Refill: Capillary refill takes less than 2 seconds.     Findings: No rash.  Neurological:     Mental Status: She is alert and oriented to person, place, and time.  Psychiatric:        Behavior: Behavior normal.     Vitals:   02/25/22 0959  BP: 112/74  Pulse: 86  Temp: 98 F (36.7 C)  TempSrc: Oral  SpO2: 98%  Weight: 134 lb (60.8 kg)  Height: '5\' 4"'$  (1.626 m)    98% on RA BMI Readings from Last 3 Encounters:  02/25/22 23.00 kg/m  02/11/22 22.83 kg/m  02/09/22 23.14 kg/m   Wt Readings from Last 3 Encounters:  02/25/22 134 lb (60.8 kg)  02/11/22 133 lb (60.3 kg)  02/09/22 134 lb 12.8 oz (61.1 kg)     CBC    Component Value Date/Time   WBC 6.5 01/24/2022 1127   RBC 4.87 01/24/2022 1127   HGB 14.9 01/24/2022 1127   HCT 44.7 01/24/2022 1127   PLT 251 01/24/2022 1127   MCV 91.8 01/24/2022 1127   MCH 30.6 01/24/2022 1127   MCHC 33.3 01/24/2022 1127   RDW 12.6 01/24/2022 1127   LYMPHSABS 2.5 08/03/2021 0910   MONOABS 0.5 08/03/2021 0910   EOSABS 0.1 08/03/2021 0910   BASOSABS 0.0 08/03/2021 0910    Chest Imaging:  LDCT 2020: IMPRESSION: 1. Lung-RADS 1, negative. Continue annual screening with low-dose chest CT without contrast in 12 months. 2. Moderate hiatal hernia. 3. Aortic atherosclerosis (ICD10-170.0). Coronary artery calcification. 4.  Emphysema (ICD10-J43.9).   Low-dose lung cancer screening CT: 02/08/2022 lung RADS 1 New basilar subpleural reticular densities.   Pulmonary Functions Testing Results:    Latest Ref Rng & Units 07/05/2019    2:41 PM  PFT Results  FVC-Pre L 2.43  P  FVC-Predicted Pre % 80  P   FVC-Post L 2.53  P  FVC-Predicted Post % 84  P  Pre FEV1/FVC % % 74  P  Post FEV1/FCV % % 76  P  FEV1-Pre L 1.80  P  FEV1-Predicted Pre % 78  P  FEV1-Post L 1.91  P  DLCO uncorrected ml/min/mmHg 15.39  P  DLCO UNC% % 79  P  DLVA Predicted % 84  P  TLC L 4.91  P  TLC % Predicted % 98  P  RV % Predicted % 115  P    P Preliminary result    FeNO:   Pathology:   Echocardiogram:   Heart Catheterization:     Assessment & Plan:     ICD-10-CM   1. Former smoker  Z87.891     2. ILD (interstitial lung disease) ([place])  J84.9     3. Hiatal hernia  K44.9        Discussion:  This is a 71 year old female, former smoker quit 2009 lung cancer screening CT in 2020, repeat annual screening in 2023.  Had chronic reflux with moderate hiatal hernia status post repair by Dr. Kipp Brood.  Recent lung cancer screening CT with subpleural reticular densities concerning for new ILD.  Plan: High-resolution CT scan of the chest ILD questionnaire completed.  ILD lab panel. Pulmonary function test. New consult appointment to see Dr. Chase Caller after completed.    Current Outpatient Medications:    albuterol (VENTOLIN HFA) 108 (90 Base) MCG/ACT inhaler, Inhale 2 puffs into the lungs every 6 (six) hours as needed for wheezing or shortness of breath., Disp: 8 g, Rfl: 6   atorvastatin (LIPITOR) 80 MG tablet, TAKE ONE TABLET BY MOUTH DAILY, Disp: 90 tablet, Rfl: 3   cholecalciferol (VITAMIN D) 1000 UNITS tablet, Take 1,000 Units by mouth daily., Disp: , Rfl:    clopidogrel (PLAVIX) 75 MG tablet, Take 1 tablet (75 mg total) by mouth daily., Disp: 90 tablet, Rfl: 1   diazepam (VALIUM) 2 MG tablet, Take  1 tablet (2 mg total) by mouth 2 (two) times daily., Disp: 60 tablet, Rfl: 5   estradiol (ESTRACE) 0.1 MG/GM vaginal cream, Place 1 Applicatorful vaginally 2 (two) times a week., Disp: , Rfl:    ezetimibe (ZETIA) 10 MG tablet, TAKE ONE TABLET BY MOUTH EVERY DAY, Disp: 90 tablet, Rfl: 3   famotidine  (PEPCID) 40 MG tablet, Take 1 tablet (40 mg total) by mouth daily., Disp: 90 tablet, Rfl: 3   fexofenadine (ALLEGRA) 180 MG tablet, Take 180 mg by mouth daily. Take 1 tablet daily for allergies, Disp: , Rfl:    fluticasone (FLONASE) 50 MCG/ACT nasal spray, Place 2 sprays into both nostrils daily., Disp: 16 g, Rfl: 6   lisinopril (ZESTRIL) 5 MG tablet, Take 1 tablet (5 mg total) by mouth daily., Disp: 90 tablet, Rfl: 1   Magnesium 500 MG CAPS, Take 500 mg by mouth daily., Disp: , Rfl:    Omega-3 Fatty Acids (FISH OIL) 1000 MG CAPS, Take 2,000 mg by mouth daily., Disp: , Rfl:    ondansetron (ZOFRAN-ODT) 4 MG disintegrating tablet, Take 1 tablet (4 mg total) by mouth every 6 (six) hours as needed for nausea or vomiting., Disp: 60 tablet, Rfl: 3   Probiotic Product (PROBIOTIC DAILY PO), Take 1 capsule by mouth daily., Disp: , Rfl:    tolterodine (DETROL LA) 4 MG 24 hr capsule, Take 4 mg by mouth daily., Disp: , Rfl:    nitroGLYCERIN (NITROSTAT) 0.4 MG SL tablet, Place 1 tablet (0.4 mg total) under the tongue every 5 (five) minutes as needed for chest pain. (Patient not taking: Reported on 02/09/2022), Disp: 25 tablet, Rfl: 6  I spent 30 minutes dedicated to the care of this patient on the date of this encounter to include pre-visit review of records, face-to-face time with the patient discussing conditions above, post visit ordering of testing, clinical documentation with the electronic health record, making appropriate referrals as documented, and communicating necessary findings to members of the patients care team.   Garner Nash, DO [facility] 02/25/2022 10:32 AM

## 2022-02-25 NOTE — Patient Instructions (Addendum)
Thank you for visiting Dr. Valeta Harms at [facility]. Today we recommend the following:  Orders Placed This Encounter  Procedures   CT CHEST HIGH RESOLUTION   Pulmonary Function Test   High-resolution CT scan of the chest ILD questionnaire ILD lab panel. Pulmonary function test. New consult appointment to see Dr. Chase Caller after completed.  Return if symptoms worsen or fail to improve, for w/ Dr. Chase Caller .    Please do your part to reduce the spread of COVID-19.

## 2022-03-03 LAB — ANTI-DNA ANTIBODY, DOUBLE-STRANDED: ds DNA Ab: <1 IU/mL

## 2022-03-03 LAB — ANCA SCREEN W REFLEX TITER: ANCA SCREEN: NEGATIVE

## 2022-03-03 LAB — JO-1 ANTIBODY-IGG: Jo-1 Autoabs: <1 AI

## 2022-03-03 LAB — ALDOLASE: Aldolase: 4.8 U/L (ref <=8.1)

## 2022-03-03 LAB — CK TOTAL AND CKMB (NOT AT ARMC)
CK, MB: 0.9 ng/mL (ref 0–5.0)
Relative Index: 1.3 (ref 0–4.0)
Total CK: 71 U/L (ref 29–143)

## 2022-03-03 LAB — SJOGREN'S SYNDROME ANTIBODS(SSA + SSB)
SSA (Ro) (ENA) Antibody, IgG: <1 AI
SSB (La) (ENA) Antibody, IgG: <1 AI

## 2022-03-03 LAB — CYCLIC CITRUL PEPTIDE ANTIBODY, IGG: Cyclic Citrullin Peptide Ab: <16 UNITS

## 2022-03-03 LAB — ANTI-SCLERODERMA ANTIBODY: Scleroderma (Scl-70) (ENA) Antibody, IgG: <1 AI

## 2022-03-03 LAB — EXTRA SPECIMEN

## 2022-03-03 LAB — ANTI-SMITH ANTIBODY: ENA SM Ab Ser-aCnc: <1 AI

## 2022-03-07 LAB — HYPERSENSITIVITY PNEUMONITIS
A. Pullulans Abs: NEGATIVE
A.Fumigatus #1 Abs: NEGATIVE
Micropolyspora faeni, IgG: NEGATIVE
Pigeon Serum Abs: NEGATIVE
Thermoact. Saccharii: NEGATIVE
Thermoactinomyces vulgaris, IgG: NEGATIVE

## 2022-03-08 ENCOUNTER — Ambulatory Visit (HOSPITAL_COMMUNITY)
Admission: RE | Admit: 2022-03-08 | Discharge: 2022-03-08 | Disposition: A | Discharge status: Home / Self Care | Payer: PPO | Source: Ambulatory Visit | Attending: Pulmonary Disease | Admitting: Pulmonary Disease

## 2022-03-08 DIAGNOSIS — J432 Centrilobular emphysema: Secondary | ICD-10-CM | POA: Diagnosis not present

## 2022-03-08 DIAGNOSIS — J849 Interstitial pulmonary disease, unspecified: Secondary | ICD-10-CM | POA: Diagnosis not present

## 2022-03-08 DIAGNOSIS — I700 Atherosclerosis of aorta: Secondary | ICD-10-CM | POA: Diagnosis not present

## 2022-03-08 DIAGNOSIS — K449 Diaphragmatic hernia without obstruction or gangrene: Secondary | ICD-10-CM | POA: Diagnosis not present

## 2022-03-08 NOTE — Progress Notes (Signed)
Myositis AB panel - positive. CT results pending.

## 2022-03-16 LAB — MYOMARKER 3 PLUS PROFILE (RDL)
Anti-EJ Ab (RDL): NEGATIVE
Anti-Jo-1 Ab (RDL): <20 Units (ref <20)
Anti-Ku Ab (RDL): NEGATIVE
Anti-MDA-5 Ab (CADM-140)(RDL): <20 Units (ref <20)
Anti-Mi-2 Ab (RDL): NEGATIVE
Anti-NXP-2 (P140) Ab (RDL): <20 Units (ref <20)
Anti-OJ Ab (RDL): NEGATIVE
Anti-PL-12 Ab (RDL: NEGATIVE
Anti-PL-7 Ab (RDL): NEGATIVE
Anti-PM/Scl-100 Ab (RDL): <20 Units (ref <20)
Anti-SAE1 Ab, IgG (RDL): <20 Units (ref <20)
Anti-SRP Ab (RDL): NEGATIVE
Anti-SS-A 52kD Ab, IgG (RDL): 27 Units — ABNORMAL HIGH (ref <20)
Anti-TIF-1gamma Ab (RDL): <20 Units (ref <20)
Anti-U1 RNP Ab (RDL): <20 Units (ref <20)
Anti-U2 RNP Ab (RDL): NEGATIVE
Anti-U3 RNP (Fibrillarin)(RDL): NEGATIVE

## 2022-03-30 ENCOUNTER — Ambulatory Visit (INDEPENDENT_AMBULATORY_CARE_PROVIDER_SITE_OTHER): Payer: PPO | Admitting: Pulmonary Disease

## 2022-03-30 DIAGNOSIS — J849 Interstitial pulmonary disease, unspecified: Secondary | ICD-10-CM

## 2022-03-30 LAB — PULMONARY FUNCTION TEST
DL/VA % pred: 99 %
DL/VA: 4.14 ml/min/mmHg/L
DLCO cor % pred: 91 %
DLCO cor: 17.47 ml/min/mmHg
DLCO unc % pred: 91 %
DLCO unc: 17.47 ml/min/mmHg
FEF 25-75 Post: 2 L/sec
FEF 25-75 Pre: 1.35 L/sec
FEF2575-%Change-Post: 48 %
FEF2575-%Pred-Post: 106 %
FEF2575-%Pred-Pre: 72 %
FEV1-%Change-Post: 10 %
FEV1-%Pred-Post: 87 %
FEV1-%Pred-Pre: 79 %
FEV1-Post: 1.93 L
FEV1-Pre: 1.76 L
FEV1FVC-%Change-Post: 4 %
FEV1FVC-%Pred-Pre: 98 %
FEV6-%Change-Post: 4 %
FEV6-%Pred-Post: 87 %
FEV6-%Pred-Pre: 83 %
FEV6-Post: 2.47 L
FEV6-Pre: 2.35 L
FEV6FVC-%Pred-Post: 104 %
FEV6FVC-%Pred-Pre: 104 %
FVC-%Change-Post: 4 %
FVC-%Pred-Post: 84 %
FVC-%Pred-Pre: 80 %
FVC-Post: 2.47 L
FVC-Pre: 2.35 L
Post FEV1/FVC ratio: 78 %
Post FEV6/FVC ratio: 100 %
Pre FEV1/FVC ratio: 75 %
Pre FEV6/FVC Ratio: 100 %
RV % pred: 124 %
RV: 2.7 L
TLC % pred: 101 %
TLC: 5.04 L

## 2022-03-30 NOTE — Progress Notes (Signed)
Full PFT performed today. °

## 2022-03-30 NOTE — Patient Instructions (Signed)
Full PFT performed today. °

## 2022-04-05 ENCOUNTER — Encounter: Payer: Self-pay | Admitting: Family Medicine

## 2022-04-06 ENCOUNTER — Ambulatory Visit (INDEPENDENT_AMBULATORY_CARE_PROVIDER_SITE_OTHER): Payer: PPO | Admitting: Family Medicine

## 2022-04-06 ENCOUNTER — Encounter: Payer: Self-pay | Admitting: Family Medicine

## 2022-04-06 VITALS — BP 136/80 | HR 81 | Temp 98.0°F | Ht 64.0 in | Wt 136.2 lb

## 2022-04-06 DIAGNOSIS — L600 Ingrowing nail: Secondary | ICD-10-CM

## 2022-04-06 DIAGNOSIS — L02522 Furuncle left hand: Secondary | ICD-10-CM | POA: Diagnosis not present

## 2022-04-06 MED ORDER — CLINDAMYCIN HCL 300 MG PO CAPS: 300.0000 mg | ORAL_CAPSULE | Freq: Three times a day (TID) | ORAL | 0 refills | Status: DC

## 2022-04-06 MED ORDER — MUPIROCIN 2 % EX OINT
TOPICAL_OINTMENT | CUTANEOUS | 0 refills | Status: DC
Start: 2022-04-06 — End: 2022-05-04

## 2022-04-06 NOTE — Progress Notes (Signed)
OFFICE VISIT  04/06/2022  CC:  Chief Complaint  Patient presents with   Infected toe    Started 4 weeks ago (great big L and R toe); has tried using oregano essential oil x 6d bid, soaking in epsom salt qd,applying Dr.Scholl's ointment with bandage application, used ingrown toenail kit.       Patient is a 71 y.o. female who presents for a toe concern.  HPI: About 4 weeks history of pain and swelling and ingrowing of the toenail on the left great toe.  Has been soaking in warm water regularly.  No skin breakdown or drainage.  No fever or malaise.  1 day history of a pinkish small nodule on the left thumb, mildly tender.  Yesterday it drained a little bit from the center. Grandchild diagnosed with impetigo 2 days ago.  Past Medical History:  Diagnosis Date   Abdominal bruit 04/2017   Aortic u/s showed NO ANEURISM.   Allergy    SEASONAL   Anemia    low iron   Anxiety    Carotid artery occlusion    hx L carotid stent   Cataract    COPD (chronic obstructive pulmonary disease) (HCC)    DOE, worse since 11/2018.  Stress test neg 01/2019.  Albut x 1 inh prn helpful some.  Pulm 06/2019, PFTs not diag of COPD, DOE likely from deconditioning.   Essential hypertension, benign 05/22/2014   GERD (gastroesophageal reflux disease)    + hx of esophagitis   [name] syndrome    H/O hiatal hernia 02/2019   EGD-->moderate size->surgery recommended but pt declined.   Hematochezia 2020/21   internal hemorrhoids   History of iron deficiency anemia 07/2019   Dr. Bryan Lemma EGD and colonoscopy, some anorectal muscosal ulceration, o/w just mild chronic gastritis.   Also could be poor absorption due to chronic high dose acid suppression. Hb/iron normalized aft iron infus and ongoing oral iron.   History of kidney stones    one time   Mitral valve prolapse    Mixed hyperlipidemia 05/22/2014   OAB (overactive bladder)    detrol LA from urol helpful   Osteopenia 06/06/2016   05/2016 DEXA T-score  -1.5.  06/2018 DEXA T score -1.1.  T score -1.2 06/2021   Peripheral vascular disease, unspecified ([place]) 05/22/2014   innominate and carotid.  U/S f/u 12/2016 showed no signif change compared to 2017--vascular recommended repeat 1 yr.  Doing well as of 08/2018 cardiol f/u-->continued on ASA and Plavix. F/u 08/2019->ASA d/c'd, plavix continued.   Prediabetes 2018   2018 A1c 6.3%.  05/2018 A1c 6.1%. 07/2019 A1c 6.4%.   TIA (transient ischemic attack) 2006   Tobacco abuse, in remission    quit 2009    Past Surgical History:  Procedure Laterality Date   ABDOMINAL HYSTERECTOMY  1997   CARDIAC CATHETERIZATION  01/2012   NORMAL   CARDIOVASCULAR STRESS TEST  02/05/2019   NORMAL.  EF normal.   CAROTID ENDARTERECTOMY  08/21/2007   right - Dr. Amedeo Plenty   CHOLECYSTECTOMY  2000   COLONOSCOPY  approx 2008; 09/23/19   2008 (Dr. Lenon Ahmadi pt->normal.  08/2019 adenoma, some friable areas of colon + anorectal mucosa inflam w/ ulceration, +diverticulosis. Recall 5 yrs   DEXA  06/06/2016   05/2016 T-score -1.5.  06/2018 T score -1.1.  T score -1.2 06/2021.   ESOPHAGOGASTRODUODENOSCOPY  03/11/2019; 09/23/19   02/2019; 5 cm hiatal hernia; GI suggested surgical repair due to intractable GER.  08/2019 mild chronic gastritis, h pylori neg,  multiple benign bx's including duodenum neg for celiac changes.   ESOPHAGOGASTRODUODENOSCOPY N/A 01/18/2021   Procedure: ESOPHAGOGASTRODUODENOSCOPY (EGD);  Surgeon: [name], MD;  Location: [facility];  Service: Thoracic;  Laterality: N/A;   Incision and drainage of left thenar space abscess  02/23/2015   s/p cat bite (Dr. Amedeo Plenty)   innominate stent  2007   12/2014 u/s showed patent stent in innominate and L common carotid.  L prox int carotid 40-59% stenosis   LDCT for lung ca screening  06/2019   NEG->rpt 1 yr   Left common carotid stent  2006 and 2007   "                    "                  "                          "                             "   TRANSTHORACIC  ECHOCARDIOGRAM  2007   NORMAL   US CAROTID DOPPLER BILATERAL ([place] [facility])  06/2019   left stent w/out stenosis.  R ICA sten 40-50% obst-stable.  07/20/21 R clear, L 40-59%, normal vert and subclav flow.   XI ROBOTIC ASSISTED PARAESOPHAGEAL HERNIA REPAIR N/A 01/18/2021   Procedure: XI ROBOTIC ASSISTED PARAESOPHAGEAL HERNIA REPAIR WITH FUNDOPLICATION WITH MESH;  Surgeon: [name], MD;  Location: [facility];  Service: Thoracic;  Laterality: N/A;    Outpatient Medications Prior to Visit  Medication Sig Dispense Refill   atorvastatin (LIPITOR) 80 MG tablet TAKE ONE TABLET BY MOUTH DAILY 90 tablet 3   cholecalciferol (VITAMIN D) 1000 UNITS tablet Take 1,000 Units by mouth daily.     clopidogrel (PLAVIX) 75 MG tablet Take 1 tablet (75 mg total) by mouth daily. 90 tablet 1   diazepam (VALIUM) 2 MG tablet Take 1 tablet (2 mg total) by mouth 2 (two) times daily. 60 tablet 5   estradiol (ESTRACE) 0.1 MG/GM vaginal cream Place 1 Applicatorful vaginally 2 (two) times a week.     ezetimibe (ZETIA) 10 MG tablet TAKE ONE TABLET BY MOUTH EVERY DAY 90 tablet 3   famotidine (PEPCID) 40 MG tablet Take 1 tablet (40 mg total) by mouth daily. 90 tablet 3   fexofenadine (ALLEGRA) 180 MG tablet Take 180 mg by mouth daily. Take 1 tablet daily for allergies     fluticasone (FLONASE) 50 MCG/ACT nasal spray Place 2 sprays into both nostrils daily. 16 g 6   lisinopril (ZESTRIL) 5 MG tablet Take 1 tablet (5 mg total) by mouth daily. 90 tablet 1   Magnesium 500 MG CAPS Take 500 mg by mouth daily.     Omega-3 Fatty Acids (FISH OIL) 1000 MG CAPS Take 2,000 mg by mouth daily.     Probiotic Product (PROBIOTIC DAILY PO) Take 1 capsule by mouth daily.     tolterodine (DETROL LA) 4 MG 24 hr capsule Take 4 mg by mouth daily.     albuterol (VENTOLIN HFA) 108 (90 Base) MCG/ACT inhaler Inhale 2 puffs into the lungs every 6 (six) hours as needed for wheezing or shortness of breath. (Patient not taking: Reported on 04/06/2022) 8 g 6    nitroGLYCERIN (NITROSTAT) 0.4 MG SL tablet Place 1 tablet (0.4 mg total) under the  tongue every 5 (five) minutes as needed for chest pain. (Patient not taking: Reported on 02/09/2022) 25 tablet 6   ondansetron (ZOFRAN-ODT) 4 MG disintegrating tablet Take 1 tablet (4 mg total) by mouth every 6 (six) hours as needed for nausea or vomiting. (Patient not taking: Reported on 04/06/2022) 60 tablet 3   No facility-administered medications prior to visit.    Allergies  Allergen Reactions   Doxycycline Shortness Of Breath and Swelling    Lips swelling and short of breath   Fentanyl Nausea And Vomiting    Pt states all pain medications cause N/V for her Pt states all pain medications cause N/V for her   Fluocinolone Nausea And Vomiting   Meperidine Nausea And Vomiting   Penicillins Hives   Breo Ellipta [Fluticasone Furoate-Vilanterol] Other (See Comments)    Lower extremity weakness, lightheadedness    Imdur [Isosorbide Nitrate] Nausea And Vomiting    ROS As per HPI  PE:    04/06/2022   10:40 AM 02/25/2022    9:59 AM 02/11/2022   12:47 PM  Vitals with BMI  Height _0  _1  _2   Weight 136 lbs 3 oz 134 lbs 133 lbs  BMI 23.37 10.31 59.45  Systolic 859 292 446  Diastolic 80 74 78  Pulse 81 86 89     Physical Exam  Gen: Alert, well appearing.  Patient is oriented to person, place, time, and situation. AFFECT: pleasant, lucid thought and speech. Left great toe with mycotic changes.  Mild periungual erythema and edema noted laterally.  Mild to moderate tenderness over this area.  No drainage around the nail. Left thumb with 1 cm round subcutaneous nodule with erythema and mild tenderness. Closed central drainage tract present.  LABS:  Last CBC Lab Results  Component Value Date   WBC 6.5 01/24/2022   HGB 14.9 01/24/2022   HCT 44.7 01/24/2022   MCV 91.8 01/24/2022   MCH 30.6 01/24/2022   RDW 12.6 01/24/2022   PLT 251 28/63/8177   Last metabolic panel Lab Results   Component Value Date   GLUCOSE 104 (H) 01/24/2022   NA 140 01/24/2022   K 3.9 01/24/2022   CL 105 01/24/2022   CO2 26 01/24/2022   BUN 13 01/24/2022   CREATININE 0.73 01/24/2022   GFRNONAA >60 01/24/2022   CALCIUM 10.0 01/24/2022   PROT 7.0 01/24/2022   ALBUMIN 4.4 01/24/2022   BILITOT 1.6 (H) 01/24/2022   ALKPHOS 83 01/24/2022   AST 22 01/24/2022   ALT 22 01/24/2022   ANIONGAP 9 01/24/2022   Last hemoglobin A1c Lab Results  Component Value Date   HGBA1C 6.0 02/09/2022   IMPRESSION AND PLAN:  #1 ingrown toenail on left great toe. Discussed removal of nail versus ongoing conservative management with soaks.  Additionally discussed a 1 week course of antibiotic. She chose to continue with soaks and will do clindamycin 300 3 times daily x7 days.  #2 small furuncle, suspect staph or strep. Bactroban ointment 3 times daily for 7 to 10 days Soak daily and gently milk to express fluid once it opens up.  An After Visit Summary was printed and given to the patient.  FOLLOW UP: Return if symptoms worsen or fail to improve.  Signed:  Crissie Sickles, MD           04/06/2022

## 2022-04-07 ENCOUNTER — Encounter: Payer: Self-pay | Admitting: Family Medicine

## 2022-04-07 MED ORDER — SULFAMETHOXAZOLE-TRIMETHOPRIM 800-160 MG PO TABS
1.0000 | ORAL_TABLET | Freq: Two times a day (BID) | ORAL | 0 refills | Status: DC
Start: 2022-04-07 — End: 2022-05-13

## 2022-04-07 NOTE — Telephone Encounter (Signed)
Stop clindamycin. Pls eRx bactrim DS 1 bid x 7d, #14, no RF

## 2022-04-07 NOTE — Telephone Encounter (Signed)
Spoke with pt and advised rx change, pt voiced understanding. Rx sent in

## 2022-04-12 ENCOUNTER — Encounter: Payer: Self-pay | Admitting: Internal Medicine

## 2022-04-12 ENCOUNTER — Ambulatory Visit: Payer: PPO | Admitting: Internal Medicine

## 2022-04-12 VITALS — BP 118/62 | HR 94 | Temp 98.5°F | Ht 64.0 in | Wt 135.6 lb

## 2022-04-12 DIAGNOSIS — J029 Acute pharyngitis, unspecified: Secondary | ICD-10-CM | POA: Diagnosis not present

## 2022-04-12 DIAGNOSIS — I7300 Raynaud's syndrome without gangrene: Secondary | ICD-10-CM

## 2022-04-12 DIAGNOSIS — R059 Cough, unspecified: Secondary | ICD-10-CM

## 2022-04-12 DIAGNOSIS — K449 Diaphragmatic hernia without obstruction or gangrene: Secondary | ICD-10-CM

## 2022-04-12 DIAGNOSIS — R1310 Dysphagia, unspecified: Secondary | ICD-10-CM

## 2022-04-12 DIAGNOSIS — Z87891 Personal history of nicotine dependence: Secondary | ICD-10-CM | POA: Diagnosis not present

## 2022-04-12 DIAGNOSIS — R0609 Other forms of dyspnea: Secondary | ICD-10-CM | POA: Diagnosis not present

## 2022-04-12 DIAGNOSIS — K219 Gastro-esophageal reflux disease without esophagitis: Secondary | ICD-10-CM | POA: Diagnosis not present

## 2022-04-12 MED ORDER — SPIRIVA RESPIMAT 1.25 MCG/ACT IN AERS: 2.0000 | INHALATION_SPRAY | Freq: Every day | RESPIRATORY_TRACT | 3 refills | Status: DC

## 2022-04-12 NOTE — Progress Notes (Signed)
IOV Sept 2020 with Dr Ina Homes Patient with history of PVD, carotid artery disease, seasonal allergies, prediabetes, hiatal hernia, previous smoking presenting for evaluation of DOE. Started around March 2020. Given Rx for abx/inhaler, some improvement but not much. Had some associated chest tightness prompting a cardiac evaluation.  She underwent a nuclear stress test which was normal. Regarding her smoking, she smoked from age 71-2009, about 1.5 packs a day.  This equates to about a 45-pack-year smoking history. Current mMRC 1.  She uses albuterol inhaler 1 puff instead of 2, she feels this helps somewhat.  She is afraid to use more than 1 puff as it makes her heart race. She has no family history of lung disease. She has a fairly symptomatic hiatal hernia that requires her to eat very small meals.  She has been told she will eventually need this surgically repaired.  She is afraid to do this as her husband and father both died in the operating room.  She is on a PPI. She has some mild seasonal allergies and congestion.  Sputum production is clear.  Again her exertional dyspnea is not really associated with a cough, more of a tightness. I note that in 2009 when she was being worked up for her carotid artery disease she had a CTA of the chest which demonstrated moderate emphysema.  We have no PFTs on file.  Other than the ER visit in March 2020 she has never been hospitalized for her breathing.    Feb 2022 with Icard: This 71 year old female, former smoker, history of gastroesophageal reflux, moderate hiatal hernia, cough, stroke, emphysema on lung cancer screening CT however no obstruction on PFTs.  Patient presents today with ongoing shortness of breath.  Was seen by Dr. Tamala Julian in 2020.  Patient was not interested at the time of having hiatal hernia evaluated due to concern for an open repair.  She did meet with general surgery at one point in time.  She follows with Dr. Loletha Grayer from  gastroenterology.  OV 02/25/2022: Dr Valeta Harms Here today for follow-up regarding shortness of breath.Last office visit we reviewed her spirometry which was reassuring no evidence of obstructive disease.  She does have evidence of his emphysema on CT imaging however.  Remain on Spiriva Respimat was given samples.  Was referred to cardiothoracic surgery for consideration of hiatal hernia repair.  Patient underwent paraesophageal hernia repair by Dr. Kipp Brood in April 2022.  Here today for follow-up after recent low-dose lung cancer screening CT that showed some basilar subpleural reticular opacities.  OV 04/12/2022 -evaluation for ILD by Dr. Chase Caller at the ILD center.  Presents with her daughter [name].  History is from the patient and review of the records. Subjective:  Patient ID: [name], female , DOB: [birth year] , age 29 y.o. , MRN: [id number] , ADDRESS: [place] [address] [place] [id number] PCP Tammi Sou, MD Patient Care Team: Tammi Sou, MD as PCP - General (Family Medicine) Jettie Booze, MD as PCP - Cardiology (Cardiology) Serafina Mitchell, MD as Consulting Physician (Vascular Surgery) Roseanne Kaufman, MD as Consulting Physician (Orthopedic Surgery) Lavonna Monarch, MD as Consulting Physician (Dermatology) Armbruster, Carlota Raspberry, MD as Consulting Physician (Gastroenterology) Clovis Riley, MD as Consulting Physician (General Surgery) Candee Furbish, MD as Consulting Physician (Pulmonary Disease) Lavena Bullion, DO as Consulting Physician (Gastroenterology) Janith Lima, MD as Consulting Physician (Urology) Brand Males, MD as Consulting Physician (Pulmonary Disease)  This Provider for this visit: Treatment  Team:  Attending Provider: Brand Males, MD    04/12/2022 -   Chief Complaint  Patient presents with   New Patient (Initial Visit)    New pt for ILD. Pt states when she walks up stairs she gets SOB. And when she is outside she notices  it to.      HPI [name] 71 y.o. -female who in 2019/2020 started developing insidious onset of shortness of breath.  She subsequently saw Dr. Ina Homes.  This was in the pulmonary clinic.  Was diagnosed to have significant amount of hiatal hernia.  She initially did not have surgery but then by April 2022 did have surgery for this by Dr. Kipp Brood.  According to the daughter and the patient along with the surgery the shortness of breath improved.  Also she had some sore throat and also dysphagia all the symptoms improved.  However since the spring 2023 starting around March 2023 she symptoms started coming back and apparently the surgery has a partial failure and she needs another surgery again.  During this time she also saw Dr. June Leap in May 2023.  She had low-dose CT scan of the chest because of her smoking history.  This suggested ILD although there was no lung cancer.  He then got autoimmune work-up and high-resolution CT chest and then refer the patient here.  She tells me that she still has ongoing shortness of breath with exertion for 7 steps along with groceries.  This is stable since last year but definitely worse after the surgery has failed.  She also has some sore throat that is there all the time.  She feels it is associated with her acid reflux.  She is taking up to 4 Pepcid a day.  She feels acid reflux is worse in the last several months.  She also has dysphagia.  Otherwise she feels healthy.  She is considering surgery again.  She does have a mild cough and she is noted to be on ACE inhibitor.  She had pulmonary function test and this looks fairly normal.  She had high-resolution CT chest and there is no ILD.  I personally reviewed this with her and visualized it.  She had extensive autoimmune panel and her SSA is positive.  In talking to her she and the daughter report 20-year history of definite Raynaud's.  She stopped taking cold drinks because her stone she touches a  cold can she will have Raynaud's.  The phenomena repeat for cold weather and also cold water.  She has never seen a rheumatologist.  They are open to seeing a rheumatologist.  She denies any tightening of the skin although she is noted to have hiatal hernia with acid reflux.  Latest Reference Range & Units 02/25/22 11:24 02/25/22 11:26  Anti-PL-7 Ab (RDL) Negative   Negative  Anti-PL-12 Ab (RDL) Negative   Negative  Anti-EJ Ab (RDL) Negative   Negative  Anti-OJ Ab (RDL) Negative   Negative  Anti-SRP Ab (RDL) Negative   Negative  Anti-Mi-2 Ab (RDL) Negative   Negative  Anti-TIF-1gamma Ab (RDL) <20 Units  <20  Anti-MDA-5 Ab (CADM-140)(RDL) <20 Units  <20  Anti-NXP-2 (P140) Ab (RDL) <20 Units  <20  Anti-SAE1 Ab, IgG (RDL) <20 Units  <20  Anti-PM/Scl-100 Ab (RDL) <20 Units  <20  Anti-Ku Ab (RDL) Negative   Negative  Anti-SS-A 52kD Ab, IgG (RDL) <20 Units  27 (H)  Anti-U1 RNP Ab (RDL) <20 Units  <20  Anti-U2 RNP Ab (RDL)  Negative   Negative  Anti-U3 RNP (Fibrillarin)(RDL) Negative   Negative  ENA SM Ab Ser-aCnc <1.0 NEG AI <1.0 NEG   SSA (Ro) (ENA) Antibody, IgG <1.0 NEG AI <1.0 NEG   SSB (La) (ENA) Antibody, IgG <1.0 NEG AI <1.0 NEG   Scleroderma (Scl-70) (ENA) Antibody, IgG <1.0 NEG AI <1.0 NEG   (H): Data is abnormally high  HRCT 03/08/22 personally visualized.  Narrative & Impression  CLINICAL DATA:  Interstitial lung disease   EXAM: CT CHEST WITHOUT CONTRAST   TECHNIQUE: Multidetector CT imaging of the chest was performed following the standard protocol without intravenous contrast. High resolution imaging of the lungs, as well as inspiratory and expiratory imaging, was performed.   RADIATION DOSE REDUCTION: This exam was performed according to the departmental dose-optimization program which includes automated exposure control, adjustment of the mA and/or kV according to patient size and/or use of iterative reconstruction technique.   COMPARISON:  Lung cancer screening CT  dated Feb 08, 2022; lung cancer screening CT dated July 01, 2019   FINDINGS: Cardiovascular: Normal heart size. No pericardial effusion. Severe three-vessel coronary artery calcifications. Mitral annular calcifications. Atherosclerotic disease of the thoracic aorta.   Mediastinum/Nodes: Moderate hiatal hernia. Thyroid is unremarkable. No pathologically enlarged lymph nodes seen in the chest.   Lungs/Pleura: Central airways are patent. No evidence of air trapping. Moderate centrilobular emphysema. No consolidation, pleural effusion or pneumothorax. Mild basilar subpleural reticular opacities are less prominent with prone imaging and likely due to atelectasis. Small focus of persistent linear opacities involving the right lower lobe is likely due to focal scarring.   Upper Abdomen: Cholecystectomy clips. Unchanged simple appearing cyst of the left kidney. No acute abnormality.   Musculoskeletal: No chest wall mass or suspicious bone lesions identified.   IMPRESSION: 1. No evidence of fibrotic interstitial lung disease. 2. Moderate hiatal hernia. 3. Aortic Atherosclerosis (ICD10-I70.0) and Emphysema (ICD10-J43.9).     Electronically Signed   By: Yetta Glassman M.D.   On: 03/10/2022 08:46    No results found.    PFT     Latest Ref Rng & Units 03/30/2022   10:24 AM 07/05/2019    2:41 PM  PFT Results  FVC-Pre L 2.35  P 2.43  P  FVC-Predicted Pre % 80  P 80  P  FVC-Post L 2.47  P 2.53  P  FVC-Predicted Post % 84  P 84  P  Pre FEV1/FVC % % 75  P 74  P  Post FEV1/FCV % % 78  P 76  P  FEV1-Pre L 1.76  P 1.80  P  FEV1-Predicted Pre % 79  P 78  P  FEV1-Post L 1.93  P 1.91  P  DLCO uncorrected ml/min/mmHg 17.47  P 15.39  P  DLCO UNC% % 91  P 79  P  DLCO corrected ml/min/mmHg 17.47  P   DLCO COR %Predicted % 91  P   DLVA Predicted % 99  P 84  P  TLC L 5.04  P 4.91  P  TLC % Predicted % 101  P 98  P  RV % Predicted % 124  P 115  P    P Preliminary result        has a past medical history of Abdominal bruit (04/2017), Anemia, Anxiety, Carotid artery occlusion, Cataract, COPD (chronic obstructive pulmonary disease) ([place]), Essential hypertension, benign (05/22/2014), GERD (gastroesophageal reflux disease), [name] syndrome, H/O hiatal hernia (02/2019), Hematochezia (2020/21), History of iron deficiency anemia (07/2019), History of  kidney stones, ILD (interstitial lung disease) ([place]), Mitral valve prolapse, Mixed hyperlipidemia (05/22/2014), OAB (overactive bladder), Osteopenia (06/06/2016), Peripheral vascular disease, unspecified ([place]) (05/22/2014), Prediabetes (2018), Raynaud disease, Seasonal allergies, TIA (transient ischemic attack) (2006), and Tobacco abuse, in remission.   reports that she quit smoking about 14 years ago. Her smoking use included cigarettes. She has a 47.50 pack-year smoking history. She has never used smokeless tobacco.  Past Surgical History:  Procedure Laterality Date   ABDOMINAL HYSTERECTOMY  1997   CARDIAC CATHETERIZATION  01/2012   NORMAL   CARDIOVASCULAR STRESS TEST  02/05/2019   NORMAL.  EF normal.   CAROTID ENDARTERECTOMY  08/21/2007   right - Dr. Amedeo Plenty   CHOLECYSTECTOMY  2000   COLONOSCOPY  approx 2008; 09/23/19   2008 (Dr. Lenon Ahmadi pt->normal.  08/2019 adenoma, some friable areas of colon + anorectal mucosa inflam w/ ulceration, +diverticulosis. Recall 5 yrs   DEXA  06/06/2016   05/2016 T-score -1.5.  06/2018 T score -1.1.  T score -1.2 06/2021.   ESOPHAGOGASTRODUODENOSCOPY  03/11/2019; 09/23/19   02/2019; 5 cm hiatal hernia; GI suggested surgical repair due to intractable GER.  08/2019 mild chronic gastritis, h pylori neg, multiple benign bx's including duodenum neg for celiac changes.   ESOPHAGOGASTRODUODENOSCOPY N/A 01/18/2021   Procedure: ESOPHAGOGASTRODUODENOSCOPY (EGD);  Surgeon: [name], MD;  Location: [facility];  Service: Thoracic;  Laterality: N/A;   Incision and drainage of left thenar space abscess   02/23/2015   s/p cat bite (Dr. Amedeo Plenty)   innominate stent  2007   12/2014 u/s showed patent stent in innominate and L common carotid.  L prox int carotid 40-59% stenosis   LDCT for lung ca screening  06/2019   NEG->rpt 1 yr   Left common carotid stent  2006 and 2007   "                    "                  "                          "                             "   TRANSTHORACIC ECHOCARDIOGRAM  2007   NORMAL   US CAROTID DOPPLER BILATERAL ([facility])  06/2019   left stent w/out stenosis.  R ICA sten 40-50% obst-stable.  07/20/21 R clear, L 40-59%, normal vert and subclav flow.   XI ROBOTIC ASSISTED PARAESOPHAGEAL HERNIA REPAIR N/A 01/18/2021   Procedure: XI ROBOTIC ASSISTED PARAESOPHAGEAL HERNIA REPAIR WITH FUNDOPLICATION WITH MESH;  Surgeon: [name], MD;  Location: [facility];  Service: Thoracic;  Laterality: N/A;    Allergies  Allergen Reactions   Doxycycline Shortness Of Breath and Swelling    Lips swelling and short of breath   Fentanyl Nausea And Vomiting    Pt states all pain medications cause N/V for her Pt states all pain medications cause N/V for her   Fluocinolone Nausea And Vomiting   Meperidine Nausea And Vomiting   Penicillins Hives   Breo Ellipta [Fluticasone Furoate-Vilanterol] Other (See Comments)    Lower extremity weakness, lightheadedness    Imdur [Isosorbide Nitrate] Nausea And Vomiting    Immunization History  Administered Date(s) Administered   Fluad Quad(high Dose 65+) 06/11/2019   Influenza, High Dose Seasonal PF 05/23/2018,  07/21/2020   Influenza-Unspecified 07/01/2021   Moderna Covid-19 Vaccine Bivalent Booster 62yr & up 07/22/2021   Moderna Sars-Covid-2 Vaccination 10/23/2019, 11/20/2019, 07/21/2020   Pneumococcal Conjugate-13 05/23/2016   Pneumococcal Polysaccharide-23 05/24/2017   Tdap 02/22/2015   Zoster Recombinat (Shingrix) 07/11/2018, 10/18/2018    Family History  Problem Relation Age of Onset   Heart attack Mother    CAD Mother     Heart disease Mother        Before age 71  Hypertension Mother    Hyperlipidemia Mother    Hypertension Father    Pneumonia Father    Hyperlipidemia Father    Heart murmur Sister    Osteoporosis Paternal Grandmother    CAD Paternal Grandfather    Breast cancer Neg Hx    Colon cancer Neg Hx    Esophageal cancer Neg Hx    Rectal cancer Neg Hx    Stomach cancer Neg Hx    Colon polyps Neg Hx      Current Outpatient Medications:    albuterol (VENTOLIN HFA) 108 (90 Base) MCG/ACT inhaler, Inhale 2 puffs into the lungs every 6 (six) hours as needed for wheezing or shortness of breath., Disp: 8 g, Rfl: 6   atorvastatin (LIPITOR) 80 MG tablet, TAKE ONE TABLET BY MOUTH DAILY, Disp: 90 tablet, Rfl: 3   cholecalciferol (VITAMIN D) 1000 UNITS tablet, Take 1,000 Units by mouth daily., Disp: , Rfl:    clopidogrel (PLAVIX) 75 MG tablet, Take 1 tablet (75 mg total) by mouth daily., Disp: 90 tablet, Rfl: 1   diazepam (VALIUM) 2 MG tablet, Take 1 tablet (2 mg total) by mouth 2 (two) times daily., Disp: 60 tablet, Rfl: 5   estradiol (ESTRACE) 0.1 MG/GM vaginal cream, Place 1 Applicatorful vaginally 2 (two) times a week., Disp: , Rfl:    ezetimibe (ZETIA) 10 MG tablet, TAKE ONE TABLET BY MOUTH EVERY DAY, Disp: 90 tablet, Rfl: 3   famotidine (PEPCID) 40 MG tablet, Take 1 tablet (40 mg total) by mouth daily., Disp: 90 tablet, Rfl: 3   fexofenadine (ALLEGRA) 180 MG tablet, Take 180 mg by mouth daily. Take 1 tablet daily for allergies, Disp: , Rfl:    fluticasone (FLONASE) 50 MCG/ACT nasal spray, Place 2 sprays into both nostrils daily., Disp: 16 g, Rfl: 6   lisinopril (ZESTRIL) 5 MG tablet, Take 1 tablet (5 mg total) by mouth daily., Disp: 90 tablet, Rfl: 1   Magnesium 500 MG CAPS, Take 500 mg by mouth daily., Disp: , Rfl:    mupirocin ointment (BACTROBAN) 2 %, Apply tid to affected area x 10d, Disp: 15 g, Rfl: 0   nitroGLYCERIN (NITROSTAT) 0.4 MG SL tablet, Place 1 tablet (0.4 mg total) under the tongue  every 5 (five) minutes as needed for chest pain., Disp: 25 tablet, Rfl: 6   ondansetron (ZOFRAN-ODT) 4 MG disintegrating tablet, Take 1 tablet (4 mg total) by mouth every 6 (six) hours as needed for nausea or vomiting., Disp: 60 tablet, Rfl: 3   Probiotic Product (PROBIOTIC DAILY PO), Take 1 capsule by mouth daily., Disp: , Rfl:    sulfamethoxazole-trimethoprim (BACTRIM DS) 800-160 MG tablet, Take 1 tablet by mouth 2 (two) times daily., Disp: 14 tablet, Rfl: 0   tolterodine (DETROL LA) 4 MG 24 hr capsule, Take 4 mg by mouth daily., Disp: , Rfl:       Objective:   Vitals:   04/12/22 1054  BP: 118/62  Pulse: 94  Temp: 98.5 F (36.9 C)  TempSrc: Oral  SpO2: 96%  Weight: 135 lb 9.6 oz (61.5 kg)  Height: '5\' 4"'$  (1.626 m)    Estimated body mass index is 23.28 kg/m as calculated from the following:   Height as of this encounter: '5\' 4"'$  (1.626 m).   Weight as of this encounter: 135 lb 9.6 oz (61.5 kg).  '@WEIGHTCHANGE'$ @  [employer]   04/12/22 1054  Weight: 135 lb 9.6 oz (61.5 kg)     Physical Exam  General: No distress. Looks well Neuro: Alert and Oriented x 3. GCS 15. Speech normal Psych: Pleasant Resp:  Barrel Chest - no.  Wheeze - no, Crackles - no, No overt respiratory distress CVS: Normal heart sounds. Murmurs - no Ext: Stigmata of Connective Tissue Disease - no HEENT: Normal upper airway. PEERL +. No post nasal drip        Assessment:       ICD-10-CM   1. DOE (dyspnea on exertion)  R06.09     2. Hiatal hernia with GERD  K44.9    K21.9     3. Sore throat  J02.9     4. Former smoker  Z87.891     5. Cough, unspecified type  R05.9     6. Dysphagia, unspecified type  R13.10     7. Raynaud's phenomenon without gangrene  I73.00          Plan:     Patient Instructions  Former smoker Emphysema on CT Hiatal hernia with GERD DOE (dyspnea on exertion) Sore throat Dysphagia, unspecified type Cough, unspecified type  -Your CT scan suggest mild  emphysema  - Previously on Spiriva but currently not do not know why   -There is no evidence of pulmonary fibrosis or interstitial lung disease on the high-resolution CT chest  -However prior smoking ongoing acid reflux and Raynaud's phenomena put you at future risk for interstitial lung disease although this risk is low  -Your symptoms of shortness of breath  - are likely from a combination of the hiatal hernia and emphysema  -You have symptoms of sore throat  -Is likely from the acid reflux in the hiatal hernia  -Although we need to rule out any vocal cord issues  -Your symptoms of mild cough  -Is likely from emphysema, hiatal hernia and possibly the lisinopril  -Your symptoms of acid reflux  -Is from hiatal hernia made worse by previous fish oil and I am glad you stop fish oil  Plan  -No follow-up with interstitial lung disease center with Dr. Chase Caller -Do not ever take fish oil again in the presence of hiatal hernia and acid reflux -Start Spiriva Respimat low-dose 2 puffs once daily and continue albuterol as needed -Refer to ENT for sore throat -Definitely consider redo surgery for hiatal hernia with Dr. Kipp Brood -Continue CT low-dose monitoring for lung cancer screening with Dr. 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 Leory Plowman Icard   ( Level 05 visit: Estb 40-54 min  visit type: on-site physical face to visit  in total care time and counseling or/and coordination of care by this undersigned MD - Dr Brand Males. This includes one or more of the following on this same day 04/12/2022: pre-charting, chart review, note writing, documentation discussion of test results, diagnostic or treatment recommendations, prognosis, risks and benefits of management options, instructions, education, compliance or  risk-factor reduction. It excludes time spent by the [place] or office staff in the care of the patient. Actual time 32 min) 44 SIGNATURE    Dr. Brand Males, M.D., F.C.C.P,  Pulmonary and Critical Care Medicine Staff Physician, [place] Director - Interstitial Lung Disease  Program  [facility] [place], [place], [address]  Pager: [phone], If no answer or between  15:00h - 7:00h: call [phone] Telephone: [phone]  11:32 AM 04/12/2022

## 2022-04-12 NOTE — Patient Instructions (Addendum)
Former smoker Emphysema on CT Hiatal hernia with GERD DOE (dyspnea on exertion) Sore throat Dysphagia, unspecified type Cough, unspecified type  -Your CT scan suggest mild emphysema  - Previously on Spiriva but currently not do not know why   -There is no evidence of pulmonary fibrosis or interstitial lung disease on the high-resolution CT chest  -However prior smoking ongoing acid reflux and Raynaud's phenomena put you at future risk for interstitial lung disease although this risk is low  -Your symptoms of shortness of breath  - are likely from a combination of the hiatal hernia and emphysema  -You have symptoms of sore throat  -Is likely from the acid reflux in the hiatal hernia  -Although we need to rule out any vocal cord issues  -Your symptoms of mild cough  -Is likely from emphysema, hiatal hernia and possibly the lisinopril  -Your symptoms of acid reflux  -Is from hiatal hernia made worse by previous fish oil and I am glad you stop fish oil  Plan  -No follow-up with interstitial lung disease center with Dr. Chase Caller -Do not ever take fish oil again in the presence of hiatal hernia and acid reflux -Start Spiriva Respimat low-dose 2 puffs once daily and continue albuterol as needed -Refer to ENT for sore throat -Definitely consider redo surgery for hiatal hernia with Dr. Kipp Brood -Continue CT low-dose monitoring for lung cancer screening with Dr. Valeta Harms   Raynaud's phenomenon without gangrene Positive SSA antibdody   -No evidence of interstitial lung disease but need to rule out autoimmune disease esp in context of Raynaud ad hiatal hernia  Plan - Refer rheumatology    [place] -2-3 months with Dr. June Leap

## 2022-04-14 ENCOUNTER — Other Ambulatory Visit: Payer: Self-pay | Admitting: Family Medicine

## 2022-04-15 ENCOUNTER — Encounter: Payer: Self-pay | Admitting: Internal Medicine

## 2022-04-15 NOTE — Telephone Encounter (Signed)
Received a message from patient stating [name] can not see Dr. Estanislado Pandy until 10/21/22. [name] wanted to know if it was ok for her to wait that long to be seen.   MR, can you please advise? Thanks.

## 2022-04-18 NOTE — Telephone Encounter (Signed)
If Dr Vernelle Emerald (same practice) can see her sooner great. She can also try Drs Kennis Carina at [facility] but if the wait time with all of them is this long, then ok to wait

## 2022-04-21 NOTE — Progress Notes (Signed)
      [address]       [place] [address]             [phone]        [facility] Medical Record #[id number] Date of Birth: 1951-01-28  Referring: Garner Nash, DO Primary Care: Tammi Sou, MD Primary Cardiologist:Jayadeep Irish Lack, MD  Reason for visit:   follow-up  History of Present Illness:     71 year old female presents in follow-up to discuss results of her cross-sectional imaging.  She is status post paraesophageal hernia repair several months ago but unfortunately developed a recurrence with a moderate-sized hernia.  Her biggest complaint has been respiratory in nature which is likely due to silent aspiration from her hiatal hernia.  Physical Exam: BP 130/77   Pulse 90   Resp 20   Ht '5\' 4"'$  (1.626 m)   Wt 135 lb (61.2 kg)   SpO2 97%   BMI 23.17 kg/m   Alert NAD Abdomen, ND No peripheral edema   Diagnostic Studies & Laboratory data: CT Chest: FINDINGS: Cardiovascular: Normal heart size. No pericardial effusion. Severe three-vessel coronary artery calcifications. Mitral annular calcifications. Atherosclerotic disease of the thoracic aorta.   Mediastinum/Nodes: Moderate hiatal hernia. Thyroid is unremarkable. No pathologically enlarged lymph nodes seen in the chest.   Lungs/Pleura: Central airways are patent. No evidence of air trapping. Moderate centrilobular emphysema. No consolidation, pleural effusion or pneumothorax. Mild basilar subpleural reticular opacities are less prominent with prone imaging and likely due to atelectasis. Small focus of persistent linear opacities involving the right lower lobe is likely due to focal scarring.   Upper Abdomen: Cholecystectomy clips. Unchanged simple appearing cyst of the left kidney. No acute abnormality.   Musculoskeletal: No chest wall mass or suspicious bone lesions identified.    Assessment / Plan:   71yo female with recurrent hiatal hernia and reticular  pulmonary findings concerning for aspiration.  We discussed the possibility of undergoing a redo hiatal hernia repair.  She is agree to proceed but is unsure of the date.  She will call us back to schedule her procedure.  She will require and esophagogastroscopy, and robotic assisted hiatal hernia repair.   [name] 04/22/2022 9:42 PM

## 2022-04-21 NOTE — H&P (View-Only) (Signed)
      [address]       [place] [address]             [phone]        [name] Medical Record #[id number] Date of Birth: Nov 22, 1950  Referring: Garner Nash, DO Primary Care: Tammi Sou, MD Primary Cardiologist:Jayadeep Irish Lack, MD  Reason for visit:   follow-up  History of Present Illness:     71 year old female presents in follow-up to discuss results of her cross-sectional imaging.  She is status post paraesophageal hernia repair several months ago but unfortunately developed a recurrence with a moderate-sized hernia.  Her biggest complaint has been respiratory in nature which is likely due to silent aspiration from her hiatal hernia.  Physical Exam: BP 130/77   Pulse 90   Resp 20   Ht '5\' 4"'$  (1.626 m)   Wt 135 lb (61.2 kg)   SpO2 97%   BMI 23.17 kg/m   Alert NAD Abdomen, ND No peripheral edema   Diagnostic Studies & Laboratory data: CT Chest: FINDINGS: Cardiovascular: Normal heart size. No pericardial effusion. Severe three-vessel coronary artery calcifications. Mitral annular calcifications. Atherosclerotic disease of the thoracic aorta.   Mediastinum/Nodes: Moderate hiatal hernia. Thyroid is unremarkable. No pathologically enlarged lymph nodes seen in the chest.   Lungs/Pleura: Central airways are patent. No evidence of air trapping. Moderate centrilobular emphysema. No consolidation, pleural effusion or pneumothorax. Mild basilar subpleural reticular opacities are less prominent with prone imaging and likely due to atelectasis. Small focus of persistent linear opacities involving the right lower lobe is likely due to focal scarring.   Upper Abdomen: Cholecystectomy clips. Unchanged simple appearing cyst of the left kidney. No acute abnormality.   Musculoskeletal: No chest wall mass or suspicious bone lesions identified.    Assessment / Plan:   71yo female with recurrent hiatal hernia and reticular  pulmonary findings concerning for aspiration.  We discussed the possibility of undergoing a redo hiatal hernia repair.  She is agree to proceed but is unsure of the date.  She will call us back to schedule her procedure.  She will require and esophagogastroscopy, and robotic assisted hiatal hernia repair.   [name] 04/22/2022 9:42 PM

## 2022-04-22 ENCOUNTER — Ambulatory Visit (INDEPENDENT_AMBULATORY_CARE_PROVIDER_SITE_OTHER): Payer: Self-pay | Admitting: Thoracic Surgery (Cardiothoracic Vascular Surgery)

## 2022-04-22 VITALS — BP 130/77 | HR 90 | Resp 20 | Ht 64.0 in | Wt 135.0 lb

## 2022-04-22 DIAGNOSIS — K449 Diaphragmatic hernia without obstruction or gangrene: Secondary | ICD-10-CM

## 2022-04-26 ENCOUNTER — Encounter: Payer: Self-pay | Admitting: *Deleted

## 2022-04-26 ENCOUNTER — Ambulatory Visit (INDEPENDENT_AMBULATORY_CARE_PROVIDER_SITE_OTHER): Payer: PPO | Admitting: Podiatry

## 2022-04-26 ENCOUNTER — Other Ambulatory Visit: Payer: Self-pay | Admitting: *Deleted

## 2022-04-26 DIAGNOSIS — K449 Diaphragmatic hernia without obstruction or gangrene: Secondary | ICD-10-CM

## 2022-04-26 DIAGNOSIS — B351 Tinea unguium: Secondary | ICD-10-CM | POA: Diagnosis not present

## 2022-04-26 DIAGNOSIS — G609 Hereditary and idiopathic neuropathy, unspecified: Secondary | ICD-10-CM | POA: Diagnosis not present

## 2022-04-26 MED ORDER — CICLOPIROX 8 % EX SOLN
Freq: Every day | CUTANEOUS | 0 refills | Status: DC
Start: 2022-04-26 — End: 2022-06-08

## 2022-04-26 MED ORDER — GABAPENTIN 300 MG PO CAPS: 300.0000 mg | ORAL_CAPSULE | Freq: Every day | ORAL | 3 refills | Status: DC

## 2022-04-27 LAB — B12 AND FOLATE PANEL
Folate: 10.4 ng/mL (ref >3.0)
Vitamin B-12: 727 pg/mL (ref 232–1245)

## 2022-04-28 ENCOUNTER — Telehealth: Payer: Self-pay

## 2022-04-28 NOTE — Telephone Encounter (Signed)
Ciclopirox 8% solution   Prior Authorization was started on 04/26/22.   Deniedon August 2 The criteria (requirements) used to make this decision: 1) Patient does not have lunula (matrix) involvement, AND; 2) One of the following: a) Diagnosis of onychomycosis of the toenails, OR; b) Diagnosis of onychomycosis of the fingernails

## 2022-05-01 NOTE — Progress Notes (Signed)
  Subjective:  Patient ID: [name], female    DOB: 06/30/1951,  MRN: [id number]  Chief Complaint  Patient presents with   Foot Problem    Both big toenails have nails messed up, numbness and tingling at the top of each toe, standing too long triggers pain, soak baths with Epson salt helps with pain     71 y.o. female presents with the above complaint. History confirmed with patient.   Objective:  Physical Exam: warm, good capillary refill, no trophic changes or ulcerative lesions, normal DP and PT pulses, abnormal sensory exam, and onycholysis and discoloration both hallux nails.  Loss of protective sensation to the tips of the toes  Assessment:   1. Idiopathic neuropathy   2. Onychomycosis      Plan:  Patient was evaluated and treated and all questions answered.   Discussed etiology and treatment options of neuropathy in detail.  We discussed oral treatment with gabapentin.  Rx was sent to pharmacy.  We discussed the risk and possible side effects.  B12 and folate panel was ordered  We also discussed onychomycosis in detail.  Discussed oral and topical treatment.  Recommended topical treatment with ciclopirox.  Rx for this was sent to pharmacy.  Coverage by her insurance was not denied due to lack of culture.  I have advised her at some point when she is able to come back to the office and we will culture the nail plate  Return in about 6 months (around 10/27/2022) for follow up on nail fungus and neuropathy.

## 2022-05-09 ENCOUNTER — Encounter (HOSPITAL_COMMUNITY): Payer: Self-pay

## 2022-05-09 NOTE — Progress Notes (Signed)
Surgical Instructions    Your procedure is scheduled on Thursday, 05/12/22.  Report to [facility] Main Entrance "A" at 5:30 A.M., then check in with the Admitting office.  Call this number if you have problems the morning of surgery:  [phone]   If you have any questions prior to your surgery date call [phone]: Open Monday-Friday 8am-4pm    Remember:  Do not eat or drink after midnight the night before your surgery      Take these medicines the morning of surgery with A SIP OF WATER:  atorvastatin (LIPITOR) docusate sodium (COLACE)  ezetimibe (ZETIA)  famotidine (PEPCID) Tiotropium Bromide Monohydrate (SPIRIVA RESPIMAT) tolterodine (DETROL LA)   IF NEEDED: albuterol (VENTOLIN HFA) inhaler- bring with you the day of surgery fexofenadine (ALLEGRA)  nitroGLYCERIN (NITROSTAT)   As of today, STOP taking any Aspirin (unless otherwise instructed by your surgeon) Aleve, Naproxen, Ibuprofen, Motrin, Advil, Goody's, BC's, all herbal medications, fish oil, and all vitamins.  PLEASE FOLLOW YOUR SURGEONS INSTRUCTIONS IN REGARDS TO STOPPING clopidogrel (PLAVIX) .          Do not wear jewelry or makeup. Do not wear lotions, powders, perfumes/cologne or deodorant. Do not shave 48 hours prior to surgery.   Do not bring valuables to the hospital. Do not wear nail polish, gel polish, artificial nails, or any other type of covering on natural nails (fingers and toes) If you have artificial nails or gel coating that need to be removed by a nail salon, please have this removed prior to surgery. Artificial nails or gel coating may interfere with anesthesia's ability to adequately monitor your vital signs.  [facility] is not responsible for any belongings or valuables.    Do NOT Smoke (Tobacco/Vaping)  24 hours prior to your procedure  If you use a CPAP at night, you may bring your mask for your overnight stay.   Contacts, glasses, hearing aids, dentures or partials may not be worn  into surgery, please bring cases for these belongings   For patients admitted to the hospital, discharge time will be determined by your treatment team.   Patients discharged the day of surgery will not be allowed to drive home, and someone needs to stay with them for 24 hours.   SURGICAL WAITING ROOM VISITATION Patients having surgery or a procedure may have no more than 2 support people in the waiting area - these visitors may rotate.   Children under the age of 69 must have an adult with them who is not the patient. If the patient needs to stay at the hospital during part of their recovery, the visitor guidelines for inpatient rooms apply. Pre-op nurse will coordinate an appropriate time for 1 support person to accompany patient in pre-op.  This support person may not rotate.   Please refer to the [facility] website for the visitor guidelines for Inpatients (after your surgery is over and you are in a regular room).    Special instructions:    Oral Hygiene is also important to reduce your risk of infection.  Remember - BRUSH YOUR TEETH THE MORNING OF SURGERY WITH YOUR REGULAR TOOTHPASTE   [place]- Preparing For Surgery  Before surgery, you can play an important role. Because skin is not sterile, your skin needs to be as free of germs as possible. You can reduce the number of germs on your skin by washing with CHG (chlorahexidine gluconate) Soap before surgery.  CHG is an antiseptic cleaner which kills germs and bonds with the  skin to continue killing germs even after washing.     Please do not use if you have an allergy to CHG or antibacterial soaps. If your skin becomes reddened/irritated stop using the CHG.  Do not shave (including legs and underarms) for at least 48 hours prior to first CHG shower. It is OK to shave your face.  Please follow these instructions carefully.     Shower the NIGHT BEFORE SURGERY and the MORNING OF SURGERY with CHG Soap.   If you chose to wash  your hair, wash your hair first as usual with your normal shampoo. After you shampoo, rinse your hair and body thoroughly to remove the shampoo.  Then [employer] and genitals (private parts) with your normal soap and rinse thoroughly to remove soap.  After that Use CHG Soap as you would any other liquid soap. You can apply CHG directly to the skin and wash gently with a scrungie or a clean washcloth.   Apply the CHG Soap to your body ONLY FROM THE NECK DOWN.  Do not use on open wounds or open sores. Avoid contact with your eyes, ears, mouth and genitals (private parts). Wash Face and genitals (private parts)  with your normal soap.   Wash thoroughly, paying special attention to the area where your surgery will be performed.  Thoroughly rinse your body with warm water from the neck down.  DO NOT shower/wash with your normal soap after using and rinsing off the CHG Soap.  Pat yourself dry with a CLEAN TOWEL.  Wear CLEAN PAJAMAS to bed the night before surgery  Place CLEAN SHEETS on your bed the night before your surgery  DO NOT SLEEP WITH PETS.   Day of Surgery: Take a shower with CHG soap. Wear Clean/Comfortable clothing the morning of surgery Do not apply any deodorants/lotions.   Remember to brush your teeth WITH YOUR REGULAR TOOTHPASTE.    If you received a COVID test during your pre-op visit, it is requested that you wear a mask when out in public, stay away from anyone that may not be feeling well, and notify your surgeon if you develop symptoms. If you have been in contact with anyone that has tested positive in the last 10 days, please notify your surgeon.    Please read over the following fact sheets that you were given.

## 2022-05-10 ENCOUNTER — Ambulatory Visit: Payer: PPO | Admitting: Family Medicine

## 2022-05-10 ENCOUNTER — Encounter (HOSPITAL_COMMUNITY): Payer: Self-pay

## 2022-05-10 ENCOUNTER — Ambulatory Visit (HOSPITAL_COMMUNITY)
Admission: RE | Admit: 2022-05-10 | Discharge: 2022-05-10 | Disposition: A | Discharge status: Home / Self Care | Payer: PPO | Source: Ambulatory Visit | Attending: Thoracic Surgery (Cardiothoracic Vascular Surgery) | Admitting: Thoracic Surgery (Cardiothoracic Vascular Surgery)

## 2022-05-10 ENCOUNTER — Other Ambulatory Visit: Payer: Self-pay

## 2022-05-10 ENCOUNTER — Encounter (HOSPITAL_COMMUNITY)
Admission: RE | Admit: 2022-05-10 | Discharge: 2022-05-10 | Disposition: A | Discharge status: Home / Self Care | Payer: PPO | Source: Ambulatory Visit | Attending: Thoracic Surgery (Cardiothoracic Vascular Surgery) | Admitting: Thoracic Surgery (Cardiothoracic Vascular Surgery)

## 2022-05-10 VITALS — BP 121/74 | HR 73 | Temp 98.0°F | Resp 17 | Ht 64.0 in | Wt 135.9 lb

## 2022-05-10 DIAGNOSIS — J849 Interstitial pulmonary disease, unspecified: Secondary | ICD-10-CM | POA: Diagnosis present

## 2022-05-10 DIAGNOSIS — K449 Diaphragmatic hernia without obstruction or gangrene: Secondary | ICD-10-CM | POA: Insufficient documentation

## 2022-05-10 DIAGNOSIS — I2510 Atherosclerotic heart disease of native coronary artery without angina pectoris: Secondary | ICD-10-CM | POA: Diagnosis present

## 2022-05-10 DIAGNOSIS — Z20822 Contact with and (suspected) exposure to covid-19: Secondary | ICD-10-CM | POA: Diagnosis present

## 2022-05-10 DIAGNOSIS — Z87891 Personal history of nicotine dependence: Secondary | ICD-10-CM | POA: Diagnosis not present

## 2022-05-10 DIAGNOSIS — Z8673 Personal history of transient ischemic attack (TIA), and cerebral infarction without residual deficits: Secondary | ICD-10-CM | POA: Diagnosis not present

## 2022-05-10 DIAGNOSIS — R7303 Prediabetes: Secondary | ICD-10-CM | POA: Diagnosis present

## 2022-05-10 DIAGNOSIS — K219 Gastro-esophageal reflux disease without esophagitis: Secondary | ICD-10-CM | POA: Diagnosis present

## 2022-05-10 DIAGNOSIS — J439 Emphysema, unspecified: Secondary | ICD-10-CM | POA: Diagnosis present

## 2022-05-10 DIAGNOSIS — J449 Chronic obstructive pulmonary disease, unspecified: Secondary | ICD-10-CM | POA: Diagnosis not present

## 2022-05-10 DIAGNOSIS — I739 Peripheral vascular disease, unspecified: Secondary | ICD-10-CM | POA: Diagnosis present

## 2022-05-10 DIAGNOSIS — I10 Essential (primary) hypertension: Secondary | ICD-10-CM | POA: Diagnosis present

## 2022-05-10 DIAGNOSIS — J9811 Atelectasis: Secondary | ICD-10-CM | POA: Diagnosis not present

## 2022-05-10 DIAGNOSIS — Z01818 Encounter for other preprocedural examination: Secondary | ICD-10-CM | POA: Insufficient documentation

## 2022-05-10 DIAGNOSIS — N3281 Overactive bladder: Secondary | ICD-10-CM | POA: Diagnosis present

## 2022-05-10 DIAGNOSIS — K222 Esophageal obstruction: Secondary | ICD-10-CM | POA: Diagnosis not present

## 2022-05-10 DIAGNOSIS — E782 Mixed hyperlipidemia: Secondary | ICD-10-CM | POA: Diagnosis present

## 2022-05-10 HISTORY — DX: Atherosclerotic heart disease of native coronary artery without angina pectoris: I25.10

## 2022-05-10 LAB — COMPREHENSIVE METABOLIC PANEL
ALT: 25 U/L (ref 0–44)
AST: 28 U/L (ref 15–41)
Albumin: 4.5 g/dL (ref 3.5–5.0)
Alkaline Phosphatase: 76 U/L (ref 38–126)
Anion gap: 7 (ref 5–15)
BUN: 11 mg/dL (ref 8–23)
CO2: 28 mmol/L (ref 22–32)
Calcium: 10.2 mg/dL (ref 8.9–10.3)
Chloride: 105 mmol/L (ref 98–111)
Creatinine, Ser: 0.71 mg/dL (ref 0.44–1.00)
GFR, Estimated: >60 mL/min (ref >60)
Glucose, Bld: 98 mg/dL (ref 70–99)
Potassium: 3.8 mmol/L (ref 3.5–5.1)
Sodium: 140 mmol/L (ref 135–145)
Total Bilirubin: 1.9 mg/dL — ABNORMAL HIGH (ref 0.3–1.2)
Total Protein: 7.2 g/dL (ref 6.5–8.1)

## 2022-05-10 LAB — CBC
HCT: 44.5 % (ref 36.0–46.0)
Hemoglobin: 14.8 g/dL (ref 12.0–15.0)
MCH: 31.3 pg (ref 26.0–34.0)
MCHC: 33.3 g/dL (ref 30.0–36.0)
MCV: 94.1 fL (ref 80.0–100.0)
Platelets: 245 10*3/uL (ref 150–400)
RBC: 4.73 MIL/uL (ref 3.87–5.11)
RDW: 12.8 % (ref 11.5–15.5)
WBC: 7 10*3/uL (ref 4.0–10.5)
nRBC: 0 % (ref 0.0–0.2)

## 2022-05-10 LAB — URINALYSIS, ROUTINE W REFLEX MICROSCOPIC
Bilirubin Urine: NEGATIVE
Glucose, UA: NEGATIVE mg/dL
Hgb urine dipstick: NEGATIVE
Ketones, ur: NEGATIVE mg/dL
Leukocytes,Ua: NEGATIVE
Nitrite: NEGATIVE
Protein, ur: NEGATIVE mg/dL
Specific Gravity, Urine: 1.018 (ref 1.005–1.030)
pH: 6 (ref 5.0–8.0)

## 2022-05-10 LAB — TYPE AND SCREEN
ABO/RH(D): O POS
Antibody Screen: NEGATIVE

## 2022-05-10 LAB — PROTIME-INR
INR: 1 (ref 0.8–1.2)
Prothrombin Time: 12.7 seconds (ref 11.4–15.2)

## 2022-05-10 LAB — SURGICAL PCR SCREEN
MRSA, PCR: NEGATIVE
Staphylococcus aureus: NEGATIVE

## 2022-05-10 LAB — APTT: aPTT: 26 seconds (ref 24–36)

## 2022-05-10 LAB — SARS CORONAVIRUS 2 (TAT 6-24 HRS): SARS Coronavirus 2: NEGATIVE

## 2022-05-10 NOTE — Progress Notes (Signed)
PCP - Dr. Shawnie Dapper Cardiologist - Dr. Larae Grooms  PPM/ICD - denies   Chest x-ray - 05/10/22 EKG - 05/10/22 Stress Test - 02/05/19 ECHO - 02/07/06 Cardiac Cath - 01/2012  Sleep Study - denies   DM- denies  Blood Thinner Instructions: Pt instructed to hold Plavix 5 days prior to surgery. Last dose 8/11. Aspirin Instructions: n/a  ERAS Protcol - no, NPO   COVID TEST- 05/10/22   Anesthesia review: yes, cardiac hx  Patient denies shortness of breath, fever, cough and chest pain at PAT appointment   All instructions explained to the patient, with a verbal understanding of the material. Patient agrees to go over the instructions while at home for a better understanding. Patient also instructed to wear a mask in public after being tested for COVID-19. The opportunity to ask questions was provided.

## 2022-05-11 NOTE — Anesthesia Preprocedure Evaluation (Addendum)
Anesthesia Evaluation  Patient identified by MRN, date of birth, ID band Patient awake    Reviewed: Allergy & Precautions, NPO status , Patient's Chart, lab work & pertinent test results  Airway Mallampati: II  TM Distance: >3 FB Neck ROM: Full    Dental  (+) Dental Advisory Given, Edentulous Upper, Edentulous Lower   Pulmonary COPD, former smoker,    Pulmonary exam normal breath sounds clear to auscultation       Cardiovascular hypertension, Pt. on medications + CAD, + Peripheral Vascular Disease and + DOE  Normal cardiovascular exam Rhythm:Regular Rate:Normal     Neuro/Psych PSYCHIATRIC DISORDERS Anxiety TIA Neuromuscular disease    GI/Hepatic Neg liver ROS, hiatal hernia, GERD  ,  Endo/Other  negative endocrine ROS  Renal/GU negative Renal ROS     Musculoskeletal negative musculoskeletal ROS (+)   Abdominal   Peds  Hematology  (+) Blood dyscrasia, anemia ,   Anesthesia Other Findings   Reproductive/Obstetrics                           Anesthesia Physical Anesthesia Plan  ASA: 3  Anesthesia Plan: General   Post-op Pain Management: Tylenol PO (pre-op)*   Induction: Intravenous  PONV Risk Score and Plan: 4 or greater and Ondansetron, Dexamethasone and Treatment may vary due to age or medical condition  Airway Management Planned: Double Lumen EBT and Oral ETT  Additional Equipment: Arterial line  Intra-op Plan:   Post-operative Plan: Extubation in OR  Informed Consent: I have reviewed the patients History and Physical, chart, labs and discussed the procedure including the risks, benefits and alternatives for the proposed anesthesia with the patient or authorized representative who has indicated his/her understanding and acceptance.     Dental advisory given  Plan Discussed with: CRNA  Anesthesia Plan Comments: (2x piv  PAT note by Karoline Caldwell, PA-C: Follows with  cardiology for history of CAD, HTN, HLD. She had a cardiac cath in 2013 showing no significant coronary artery disease and only minimal coronary calcification.Stress test 2020 was low risk. Previously cleared by Dr. Irish Lack 12/07/2020 prior to undergoing hiatal hernia repair. Per note, he recommended coronary CTA for evaluation prior to surgery. CTA was ultimately sent for [facility] analysis which did not show any significant stenosis. Dr. Irish Lack commented on this result 12/22/2020 stating, "No significant CAD. THere is some mild disease, but it does not restrict blood flow. OK to go ahead with surgery from a cardiac standpoint." Last seen by Dr. Irish Lack 06/09/21.  Stable at that time from cardiac standpoint, no changes to management, recommended follow-up 1 year.  Follows with vascular surgery for history ofbilateralcarotid stenosisand innominate vein stenosis. She has remote history of left common carotid artery stent 03/31/05, innominate artery stent 06/23/06, and right carotid endarterectomy 08/21/07 .Last seen 07/20/2021.  At that time duplex showedno evidence of stenosis in the right ICA, 40 to 59% in the left ICA, normal flow hemodynamics and bilateral subclavian arteries.  She was recommended to continue statin and Plavix and follow-up in 1 year with repeat imaging.  Follows with pulmonology for history of DOE.  She is a former smoker.  Low-dose lung cancer screening CT May 2023 suggested ILD although there was no lung cancer.  Follow-up high-resolution CT showed no evidence of ILD, mild emphysema, recurrent moderate hiatal hernia.  Last seen by Dr. Chase Caller 04/12/2022.  Her symptoms were felt due to commendation of mild emphysema and recurrent hiatal hernia.  She advised  to start Spiriva Respimat and consider redo surgery for hiatal hernia.  Patient reports last dose Plavix 05/06/2022.  Preop labs reviewed, unremarkable.  EKG 05/10/22: NSR. Rate 68.  Low voltage QRS.  CT CHEST  03/08/2022: IMPRESSION: 1. No evidence of fibrotic interstitial lung disease. 2. Moderate hiatal hernia. 3. Aortic Atherosclerosis (ICD10-I70.0) and Emphysema (ICD10-J43.9).    Latest Ref Rng & Units 03/30/2022  10:24 AM 07/05/2019  2:41 PM PFT Results FVC-Predicted Pre % 80 P 80 P Pre FEV1/FVC % % 75 P 74 P DLCO UNC% % 91 P 79 P TLC % Predicted % 101 P 98 P RV % Predicted % 124 P 115 P  Carotid duplex 07/20/2021: Summary:  Right Carotid: There is no evidence of stenosis in the right ICA.  Left Carotid: Velocities in the left ICA are consistent with a 40-59% stenosis. A left carotid stent is not visualized.  Vertebrals: Bilateral vertebral arteries demonstrate antegrade flow.  Subclavians: Normal flow hemodynamics were seen in bilateral subclavian arteries.   CT coronary FFR 12/21/2020: 1. Left Main: No significant stenosis.  2. LAD: No significant stenosis. 0.96, 0.88, 0.82 3. LCX: No significant stenosis. 0.96 4. RCA: No significant stenosis. 0.98, 0.85, 0.92  IMPRESSION: 1. CT FFR analysis didn't show any significant stenosis.  Stress test in 02/05/19: . Nuclear stress EF: 56%. . The left ventricular ejection fraction is normal (55-65%). Marland Kitchen No T wave inversion was noted during stress. . There was no ST segment deviation noted during stress. . The study is normal. . This is a low risk study.  Normal perfusion. LVEF 56% with normal wall motion. This is a low risk study. )      Anesthesia Quick Evaluation

## 2022-05-11 NOTE — Progress Notes (Signed)
Anesthesia Chart Review:  Follows with cardiology for history of CAD, HTN, HLD.  She had a cardiac cath in 2013 showing no significant coronary artery disease and only minimal coronary calcification.  Stress test 2020 was low risk. Previously cleared by Dr. Irish Lack 12/07/2020 prior to undergoing hiatal hernia repair.  Per note, he recommended coronary CTA for evaluation prior to surgery.  CTA was ultimately sent for [facility] analysis which did not show any significant stenosis.  Dr. Irish Lack commented on this result 12/22/2020 stating, "No significant CAD.  THere is some mild disease, but it does not restrict blood flow.  OK to go ahead with surgery from a cardiac standpoint." Last seen by Dr. Irish Lack 06/09/21.  Stable at that time from cardiac standpoint, no changes to management, recommended follow-up 1 year.   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 She has remote history of left common carotid artery stent 03/31/05, innominate artery stent 06/23/06, and right carotid endarterectomy 08/21/07 .  Last seen 07/20/2021.  At that time duplex showed no evidence of stenosis in the right ICA, 40 to 59% in the left ICA, normal flow hemodynamics and bilateral subclavian arteries.  She was recommended to continue statin and Plavix and follow-up in 1 year with repeat imaging.   Follows with pulmonology for history of DOE.  She is a former smoker.  Low-dose lung cancer screening CT May 2023 suggested ILD although there was no lung cancer.  Follow-up high-resolution CT showed no evidence of ILD, mild emphysema, recurrent moderate hiatal hernia.  Last seen by Dr. Chase Caller 04/12/2022.  Her symptoms were felt due to commendation of mild emphysema and recurrent hiatal hernia.  She advised to start Spiriva Respimat and consider redo surgery for hiatal hernia.  Patient reports last dose Plavix 05/06/2022.  Preop labs reviewed, unremarkable.   EKG 05/10/22: NSR.  Rate 68.   Low voltage QRS.  CT CHEST 03/08/2022: IMPRESSION: 1. No evidence of fibrotic interstitial lung disease. 2. Moderate hiatal hernia. 3. Aortic Atherosclerosis (ICD10-I70.0) and Emphysema (ICD10-J43.9).      Latest Ref Rng & Units 03/30/2022   10:24 AM 07/05/2019    2:41 PM  PFT Results  FVC-Predicted Pre % 80  P 80  P  Pre FEV1/FVC % % 75  P 74  P  DLCO UNC% % 91  P 79  P  TLC % Predicted % 101  P 98  P  RV % Predicted % 124  P 115  P     Carotid duplex 07/20/2021: Summary:  Right Carotid: There is no evidence of stenosis in the right ICA.  Left Carotid: Velocities in the left ICA are consistent with a 40-59% stenosis. A left carotid stent is not visualized.  Vertebrals:  Bilateral vertebral arteries demonstrate antegrade flow.  Subclavians: Normal flow hemodynamics were seen in bilateral subclavian arteries.    CT coronary FFR 12/21/2020: 1. Left Main: No significant stenosis.   2. LAD: No significant stenosis.  0.96, 0.88, 0.82 3. LCX: No significant stenosis.  0.96 4. RCA: No significant stenosis.  0.98, 0.85, 0.92   IMPRESSION: 1.  CT FFR analysis didn't show any significant stenosis.   Stress test in 02/05/19: Nuclear stress EF: 56%. The left ventricular ejection fraction is normal (55-65%). No T wave inversion was noted during stress. There was no ST segment deviation noted during stress. The study is normal. This is a low risk study.   Normal perfusion. LVEF 56% with normal wall motion.  This is a low risk study.    Wynonia Musty [facility] Short Stay Center/Anesthesiology Phone [phone] 05/11/2022 9:55 AM

## 2022-05-12 ENCOUNTER — Inpatient Hospital Stay (HOSPITAL_COMMUNITY)
Admission: RE | Admit: 2022-05-12 | Discharge: 2022-05-13 | DRG: 327 | Disposition: A | Discharge status: Home / Self Care | Payer: PPO | Attending: Thoracic Surgery (Cardiothoracic Vascular Surgery) | Admitting: Thoracic Surgery (Cardiothoracic Vascular Surgery)

## 2022-05-12 ENCOUNTER — Inpatient Hospital Stay (HOSPITAL_COMMUNITY): Payer: PPO | Admitting: Certified Registered Nurse Anesthetist

## 2022-05-12 ENCOUNTER — Other Ambulatory Visit: Payer: Self-pay

## 2022-05-12 ENCOUNTER — Encounter (HOSPITAL_COMMUNITY): Payer: Self-pay | Admitting: Thoracic Surgery (Cardiothoracic Vascular Surgery)

## 2022-05-12 ENCOUNTER — Encounter (HOSPITAL_COMMUNITY)
Admission: RE | Disposition: A | Discharge status: Home / Self Care | Payer: Self-pay | Source: Home / Self Care | Attending: Thoracic Surgery (Cardiothoracic Vascular Surgery)

## 2022-05-12 ENCOUNTER — Inpatient Hospital Stay (HOSPITAL_COMMUNITY): Payer: PPO | Admitting: Physician Assistant

## 2022-05-12 ENCOUNTER — Inpatient Hospital Stay (HOSPITAL_COMMUNITY): Payer: PPO

## 2022-05-12 DIAGNOSIS — Z9889 Other specified postprocedural states: Principal | ICD-10-CM

## 2022-05-12 DIAGNOSIS — Z87891 Personal history of nicotine dependence: Secondary | ICD-10-CM | POA: Diagnosis not present

## 2022-05-12 DIAGNOSIS — J449 Chronic obstructive pulmonary disease, unspecified: Secondary | ICD-10-CM

## 2022-05-12 DIAGNOSIS — K219 Gastro-esophageal reflux disease without esophagitis: Secondary | ICD-10-CM | POA: Diagnosis present

## 2022-05-12 DIAGNOSIS — I739 Peripheral vascular disease, unspecified: Secondary | ICD-10-CM | POA: Diagnosis present

## 2022-05-12 DIAGNOSIS — N3281 Overactive bladder: Secondary | ICD-10-CM | POA: Diagnosis present

## 2022-05-12 DIAGNOSIS — E782 Mixed hyperlipidemia: Secondary | ICD-10-CM | POA: Diagnosis present

## 2022-05-12 DIAGNOSIS — I10 Essential (primary) hypertension: Secondary | ICD-10-CM | POA: Diagnosis present

## 2022-05-12 DIAGNOSIS — K449 Diaphragmatic hernia without obstruction or gangrene: Secondary | ICD-10-CM

## 2022-05-12 DIAGNOSIS — Z20822 Contact with and (suspected) exposure to covid-19: Secondary | ICD-10-CM | POA: Diagnosis present

## 2022-05-12 DIAGNOSIS — I2510 Atherosclerotic heart disease of native coronary artery without angina pectoris: Secondary | ICD-10-CM | POA: Diagnosis present

## 2022-05-12 DIAGNOSIS — J439 Emphysema, unspecified: Secondary | ICD-10-CM | POA: Diagnosis present

## 2022-05-12 DIAGNOSIS — Z8673 Personal history of transient ischemic attack (TIA), and cerebral infarction without residual deficits: Secondary | ICD-10-CM | POA: Diagnosis not present

## 2022-05-12 DIAGNOSIS — J849 Interstitial pulmonary disease, unspecified: Secondary | ICD-10-CM | POA: Diagnosis present

## 2022-05-12 DIAGNOSIS — J9811 Atelectasis: Secondary | ICD-10-CM | POA: Diagnosis not present

## 2022-05-12 DIAGNOSIS — R7303 Prediabetes: Secondary | ICD-10-CM | POA: Diagnosis present

## 2022-05-12 HISTORY — PX: PEXY: SHX6024

## 2022-05-12 HISTORY — PX: ESOPHAGOGASTRODUODENOSCOPY: SHX5428

## 2022-05-12 HISTORY — PX: XI ROBOTIC ASSISTED HIATAL HERNIA REPAIR: SHX6889

## 2022-05-12 SURGERY — REPAIR, HERNIA, HIATAL, ROBOT-ASSISTED
Anesthesia: General | Site: Abdomen

## 2022-05-12 MED ORDER — ACETAMINOPHEN 160 MG/5ML PO SOLN: 1000.0000 mg | Freq: Four times a day (QID) | ORAL | Status: DC

## 2022-05-12 MED ORDER — AMISULPRIDE (ANTIEMETIC) 5 MG/2ML IV SOLN
10.0000 mg | Freq: Once | INTRAVENOUS | Status: AC | PRN
  Administered 2022-05-12: 10 mg via INTRAVENOUS

## 2022-05-12 MED ORDER — PROMETHAZINE HCL 25 MG/ML IJ SOLN
6.2500 mg | Freq: Once | INTRAMUSCULAR | Status: AC
  Administered 2022-05-12: 6.25 mg via INTRAVENOUS

## 2022-05-12 MED ORDER — PROMETHAZINE HCL 25 MG/ML IJ SOLN
INTRAMUSCULAR | Status: AC
  Filled 2022-05-12: qty 1

## 2022-05-12 MED ORDER — FENTANYL CITRATE (PF) 250 MCG/5ML IJ SOLN
INTRAMUSCULAR | Status: AC
  Filled 2022-05-12: qty 5

## 2022-05-12 MED ORDER — ONDANSETRON HCL 4 MG/2ML IJ SOLN
INTRAMUSCULAR | Status: AC
  Filled 2022-05-12: qty 2

## 2022-05-12 MED ORDER — PROPOFOL 10 MG/ML IV BOLUS
INTRAVENOUS | Status: AC
  Filled 2022-05-12: qty 20

## 2022-05-12 MED ORDER — FLUTICASONE PROPIONATE 50 MCG/ACT NA SUSP
2.0000 | Freq: Every day | NASAL | Status: DC
  Administered 2022-05-12: 2 via NASAL
  Filled 2022-05-12: qty 16

## 2022-05-12 MED ORDER — ACETAMINOPHEN 500 MG PO TABS
1000.0000 mg | ORAL_TABLET | Freq: Once | ORAL | Status: AC
  Administered 2022-05-12: 1000 mg via ORAL

## 2022-05-12 MED ORDER — KETOROLAC TROMETHAMINE 15 MG/ML IJ SOLN
INTRAMUSCULAR | Status: AC
  Administered 2022-05-12: 15 mg
  Filled 2022-05-12: qty 1

## 2022-05-12 MED ORDER — PROPOFOL 10 MG/ML IV BOLUS
INTRAVENOUS | Status: DC | PRN
  Administered 2022-05-12: 100 mg via INTRAVENOUS
  Administered 2022-05-12: 20 mg via INTRAVENOUS

## 2022-05-12 MED ORDER — VANCOMYCIN HCL IN DEXTROSE 1-5 GM/200ML-% IV SOLN
1000.0000 mg | INTRAVENOUS | Status: AC
  Administered 2022-05-12: 1000 mg via INTRAVENOUS
  Filled 2022-05-12: qty 200

## 2022-05-12 MED ORDER — DEXAMETHASONE SODIUM PHOSPHATE 10 MG/ML IJ SOLN
INTRAMUSCULAR | Status: AC
  Filled 2022-05-12: qty 1

## 2022-05-12 MED ORDER — MIDAZOLAM HCL 2 MG/2ML IJ SOLN
INTRAMUSCULAR | Status: AC
  Filled 2022-05-12: qty 2

## 2022-05-12 MED ORDER — ALBUTEROL SULFATE (2.5 MG/3ML) 0.083% IN NEBU
2.5000 mg | INHALATION_SOLUTION | Freq: Four times a day (QID) | RESPIRATORY_TRACT | Status: DC | PRN
Start: 2022-05-12 — End: 2022-05-13

## 2022-05-12 MED ORDER — BUPIVACAINE HCL (PF) 0.5 % IJ SOLN
INTRAMUSCULAR | Status: AC
  Filled 2022-05-12: qty 30

## 2022-05-12 MED ORDER — BUPIVACAINE LIPOSOME 1.3 % IJ SUSP
INTRAMUSCULAR | Status: AC
Start: 2022-05-12 — End: ?
  Filled 2022-05-12: qty 20

## 2022-05-12 MED ORDER — ONDANSETRON HCL 4 MG/2ML IJ SOLN
INTRAMUSCULAR | Status: DC | PRN
  Administered 2022-05-12: 4 mg via INTRAVENOUS

## 2022-05-12 MED ORDER — LACTATED RINGERS IV SOLN: INTRAVENOUS | Status: DC

## 2022-05-12 MED ORDER — ALBUTEROL SULFATE HFA 108 (90 BASE) MCG/ACT IN AERS: 2.0000 | INHALATION_SPRAY | Freq: Four times a day (QID) | RESPIRATORY_TRACT | Status: DC | PRN

## 2022-05-12 MED ORDER — CHLORHEXIDINE GLUCONATE 0.12 % MT SOLN
OROMUCOSAL | Status: AC
  Filled 2022-05-12: qty 15

## 2022-05-12 MED ORDER — LACTATED RINGERS IV SOLN: INTRAVENOUS | Status: DC | PRN

## 2022-05-12 MED ORDER — ORAL CARE MOUTH RINSE: 15.0000 mL | Freq: Once | OROMUCOSAL | Status: AC

## 2022-05-12 MED ORDER — ACETAMINOPHEN 500 MG PO TABS
1000.0000 mg | ORAL_TABLET | Freq: Four times a day (QID) | ORAL | Status: DC
  Filled 2022-05-12: qty 2

## 2022-05-12 MED ORDER — SENNOSIDES-DOCUSATE SODIUM 8.6-50 MG PO TABS
1.0000 | ORAL_TABLET | Freq: Every day | ORAL | Status: DC
  Filled 2022-05-12: qty 1

## 2022-05-12 MED ORDER — OXYCODONE HCL 5 MG PO TABS: 5.0000 mg | ORAL_TABLET | ORAL | Status: DC | PRN

## 2022-05-12 MED ORDER — DEXAMETHASONE SODIUM PHOSPHATE 10 MG/ML IJ SOLN
INTRAMUSCULAR | Status: DC | PRN
  Administered 2022-05-12: 10 mg via INTRAVENOUS

## 2022-05-12 MED ORDER — PHENYLEPHRINE 80 MCG/ML (10ML) SYRINGE FOR IV PUSH (FOR BLOOD PRESSURE SUPPORT)
PREFILLED_SYRINGE | INTRAVENOUS | Status: AC
  Filled 2022-05-12: qty 10

## 2022-05-12 MED ORDER — ORAL CARE MOUTH RINSE: 15.0000 mL | OROMUCOSAL | Status: DC | PRN

## 2022-05-12 MED ORDER — ENOXAPARIN SODIUM 40 MG/0.4ML IJ SOSY
40.0000 mg | PREFILLED_SYRINGE | Freq: Every day | INTRAMUSCULAR | Status: DC
  Administered 2022-05-13: 40 mg via SUBCUTANEOUS
  Filled 2022-05-12: qty 0.4

## 2022-05-12 MED ORDER — MIDAZOLAM HCL 2 MG/2ML IJ SOLN
INTRAMUSCULAR | Status: DC | PRN
  Administered 2022-05-12 (×2): 1 mg via INTRAVENOUS

## 2022-05-12 MED ORDER — PHENYLEPHRINE 80 MCG/ML (10ML) SYRINGE FOR IV PUSH (FOR BLOOD PRESSURE SUPPORT)
PREFILLED_SYRINGE | INTRAVENOUS | Status: DC | PRN
  Administered 2022-05-12: 80 ug via INTRAVENOUS

## 2022-05-12 MED ORDER — UMECLIDINIUM BROMIDE 62.5 MCG/ACT IN AEPB
1.0000 | INHALATION_SPRAY | Freq: Every day | RESPIRATORY_TRACT | Status: DC
  Administered 2022-05-13: 1 via RESPIRATORY_TRACT
  Filled 2022-05-12: qty 7

## 2022-05-12 MED ORDER — ROCURONIUM BROMIDE 10 MG/ML (PF) SYRINGE
PREFILLED_SYRINGE | INTRAVENOUS | Status: DC | PRN
  Administered 2022-05-12 (×2): 20 mg via INTRAVENOUS
  Administered 2022-05-12: 60 mg via INTRAVENOUS

## 2022-05-12 MED ORDER — MORPHINE SULFATE (PF) 2 MG/ML IV SOLN: 2.0000 mg | INTRAVENOUS | Status: DC | PRN

## 2022-05-12 MED ORDER — VANCOMYCIN HCL IN DEXTROSE 1-5 GM/200ML-% IV SOLN
1000.0000 mg | Freq: Two times a day (BID) | INTRAVENOUS | Status: AC
  Administered 2022-05-12: 1000 mg via INTRAVENOUS
  Filled 2022-05-12: qty 200

## 2022-05-12 MED ORDER — LIDOCAINE 2% (20 MG/ML) 5 ML SYRINGE
INTRAMUSCULAR | Status: AC
  Filled 2022-05-12: qty 5

## 2022-05-12 MED ORDER — 0.9 % SODIUM CHLORIDE (POUR BTL) OPTIME
TOPICAL | Status: DC | PRN
  Administered 2022-05-12: 2000 mL

## 2022-05-12 MED ORDER — PHENYLEPHRINE HCL-NACL 20-0.9 MG/250ML-% IV SOLN
INTRAVENOUS | Status: DC | PRN
  Administered 2022-05-12: 25 ug/min via INTRAVENOUS

## 2022-05-12 MED ORDER — SUGAMMADEX SODIUM 200 MG/2ML IV SOLN
INTRAVENOUS | Status: DC | PRN
  Administered 2022-05-12: 200 mg via INTRAVENOUS

## 2022-05-12 MED ORDER — KETOROLAC TROMETHAMINE 15 MG/ML IJ SOLN
INTRAMUSCULAR | Status: AC
  Filled 2022-05-12: qty 1

## 2022-05-12 MED ORDER — ROCURONIUM BROMIDE 10 MG/ML (PF) SYRINGE
PREFILLED_SYRINGE | INTRAVENOUS | Status: AC
  Filled 2022-05-12: qty 10

## 2022-05-12 MED ORDER — AMISULPRIDE (ANTIEMETIC) 5 MG/2ML IV SOLN
INTRAVENOUS | Status: AC
  Filled 2022-05-12: qty 4

## 2022-05-12 MED ORDER — KETOROLAC TROMETHAMINE 15 MG/ML IJ SOLN
15.0000 mg | Freq: Four times a day (QID) | INTRAMUSCULAR | Status: DC
  Administered 2022-05-12 – 2022-05-13 (×5): 15 mg via INTRAVENOUS
  Filled 2022-05-12 (×5): qty 1

## 2022-05-12 MED ORDER — HYDROMORPHONE HCL 1 MG/ML IJ SOLN
0.2500 mg | INTRAMUSCULAR | Status: DC | PRN
  Administered 2022-05-12 (×2): 0.25 mg via INTRAVENOUS
  Administered 2022-05-12: 0.5 mg via INTRAVENOUS

## 2022-05-12 MED ORDER — ONDANSETRON HCL 4 MG/2ML IJ SOLN
4.0000 mg | Freq: Four times a day (QID) | INTRAMUSCULAR | Status: DC | PRN
Start: 2022-05-12 — End: 2022-05-13

## 2022-05-12 MED ORDER — CHLORHEXIDINE GLUCONATE 0.12 % MT SOLN
15.0000 mL | Freq: Once | OROMUCOSAL | Status: AC
  Administered 2022-05-12: 15 mL via OROMUCOSAL

## 2022-05-12 MED ORDER — LABETALOL HCL 5 MG/ML IV SOLN
INTRAVENOUS | Status: DC | PRN
  Administered 2022-05-12 (×2): 2.5 mg via INTRAVENOUS

## 2022-05-12 MED ORDER — CHLORHEXIDINE GLUCONATE CLOTH 2 % EX PADS
6.0000 | MEDICATED_PAD | Freq: Every day | CUTANEOUS | Status: DC
  Administered 2022-05-12 – 2022-05-13 (×2): 6 via TOPICAL

## 2022-05-12 MED ORDER — ACETAMINOPHEN 500 MG PO TABS
ORAL_TABLET | ORAL | Status: AC
  Filled 2022-05-12: qty 2

## 2022-05-12 MED ORDER — TIOTROPIUM BROMIDE MONOHYDRATE 1.25 MCG/ACT IN AERS: 2.0000 | INHALATION_SPRAY | Freq: Every day | RESPIRATORY_TRACT | Status: DC

## 2022-05-12 MED ORDER — BUPIVACAINE LIPOSOME 1.3 % IJ SUSP
INTRAMUSCULAR | Status: DC | PRN
  Administered 2022-05-12: 50 mL

## 2022-05-12 MED ORDER — FENTANYL CITRATE (PF) 250 MCG/5ML IJ SOLN
INTRAMUSCULAR | Status: DC | PRN
Start: 2022-05-12 — End: 2022-05-12
  Administered 2022-05-12: 25 ug via INTRAVENOUS
  Administered 2022-05-12 (×2): 50 ug via INTRAVENOUS
  Administered 2022-05-12: 125 ug via INTRAVENOUS
  Administered 2022-05-12: 50 ug via INTRAVENOUS

## 2022-05-12 MED ORDER — BISACODYL 5 MG PO TBEC
10.0000 mg | DELAYED_RELEASE_TABLET | Freq: Every day | ORAL | Status: DC
  Filled 2022-05-12: qty 2

## 2022-05-12 MED ORDER — HYDROMORPHONE HCL 1 MG/ML IJ SOLN
INTRAMUSCULAR | Status: AC
  Filled 2022-05-12: qty 1

## 2022-05-12 MED ORDER — VASOPRESSIN 20 UNIT/ML IV SOLN
INTRAVENOUS | Status: AC
  Filled 2022-05-12: qty 1

## 2022-05-12 MED ORDER — LABETALOL HCL 5 MG/ML IV SOLN
INTRAVENOUS | Status: AC
  Filled 2022-05-12: qty 4

## 2022-05-12 SURGICAL SUPPLY — 76 items
ADH SKN CLS APL DERMABOND .7 (GAUZE/BANDAGES/DRESSINGS) ×2
BLADE SURG 11 STRL SS (BLADE) ×2 IMPLANT
BUTTON OLYMPUS DEFENDO 5 PIECE (MISCELLANEOUS) ×2 IMPLANT
CANISTER SUCT 3000ML PPV (MISCELLANEOUS) ×4 IMPLANT
CATH THORACIC 28FR (CATHETERS) IMPLANT
CNTNR URN SCR LID CUP LEK RST (MISCELLANEOUS) ×2 IMPLANT
CONT SPEC 4OZ STRL OR WHT (MISCELLANEOUS) ×1
DEFOGGER SCOPE WARMER CLEARIFY (MISCELLANEOUS) ×2 IMPLANT
DERMABOND ADVANCED (GAUZE/BANDAGES/DRESSINGS) ×2
DERMABOND ADVANCED .7 DNX12 (GAUZE/BANDAGES/DRESSINGS) ×2 IMPLANT
DEVICE SUTURE ENDOST 10MM (ENDOMECHANICALS) IMPLANT
DRAIN PENROSE 1/4X12 LTX STRL (WOUND CARE) IMPLANT
DRAPE ARM DVNC X/XI (DISPOSABLE) ×8 IMPLANT
DRAPE COLUMN DVNC XI (DISPOSABLE) ×2 IMPLANT
DRAPE CV SPLIT W-CLR ANES SCRN (DRAPES) ×2 IMPLANT
DRAPE DA VINCI XI ARM (DISPOSABLE) ×4
DRAPE DA VINCI XI COLUMN (DISPOSABLE) ×1
DRAPE INCISE IOBAN 66X45 STRL (DRAPES) IMPLANT
DRAPE ORTHO SPLIT 77X108 STRL (DRAPES) ×1
DRAPE SURG ORHT 6 SPLT 77X108 (DRAPES) ×2 IMPLANT
ELECT REM PT RETURN 9FT ADLT (ELECTROSURGICAL) ×1
ELECTRODE REM PT RTRN 9FT ADLT (ELECTROSURGICAL) ×2 IMPLANT
FELT TEFLON 1X6 (MISCELLANEOUS) IMPLANT
GAUZE 4X4 16PLY ~~LOC~~+RFID DBL (SPONGE) IMPLANT
GAUZE SPONGE 4X4 12PLY STRL (GAUZE/BANDAGES/DRESSINGS) ×2 IMPLANT
GLOVE BIO SURGEON STRL SZ7 (GLOVE) ×2 IMPLANT
GLOVE BIO SURGEON STRL SZ7.5 (GLOVE) ×6 IMPLANT
GLOVE SURG SS PI 7.5 STRL IVOR (GLOVE) IMPLANT
GOWN STRL REUS W/ TWL LRG LVL3 (GOWN DISPOSABLE) ×2 IMPLANT
GOWN STRL REUS W/ TWL XL LVL3 (GOWN DISPOSABLE) ×4 IMPLANT
GOWN STRL REUS W/TWL 2XL LVL3 (GOWN DISPOSABLE) ×2 IMPLANT
GOWN STRL REUS W/TWL LRG LVL3 (GOWN DISPOSABLE) ×1
GOWN STRL REUS W/TWL XL LVL3 (GOWN DISPOSABLE) ×2
GRASPER SUT TROCAR 14GX15 (MISCELLANEOUS) IMPLANT
IRRIGATION STRYKERFLOW (MISCELLANEOUS) IMPLANT
IRRIGATOR STRYKERFLOW (MISCELLANEOUS)
IRRIGATOR SUCT 8 DISP DVNC XI (IRRIGATION / IRRIGATOR) IMPLANT
IRRIGATOR SUCTION 8MM XI DISP (IRRIGATION / IRRIGATOR) ×1
IV NS 1000ML (IV SOLUTION)
IV NS 1000ML BAXH (IV SOLUTION) IMPLANT
KIT BASIN OR (CUSTOM PROCEDURE TRAY) ×2 IMPLANT
KIT TURNOVER KIT B (KITS) ×2 IMPLANT
MARKER SKIN DUAL TIP RULER LAB (MISCELLANEOUS) ×2 IMPLANT
NS IRRIG 1000ML POUR BTL (IV SOLUTION) ×4 IMPLANT
OBTURATOR OPTICAL STANDARD 8MM (TROCAR) ×1
OBTURATOR OPTICAL STND 8 DVNC (TROCAR) ×1
OBTURATOR OPTICALSTD 8 DVNC (TROCAR) ×2 IMPLANT
OIL SILICONE PENTAX (PARTS (SERVICE/REPAIRS)) IMPLANT
PACK CHEST (CUSTOM PROCEDURE TRAY) ×2 IMPLANT
PAD ARMBOARD 7.5X6 YLW CONV (MISCELLANEOUS) ×4 IMPLANT
PORT ACCESS TROCAR AIRSEAL 12 (TROCAR) IMPLANT
PORT ACCESS TROCAR AIRSEAL 5M (TROCAR) ×1
SEAL CANN UNIV 5-8 DVNC XI (MISCELLANEOUS) ×8 IMPLANT
SEAL XI 5MM-8MM UNIVERSAL (MISCELLANEOUS) ×5
SEALER SYNCHRO 8 IS4000 DV (MISCELLANEOUS) ×1
SEALER SYNCHRO 8 IS4000 DVNC (MISCELLANEOUS) IMPLANT
SPONGE T-LAP 18X18 ~~LOC~~+RFID (SPONGE) ×2 IMPLANT
SUT ETHIBOND 0 36 GRN (SUTURE) ×4 IMPLANT
SUT SILK  1 MH (SUTURE) ×1
SUT SILK 1 MH (SUTURE) ×2 IMPLANT
SUT SURGIDAC NAB ES-9 0 48 120 (SUTURE) IMPLANT
SUT VIC AB 3-0 SH 27 (SUTURE) ×2
SUT VIC AB 3-0 SH 27X BRD (SUTURE) ×4 IMPLANT
SUT VICRYL 0 UR6 27IN ABS (SUTURE) ×4 IMPLANT
SYR 20ML ECCENTRIC (SYRINGE) ×2 IMPLANT
SYSTEM SAHARA CHEST DRAIN ATS (WOUND CARE) IMPLANT
TOWEL GREEN STERILE (TOWEL DISPOSABLE) ×2 IMPLANT
TOWEL GREEN STERILE FF (TOWEL DISPOSABLE) ×2 IMPLANT
TRAY FOLEY MTR SLVR 16FR STAT (SET/KITS/TRAYS/PACK) ×2 IMPLANT
TROCAR PORT AIRSEAL 8X120 (TROCAR) IMPLANT
TROCAR XCEL BLADELESS 5X75MML (TROCAR) ×2 IMPLANT
TROCAR XCEL NON-BLD 5MMX100MML (ENDOMECHANICALS) IMPLANT
TUBE CONNECTING 20X1/4 (TUBING) ×2 IMPLANT
TUBING ENDO SMARTCAP (MISCELLANEOUS) ×2 IMPLANT
UNDERPAD 30X36 HEAVY ABSORB (UNDERPADS AND DIAPERS) ×2 IMPLANT
WATER STERILE IRR 1000ML POUR (IV SOLUTION) ×2 IMPLANT

## 2022-05-12 NOTE — Anesthesia Procedure Notes (Signed)
Procedure Name: Intubation Date/Time: 05/12/2022 8:01 AM  Performed by: Harden Mo, CRNAPre-anesthesia Checklist: Patient identified, Emergency Drugs available, Suction available and Patient being monitored Patient Re-evaluated:Patient Re-evaluated prior to induction Oxygen Delivery Method: Circle System Utilized Preoxygenation: Pre-oxygenation with 100% oxygen Induction Type: IV induction Ventilation: Mask ventilation without difficulty and Oral airway inserted - appropriate to patient size Laryngoscope Size: Sabra Heck and 2 Grade View: Grade I Tube type: Oral Tube size: 7.0 mm Number of attempts: 1 Airway Equipment and Method: Stylet and Oral airway Placement Confirmation: ETT inserted through vocal cords under direct vision, positive ETCO2 and breath sounds checked- equal and bilateral Secured at: 22 cm Tube secured with: Tape Dental Injury: Teeth and Oropharynx as per pre-operative assessment

## 2022-05-12 NOTE — Interval H&P Note (Signed)
History and Physical Interval Note:  05/12/2022 7:38 AM  [name]  has presented today for surgery, with the diagnosis of RECURRENT HIATAL HERNIA.  The various methods of treatment have been discussed with the patient and family. After consideration of risks, benefits and other options for treatment, the patient has consented to  Procedure(s): XI ROBOTIC [place] (N/A) ESOPHAGOGASTRODUODENOSCOPY (EGD) (N/A) as a surgical intervention.  The patient's history has been reviewed, patient examined, no change in status, stable for surgery.  I have reviewed the patient's chart and labs.  Questions were answered to the patient's satisfaction.     [name] Bary Leriche

## 2022-05-12 NOTE — Anesthesia Procedure Notes (Signed)
Arterial Line Insertion Start/End8/17/2023 7:20 AM, 05/12/2022 7:28 AM Performed by: Nolon Nations, MD, anesthesiologist  Patient location: Pre-op. Preanesthetic checklist: patient identified, IV checked, site marked, risks and benefits discussed, surgical consent, monitors and equipment checked, pre-op evaluation, timeout performed and anesthesia consent Lidocaine 1% used for infiltration Left, radial was placed Catheter size: 20 G Hand hygiene performed  and maximum sterile barriers used   Attempts: 1 Procedure performed using ultrasound guided technique. Ultrasound Notes:anatomy identified, needle tip was noted to be adjacent to the nerve/plexus identified, no ultrasound evidence of intravascular and/or intraneural injection and image(s) printed for medical record Following insertion, dressing applied and Biopatch. Post procedure assessment: normal and unchanged  Post procedure complications: second provider assisted.

## 2022-05-12 NOTE — Anesthesia Procedure Notes (Signed)
Central Venous Catheter Insertion Performed by: Nolon Nations, MD, anesthesiologist Start/End8/17/2023 7:00 AM, 05/12/2022 7:20 AM Patient location: Pre-op. Preanesthetic checklist: patient identified, IV checked, site marked, risks and benefits discussed, surgical consent, monitors and equipment checked, pre-op evaluation, timeout performed and anesthesia consent Position: Trendelenburg Lidocaine 1% used for infiltration and patient sedated Hand hygiene performed , maximum sterile barriers used  and Seldinger technique used Catheter size: 8 Fr Total catheter length 16. Central line was placed.Double lumen Procedure performed using ultrasound guided technique. Ultrasound Notes:anatomy identified, needle tip was noted to be adjacent to the nerve/plexus identified, no ultrasound evidence of intravascular and/or intraneural injection and image(s) printed for medical record Attempts: 1 Following insertion, dressing applied, line sutured and Biopatch. Post procedure assessment: blood return through all ports, free fluid flow and no air  Patient tolerated the procedure well with no immediate complications.

## 2022-05-12 NOTE — Brief Op Note (Signed)
05/12/2022  10:43 AM  PATIENT:  [name]  71 y.o. female  PRE-OPERATIVE DIAGNOSIS:  RECURRENT HIATAL HERNIA  POST-OPERATIVE DIAGNOSIS:  RECURRENT HIATAL HERNIA  PROCEDURE:  XI ROBOTIC ASSISTED HIATAL HERNIA REPAIR  ESOPHAGOGASTRODUODENOSCOPY  GASTROPEXY   SURGEON:  Lightfoot, Lucile Crater, MD   PHYSICIAN ASSISTANT: Eldredge Veldhuizen  ASSISTANTS:  Cruz, Abbygail U, RN, Scrub Person                      Teague, [place] A, RN, Scrub Person  ANESTHESIA:   general  EBL:  25 mL   BLOOD ADMINISTERED:none  DRAINS: none   LOCAL MEDICATIONS USED:  BUPIVICAINE   SPECIMEN:  No Specimen  DISPOSITION OF SPECIMEN:  N/A  COUNTS:  YES  DICTATION: .Dragon Dictation  PLAN OF CARE: Admit to inpatient   PATIENT DISPOSITION:  PACU - hemodynamically stable.   Delay start of Pharmacological VTE agent (>24hrs) due to surgical blood loss or risk of bleeding: yes

## 2022-05-12 NOTE — Discharge Instructions (Signed)
Discharge Instructions:  1. You may shower, please wash incisions daily with soap and water and keep dry.  If you wish to cover wounds with dressing you may do so but please keep clean and change daily.  No tub baths or swimming until incisions have completely healed.  If your incisions become red or develop any drainage please call our office at [phone]  2. No Driving until cleared by Dr. Abran Duke office and you are no longer using narcotic pain medications  3. Monitor your weight daily.. Please use the same scale and weigh at same time... If you gain 5-10 lbs in 48 hours with associated lower extremity swelling, please contact our office at [phone]  4. Fever of 101.5 for at least 24 hours with no source, please contact our office at [phone]  5. Activity- up as tolerated, please walk at least 3 times per day.  Avoid strenuous activity, no lifting, pushing, or pulling with your arms over 8-10 lbs for a minimum of 6 weeks  6. If any questions or concerns arise, please do not hesitate to contact our office at [phone]   Dysphagia Eating Plan, Pureed This eating plan helps people who are not able to bite or chew food. It is also for people who do not have good control of their tongues. Pureed foods are smooth. They are prepared without lumps so that they can be swallowed safely. Work with your health care provider, nutrition and diet specialist (dietitian), or your speech-language pathologist to make sure you are following the eating plan safely and getting all the nutrients you need. What are tips for following this plan? Cooking If a food is not originally a smooth texture, you may be able to eat the food after: Pureeing it. This can be done with a blender. Moistening it. This can be done by adding juice, cooking liquid, gravy, or sauce to a dry food and then pureeing it. For example, you may have bread if you soak it in milk and puree it. If a food is too thin, you may  add a commercial thickener, corn starch, rice cereal, or potato flakes to thicken it. Strain and throw away any excess liquid or liquid that separates from a solid pureed food before eating. Strain lumps, chunks, pulp, and seeds from pureed foods before eating. Reheat foods slowly to prevent a tough crust from forming. Meal planning Eat a variety of foods to get all the nutrients you need. Add dry milk or protein powder to food to increase calories and protein content. Follow your meal plan as told by your dietitian. General information You may eat foods that are soft and have a pudding-like texture. Do not eat foods that you have to chew. If you have to chew the food, then you cannot eat it. Avoid foods that are hard, dry, sticky, chunky, lumpy, or stringy. Also avoid foods with nuts, seeds, raisins, skins, or pulp. You may be instructed to thicken liquids. Follow your health care provider's instructions about how to do this and to what consistency. The foods you may eat are usually eaten with a spoon. You can use a spoon or fork to test the texture of a food: Using a spoon, you can do a spoon tilt test to check if food holds together on the spoon and is not too firm, too sticky, or too thick. Food should hold its shape on the spoon and slide off easily with almost no food left on the spoon. With food on a  fork, the food should sit on a mound or pile on top of the fork and should not seep or drip through the fork prongs continuously. What foods should I eat?  Fruits Pureed fruits such as melons and apples without seeds or pulp. Mashed bananas. Mashed avocado. Fruit juices without pulp or seeds. Vegetables Pureed vegetables. Smooth tomato paste or sauce. Mashed or pureed potatoes without skin. Grains Soft breads, pancakes, [place] toast, muffins, and bread stuffing pureed to a smooth, moist texture, without nuts or seeds. Cooked cereals that have a pudding-like consistency, such as hot wheat  cereal or farina. Pureed oatmeal. Pureed, well-cooked pasta and rice. Meats and other proteins Pureed meat, poultry, and fish. Smooth pate or liverwurst. Smooth souffles. Pureed beans such as lentils. Pureed eggs. Smooth nut and seed butters. Pureed tofu. Dairy Yogurt. Milk. Pureed cottage cheese. Nutritional dairy drinks or shakes. Cream cheese. Smooth pudding, ice cream, sherbet, and malts. Fats and oils Butter. Margarine. Vegetable oils. Smooth and strained gravy. Sour cream. Mayonnaise. Smooth sauces such as white sauce, cheese sauce, or hollandaise sauce. Sweets and desserts Moistened and pureed cookies and cakes. Whipped topping. Gelatin. Pudding pops. Seasonings and other foods Finely ground spices. Jelly. Honey. Pureed casseroles. Strained soups. Pureed sandwiches. Beverages Anything prepared at the consistency recommended by your health care provider. The items listed above may not be a complete list of foods and beverages you can eat. Contact a dietitian for more information. What foods should I avoid? Fruits Whole fresh, frozen, canned, or dried fruits that have not been pureed. Stringy fruits, such as pineapple or coconut. Watermelon with seeds. Dried fruit or fruit leather. Vegetables Whole vegetables. Stringy vegetables such as celery. Tomatoes or tomato sauce with seeds. Fried vegetables. Grains Oatmeal. Dry cereals. Hard breads. Breads with seeds or nuts. Whole pasta, rice, or other grains. Whole pancakes, waffles, biscuits, muffins, or rolls. Meats and other proteins Whole or ground meat, fish, or poultry. Dried or cooked lentils or legumes that have been cooked but not mashed or pureed. Non-pureed eggs. Nuts and seeds. Crunchy peanut butter. Whole tofu or other meat alternatives. Dairy Cheese cubes or slices. Non-pureed cottage cheese. Yogurt with fruit chunks. Fats and oils All fats and sauces that have lumps or chunks. Sweets and desserts Solid desserts. Sticky, chewy  sweets such as licorice and caramel. Candy with nuts or coconut. Seasonings and other foods Coarse or seeded herbs and spices. Chunky preserves. Jams with seeds. Whole sandwiches. Non-pureed casseroles. Chunky soups. The items listed above may not be a complete list of foods and beverages you should avoid. Contact a dietitian for more information. Summary Pureed foods can be helpful for those who have difficulty chewing or problems controlling or moving food with their tongues. On this dysphagia eating plan, you may eat foods that are soft and have a pudding-like texture. Do not eat foods that you have to chew. If you have to chew the food, then you cannot eat it. You may be instructed to thicken liquids. Follow your health care provider's instructions about how to do this and to what consistency. This information is not intended to replace advice given to you by your health care provider. Make sure you discuss any questions you have with your health care provider. Document Revised: 11/04/2021 Document Reviewed: 11/04/2021 Elsevier Patient Education  [place].

## 2022-05-12 NOTE — Op Note (Signed)
[address]       [place] [address]             [id number]        05/12/2022  Patient:  [name] Pre-Op Dx: Recurrent hiatal hernia Post-op Dx: Same Procedure: - Esophagoscopy - Robotic assisted laparoscopy - Redo hiatal hernia repair with felt pledgets. -Gastropexy   Surgeon and Role:      * Emilio Baylock, Lucile Crater, MD - Primary  Assistant: Macarthur Critchley, PA-C  An experienced assistant was required given the complexity of this surgery and the standard of surgical care. The assistant was needed for exposure, dissection, suctioning, retraction of delicate tissues and sutures, instrument exchange and for overall help during this procedure.   Anesthesia  general EBL: 13 ml Blood Administration: None Specimen: None   Counts: correct   Indications: 71yo female with recurrent hiatal hernia and reticular pulmonary findings concerning for aspiration.  We discussed the possibility of undergoing a redo hiatal hernia repair.  She is agree to proceed but is unsure of the date.  She will call us back to schedule her procedure.  She will require and esophagogastroscopy, and robotic assisted hiatal hernia repair.  Findings: The previous biologic mesh was well incorporated.  It did appear as though hiatal sutures had pulled through.  Her stomach was herniated into her mediastinum.  We were able to mobilize the stomach back down into the abdominal cavity.  Felt pledgets were used to reapproximate the hiatal defect.  Operative Technique: After the risks, benefits and alternatives were thoroughly discussed, the patient was brought to the operative theatre.  Anesthesia was induced, and the esophagoscope was passed through the oropharynx down to the stomach.  The scope was retroflexed and the hiatal hernia was clearly evident.  The scope was pulled back and the mucosal surface of the esophagus was visualized.    The scope was then parked at 25 cm from the incisors.   The patient was then prepped and draped in normal sterile fashion.  An appropriate surgical pause was performed, and pre-operative antibiotics were dosed accordingly.  We began with a 1 cm incision 15 cm caudad from the xiphoid and slightly lateral to the umbilicus.  Using an Optiview we entered the peritoneal space.  The abdomen was then insufflated with CO2.  3 other robotic ports were placed to triangulate the hiatus.  Another 12 mm port was placed in place at the level of the umbilicus laterally for an assistant port and another 5 mm trocar was placed in the right lower quadrant for liver retractor.  The patient was then placed in steep reverse Trendelenburg and the liver was elevated to expose the esophageal hiatus.  And then the robot was docked.  We mobilized the stomach out of the mediastinum with careful dissection.  The mesh was well incorporated, but there appeared to be several sutures posterior to the esophagus that a pull-through.  Once we had achieved good intra-abdominal esophageal length we began with our crural repair.  Pledgeted sutures were used in an interrupted fashion.  4 posterior sutures were required.  Prior to placing the last few stitches the EGD was passed down into the stomach to ensure that we did not create a narrowing of the esophagus.  After this was completed the stomach was then pexied to the abdominal wall intracorporeally.  An air leak test was performed using the gastroscope.  No leak was evident.  The liver retractor was  removed and all ports were removed under direct visualization.  The skin and soft tissue were closed with absorbable suture    The patient tolerated the procedure without any immediate complications, and was transferred to the PACU in stable condition.  Venba Zenner Bary Leriche

## 2022-05-12 NOTE — Transfer of Care (Signed)
Immediate Anesthesia Transfer of Care Note  Patient: Torina A Wernick  Procedure(s) Performed: XI ROBOTIC ASSISTED HIATAL HERNIA REPAIR (Abdomen) ESOPHAGOGASTRODUODENOSCOPY (EGD) (Abdomen) GASTROPEXY (Abdomen)  Patient Location: PACU  Anesthesia Type:General  Level of Consciousness: awake and drowsy  Airway & Oxygen Therapy: Patient Spontanous Breathing  Post-op Assessment: Report given to RN, Post -op Vital signs reviewed and stable and Patient moving all extremities X 4  Post vital signs: Reviewed and stable  Last Vitals:  Vitals Value Taken Time  BP 186/104 05/12/22 1100  Temp    Pulse 78 05/12/22 1101  Resp 14 05/12/22 1101  SpO2 90 % 05/12/22 1101  Vitals shown include unvalidated device data.  Last Pain:  Vitals:   05/12/22 0609  TempSrc:   PainSc: 0-No pain         Complications: No notable events documented.

## 2022-05-12 NOTE — Discharge Summary (Signed)
Physician Discharge Summary  Patient ID: [name] MRN: [id number] DOB/AGE: 71-24-1952 71 y.o.  Admit date: 05/12/2022 Discharge date: 05/13/2022  Admission Diagnoses:  Paraesophageal hernia, recurrent Dyslipidemia Hypertension Peripheral vascular disease Carotid artery disease   Discharge Diagnoses:   Paraesophageal hernia, recurrent Dyslipidemia Hypertension Peripheral vascular disease Carotid artery disease S/P robot-assisted surgical procedure   Discharged Condition: stable  History of Present Illness:     71 year old female presents in follow-up to discuss results of her cross-sectional imaging.  She is status post paraesophageal hernia repair several months ago but unfortunately developed a recurrence with a moderate-sized hernia.  Her biggest complaint has been respiratory in nature which is likely due to silent aspiration from her hiatal hernia. Cross-sectional imaging of the chest was obtained showing recurrent hiatal hernia and reticular pulmonary findings concerning for aspiration.  We discussed the possibility of undergoing a redo hiatal hernia repair.  She  has agreed to proceed.  She will require and esophagogastroscopy, and robotic assisted hiatal hernia repair.  Hospital Course: [name] was admitted for elective surgery on 05/12/2022.  She was taken to the operative room where robotic assisted care of the paraesophageal hernia was carried out along with gastropexy.  Following the procedure, she was extubated and recovered in the postanesthesia care unit.  She was transferred to [facility].  She was kept NPO.  On the morning of the first postoperative day and esophagram was obtained which showed slow transition of contrast through the gastroesophageal junction and no leak.  A dysphagia diet was initiated and tolerated. She was able to ambulate without difficulty. She was felt to be ready for discharge to home on post-op day 1.   Consults:  None  Significant Diagnostic Studies:   CLINICAL DATA:  1 day s/p hiatal hernia repair revision   EXAM: ESOPHAGUS/BARIUM SWALLOW/TABLET STUDY   TECHNIQUE: Single contrast examination was performed using water soluble contrast. This exam was performed by Pasty Spillers, PA-C, and was supervised and interpreted by Macy Mis, MD.   FLUOROSCOPY: Radiation Exposure Index (as provided by the fluoroscopic device): 22.70 mGy Kerma   COMPARISON:  None Available.   FINDINGS: Contrast is swallowed without difficulty and it quickly traverses the esophagus. There is prompt partial passage of contrast through the GE junction with some retrograde motion and tertiary contractions. Decreased caliber at the gastroesophageal junction may reflect postoperative edema. No contrast extravasation. No hiatal hernia.   IMPRESSION: No evidence of leak or obstruction. Narrowing at the gastroesophageal junction presumably reflects postoperative edema with some associated dysmotility and retrograde movement of contrast.     Electronically Signed   By: Macy Mis M.D.   On: 05/13/2022 14:23  Treatments: Surgery  05/12/2022: Robotic assisted repair of paraesophageal hernia with gastropexy by Dr. Kipp Brood  Discharge Exam: Blood pressure 107/65, pulse 77, temperature 98.3 F (36.8 C), temperature source Oral, resp. rate 18, height '5\' 4"'$  (1.626 m), weight 61.2 kg, SpO2 97 %.  General appearance: alert, cooperative, and no distress Neurologic: intact Heart: regular rate and rhythm Lungs: breath sounds clear, good sats on RA. Abdomen: soft, minimal incisional tenderness. Wound: port incisions are all intact and dry.  Disposition:   Discharged home in stable condition   Allergies as of 05/13/2022       Reactions   Doxycycline Shortness Of Breath, Swelling   Lips swelling and short of breath   Demerol Hcl [meperidine] Nausea And Vomiting   Fentanyl Nausea And Vomiting   Can tolerate  with nausea meds  Pt states all pain medications cause N/V for her   Fluocinolone Nausea And Vomiting   Penicillins Hives   Breo Ellipta [fluticasone Furoate-vilanterol] Other (See Comments)   Lower extremity weakness, lightheadedness    Imdur [isosorbide Nitrate] Nausea And Vomiting        Medication List     STOP taking these medications    sulfamethoxazole-trimethoprim 800-160 MG tablet Commonly known as: BACTRIM DS       TAKE these medications    acetaminophen 325 MG tablet Commonly known as: TYLENOL Take 2 tablets (650 mg total) by mouth every 6 (six) hours as needed for mild pain or moderate pain.   albuterol 108 (90 Base) MCG/ACT inhaler Commonly known as: VENTOLIN HFA Inhale 2 puffs into the lungs every 6 (six) hours as needed for wheezing or shortness of breath.   atorvastatin 80 MG tablet Commonly known as: LIPITOR TAKE ONE TABLET BY MOUTH DAILY   CALCIUM 1200+D3 PO Take 1 tablet by mouth daily.   ciclopirox 8 % solution Commonly known as: Penlac Apply topically at bedtime. Apply over nail and surrounding skin. Apply daily over previous coat. After seven (7) days, may remove with alcohol and continue cycle.   clopidogrel 75 MG tablet Commonly known as: PLAVIX Take 1 tablet (75 mg total) by mouth daily.   diazepam 2 MG tablet Commonly known as: VALIUM Take 1 tablet (2 mg total) by mouth 2 (two) times daily. What changed:  how much to take when to take this   docusate sodium 100 MG capsule Commonly known as: COLACE Take 100 mg by mouth daily.   estradiol 0.1 MG/GM vaginal cream Commonly known as: ESTRACE Place 1 Applicatorful vaginally 2 (two) times a week.   ezetimibe 10 MG tablet Commonly known as: ZETIA TAKE ONE TABLET BY MOUTH EVERY DAY   famotidine 20 MG tablet Commonly known as: PEPCID Take 40 mg by mouth 2 (two) times daily.   fexofenadine 180 MG tablet Commonly known as: ALLEGRA Take 180 mg by mouth daily as needed for  allergies.   fluticasone 50 MCG/ACT nasal spray Commonly known as: FLONASE Place 2 sprays into both nostrils daily. What changed: when to take this   gabapentin 300 MG capsule Commonly known as: NEURONTIN Take 1 capsule (300 mg total) by mouth at bedtime.   lisinopril 5 MG tablet Commonly known as: ZESTRIL Take 1 tablet (5 mg total) by mouth daily.   Magnesium 500 MG Caps Take 500 mg by mouth daily.   nitroGLYCERIN 0.4 MG SL tablet Commonly known as: NITROSTAT Place 1 tablet (0.4 mg total) under the tongue every 5 (five) minutes as needed for chest pain.   ondansetron 4 MG disintegrating tablet Commonly known as: ZOFRAN-ODT Take 1 tablet (4 mg total) by mouth every 6 (six) hours as needed for nausea or vomiting.   PROBIOTIC DAILY PO Take 1 capsule by mouth daily.   Spiriva Respimat 1.25 MCG/ACT Aers Generic drug: Tiotropium Bromide Monohydrate Inhale 2 puffs into the lungs daily.   tolterodine 4 MG 24 hr capsule Commonly known as: DETROL LA Take 4 mg by mouth daily.   traMADol 50 MG tablet Commonly known as: Ultram Take 1 tablet (50 mg total) by mouth every 6 (six) hours as needed for up to 5 days.        Follow-up Information     [name], MD. Call on 05/20/2022.   Specialty: Cardiothoracic Surgery Why: This is a virtual (telephone call) visit with Dr. Kipp Brood.  He will call you at around 2:30 PM.  You do not need to go to the office. Contact information: [address] [place] [address] [phone]                   Signed: Antony Odea, PA-C 05/13/2022, 2:37 PM

## 2022-05-12 NOTE — Hospital Course (Addendum)
History of Present Illness:     71 year old female presents in follow-up to discuss results of her cross-sectional imaging.  She is status post paraesophageal hernia repair several months ago but unfortunately developed a recurrence with a moderate-sized hernia.  Her biggest complaint has been respiratory in nature which is likely due to silent aspiration from her hiatal hernia. Cross-sectional imaging of the chest was obtained showing recurrent hiatal hernia and reticular pulmonary findings concerning for aspiration.  We discussed the possibility of undergoing a redo hiatal hernia repair.  She  has agreed to proceed.  She will require and esophagogastroscopy, and robotic assisted hiatal hernia repair.  Hospital Course: [name] was admitted for elective surgery on 05/12/2022.  She was taken to the operative room where robotic assisted care of the paraesophageal hernia was carried out along with gastropexy.  Following the procedure, she was extubated and recovered in the postanesthesia care unit.  She was transferred to [facility].  She was kept NPO.  On the morning of the first postoperative day and esophagram was obtained which showed slow passage of contrast through the GE junction and no leak.  A dysphagia diet was initiated.

## 2022-05-13 ENCOUNTER — Encounter (HOSPITAL_COMMUNITY): Payer: Self-pay | Admitting: Thoracic Surgery (Cardiothoracic Vascular Surgery)

## 2022-05-13 ENCOUNTER — Inpatient Hospital Stay (HOSPITAL_COMMUNITY): Payer: PPO

## 2022-05-13 LAB — CBC
HCT: 35.5 % — ABNORMAL LOW (ref 36.0–46.0)
Hemoglobin: 12.3 g/dL (ref 12.0–15.0)
MCH: 32.4 pg (ref 26.0–34.0)
MCHC: 34.6 g/dL (ref 30.0–36.0)
MCV: 93.4 fL (ref 80.0–100.0)
Platelets: 186 10*3/uL (ref 150–400)
RBC: 3.8 MIL/uL — ABNORMAL LOW (ref 3.87–5.11)
RDW: 12.9 % (ref 11.5–15.5)
WBC: 9.5 10*3/uL (ref 4.0–10.5)
nRBC: 0 % (ref 0.0–0.2)

## 2022-05-13 LAB — BASIC METABOLIC PANEL
Anion gap: 5 (ref 5–15)
BUN: 10 mg/dL (ref 8–23)
CO2: 26 mmol/L (ref 22–32)
Calcium: 8.4 mg/dL — ABNORMAL LOW (ref 8.9–10.3)
Chloride: 109 mmol/L (ref 98–111)
Creatinine, Ser: 0.72 mg/dL (ref 0.44–1.00)
GFR, Estimated: >60 mL/min (ref >60)
Glucose, Bld: 98 mg/dL (ref 70–99)
Potassium: 3.9 mmol/L (ref 3.5–5.1)
Sodium: 140 mmol/L (ref 135–145)

## 2022-05-13 MED ORDER — ACETAMINOPHEN 325 MG PO TABS
650.0000 mg | ORAL_TABLET | Freq: Four times a day (QID) | ORAL | Status: AC | PRN
Start: 2022-05-13 — End: ?

## 2022-05-13 MED ORDER — IOHEXOL 300 MG/ML  SOLN
50.0000 mL | Freq: Once | INTRAMUSCULAR | Status: AC | PRN
Start: 2022-05-13 — End: 2022-05-13
  Administered 2022-05-13: 50 mL via ORAL

## 2022-05-13 MED ORDER — TRAMADOL HCL 50 MG PO TABS: 50.0000 mg | ORAL_TABLET | Freq: Four times a day (QID) | ORAL | 0 refills | Status: AC | PRN

## 2022-05-13 NOTE — Progress Notes (Addendum)
      [address]       [place] [address]             [phone]      1 Day Post-Op Procedure(s) (LRB): XI ROBOTIC ASSISTED HIATAL HERNIA REPAIR (N/A) ESOPHAGOGASTRODUODENOSCOPY (EGD) (N/A) GASTROPEXY (N/A) Subjective: Awake and alert, says she is comfortable and is hungry.  Awaiting esophagram.   Objective: Vital signs in last 24 hours: Temp:  [97.1 F (36.2 C)-98.6 F (37 C)] 97.9 F (36.6 C) (08/18 0416) Pulse Rate:  [74-84] 82 (08/18 0416) Cardiac Rhythm: Normal sinus rhythm (08/17 1921) Resp:  [11-21] 18 (08/18 0416) BP: (93-186)/(49-104) 93/49 (08/18 0416) SpO2:  [92 %-100 %] 97 % (08/18 0416) Arterial Line BP: (169-192)/(78-90) 192/81 (08/17 1245)      Intake/Output from previous day: 08/17 0701 - 08/18 0700 In: 1800 [I.V.:1800] Out: 485 [Urine:460; Blood:25] Intake/Output this shift: No intake/output data recorded.  General appearance: alert, cooperative, and no distress Neurologic: intact Heart: regular rate and rhythm Lungs: breath sounds clear, good sats on RA. Abdomen: soft, minimal incisional tenderness. Wound: port incisions are all intact and dry.   Lab Results: Recent Labs    05/10/22 1359 05/13/22 0600  WBC 7.0 9.5  HGB 14.8 12.3  HCT 44.5 35.5*  PLT 245 186   BMET:  Recent Labs    05/10/22 1359 05/13/22 0600  NA 140 140  K 3.8 3.9  CL 105 109  CO2 28 26  GLUCOSE 98 98  BUN 11 10  CREATININE 0.71 0.72  CALCIUM 10.2 8.4*    PT/INR:  Recent Labs    05/10/22 1359  LABPROT 12.7  INR 1.0   ABG    Component Value Date/Time   PHART 7.425 01/14/2021 1356   HCO3 23.4 01/14/2021 1356   ACIDBASEDEF 0.5 01/14/2021 1356   O2SAT 97.5 01/14/2021 1356   CBG (last 3)  No results for input(s): "GLUCAP" in the last 72 hours.  Assessment/Plan: S/P Procedure(s) (LRB): XI ROBOTIC ASSISTED HIATAL HERNIA REPAIR (N/A) ESOPHAGOGASTRODUODENOSCOPY (EGD) (N/A) GASTROPEXY (N/A)  -POD1 repair of recurrent  paraesophageal hernia. Will begin dysphagia diet if swallow study OK. Possible discharge to home later today.    LOS: 1 day    Antony Odea, PA-C 05/13/2022   Agree with above. Advance to dysphagia 1 diet. Home today.  Vania Rosero Bary Leriche

## 2022-05-13 NOTE — TOC Transition Note (Signed)
Transition of Care [facility]) - CM/SW Discharge Note   Patient Details  Name: [name] MRN: [id number] Date of Birth: [birth year]  [facility] CM/SW Contact:  Angelita Ingles, RN Phone [phone]  05/13/2022, 3:33 PM   Clinical Narrative:    Patient with discharge orders. No TOC needs noted. Patient is not code 44 per Utilization review nurse.          Patient Goals and CMS Choice        Discharge Placement                       Discharge Plan and Services                                     Social Determinants of Health (SDOH) Interventions     Readmission Risk Interventions     No data to display

## 2022-05-13 NOTE — Anesthesia Postprocedure Evaluation (Signed)
Anesthesia Post Note  Patient: [name]  Procedure(s) Performed: XI ROBOTIC ASSISTED HIATAL HERNIA REPAIR (Abdomen) ESOPHAGOGASTRODUODENOSCOPY (EGD) (Abdomen) GASTROPEXY (Abdomen)     Patient location during evaluation: PACU Anesthesia Type: General Level of consciousness: sedated and patient cooperative Pain management: pain level controlled Vital Signs Assessment: post-procedure vital signs reviewed and stable Respiratory status: spontaneous breathing Cardiovascular status: stable Anesthetic complications: no   No notable events documented.  Last Vitals:  Vitals:   05/13/22 0800 05/13/22 1245  BP: (!) 112/54 107/65  Pulse: 77   Resp: 15 18  Temp: 36.6 C 36.8 C  SpO2: 97%     Last Pain:  Vitals:   05/13/22 1658  TempSrc:   PainSc: 0-No pain                 Nolon Nations

## 2022-05-13 NOTE — Progress Notes (Signed)
Mobility Specialist Progress Note    05/13/22 1116  Mobility  Activity Ambulated independently in hallway  Level of Assistance Independent  Assistive Device None  Distance Ambulated (ft) 800 ft  Activity Response Tolerated well  $Mobility charge 1 Mobility   Pre-Mobility: 93 HR, 99% SpO2 During Mobility: 104 HR Post-Mobility: 78 HR  Pt received in bed and agreeable. No complaints on walk. Returned to bed with call bell in reach.    Hildred Alamin Mobility Specialist

## 2022-05-13 NOTE — Plan of Care (Signed)

## 2022-05-13 NOTE — Progress Notes (Signed)
RN went over d/c summary w/ pt. Belongings w/ pt and her daughter. SWOT RN already removed IV access. NT transporting pt to private vehicle where pt's daughter will transport pt home

## 2022-05-13 NOTE — Progress Notes (Signed)
Discharge instructions (including medications) discussed with and copy provided to patient/caregiver 

## 2022-05-16 ENCOUNTER — Telehealth: Payer: Self-pay | Admitting: *Deleted

## 2022-05-16 ENCOUNTER — Encounter: Payer: Self-pay | Admitting: *Deleted

## 2022-05-16 NOTE — Patient Outreach (Signed)
  Care Coordination [facility] Note Transition Care Management Follow-up Telephone Call Date of discharge and from where: 05/13/22 [facility] How have you been since you were released from the hospital? "I've been fine, much better" Any questions or concerns? No  Items Reviewed: Did the pt receive and understand the discharge instructions provided? Yes  Medications obtained and verified? Yes  Other? No  Any new allergies since your discharge? No  Dietary orders reviewed? Yes Do you have support at home? Yes   Home Care and Equipment/Supplies: Were home health services ordered? no If so, what is the name of the agency? N/a  Has the agency set up a time to come to the patient's home? not applicable Were any new equipment or medical supplies ordered?  No What is the name of the medical supply agency? N/A Were you able to get the supplies/equipment? not applicable Do you have any questions related to the use of the equipment or supplies? No  Functional Questionnaire: (I = Independent and D = Dependent) ADLs: I  Bathing/Dressing- I  Meal Prep- I  Eating- I  Maintaining continence- I  Transferring/Ambulation- I  Managing Meds- I  Follow up appointments reviewed:  PCP Hospital f/u appt confirmed? No . Message sent to [place] to coordinate appointment if needed [facility] f/u appt confirmed? Yes  Scheduled to see Dr Kipp Brood on 05/20/22 @ 2:30 (virtual visit). Are transportation arrangements needed? No  If their condition worsens, is the pt aware to call PCP or go to the Emergency Dept.? Yes Was the patient provided with contact information for the PCP's office or ED? Yes Was to pt encouraged to call back with questions or concerns? Yes  SDOH assessments and interventions completed:   Yes  Care Coordination Interventions Activated:  Yes   Care Coordination Interventions:  PCP follow up appointment requested    Encounter Outcome:  Pt. Visit  Completed    Chong Sicilian, BSN, RN-BC RN Care Coordinator Direct Dial: [phone]

## 2022-05-18 NOTE — Telephone Encounter (Signed)
No f/u needed per PCP

## 2022-05-20 ENCOUNTER — Ambulatory Visit (INDEPENDENT_AMBULATORY_CARE_PROVIDER_SITE_OTHER): Payer: Self-pay | Admitting: Thoracic Surgery (Cardiothoracic Vascular Surgery)

## 2022-05-20 DIAGNOSIS — K449 Diaphragmatic hernia without obstruction or gangrene: Secondary | ICD-10-CM

## 2022-05-20 NOTE — Progress Notes (Signed)
     [address]       [place] [address]             [phone]       Patient: Home Provider: Office Consent for Telemedicine visit obtained.  Today's visit was completed via a real-time telehealth (see specific modality noted below). The patient/authorized person provided oral consent at the time of the visit to engage in a telemedicine encounter with the present provider at [facility]. The patient/authorized person was informed of the potential benefits, limitations, and risks of telemedicine. The patient/authorized person expressed understanding that the laws that protect confidentiality also apply to telemedicine. The patient/authorized person acknowledged understanding that telemedicine does not provide emergency services and that he or she would need to call 911 or proceed to the nearest hospital for help if such a need arose.   Total time spent in the clinical discussion 10 minutes.  Telehealth Modality: Phone visit (audio only)  I had a telephone visit with Mrs. [name].  Overall she is doing well.  She does complain of some loose stools.  She denies any dysphagia or reflux.  Her cough is also improved.  I have advanced her diet.  She will follow-up in 1 month with a chest x-ray.

## 2022-06-08 ENCOUNTER — Ambulatory Visit (INDEPENDENT_AMBULATORY_CARE_PROVIDER_SITE_OTHER): Payer: PPO | Admitting: Pulmonary Disease

## 2022-06-08 ENCOUNTER — Encounter: Payer: Self-pay | Admitting: Pulmonary Disease

## 2022-06-08 VITALS — BP 110/64 | HR 94 | Ht 64.0 in | Wt 132.6 lb

## 2022-06-08 DIAGNOSIS — Z87891 Personal history of nicotine dependence: Secondary | ICD-10-CM

## 2022-06-08 DIAGNOSIS — K449 Diaphragmatic hernia without obstruction or gangrene: Secondary | ICD-10-CM | POA: Diagnosis not present

## 2022-06-08 DIAGNOSIS — J432 Centrilobular emphysema: Secondary | ICD-10-CM

## 2022-06-08 DIAGNOSIS — K219 Gastro-esophageal reflux disease without esophagitis: Secondary | ICD-10-CM | POA: Diagnosis not present

## 2022-06-08 DIAGNOSIS — I7300 Raynaud's syndrome without gangrene: Secondary | ICD-10-CM | POA: Diagnosis not present

## 2022-06-08 NOTE — Progress Notes (Signed)
Synopsis: Referred in Feb 2022 for DOE, former patient of Dr. Tamala Julian, PCP: by Tammi Sou, MD  Subjective:   PATIENT ID: [name] GENDER: female DOB: 12/07/1950, MRN: [id number]  Chief Complaint  Patient presents with   Follow-up    This 71 year old female, former smoker, history of gastroesophageal reflux, moderate hiatal hernia, cough, stroke, emphysema on lung cancer screening CT however no obstruction on PFTs.  Patient presents today with ongoing shortness of breath.  Was seen by Dr. Tamala Julian in 2020.  Patient was not interested at the time of having hiatal hernia evaluated due to concern for an open repair.  She did meet with general surgery at one point in time.  She follows with Dr. Loletha Grayer from gastroenterology.  OV 02/25/2022: Here today for follow-up regarding shortness of breath.Last office visit we reviewed her spirometry which was reassuring no evidence of obstructive disease.  She does have evidence of his emphysema on CT imaging however.  Remain on Spiriva Respimat was given samples.  Was referred to cardiothoracic surgery for consideration of hiatal hernia repair.  Patient underwent paraesophageal hernia repair by Dr. Kipp Brood in April 2022.  Here today for follow-up after recent low-dose lung cancer screening CT that showed some basilar subpleural reticular opacities.  OV 06/08/2022: Here today for follow-up.  Saw Dr. Chase Caller in clinic for evaluation of some abnormality that was seen on lung cancer screening CT.  She did have a rheumatologic work-up that showed a positive anti-SS-A antibody.  She was referred to rheumatology.  She also was seen by Dr. Kipp Brood for consideration of repeat surgery for her hiatal hernia because the first 1 failed.She is actually taken on 05/12/2022 to the operating room for repeat hiatal hernia repair.  Last office visit with Dr. Kipp Brood on 05/20/2022, office note reviewed.  She has a follow-up with them in 1 month.Patient had pulmonary function  test completed in July 2023 which showed a normal ratio normal FEV1 and FVC.  Normal total lung capacity normal DLCO.  She was on Spiriva in the past for management of her COPD she does have emphysema on her CT scan but no evidence obstruction on PFTs.  She still has some tightness around her epigastrium that she thinks related to having surgery on the diaphragm.  She feels like its getting a little bit better slowly.        Past Medical History:  Diagnosis Date   Abdominal bruit 04/2017   Aortic u/s showed NO ANEURISM.   Anemia    low iron   Anxiety    Carotid artery occlusion    hx L carotid stent   Cataract    COPD (chronic obstructive pulmonary disease) ([place])    DOE, worse since 11/2018.  Stress test neg 01/2019.  Albut x 1 inh prn helpful some.  Pulm 06/2019, PFTs not diag of COPD, DOE likely from deconditioning.   Coronary artery disease    Essential hypertension, benign 05/22/2014   GERD (gastroesophageal reflux disease)    + hx of esophagitis   [name] syndrome    H/O hiatal hernia 02/2019   EGD-->moderate size->surgery recommended but pt declined.   Hematochezia 2020/21   internal hemorrhoids   History of iron deficiency anemia 07/2019   Dr. Bryan Lemma EGD and colonoscopy, some anorectal muscosal ulceration, o/w just mild chronic gastritis.   Also could be poor absorption due to chronic high dose acid suppression. Hb/iron normalized aft iron infus and ongoing oral iron.   History of kidney stones  one time   ILD (interstitial lung disease) ([place])    Detected on lung cancer screening CT 01/2022.  ILD blood panel all normal except weakly + myositis ab's.  Pt to be evaluated by Dr. Chase Caller asymptomatic as of 03/2022.   Mitral valve prolapse    Mixed hyperlipidemia 05/22/2014   OAB (overactive bladder)    detrol LA from urol helpful   Osteopenia 06/06/2016   05/2016 DEXA T-score -1.5.  06/2018 DEXA T score -1.1.  T score -1.2 06/2021   Peripheral vascular disease,  unspecified ([place]) 05/22/2014   innominate and carotid.  U/S f/u 12/2016 showed no signif change compared to 2017--vascular recommended repeat 1 yr.  Doing well as of 08/2018 cardiol f/u-->continued on ASA and Plavix. F/u 08/2019->ASA d/c'd, plavix continued.   Prediabetes 2018   2018 A1c 6.3%.  05/2018 A1c 6.1%. 07/2019 A1c 6.4%.   Raynaud disease    Seasonal allergies    TIA (transient ischemic attack) 2006     Family History  Problem Relation Age of Onset   Heart attack Mother    CAD Mother    Heart disease Mother        Before age 58   Hypertension Mother    Hyperlipidemia Mother    Hypertension Father    Pneumonia Father    Hyperlipidemia Father    Heart murmur Sister    Osteoporosis Paternal Grandmother    CAD Paternal Grandfather    Breast cancer Neg Hx    Colon cancer Neg Hx    Esophageal cancer Neg Hx    Rectal cancer Neg Hx    Stomach cancer Neg Hx    Colon polyps Neg Hx      Past Surgical History:  Procedure Laterality Date   ABDOMINAL HYSTERECTOMY  1997   CARDIAC CATHETERIZATION  01/2012   NORMAL   CARDIOVASCULAR STRESS TEST  02/05/2019   NORMAL.  EF normal.   CAROTID ENDARTERECTOMY  08/21/2007   right - Dr. Amedeo Plenty   CHOLECYSTECTOMY  2000   COLONOSCOPY  approx 2008; 09/23/19   2008 (Dr. Lenon Ahmadi pt->normal.  08/2019 adenoma, some friable areas of colon + anorectal mucosa inflam w/ ulceration, +diverticulosis. Recall 5 yrs   DEXA  06/06/2016   05/2016 T-score -1.5.  06/2018 T score -1.1.  T score -1.2 06/2021.   ESOPHAGOGASTRODUODENOSCOPY  03/11/2019; 09/23/19   02/2019; 5 cm hiatal hernia; GI suggested surgical repair due to intractable GER.  08/2019 mild chronic gastritis, h pylori neg, multiple benign bx's including duodenum neg for celiac changes.   ESOPHAGOGASTRODUODENOSCOPY N/A 01/18/2021   Procedure: ESOPHAGOGASTRODUODENOSCOPY (EGD);  Surgeon: [name], MD;  Location: [facility];  Service: Thoracic;  Laterality: N/A;   ESOPHAGOGASTRODUODENOSCOPY  N/A 05/12/2022   Procedure: ESOPHAGOGASTRODUODENOSCOPY (EGD);  Surgeon: [name], MD;  Location: [facility];  Service: Thoracic;  Laterality: N/A;   Incision and drainage of left thenar space abscess  02/23/2015   s/p cat bite (Dr. Amedeo Plenty)   innominate stent  2007   12/2014 u/s showed patent stent in innominate and L common carotid.  L prox int carotid 40-59% stenosis   LDCT for lung ca screening  06/2019   NEG->rpt 1 yr   Left common carotid stent  2006 and 2007   "                    "                  "                          "                             "  PEXY N/A 05/12/2022   Procedure: GASTROPEXY;  Surgeon: [name], MD;  Location: [facility];  Service: Thoracic;  Laterality: N/A;   TRANSTHORACIC ECHOCARDIOGRAM  January 01, 2006   NORMAL   TUBAL LIGATION     US CAROTID DOPPLER BILATERAL ([facility])  06/2019   left stent w/out stenosis.  R ICA sten 40-50% obst-stable.  07/20/21 R clear, L 40-59%, normal vert and subclav flow.   XI ROBOTIC ASSISTED HIATAL HERNIA REPAIR N/A 05/12/2022   Procedure: XI ROBOTIC ASSISTED HIATAL HERNIA REPAIR;  Surgeon: [name], MD;  Location: [facility];  Service: Thoracic;  Laterality: N/A;   XI ROBOTIC ASSISTED PARAESOPHAGEAL HERNIA REPAIR N/A 01/18/2021   Procedure: XI ROBOTIC ASSISTED PARAESOPHAGEAL HERNIA REPAIR WITH FUNDOPLICATION WITH MESH;  Surgeon: [name], MD;  Location: [facility];  Service: Thoracic;  Laterality: N/A;    Social History   Socioeconomic History   Marital status: Widowed    Spouse name: Not on file   Number of children: 1   Years of education: Not on file   Highest education level: 12th grade  Occupational History   Not on file  Tobacco Use   Smoking status: Former    Packs/day: 1.25    Years: 38.00    Total pack years: 47.50    Types: Cigarettes    Quit date: 09/27/2007    Years since quitting: 14.7   Smokeless tobacco: Never  Vaping Use   Vaping Use: Never used  Substance and Sexual Activity    Alcohol use: No   Drug use: No   Sexual activity: Not on file  Other Topics Concern   Not on file  Social History Narrative   Widowed as of 01-02-2015 (husband died of viral illness while out of town in [place], [place]), has one daughter.   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   Tobacco: 20 pack-yr hx, quit 2008-01-02.   Alcohol: rare.   No hx of alc or drug problems.   Exercise: walks 2.74mles on treadmill daily.   Social Determinants of Health   Financial Resource Strain: Low Risk  (02/05/2022)   Overall Financial Resource Strain (CARDIA)    Difficulty of Paying Living Expenses: Not very hard  Food Insecurity: No Food Insecurity (02/05/2022)   Hunger Vital Sign    Worried About Running Out of Food in the Last Year: Never true    Ran Out of Food in the Last Year: Never true  Transportation Needs: No Transportation Needs (05/16/2022)   PRAPARE - THydrologist(Medical): No    Lack of Transportation (Non-Medical): No  Physical Activity: Sufficiently Active (02/05/2022)   Exercise Vital Sign    Days of Exercise per Week: 7 days    Minutes of Exercise per Session: 50 min  Stress: No Stress Concern Present (02/05/2022)   FMasthope   Feeling of Stress : Not at all  Social Connections: Socially Isolated (02/05/2022)   Social Connection and Isolation Panel [NHANES]    Frequency of Communication with Friends and Family: More than three times a week    Frequency of Social Gatherings with Friends and Family: Twice a week    Attends Religious Services: Never    AMarine scientistor Organizations: No    Attends CArchivistMeetings: Never    Marital Status: Widowed  Intimate Partner Violence: Not At Risk (10/06/2021)  Humiliation, Afraid, Rape, and Kick questionnaire    Fear of Current or Ex-Partner: No    Emotionally Abused: No    Physically  Abused: No    Sexually Abused: No     Allergies  Allergen Reactions   Doxycycline Shortness Of Breath and Swelling    Lips swelling and short of breath   Demerol Hcl [Meperidine] Nausea And Vomiting   Fentanyl Nausea And Vomiting    Can tolerate with nausea meds  Pt states all pain medications cause N/V for her    Fluocinolone Nausea And Vomiting   Penicillins Hives   Breo Ellipta [Fluticasone Furoate-Vilanterol] Other (See Comments)    Lower extremity weakness, lightheadedness    Imdur [Isosorbide Nitrate] Nausea And Vomiting     Outpatient Medications Prior to Visit  Medication Sig Dispense Refill   fluticasone (FLONASE) 50 MCG/ACT nasal spray Place 2 sprays into both nostrils daily. (Patient taking differently: Place 2 sprays into both nostrils at bedtime.) 16 g 6   Tiotropium Bromide Monohydrate (SPIRIVA RESPIMAT) 1.25 MCG/ACT AERS Inhale 2 puffs into the lungs daily. 4 g 3   acetaminophen (TYLENOL) 325 MG tablet Take 2 tablets (650 mg total) by mouth every 6 (six) hours as needed for mild pain or moderate pain.     albuterol (VENTOLIN HFA) 108 (90 Base) MCG/ACT inhaler Inhale 2 puffs into the lungs every 6 (six) hours as needed for wheezing or shortness of breath. (Patient not taking: Reported on 06/08/2022) 8 g 6   atorvastatin (LIPITOR) 80 MG tablet TAKE ONE TABLET BY MOUTH DAILY 90 tablet 3   Calcium-Magnesium-Vitamin D (CALCIUM 1200+D3 PO) Take 1 tablet by mouth daily.     clopidogrel (PLAVIX) 75 MG tablet Take 1 tablet (75 mg total) by mouth daily. 90 tablet 1   diazepam (VALIUM) 2 MG tablet Take 1 tablet (2 mg total) by mouth 2 (two) times daily. (Patient taking differently: Take 4 mg by mouth at bedtime.) 60 tablet 5   docusate sodium (COLACE) 100 MG capsule Take 100 mg by mouth daily.     estradiol (ESTRACE) 0.1 MG/GM vaginal cream Place 1 Applicatorful vaginally 2 (two) times a week.     ezetimibe (ZETIA) 10 MG tablet TAKE ONE TABLET BY MOUTH EVERY DAY 90 tablet 0    famotidine (PEPCID) 20 MG tablet Take 40 mg by mouth 2 (two) times daily.     fexofenadine (ALLEGRA) 180 MG tablet Take 180 mg by mouth daily as needed for allergies. (Patient not taking: Reported on 06/08/2022)     lisinopril (ZESTRIL) 5 MG tablet Take 1 tablet (5 mg total) by mouth daily. 90 tablet 1   Magnesium 500 MG CAPS Take 500 mg by mouth daily.     nitroGLYCERIN (NITROSTAT) 0.4 MG SL tablet Place 1 tablet (0.4 mg total) under the tongue every 5 (five) minutes as needed for chest pain. 25 tablet 6   ondansetron (ZOFRAN-ODT) 4 MG disintegrating tablet Take 1 tablet (4 mg total) by mouth every 6 (six) hours as needed for nausea or vomiting. 60 tablet 3   Probiotic Product (PROBIOTIC DAILY PO) Take 1 capsule by mouth daily.     tolterodine (DETROL LA) 4 MG 24 hr capsule Take 4 mg by mouth daily.     ciclopirox (PENLAC) 8 % solution Apply topically at bedtime. Apply over nail and surrounding skin. Apply daily over previous coat. After seven (7) days, may remove with alcohol and continue cycle. (Patient not taking: Reported on 05/04/2022) 6.6 mL 0  gabapentin (NEURONTIN) 300 MG capsule Take 1 capsule (300 mg total) by mouth at bedtime. (Patient not taking: Reported on 05/04/2022) 90 capsule 3   No facility-administered medications prior to visit.    Review of Systems  Constitutional:  Negative for chills, fever, malaise/fatigue and weight loss.  HENT:  Negative for hearing loss, sore throat and tinnitus.   Eyes:  Negative for blurred vision and double vision.  Respiratory:  Positive for shortness of breath. Negative for cough, hemoptysis, sputum production, wheezing and stridor.   Cardiovascular:  Negative for chest pain, palpitations, orthopnea, leg swelling and PND.  Gastrointestinal:  Negative for abdominal pain, constipation, diarrhea, heartburn, nausea and vomiting.  Genitourinary:  Negative for dysuria, hematuria and urgency.  Musculoskeletal:  Negative for joint pain and myalgias.  Skin:   Negative for itching and rash.  Neurological:  Negative for dizziness, tingling, weakness and headaches.  Endo/Heme/Allergies:  Negative for environmental allergies. Does not bruise/bleed easily.  Psychiatric/Behavioral:  Negative for depression. The patient is not nervous/anxious and does not have insomnia.   All other systems reviewed and are negative.    Objective:  Physical Exam Vitals reviewed.  Constitutional:      General: She is not in acute distress.    Appearance: She is well-developed.  HENT:     Head: Normocephalic and atraumatic.  Eyes:     General: No scleral icterus.    Conjunctiva/sclera: Conjunctivae normal.     Pupils: Pupils are equal, round, and reactive to light.  Neck:     Vascular: No JVD.     Trachea: No tracheal deviation.  Cardiovascular:     Rate and Rhythm: Normal rate and regular rhythm.     Heart sounds: Normal heart sounds. No murmur heard. Pulmonary:     Effort: Pulmonary effort is normal. No tachypnea, accessory muscle usage or respiratory distress.     Breath sounds: No stridor. No wheezing, rhonchi or rales.  Abdominal:     General: There is no distension.     Palpations: Abdomen is soft.     Tenderness: There is no abdominal tenderness.  Musculoskeletal:        General: No tenderness.     Cervical back: Neck supple.  Lymphadenopathy:     Cervical: No cervical adenopathy.  Skin:    General: Skin is warm and dry.     Capillary Refill: Capillary refill takes less than 2 seconds.     Findings: No rash.  Neurological:     Mental Status: She is alert and oriented to person, place, and time.  Psychiatric:        Behavior: Behavior normal.      Vitals:   06/08/22 0948  BP: 110/64  Pulse: 94  SpO2: 99%  Weight: 132 lb 9.6 oz (60.1 kg)  Height: '5\' 4"'$  (1.626 m)     99% on RA BMI Readings from Last 3 Encounters:  06/08/22 22.76 kg/m  05/12/22 23.17 kg/m  05/10/22 23.33 kg/m   Wt Readings from Last 3 Encounters:  06/08/22  132 lb 9.6 oz (60.1 kg)  05/12/22 135 lb (61.2 kg)  05/10/22 135 lb 14.4 oz (61.6 kg)     CBC    Component Value Date/Time   WBC 9.5 05/13/2022 0600   RBC 3.80 (L) 05/13/2022 0600   HGB 12.3 05/13/2022 0600   HCT 35.5 (L) 05/13/2022 0600   PLT 186 05/13/2022 0600   MCV 93.4 05/13/2022 0600   MCH 32.4 05/13/2022 0600   MCHC 34.6 05/13/2022  0600   RDW 12.9 05/13/2022 0600   LYMPHSABS 2.5 08/03/2021 0910   MONOABS 0.5 08/03/2021 0910   EOSABS 0.1 08/03/2021 0910   BASOSABS 0.0 08/03/2021 0910    Chest Imaging:  LDCT 2020: IMPRESSION: 1. Lung-RADS 1, negative. Continue annual screening with low-dose chest CT without contrast in 12 months. 2. Moderate hiatal hernia. 3. Aortic atherosclerosis (ICD10-170.0). Coronary artery calcification. 4.  Emphysema (ICD10-J43.9).   Low-dose lung cancer screening CT: 02/08/2022 lung RADS 1 New basilar subpleural reticular densities.   Pulmonary Functions Testing Results:    Latest Ref Rng & Units 03/30/2022   10:24 AM 07/05/2019    2:41 PM  PFT Results  FVC-Pre L 2.35  2.43  P  FVC-Predicted Pre % 80  80  P  FVC-Post L 2.47  2.53  P  FVC-Predicted Post % 84  84  P  Pre FEV1/FVC % % 75  74  P  Post FEV1/FCV % % 78  76  P  FEV1-Pre L 1.76  1.80  P  FEV1-Predicted Pre % 79  78  P  FEV1-Post L 1.93  1.91  P  DLCO uncorrected ml/min/mmHg 17.47  15.39  P  DLCO UNC% % 91  79  P  DLCO corrected ml/min/mmHg 17.47    DLCO COR %Predicted % 91    DLVA Predicted % 99  84  P  TLC L 5.04  4.91  P  TLC % Predicted % 101  98  P  RV % Predicted % 124  115  P    P Preliminary result    FeNO:   Pathology:   Echocardiogram:   Heart Catheterization:     Assessment & Plan:     ICD-10-CM   1. Centrilobular emphysema ([place])  J43.2     2. Hiatal hernia with GERD  K44.9    K21.9     3. Raynaud's phenomenon without gangrene  I73.00     4. Former smoker  Z87.891        Discussion:  This is a 71 year old female, former smoker quit  2009 enrolled in lung cancer screening program.  She has chronic reflux and moderate hiatal hernia had a repair by Dr. Kipp Brood this failed she had a repeat repair this past month.  She is slowly getting better.  She had some reticular densities on the CT concerning for new ILD, HRCT was clear PFTs with no over obstruction.  Plan: Hold off on inhalers at this time.  She tried using Spiriva again she said she did not like the way it made her feel. She had an albuterol inhaler that she never used and it ran out of date. She feels like she is slowly recovering from her surgery she still has some shortness of breath that she thinks is related to the soreness and tightness in her epigastrium.  But this is getting better. For our standpoint we will continue to follow her after having lung cancer screening CTs. She can come back and see SG, NP after her lung cancer screening CT in May 2024.    Current Outpatient Medications:    fluticasone (FLONASE) 50 MCG/ACT nasal spray, Place 2 sprays into both nostrils daily. (Patient taking differently: Place 2 sprays into both nostrils at bedtime.), Disp: 16 g, Rfl: 6   Tiotropium Bromide Monohydrate (SPIRIVA RESPIMAT) 1.25 MCG/ACT AERS, Inhale 2 puffs into the lungs daily., Disp: 4 g, Rfl: 3   acetaminophen (TYLENOL) 325 MG tablet, Take 2 tablets (650 mg total) by  mouth every 6 (six) hours as needed for mild pain or moderate pain., Disp: , Rfl:    albuterol (VENTOLIN HFA) 108 (90 Base) MCG/ACT inhaler, Inhale 2 puffs into the lungs every 6 (six) hours as needed for wheezing or shortness of breath. (Patient not taking: Reported on 06/08/2022), Disp: 8 g, Rfl: 6   atorvastatin (LIPITOR) 80 MG tablet, TAKE ONE TABLET BY MOUTH DAILY, Disp: 90 tablet, Rfl: 3   Calcium-Magnesium-Vitamin D (CALCIUM 1200+D3 PO), Take 1 tablet by mouth daily., Disp: , Rfl:    clopidogrel (PLAVIX) 75 MG tablet, Take 1 tablet (75 mg total) by mouth daily., Disp: 90 tablet, Rfl: 1   diazepam  (VALIUM) 2 MG tablet, Take 1 tablet (2 mg total) by mouth 2 (two) times daily. (Patient taking differently: Take 4 mg by mouth at bedtime.), Disp: 60 tablet, Rfl: 5   docusate sodium (COLACE) 100 MG capsule, Take 100 mg by mouth daily., Disp: , Rfl:    estradiol (ESTRACE) 0.1 MG/GM vaginal cream, Place 1 Applicatorful vaginally 2 (two) times a week., Disp: , Rfl:    ezetimibe (ZETIA) 10 MG tablet, TAKE ONE TABLET BY MOUTH EVERY DAY, Disp: 90 tablet, Rfl: 0   famotidine (PEPCID) 20 MG tablet, Take 40 mg by mouth 2 (two) times daily., Disp: , Rfl:    fexofenadine (ALLEGRA) 180 MG tablet, Take 180 mg by mouth daily as needed for allergies. (Patient not taking: Reported on 06/08/2022), Disp: , Rfl:    lisinopril (ZESTRIL) 5 MG tablet, Take 1 tablet (5 mg total) by mouth daily., Disp: 90 tablet, Rfl: 1   Magnesium 500 MG CAPS, Take 500 mg by mouth daily., Disp: , Rfl:    nitroGLYCERIN (NITROSTAT) 0.4 MG SL tablet, Place 1 tablet (0.4 mg total) under the tongue every 5 (five) minutes as needed for chest pain., Disp: 25 tablet, Rfl: 6   ondansetron (ZOFRAN-ODT) 4 MG disintegrating tablet, Take 1 tablet (4 mg total) by mouth every 6 (six) hours as needed for nausea or vomiting., Disp: 60 tablet, Rfl: 3   Probiotic Product (PROBIOTIC DAILY PO), Take 1 capsule by mouth daily., Disp: , Rfl:    tolterodine (DETROL LA) 4 MG 24 hr capsule, Take 4 mg by mouth daily., Disp: , Rfl:     Garner Nash, DO [facility] 06/08/2022 9:54 AM

## 2022-06-08 NOTE — Patient Instructions (Signed)
Thank you for visiting Dr. Valeta Harms at [facility]. Today we recommend the following:  Repeat lung cancer screening CT chest in May 2023  Hold on inhalers at this time. If shortness of breath continues to be an issue let us know. I suspect it will get better as you continue to recover from surgery.   Return in about 9 months (around 03/09/2023) for with Eric Form, NP.    Please do your part to reduce the spread of COVID-19.

## 2022-06-23 ENCOUNTER — Other Ambulatory Visit: Payer: Self-pay | Admitting: Thoracic Surgery (Cardiothoracic Vascular Surgery)

## 2022-06-23 DIAGNOSIS — K449 Diaphragmatic hernia without obstruction or gangrene: Secondary | ICD-10-CM

## 2022-06-24 ENCOUNTER — Ambulatory Visit (INDEPENDENT_AMBULATORY_CARE_PROVIDER_SITE_OTHER): Payer: Self-pay | Admitting: Thoracic Surgery (Cardiothoracic Vascular Surgery)

## 2022-06-24 ENCOUNTER — Ambulatory Visit
Admission: RE | Admit: 2022-06-24 | Discharge: 2022-06-24 | Disposition: A | Discharge status: Home / Self Care | Payer: PPO | Source: Ambulatory Visit | Attending: Thoracic Surgery (Cardiothoracic Vascular Surgery) | Admitting: Thoracic Surgery (Cardiothoracic Vascular Surgery)

## 2022-06-24 VITALS — BP 124/78 | HR 90 | Resp 20 | Ht 64.0 in | Wt 142.0 lb

## 2022-06-24 DIAGNOSIS — K449 Diaphragmatic hernia without obstruction or gangrene: Secondary | ICD-10-CM | POA: Diagnosis not present

## 2022-06-24 DIAGNOSIS — S42001A Fracture of unspecified part of right clavicle, initial encounter for closed fracture: Secondary | ICD-10-CM | POA: Diagnosis not present

## 2022-06-24 DIAGNOSIS — Q791 Other congenital malformations of diaphragm: Secondary | ICD-10-CM | POA: Diagnosis not present

## 2022-06-24 DIAGNOSIS — M419 Scoliosis, unspecified: Secondary | ICD-10-CM | POA: Diagnosis not present

## 2022-06-24 DIAGNOSIS — Z09 Encounter for follow-up examination after completed treatment for conditions other than malignant neoplasm: Secondary | ICD-10-CM

## 2022-06-24 NOTE — Progress Notes (Signed)
      [address]       [place] [address]             [phone]        Jalee A Aldape [facility] Medical Record #[id number] Date of Birth: 05/04/1951  Referring: Garner Nash, DO Primary Care: Tammi Sou, MD Primary Cardiologist:Jayadeep Irish Lack, MD  Reason for visit:   follow-up  History of Present Illness:     [name] presents for 1 month follow-up appointment.  Overall she is doing well.  She denies any reflux symptoms, or odynophagia.  She occasionally has some dysphagia with pills if she drinks them too fast.  She continues to have some shortness of breath when she bends over.  Physical Exam: BP 124/78   Pulse 90   Resp 20   Ht '5\' 4"'$  (1.626 m)   Wt 142 lb (64.4 kg)   SpO2 98% Comment: RA  BMI 24.37 kg/m   Alert NAD Abdomen, ND No peripheral edema   Diagnostic Studies & Laboratory data: CXR: Clear    Assessment / Plan:   71 year old female status post redo paraesophageal hernia repair.  Overall she is doing well.  She is cleared to resume all activities and food.  I will follow-up with her in 3 months for symptoms check   [name] 06/24/2022 12:59 PM

## 2022-07-05 DIAGNOSIS — Z961 Presence of intraocular lens: Secondary | ICD-10-CM | POA: Diagnosis not present

## 2022-07-05 DIAGNOSIS — H43813 Vitreous degeneration, bilateral: Secondary | ICD-10-CM | POA: Diagnosis not present

## 2022-07-05 DIAGNOSIS — H40013 Open angle with borderline findings, low risk, bilateral: Secondary | ICD-10-CM | POA: Diagnosis not present

## 2022-07-05 DIAGNOSIS — H2512 Age-related nuclear cataract, left eye: Secondary | ICD-10-CM | POA: Diagnosis not present

## 2022-07-05 DIAGNOSIS — H18513 Endothelial corneal dystrophy, bilateral: Secondary | ICD-10-CM | POA: Diagnosis not present

## 2022-07-07 ENCOUNTER — Other Ambulatory Visit: Payer: Self-pay | Admitting: Family Medicine

## 2022-07-07 DIAGNOSIS — Z1231 Encounter for screening mammogram for malignant neoplasm of breast: Secondary | ICD-10-CM

## 2022-07-11 NOTE — Progress Notes (Unsigned)
Cardiology Office Note   Date:  07/12/2022   ID:  [name], DOB [birth year], MRN [id number]  PCP:  Tammi Sou, MD    No chief complaint on file.  PAD  Wt Readings from Last 3 Encounters:  07/12/22 134 lb (60.8 kg)  06/24/22 142 lb (64.4 kg)  06/08/22 132 lb 9.6 oz (60.1 kg)       History of Present Illness: [name] is a 71 y.o. female   with  a family history of significant coronary artery disease. She has a history of early vascular disease and difficult to control hyperlipidemia.  She has had significant carotid disease in the past with stents placed in the right innominate artery 2007) and left common carotid artery (2006). Right CEA in 2008.   She had a cardiac cath in 2013 showing no significant coronary artery disease and only minimal coronary calcification.     Her husband passed away in September 27, 2015. He was at work in [place] when he died from a viral illness.  It was very quick.    Occasional BP spikes at the MDs office in the past.    She had a viral infection in 11/2018.  She had a chest xray.  She was treated with antibiotics.  Since then, she has had more DOE.  Took a while to get back to her usual self.     Stress test in 01/2019: Nuclear stress EF: 56%. The left ventricular ejection fraction is normal (55-65%). No T wave inversion was noted during stress. There was no ST segment deviation noted during stress. The study is normal. This is a low risk study.   Normal perfusion. LVEF 56% with normal wall motion. This is a low risk study.   In 2020, she retired. Her iron was low.  She has EGD and colonoscopy are planned on 12/28, with enteroscopy.  No bleeding identified.  She still takes iron.    She has received her COVID vaccines.    Mild DOE- chronic.  Known mild emphysema.  Follows with pulmonary annually.   Had dental work in 5/21.   Has large hiatal hernia had an operation to treat sx.  Seen by Dr. Kipp Brood.    Seen by CT  surgery for reflux surgery in 2022: " She underwent a PEH replacement in April of 2022.  She states that she is doing very well.  Since surgery she is only had to use her reflux medication once but this was after spaghetti with a lot of spicy flakes on it.  She denies any other episodes of reflux, and has not had any dysphagia."   Repeat GERD surgery in 8/23.  No cardiac issues at the time of the surgery.  Anti SS-A positive.  Mildly positive.  Rheum appt scheduled.   Back to walking 2 miles a day, uses treadmill.  Now working 2 days a week at [employer].   Denies : Chest pain. Dizziness. Leg edema. Nitroglycerin use. Orthopnea. Palpitations. Paroxysmal nocturnal dyspnea. Shortness of breath. Syncope.    Past Medical History:  Diagnosis Date   Abdominal bruit 04/2017   Aortic u/s showed NO ANEURISM.   Anemia    low iron   Anxiety    Carotid artery occlusion    hx L carotid stent   Cataract    COPD (chronic obstructive pulmonary disease) ([place])    DOE, worse since 11/2018.  Stress test neg 01/2019.  Albut x 1 inh prn helpful some.  Pulm 06/2019, PFTs not diag of COPD, DOE likely from deconditioning.   Coronary artery disease    Essential hypertension, benign 05/22/2014   GERD (gastroesophageal reflux disease)    + hx of esophagitis   [name] syndrome    H/O hiatal hernia 02/2019   EGD-->moderate size->surgery recommended but pt declined.   Hematochezia 2020/21   internal hemorrhoids   History of iron deficiency anemia 07/2019   Dr. Bryan Lemma EGD and colonoscopy, some anorectal muscosal ulceration, o/w just mild chronic gastritis.   Also could be poor absorption due to chronic high dose acid suppression. Hb/iron normalized aft iron infus and ongoing oral iron.   History of kidney stones    one time   ILD (interstitial lung disease) ([place])    Detected on lung cancer screening CT 01/2022.  ILD blood panel all normal except weakly + myositis ab's.  Pt to be evaluated by Dr. Chase Caller  asymptomatic as of 03/2022.   Mitral valve prolapse    Mixed hyperlipidemia 05/22/2014   OAB (overactive bladder)    detrol LA from urol helpful   Osteopenia 06/06/2016   05/2016 DEXA T-score -1.5.  06/2018 DEXA T score -1.1.  T score -1.2 06/2021   Peripheral vascular disease, unspecified ([place]) 05/22/2014   innominate and carotid.  U/S f/u 12/2016 showed no signif change compared to 2017--vascular recommended repeat 1 yr.  Doing well as of 08/2018 cardiol f/u-->continued on ASA and Plavix. F/u 08/2019->ASA d/c'd, plavix continued.   Prediabetes 2018   2018 A1c 6.3%.  05/2018 A1c 6.1%. 07/2019 A1c 6.4%.   Raynaud disease    Seasonal allergies    TIA (transient ischemic attack) 2006    Past Surgical History:  Procedure Laterality Date   ABDOMINAL HYSTERECTOMY  1997   CARDIAC CATHETERIZATION  01/2012   NORMAL   CARDIOVASCULAR STRESS TEST  02/05/2019   NORMAL.  EF normal.   CAROTID ENDARTERECTOMY  08/21/2007   right - Dr. Amedeo Plenty   CHOLECYSTECTOMY  2000   COLONOSCOPY  approx 2008; 09/23/19   2008 (Dr. Lenon Ahmadi pt->normal.  08/2019 adenoma, some friable areas of colon + anorectal mucosa inflam w/ ulceration, +diverticulosis. Recall 5 yrs   DEXA  06/06/2016   05/2016 T-score -1.5.  06/2018 T score -1.1.  T score -1.2 06/2021.   ESOPHAGOGASTRODUODENOSCOPY  03/11/2019; 09/23/19   02/2019; 5 cm hiatal hernia; GI suggested surgical repair due to intractable GER.  08/2019 mild chronic gastritis, h pylori neg, multiple benign bx's including duodenum neg for celiac changes.   ESOPHAGOGASTRODUODENOSCOPY N/A 01/18/2021   Procedure: ESOPHAGOGASTRODUODENOSCOPY (EGD);  Surgeon: [name], MD;  Location: [facility] OR;  Service: Thoracic;  Laterality: N/A;   ESOPHAGOGASTRODUODENOSCOPY N/A 05/12/2022   Procedure: ESOPHAGOGASTRODUODENOSCOPY (EGD);  Surgeon: [name], MD;  Location: [facility];  Service: Thoracic;  Laterality: N/A;   Incision and drainage of left thenar space abscess  02/23/2015    s/p cat bite (Dr. Amedeo Plenty)   innominate stent  2007   12/2014 u/s showed patent stent in innominate and L common carotid.  L prox int carotid 40-59% stenosis   LDCT for lung ca screening  06/2019   NEG->rpt 1 yr   Left common carotid stent  2006 and 2007   "                    "                  "                          "                             "  PEXY N/A 05/12/2022   Procedure: GASTROPEXY;  Surgeon: [name], MD;  Location: [facility];  Service: Thoracic;  Laterality: N/A;   TRANSTHORACIC ECHOCARDIOGRAM  2007   NORMAL   TUBAL LIGATION     US CAROTID DOPPLER BILATERAL ([place] HX)  06/2019   left stent w/out stenosis.  R ICA sten 40-50% obst-stable.  07/20/21 R clear, L 40-59%, normal vert and subclav flow.   XI ROBOTIC ASSISTED HIATAL HERNIA REPAIR N/A 05/12/2022   Procedure: XI ROBOTIC ASSISTED HIATAL HERNIA REPAIR;  Surgeon: [name], MD;  Location: [facility];  Service: Thoracic;  Laterality: N/A;   XI ROBOTIC ASSISTED PARAESOPHAGEAL HERNIA REPAIR N/A 01/18/2021   Procedure: XI ROBOTIC ASSISTED PARAESOPHAGEAL HERNIA REPAIR WITH FUNDOPLICATION WITH MESH;  Surgeon: [name], MD;  Location: [facility] OR;  Service: Thoracic;  Laterality: N/A;     Current Outpatient Medications  Medication Sig Dispense Refill   acetaminophen (TYLENOL) 325 MG tablet Take 2 tablets (650 mg total) by mouth every 6 (six) hours as needed for mild pain or moderate pain.     albuterol (VENTOLIN HFA) 108 (90 Base) MCG/ACT inhaler Inhale 2 puffs into the lungs every 6 (six) hours as needed for wheezing or shortness of breath. 8 g 6   atorvastatin (LIPITOR) 80 MG tablet TAKE ONE TABLET BY MOUTH DAILY 90 tablet 3   Calcium-Magnesium-Vitamin D (CALCIUM 1200+D3 PO) Take 1 tablet by mouth daily.     clopidogrel (PLAVIX) 75 MG tablet Take 1 tablet (75 mg total) by mouth daily. 90 tablet 1   diazepam (VALIUM) 2 MG tablet Take 1 tablet (2 mg total) by mouth 2 (two) times daily. 60 tablet 5   docusate  sodium (COLACE) 100 MG capsule Take 100 mg by mouth daily.     estradiol (ESTRACE) 0.1 MG/GM vaginal cream Place 1 Applicatorful vaginally 2 (two) times a week.     ezetimibe (ZETIA) 10 MG tablet TAKE ONE TABLET BY MOUTH EVERY DAY 90 tablet 0   famotidine (PEPCID) 40 MG tablet Take 40 mg by mouth as needed for heartburn.     fexofenadine (ALLEGRA) 180 MG tablet Take 180 mg by mouth daily as needed for allergies.     fluticasone (FLONASE) 50 MCG/ACT nasal spray Place 2 sprays into both nostrils daily. 16 g 6   lisinopril (ZESTRIL) 5 MG tablet Take 1 tablet (5 mg total) by mouth daily. 90 tablet 1   Magnesium 500 MG CAPS Take 500 mg by mouth daily.     nitroGLYCERIN (NITROSTAT) 0.4 MG SL tablet Place 1 tablet (0.4 mg total) under the tongue every 5 (five) minutes as needed for chest pain. 25 tablet 6   ondansetron (ZOFRAN-ODT) 4 MG disintegrating tablet Take 1 tablet (4 mg total) by mouth every 6 (six) hours as needed for nausea or vomiting. 60 tablet 3   Probiotic Product (PROBIOTIC DAILY PO) Take 1 capsule by mouth daily.     Tiotropium Bromide Monohydrate (SPIRIVA RESPIMAT) 1.25 MCG/ACT AERS Inhale 2 puffs into the lungs daily. 4 g 3   tolterodine (DETROL LA) 4 MG 24 hr capsule Take 4 mg by mouth daily.     No current facility-administered medications for this visit.    Allergies:   Doxycycline, Demerol hcl [meperidine], Fentanyl, Fluocinolone, Penicillins, Breo ellipta [fluticasone furoate-vilanterol], and Imdur [isosorbide nitrate]    Social History:  The patient  reports that she quit smoking about 14 years ago. Her smoking use included cigarettes. She has a 47.50 pack-year smoking  history. She has never used smokeless tobacco. She reports that she does not drink alcohol and does not use drugs.   Family History:  The patient's family history includes CAD in her mother and paternal grandfather; Heart attack in her mother; Heart disease in her mother; Heart murmur in her sister;  Hyperlipidemia in her father and mother; Hypertension in her father and mother; Osteoporosis in her paternal grandmother; Pneumonia in her father.    ROS:  Please see the history of present illness.   Otherwise, review of systems are positive for .   All other systems are reviewed and negative.    PHYSICAL EXAM: VS:  BP 112/68   Pulse 87   Ht '5\' 4"'$  (1.626 m)   Wt 134 lb (60.8 kg)   SpO2 96%   BMI 23.00 kg/m  , BMI Body mass index is 23 kg/m. GEN: Well nourished, well developed, in no acute distress HEENT: normal Neck: no JVD, carotid bruits, or masses Cardiac: RRR; no murmurs, rubs, or gallops,no edema  Respiratory:  clear to auscultation bilaterally, normal work of breathing GI: soft, nontender, nondistended, + BS MS: no deformity or atrophy Skin: warm and dry, no rash Neuro:  Strength and sensation are intact Psych: euthymic mood, full affect   EKG:   The ekg ordered 8/23 demonstrates NSR, no ST changes   Recent Labs: 08/03/2021: TSH 0.89 05/10/2022: ALT 25 05/13/2022: BUN 10; Creatinine, Ser 0.72; Hemoglobin 12.3; Platelets 186; Potassium 3.9; Sodium 140   Lipid Panel    Component Value Date/Time   CHOL 143 02/09/2022 0837   TRIG 152.0 (H) 02/09/2022 0837   HDL 53.10 02/09/2022 0837   CHOLHDL 3 02/09/2022 0837   VLDL 30.4 02/09/2022 0837   LDLCALC 60 02/09/2022 0837   LDLDIRECT 134.0 01/28/2021 0923     Other studies Reviewed: Additional studies/ records that were reviewed today with results demonstrating: labs reviewed.   ASSESSMENT AND PLAN:  PAD/coronary calcification: Post subclavian stent.  Moderate RCA lesion by CTA.  Negative CT FFR.  Shortness of breath: Chronic.  Mild emphysema noted.  Doing better since esophagus surgery.  Increasing activity.  Endurance increasing.  Hypertension: The current medical regimen is effective;  continue present plan and medications. Aortic atherosclerosis: Continue aggressive secondary prevention and high potency statin  therapy. Carotid disease: Moderate left-sided carotid disease.  Needs annual ultrasounds.  Patent subclavian stent was patent in 2022 by ultrasound. PreDM: Whole food, plant-based diet.  Avoid processed foods.  Eat high-fiber foods.  Continue regular exercise on the treadmill.   Current medicines are reviewed at length with the patient today.  The patient concerns regarding her medicines were addressed.  The following changes have been made:  No change  Labs/ tests ordered today include:  No orders of the defined types were placed in this encounter.   Recommend 150 minutes/week of aerobic exercise Low fat, low carb, high fiber diet recommended  Disposition:   FU in 1 year   Signed, Larae Grooms, MD  07/12/2022 10:29 AM    [facility] [address], [place], [place]  [address] Phone: [phone]; Fax: [phone]

## 2022-07-12 ENCOUNTER — Encounter: Payer: Self-pay | Admitting: Interventional Cardiology

## 2022-07-12 ENCOUNTER — Ambulatory Visit: Payer: PPO | Attending: Interventional Cardiology | Admitting: Interventional Cardiology

## 2022-07-12 ENCOUNTER — Other Ambulatory Visit: Payer: Self-pay | Admitting: *Deleted

## 2022-07-12 VITALS — BP 112/68 | HR 87 | Ht 64.0 in | Wt 134.0 lb

## 2022-07-12 DIAGNOSIS — I6523 Occlusion and stenosis of bilateral carotid arteries: Secondary | ICD-10-CM

## 2022-07-12 DIAGNOSIS — E782 Mixed hyperlipidemia: Secondary | ICD-10-CM | POA: Diagnosis not present

## 2022-07-12 DIAGNOSIS — I700 Atherosclerosis of aorta: Secondary | ICD-10-CM | POA: Diagnosis not present

## 2022-07-12 DIAGNOSIS — I10 Essential (primary) hypertension: Secondary | ICD-10-CM

## 2022-07-12 DIAGNOSIS — R0609 Other forms of dyspnea: Secondary | ICD-10-CM

## 2022-07-12 DIAGNOSIS — I739 Peripheral vascular disease, unspecified: Secondary | ICD-10-CM

## 2022-07-12 NOTE — Patient Instructions (Signed)
Medication Instructions:  Your physician recommends that you continue on your current medications as directed. Please refer to the Current Medication list given to you today.  *If you need a refill on your cardiac medications before your next appointment, please call your pharmacy*   Lab Work: none If you have labs (blood work) drawn today and your tests are completely normal, you will receive your results only by: MyChart Message (if you have MyChart) OR A paper copy in the mail If you have any lab test that is abnormal or we need to change your treatment, we will call you to review the results.   Testing/Procedures: none   Follow-Up: At [facility], you and your health needs are our priority.  As part of our continuing mission to provide you with exceptional heart care, we have created designated Provider Care Teams.  These Care Teams include your primary Cardiologist (physician) and Advanced Practice Providers (APPs -  Physician Assistants and Nurse Practitioners) who all work together to provide you with the care you need, when you need it.  We recommend signing up for the patient portal called "MyChart".  Sign up information is provided on this After Visit Summary.  MyChart is used to connect with patients for Virtual Visits (Telemedicine).  Patients are able to view lab/test results, encounter notes, upcoming appointments, etc.  Non-urgent messages can be sent to your provider as well.   To learn more about what you can do with MyChart, go to [email or web address].    Your next appointment:   12 month(s)  The format for your next appointment:   In Person  Provider:   Jayadeep Varanasi, MD     Other Instructions   High-Fiber Eating Plan Fiber, also called dietary fiber, is a type of carbohydrate. It is found foods such as fruits, vegetables, whole grains, and beans. A high-fiber diet can have many health benefits. Your health care provider may recommend a  high-fiber diet to help: Prevent constipation. Fiber can make your bowel movements more regular. Lower your cholesterol. Relieve the following conditions: Inflammation of veins in the anus (hemorrhoids). Inflammation of specific areas of the digestive tract (uncomplicated diverticulosis). A problem of the large intestine, also called the colon, that sometimes causes pain and diarrhea (irritable bowel syndrome, or IBS). Prevent overeating as part of a weight-loss plan. Prevent heart disease, type 2 diabetes, and certain cancers. What are tips for following this plan? Reading food labels  Check the nutrition facts label on food products for the amount of dietary fiber. Choose foods that have 5 grams of fiber or more per serving. The goals for recommended daily fiber intake include: Men (age 50 or younger): 34-38 g. Men (over age 50): 28-34 g. Women (age 50 or younger): 25-28 g. Women (over age 50): 22-25 g. Your daily fiber goal is _____________ g. Shopping Choose whole fruits and vegetables instead of processed forms, such as apple juice or applesauce. Choose a wide variety of high-fiber foods such as avocados, lentils, oats, and kidney beans. Read the nutrition facts label of the foods you choose. Be aware of foods with added fiber. These foods often have high sugar and sodium amounts per serving. Cooking Use whole-grain flour for baking and cooking. Cook with brown rice instead of white rice. Meal planning Start the day with a breakfast that is high in fiber, such as a cereal that contains 5 g of fiber or more per serving. Eat breads and cereals that are made with whole-grain flour   instead of refined flour or white flour. Eat brown rice, bulgur wheat, or millet instead of white rice. Use beans in place of meat in soups, salads, and pasta dishes. Be sure that half of the grains you eat each day are whole grains. General information You can get the recommended daily intake of dietary  fiber by: Eating a variety of fruits, vegetables, grains, nuts, and beans. Taking a fiber supplement if you are not able to take in enough fiber in your diet. It is better to get fiber through food than from a supplement. Gradually increase how much fiber you consume. If you increase your intake of dietary fiber too quickly, you may have bloating, cramping, or gas. Drink plenty of water to help you digest fiber. Choose high-fiber snacks, such as berries, raw vegetables, nuts, and popcorn. What foods should I eat? Fruits Berries. Pears. Apples. Oranges. Avocado. Prunes and raisins. Dried figs. Vegetables Sweet potatoes. Spinach. Kale. Artichokes. Cabbage. Broccoli. Cauliflower. Green peas. Carrots. Squash. Grains Whole-grain breads. Multigrain cereal. Oats and oatmeal. Brown rice. Barley. Bulgur wheat. Millet. Quinoa. Bran muffins. Popcorn. Rye wafer crackers. Meats and other proteins Navy beans, kidney beans, and pinto beans. Soybeans. Split peas. Lentils. Nuts and seeds. Dairy Fiber-fortified yogurt. Beverages Fiber-fortified soy milk. Fiber-fortified orange juice. Other foods Fiber bars. The items listed above may not be a complete list of recommended foods and beverages. Contact a dietitian for more information. What foods should I avoid? Fruits Fruit juice. Cooked, strained fruit. Vegetables Fried potatoes. Canned vegetables. Well-cooked vegetables. Grains White bread. Pasta made with refined flour. White rice. Meats and other proteins Fatty cuts of meat. Fried chicken or fried fish. Dairy Milk. Yogurt. Cream cheese. Sour cream. Fats and oils Butters. Beverages Soft drinks. Other foods Cakes and pastries. The items listed above may not be a complete list of foods and beverages to avoid. Talk with your dietitian about what choices are best for you. Summary Fiber is a type of carbohydrate. It is found in foods such as fruits, vegetables, whole grains, and beans. A  high-fiber diet has many benefits. It can help to prevent constipation, lower blood cholesterol, aid weight loss, and reduce your risk of heart disease, diabetes, and certain cancers. Increase your intake of fiber gradually. Increasing fiber too quickly may cause cramping, bloating, and gas. Drink plenty of water while you increase the amount of fiber you consume. The best sources of fiber include whole fruits and vegetables, whole grains, nuts, seeds, and beans. This information is not intended to replace advice given to you by your health care provider. Make sure you discuss any questions you have with your health care provider. Document Revised: 01/16/2020 Document Reviewed: 01/16/2020 Elsevier Patient Education  2023 Elsevier Inc.  Important Information About Sugar       

## 2022-07-13 ENCOUNTER — Other Ambulatory Visit: Payer: Self-pay | Admitting: Family Medicine

## 2022-07-13 NOTE — Telephone Encounter (Signed)
Requesting: Valium Contract: 01/29/20 UDS: 07/31/20 Last Visit:04/06/22 Next Visit: 08/03/22 Last Refill: 01/17/22 (60,5)  Please Advise. Med pending

## 2022-07-25 ENCOUNTER — Encounter: Payer: Self-pay | Admitting: Physician Assistant

## 2022-07-25 ENCOUNTER — Ambulatory Visit (HOSPITAL_COMMUNITY)
Admission: RE | Admit: 2022-07-25 | Discharge: 2022-07-25 | Disposition: A | Discharge status: Home / Self Care | Payer: PPO | Source: Ambulatory Visit | Attending: Surgery | Admitting: Surgery

## 2022-07-25 ENCOUNTER — Ambulatory Visit: Payer: PPO | Admitting: Physician Assistant

## 2022-07-25 VITALS — BP 118/73 | HR 81 | Temp 98.2°F | Resp 20 | Ht 64.0 in | Wt 131.2 lb

## 2022-07-25 DIAGNOSIS — I6523 Occlusion and stenosis of bilateral carotid arteries: Secondary | ICD-10-CM | POA: Insufficient documentation

## 2022-07-25 NOTE — Progress Notes (Signed)
HISTORY AND PHYSICAL     CC:  follow up. Requesting Provider:  Tammi Sou, MD  HPI: This is a 71 y.o. female here for follow up for carotid artery stenosis.  She has remote history of left common carotid artery stent 03/31/05, innominate artery stent 06/23/06, and right carotid endarterectomy 08/21/07 by Dr. Amedeo Plenty. She has a history of TIA's but no stroke.    Pt was last seen 07/20/2021 and at that time she was doing well without neurological sx.    Pt returns today for follow up.    Pt denies any amaurosis fugax, speech difficulties, weakness, numbness, paralysis or clumsiness or facial droop.    She states that she has started getting some burning sensation on her feet and some numbness in her toes.  She does not have claudication or non healing wounds.  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  She states that she had hiatal hernia surgery x 2 with Dr. Kipp Brood.    She has tattoo on her left foot in memory of her husband who passed away with sepsis.   The pt is on a statin for cholesterol management.  The pt is not on a daily aspirin.   Other AC:  Plavix The pt is on ACEI for hypertension.   The pt does not have diabetes Tobacco hx:  former  Pt does not have family hx of AAA.  Past Medical History:  Diagnosis Date   Abdominal bruit 04/2017   Aortic u/s showed NO ANEURISM.   Anemia    low iron   Anxiety    Carotid artery occlusion    hx L carotid stent   Cataract    COPD (chronic obstructive pulmonary disease) ([place])    DOE, worse since 11/2018.  Stress test neg 01/2019.  Albut x 1 inh prn helpful some.  Pulm 06/2019, PFTs not diag of COPD, DOE likely from deconditioning.   Coronary artery disease    Essential hypertension, benign 05/22/2014   GERD (gastroesophageal reflux disease)    + hx of esophagitis   [name] syndrome    H/O hiatal hernia 02/2019   EGD-->moderate size->surgery  recommended but pt declined.   Hematochezia 2020/21   internal hemorrhoids   History of iron deficiency anemia 07/2019   Dr. Bryan Lemma EGD and colonoscopy, some anorectal muscosal ulceration, o/w just mild chronic gastritis.   Also could be poor absorption due to chronic high dose acid suppression. Hb/iron normalized aft iron infus and ongoing oral iron.   History of kidney stones    one time   ILD (interstitial lung disease) ([place])    Detected on lung cancer screening CT 01/2022.  ILD blood panel all normal except weakly + myositis ab's.  Pt to be evaluated by Dr. Chase Caller asymptomatic as of 03/2022.   Mitral valve prolapse    Mixed hyperlipidemia 05/22/2014   OAB (overactive bladder)    detrol LA from urol helpful   Osteopenia 06/06/2016   05/2016 DEXA T-score -1.5.  06/2018 DEXA T score -1.1.  T score -1.2 06/2021   Peripheral vascular disease, unspecified ([place]) 05/22/2014   innominate and carotid.  U/S f/u 12/2016 showed no signif change compared to 2017--vascular recommended repeat 1 yr.  Doing well as of 08/2018 cardiol f/u-->continued on ASA and Plavix. F/u 08/2019->ASA d/c'd, plavix continued.   Prediabetes 2018   2018 A1c 6.3%.  05/2018 A1c 6.1%. 07/2019 A1c 6.4%.  Raynaud disease    Seasonal allergies    TIA (transient ischemic attack) 2006    Past Surgical History:  Procedure Laterality Date   ABDOMINAL HYSTERECTOMY  1997   CARDIAC CATHETERIZATION  01/2012   NORMAL   CARDIOVASCULAR STRESS TEST  02/05/2019   NORMAL.  EF normal.   CAROTID ENDARTERECTOMY  08/21/2007   right - Dr. Amedeo Plenty   CHOLECYSTECTOMY  2000   COLONOSCOPY  approx 2008; 09/23/19   2008 (Dr. Lenon Ahmadi pt->normal.  08/2019 adenoma, some friable areas of colon + anorectal mucosa inflam w/ ulceration, +diverticulosis. Recall 5 yrs   DEXA  06/06/2016   05/2016 T-score -1.5.  06/2018 T score -1.1.  T score -1.2 06/2021.   ESOPHAGOGASTRODUODENOSCOPY  03/11/2019; 09/23/19   02/2019; 5 cm hiatal hernia; GI  suggested surgical repair due to intractable GER.  08/2019 mild chronic gastritis, h pylori neg, multiple benign bx's including duodenum neg for celiac changes.   ESOPHAGOGASTRODUODENOSCOPY N/A 01/18/2021   Procedure: ESOPHAGOGASTRODUODENOSCOPY (EGD);  Surgeon: [name], MD;  Location: [facility] OR;  Service: Thoracic;  Laterality: N/A;   ESOPHAGOGASTRODUODENOSCOPY N/A 05/12/2022   Procedure: ESOPHAGOGASTRODUODENOSCOPY (EGD);  Surgeon: [name], MD;  Location: [place];  Service: Thoracic;  Laterality: N/A;   Incision and drainage of left thenar space abscess  02/23/2015   s/p cat bite (Dr. Amedeo Plenty)   innominate stent  2007   12/2014 u/s showed patent stent in innominate and L common carotid.  L prox int carotid 40-59% stenosis   LDCT for lung ca screening  06/2019   NEG->rpt 1 yr   Left common carotid stent  2006 and 2007   "                    "                  "                          "                             "   PEXY N/A 05/12/2022   Procedure: GASTROPEXY;  Surgeon: [name], MD;  Location: [place];  Service: Thoracic;  Laterality: N/A;   TRANSTHORACIC ECHOCARDIOGRAM  2007   NORMAL   TUBAL LIGATION     US CAROTID DOPPLER BILATERAL ([place] HX)  06/2019   left stent w/out stenosis.  R ICA sten 40-50% obst-stable.  07/20/21 R clear, L 40-59%, normal vert and subclav flow.   XI ROBOTIC ASSISTED HIATAL HERNIA REPAIR N/A 05/12/2022   Procedure: XI ROBOTIC ASSISTED HIATAL HERNIA REPAIR;  Surgeon: [name], MD;  Location: [place];  Service: Thoracic;  Laterality: N/A;   XI ROBOTIC ASSISTED PARAESOPHAGEAL HERNIA REPAIR N/A 01/18/2021   Procedure: XI ROBOTIC ASSISTED PARAESOPHAGEAL HERNIA REPAIR WITH FUNDOPLICATION WITH MESH;  Surgeon: [name], MD;  Location: [facility] OR;  Service: Thoracic;  Laterality: N/A;    Allergies  Allergen Reactions   Doxycycline Shortness Of Breath and Swelling    Lips swelling and short of breath   Demerol Hcl [Meperidine]  Nausea And Vomiting   Fentanyl Nausea And Vomiting    Can tolerate with nausea meds  Pt states all pain medications cause N/V for her    Fluocinolone Nausea And Vomiting   Penicillins Hives   Breo Ellipta [Fluticasone Furoate-Vilanterol] Other (See  Comments)    Lower extremity weakness, lightheadedness    Imdur [Isosorbide Nitrate] Nausea And Vomiting    Current Outpatient Medications  Medication Sig Dispense Refill   acetaminophen (TYLENOL) 325 MG tablet Take 2 tablets (650 mg total) by mouth every 6 (six) hours as needed for mild pain or moderate pain.     albuterol (VENTOLIN HFA) 108 (90 Base) MCG/ACT inhaler Inhale 2 puffs into the lungs every 6 (six) hours as needed for wheezing or shortness of breath. 8 g 6   atorvastatin (LIPITOR) 80 MG tablet TAKE ONE TABLET BY MOUTH DAILY 90 tablet 3   Calcium-Magnesium-Vitamin D (CALCIUM 1200+D3 PO) Take 1 tablet by mouth daily.     clopidogrel (PLAVIX) 75 MG tablet Take 1 tablet (75 mg total) by mouth daily. 90 tablet 1   diazepam (VALIUM) 2 MG tablet TAKE ONE TABLET BY MOUTH TWICE DAILY 60 tablet 5   docusate sodium (COLACE) 100 MG capsule Take 100 mg by mouth daily.     estradiol (ESTRACE) 0.1 MG/GM vaginal cream Place 1 Applicatorful vaginally 2 (two) times a week.     ezetimibe (ZETIA) 10 MG tablet TAKE ONE TABLET BY MOUTH EVERY DAY 90 tablet 0   famotidine (PEPCID) 40 MG tablet Take 40 mg by mouth as needed for heartburn.     fexofenadine (ALLEGRA) 180 MG tablet Take 180 mg by mouth daily as needed for allergies.     fluticasone (FLONASE) 50 MCG/ACT nasal spray Place 2 sprays into both nostrils daily. 16 g 6   lisinopril (ZESTRIL) 5 MG tablet Take 1 tablet (5 mg total) by mouth daily. 90 tablet 1   Magnesium 500 MG CAPS Take 500 mg by mouth daily.     nitroGLYCERIN (NITROSTAT) 0.4 MG SL tablet Place 1 tablet (0.4 mg total) under the tongue every 5 (five) minutes as needed for chest pain. 25 tablet 6   ondansetron (ZOFRAN-ODT) 4 MG  disintegrating tablet Take 1 tablet (4 mg total) by mouth every 6 (six) hours as needed for nausea or vomiting. 60 tablet 3   Probiotic Product (PROBIOTIC DAILY PO) Take 1 capsule by mouth daily.     Tiotropium Bromide Monohydrate (SPIRIVA RESPIMAT) 1.25 MCG/ACT AERS Inhale 2 puffs into the lungs daily. 4 g 3   tolterodine (DETROL LA) 4 MG 24 hr capsule Take 4 mg by mouth daily.     No current facility-administered medications for this visit.    Family History  Problem Relation Age of Onset   Heart attack Mother    CAD Mother    Heart disease Mother        Before age 40   Hypertension Mother    Hyperlipidemia Mother    Hypertension Father    Pneumonia Father    Hyperlipidemia Father    Heart murmur Sister    Osteoporosis Paternal Grandmother    CAD Paternal Grandfather    Breast cancer Neg Hx    Colon cancer Neg Hx    Esophageal cancer Neg Hx    Rectal cancer Neg Hx    Stomach cancer Neg Hx    Colon polyps Neg Hx     Social History   Socioeconomic History   Marital status: Widowed    Spouse name: Not on file   Number of children: 1   Years of education: Not on file   Highest education level: 12th grade  Occupational History   Not on file  Tobacco Use   Smoking status: Former  Packs/day: 1.25    Years: 38.00    Total pack years: 47.50    Types: Cigarettes    Quit date: 09/27/2007    Years since quitting: 14.8   Smokeless tobacco: Never  Vaping Use   Vaping Use: Never used  Substance and Sexual Activity   Alcohol use: No   Drug use: No   Sexual activity: Not on file  Other Topics Concern   Not on file  Social History Narrative   Widowed as of 2014/12/12 (husband died of viral illness while out of town in [place], [place]), has one daughter.   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   Tobacco: 20 pack-yr hx, quit 12-12-07.   Alcohol: rare.   No hx of alc or drug problems.   Exercise: walks 2.49mles on treadmill daily.    Social Determinants of Health   Financial Resource Strain: Low Risk  (02/05/2022)   Overall Financial Resource Strain (CARDIA)    Difficulty of Paying Living Expenses: Not very hard  Food Insecurity: No Food Insecurity (02/05/2022)   Hunger Vital Sign    Worried About Running Out of Food in the Last Year: Never true    Ran Out of Food in the Last Year: Never true  Transportation Needs: No Transportation Needs (05/16/2022)   PRAPARE - THydrologist(Medical): No    Lack of Transportation (Non-Medical): No  Physical Activity: Sufficiently Active (02/05/2022)   Exercise Vital Sign    Days of Exercise per Week: 7 days    Minutes of Exercise per Session: 50 min  Stress: No Stress Concern Present (02/05/2022)   [place]   Feeling of Stress : Not at all  Social Connections: Socially Isolated (02/05/2022)   Social Connection and Isolation Panel [NHANES]    Frequency of Communication with Friends and Family: More than three times a week    Frequency of Social Gatherings with Friends and Family: Twice a week    Attends Religious Services: Never    AMarine scientistor Organizations: No    Attends CArchivistMeetings: Never    Marital Status: Widowed  Intimate Partner Violence: Not At Risk (10/06/2021)   Humiliation, Afraid, Rape, and Kick questionnaire    Fear of Current or Ex-Partner: No    Emotionally Abused: No    Physically Abused: No    Sexually Abused: No     REVIEW OF SYSTEMS:   '[X]'$  denotes positive finding, '[ ]'$  denotes negative finding Cardiac  Comments:  Chest pain or chest pressure:    Shortness of breath upon exertion:    Short of breath when lying flat:    Irregular heart rhythm:        Vascular    Pain in calf, thigh, or hip brought on by ambulation:    Pain in feet at night that wakes you up from your sleep:     Blood clot in your veins:    Leg swelling:          Pulmonary    Oxygen at home:    Productive cough:     Wheezing:         Neurologic    Sudden weakness in arms or legs:     Sudden numbness in arms or legs:     Sudden onset of difficulty speaking or slurred speech:    Temporary loss of  vision in one eye:     Problems with dizziness:         Gastrointestinal    Blood in stool:     Vomited blood:         Genitourinary    Burning when urinating:     Blood in urine:        Psychiatric    Major depression:         Hematologic    Bleeding problems:    Problems with blood clotting too easily:        Skin    Rashes or ulcers:        Constitutional    Fever or chills:      PHYSICAL EXAMINATION:  Today's Vitals   07/25/22 1504  BP: 119/74  Pulse: 81  Resp: 20  Temp: 98.2 F (36.8 C)  TempSrc: Temporal  SpO2: 96%  Weight: 131 lb 3.2 oz (59.5 kg)  Height: '5\' 4"'$  (1.626 m)   Body mass index is 22.52 kg/m.   General:  WDWN in NAD; vital signs documented above Gait: Not observed HENT: WNL, normocephalic Pulmonary: normal non-labored breathing Cardiac: regular HR, with carotid bruit on the right Abdomen: soft, NT; aortic pulse is not palpable Skin: without rashes Vascular Exam/Pulses:  Right Left  Radial 1+ (weak) 1+ (weak)  AT Faint monophasic Faint monophasic  PT Brisk biphasic Brisk biphasic  Peroneal Brisk monophasic Brisk monophasic   Extremities: without open wounds Musculoskeletal: no muscle wasting or atrophy  Neurologic: A&O X 3; moving all extremities equally; speech is fluent/normal Psychiatric:  The pt has Normal affect.   Non-Invasive Vascular Imaging:   Carotid Duplex on 07/25/2022 Right:  Patent carotid endarterectomy site with no evidence for restenosis.  Left:  40-59% ICA stenosis Vertebrals:  Bilateral vertebral arteries demonstrate antegrade flow.  Subclavians: Normal flow hemodynamics were seen in bilateral subclavian arteries.     The Inominate artery stent appears  patent.  Previous Carotid duplex on 07/20/2021: Right: normal Left:   40-59% ICA stenosis    ASSESSMENT/PLAN:: 71 y.o. female here for follow up carotid artery stenosis and has remote history of left common carotid artery stent 03/31/05, innominate artery stent 06/23/06, and right carotid endarterectomy 08/21/07 by Dr. Amedeo Plenty.  -duplex today reveals patent right CEA site and 40-59% left ICA stenosis and patent innominate stent.  She remains asymptomatic -discussed s/s of stroke with pt and she understands should she develop any of these sx, she will go to the nearest ER or call 911. -pt will f/u in one year with carotid duplex -pt will call sooner should she have any issues. -continue statin/Plavix -she has brisk biphasic PT doppler signals bilaterally.  She walks 2 miles daily and works part time as a Educational psychologist.  She does have some numbness on the tips of her toes that is most likely neuropathy but will get ABI at her next visit to get a baseline.   Encouraged her to continue to stay active.  She knows to call sooner if she develops any wounds that are slow to heal. -fortunately she has not smoked in years   Leontine Locket, [facility] [phone]  Clinic MD:  Trula Slade

## 2022-08-02 DIAGNOSIS — Z961 Presence of intraocular lens: Secondary | ICD-10-CM | POA: Diagnosis not present

## 2022-08-02 DIAGNOSIS — H2512 Age-related nuclear cataract, left eye: Secondary | ICD-10-CM | POA: Diagnosis not present

## 2022-08-02 DIAGNOSIS — H43813 Vitreous degeneration, bilateral: Secondary | ICD-10-CM | POA: Diagnosis not present

## 2022-08-02 DIAGNOSIS — H18513 Endothelial corneal dystrophy, bilateral: Secondary | ICD-10-CM | POA: Diagnosis not present

## 2022-08-02 DIAGNOSIS — H40013 Open angle with borderline findings, low risk, bilateral: Secondary | ICD-10-CM | POA: Diagnosis not present

## 2022-08-02 NOTE — Patient Instructions (Signed)

## 2022-08-03 ENCOUNTER — Ambulatory Visit (INDEPENDENT_AMBULATORY_CARE_PROVIDER_SITE_OTHER): Payer: PPO | Admitting: Family Medicine

## 2022-08-03 ENCOUNTER — Encounter: Payer: Self-pay | Admitting: Family Medicine

## 2022-08-03 VITALS — BP 112/68 | HR 82 | Temp 98.0°F | Ht 64.0 in | Wt 135.2 lb

## 2022-08-03 DIAGNOSIS — Z79899 Other long term (current) drug therapy: Secondary | ICD-10-CM | POA: Diagnosis not present

## 2022-08-03 DIAGNOSIS — R7303 Prediabetes: Secondary | ICD-10-CM

## 2022-08-03 DIAGNOSIS — I10 Essential (primary) hypertension: Secondary | ICD-10-CM | POA: Diagnosis not present

## 2022-08-03 DIAGNOSIS — Z0000 Encounter for general adult medical examination without abnormal findings: Secondary | ICD-10-CM | POA: Diagnosis not present

## 2022-08-03 DIAGNOSIS — F411 Generalized anxiety disorder: Secondary | ICD-10-CM

## 2022-08-03 DIAGNOSIS — E7800 Pure hypercholesterolemia, unspecified: Secondary | ICD-10-CM | POA: Diagnosis not present

## 2022-08-03 LAB — LIPID PANEL
Cholesterol: 101 mg/dL (ref 0–200)
HDL: 42.7 mg/dL (ref >39.00)
LDL Cholesterol: 37 mg/dL (ref 0–99)
NonHDL: 58.29
Total CHOL/HDL Ratio: 2
Triglycerides: 107 mg/dL (ref 0.0–149.0)
VLDL: 21.4 mg/dL (ref 0.0–40.0)

## 2022-08-03 LAB — COMPREHENSIVE METABOLIC PANEL
ALT: 28 U/L (ref 0–35)
AST: 28 U/L (ref 0–37)
Albumin: 4.2 g/dL (ref 3.5–5.2)
Alkaline Phosphatase: 98 U/L (ref 39–117)
BUN: 12 mg/dL (ref 6–23)
CO2: 32 mEq/L (ref 19–32)
Calcium: 10.1 mg/dL (ref 8.4–10.5)
Chloride: 106 mEq/L (ref 96–112)
Creatinine, Ser: 0.64 mg/dL (ref 0.40–1.20)
GFR: 89.03 mL/min (ref >60.00)
Glucose, Bld: 86 mg/dL (ref 70–99)
Potassium: 4.7 mEq/L (ref 3.5–5.1)
Sodium: 143 mEq/L (ref 135–145)
Total Bilirubin: 1.4 mg/dL — ABNORMAL HIGH (ref 0.2–1.2)
Total Protein: 6.4 g/dL (ref 6.0–8.3)

## 2022-08-03 LAB — CBC
HCT: 42.1 % (ref 36.0–46.0)
Hemoglobin: 14 g/dL (ref 12.0–15.0)
MCHC: 33.2 g/dL (ref 30.0–36.0)
MCV: 93.7 fl (ref 78.0–100.0)
Platelets: 259 10*3/uL (ref 150.0–400.0)
RBC: 4.49 Mil/uL (ref 3.87–5.11)
RDW: 13.5 % (ref 11.5–15.5)
WBC: 6.8 10*3/uL (ref 4.0–10.5)

## 2022-08-03 LAB — HEMOGLOBIN A1C: Hgb A1c MFr Bld: 6.1 % (ref 4.6–6.5)

## 2022-08-03 MED ORDER — FLUTICASONE PROPIONATE 50 MCG/ACT NA SUSP
2.0000 | Freq: Every day | NASAL | 6 refills | Status: DC
Start: 2022-08-03 — End: 2023-09-11

## 2022-08-03 MED ORDER — EZETIMIBE 10 MG PO TABS: 10.0000 mg | ORAL_TABLET | Freq: Every day | ORAL | 1 refills | Status: DC

## 2022-08-03 MED ORDER — CLOPIDOGREL BISULFATE 75 MG PO TABS: 75.0000 mg | ORAL_TABLET | Freq: Every day | ORAL | 1 refills | Status: DC

## 2022-08-03 MED ORDER — LISINOPRIL 5 MG PO TABS: 5.0000 mg | ORAL_TABLET | Freq: Every day | ORAL | 1 refills | Status: DC

## 2022-08-03 NOTE — Progress Notes (Signed)
Office Note 08/03/2022  CC:  Chief Complaint  Patient presents with   Annual Exam    Pt is fasting    HPI:  Patient is a 71 y.o. female who is here for annual health maintenance exam and follow-up hypertension and GAD (high risk med use).  Doing well status post hiatal hernia repair #2. Diet is not low in carbohydrates.  Anxiety level stable.  Mood is good. PMP AWARE reviewed today: most recent rx for diazepam was filled 07/13/2022, #60, rx by me. No red flags.  Past Medical History:  Diagnosis Date   Abdominal bruit 04/2017   Aortic u/s showed NO ANEURISM.   Anemia    low iron   Anxiety    Carotid artery occlusion    hx L carotid stent   Cataract    COPD (chronic obstructive pulmonary disease) ([place])    DOE, worse since 11/2018.  Stress test neg 01/2019.  Albut x 1 inh prn helpful some.  Pulm 06/2019, PFTs not diag of COPD, DOE likely from deconditioning.   Coronary artery disease    Essential hypertension, benign 05/22/2014   GERD (gastroesophageal reflux disease)    + hx of esophagitis   [name] syndrome    H/O hiatal hernia 02/2019   EGD-->moderate size->surgery recommended but pt declined.   Hematochezia 2020/21   internal hemorrhoids   History of iron deficiency anemia 07/2019   Dr. Bryan Lemma EGD and colonoscopy, some anorectal muscosal ulceration, o/w just mild chronic gastritis.   Also could be poor absorption due to chronic high dose acid suppression. Hb/iron normalized aft iron infus and ongoing oral iron.   History of kidney stones    one time   ILD (interstitial lung disease) ([place])    Detected on lung cancer screening CT 01/2022.  ILD blood panel all normal except weakly + myositis ab's.  Pt to be evaluated by Dr. Chase Caller asymptomatic as of 03/2022.   Mitral valve prolapse    Mixed hyperlipidemia 05/22/2014   OAB (overactive bladder)    detrol LA from urol helpful   Osteopenia 06/06/2016   05/2016 DEXA T-score -1.5.  06/2018 DEXA T score -1.1.  T  score -1.2 06/2021   Peripheral vascular disease, unspecified ([place]) 05/22/2014   innominate and carotid.  U/S f/u 12/2016 showed no signif change compared to 2017--vascular recommended repeat 1 yr.  Doing well as of 08/2018 cardiol f/u-->continued on ASA and Plavix. F/u 08/2019->ASA d/c'd, plavix continued.   Prediabetes 2018   2018 A1c 6.3%.  05/2018 A1c 6.1%. 07/2019 A1c 6.4%.   Raynaud disease    Seasonal allergies    TIA (transient ischemic attack) 2006    Past Surgical History:  Procedure Laterality Date   ABDOMINAL HYSTERECTOMY  1997   CARDIAC CATHETERIZATION  01/2012   NORMAL   CARDIOVASCULAR STRESS TEST  02/05/2019   NORMAL.  EF normal.   CAROTID ENDARTERECTOMY  08/21/2007   right - Dr. Amedeo Plenty   CHOLECYSTECTOMY  2000   COLONOSCOPY  approx 2008; 09/23/19   2008 (Dr. Lenon Ahmadi pt->normal.  08/2019 adenoma, some friable areas of colon + anorectal mucosa inflam w/ ulceration, +diverticulosis. Recall 5 yrs   DEXA  06/06/2016   05/2016 T-score -1.5.  06/2018 T score -1.1.  T score -1.2 06/2021.   ESOPHAGOGASTRODUODENOSCOPY  03/11/2019; 09/23/19   02/2019; 5 cm hiatal hernia; GI suggested surgical repair due to intractable GER.  08/2019 mild chronic gastritis, h pylori neg, multiple benign bx's including duodenum neg for celiac changes.   ESOPHAGOGASTRODUODENOSCOPY  N/A 01/18/2021   Procedure: ESOPHAGOGASTRODUODENOSCOPY (EGD);  Surgeon: [name], MD;  Location: [facility];  Service: Thoracic;  Laterality: N/A;   ESOPHAGOGASTRODUODENOSCOPY N/A 05/12/2022   Procedure: ESOPHAGOGASTRODUODENOSCOPY (EGD);  Surgeon: [name], MD;  Location: [place];  Service: Thoracic;  Laterality: N/A;   Incision and drainage of left thenar space abscess  02/23/2015   s/p cat bite (Dr. Amedeo Plenty)   innominate stent  2007   12/2014 u/s showed patent stent in innominate and L common carotid.  L prox int carotid 40-59% stenosis   LDCT for lung ca screening  06/2019   NEG->rpt 1 yr   Left common  carotid stent  2006 and 2007   "                    "                  "                          "                             "   PEXY N/A 05/12/2022   Procedure: GASTROPEXY;  Surgeon: [name], MD;  Location: [place];  Service: Thoracic;  Laterality: N/A;   TRANSTHORACIC ECHOCARDIOGRAM  2007   NORMAL   TUBAL LIGATION     US CAROTID DOPPLER BILATERAL ([place] HX)  06/2019   left stent w/out stenosis.  R ICA sten 40-50% obst-stable.  07/20/21 R clear, L 40-59%, normal vert and subclav flow.  07/2022 stable   XI ROBOTIC ASSISTED HIATAL HERNIA REPAIR N/A 05/12/2022   Procedure: XI ROBOTIC ASSISTED HIATAL HERNIA REPAIR;  Surgeon: [name], MD;  Location: [place];  Service: Thoracic;  Laterality: N/A;   XI ROBOTIC ASSISTED PARAESOPHAGEAL HERNIA REPAIR N/A 01/18/2021   Procedure: XI ROBOTIC ASSISTED PARAESOPHAGEAL HERNIA REPAIR WITH FUNDOPLICATION WITH MESH;  Surgeon: [name], MD;  Location: [place];  Service: Thoracic;  Laterality: N/A;    Family History  Problem Relation Age of Onset   Heart attack Mother    CAD Mother    Heart disease Mother        Before age 71   Hypertension Mother    Hyperlipidemia Mother    Hypertension Father    Pneumonia Father    Hyperlipidemia Father    Heart murmur Sister    Osteoporosis Paternal Grandmother    CAD Paternal Grandfather    Breast cancer Neg Hx    Colon cancer Neg Hx    Esophageal cancer Neg Hx    Rectal cancer Neg Hx    Stomach cancer Neg Hx    Colon polyps Neg Hx     Social History   Socioeconomic History   Marital status: Widowed    Spouse name: Not on file   Number of children: 1   Years of education: Not on file   Highest education level: 12th grade  Occupational History   Not on file  Tobacco Use   Smoking status: Former    Packs/day: 1.25    Years: 38.00    Total pack years: 47.50    Types: Cigarettes    Quit date: 09/27/2007    Years since quitting: 14.8   Smokeless tobacco: Never  Vaping  Use   Vaping Use: Never used  Substance and Sexual  Activity   Alcohol use: No   Drug use: No   Sexual activity: Not on file  Other Topics Concern   Not on file  Social History Narrative   Widowed as of 12/10/14 (husband died of viral illness while out of town in [place], [place]), has one daughter.   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   Tobacco: 20 pack-yr hx, quit 12-10-07.   Alcohol: rare.   No hx of alc or drug problems.   Exercise: walks 2.58mles on treadmill daily.   Social Determinants of Health   Financial Resource Strain: Low Risk  (02/05/2022)   Overall Financial Resource Strain (CARDIA)    Difficulty of Paying Living Expenses: Not very hard  Food Insecurity: No Food Insecurity (02/05/2022)   Hunger Vital Sign    Worried About Running Out of Food in the Last Year: Never true    Ran Out of Food in the Last Year: Never true  Transportation Needs: No Transportation Needs (05/16/2022)   PRAPARE - THydrologist(Medical): No    Lack of Transportation (Non-Medical): No  Physical Activity: Sufficiently Active (02/05/2022)   Exercise Vital Sign    Days of Exercise per Week: 7 days    Minutes of Exercise per Session: 50 min  Stress: No Stress Concern Present (02/05/2022)   [place]   Feeling of Stress : Not at all  Social Connections: Socially Isolated (02/05/2022)   Social Connection and Isolation Panel [NHANES]    Frequency of Communication with Friends and Family: More than three times a week    Frequency of Social Gatherings with Friends and Family: Twice a week    Attends Religious Services: Never    AMarine scientistor Organizations: No    Attends CArchivistMeetings: Never    Marital Status: Widowed  Intimate Partner Violence: Not At Risk (10/06/2021)   Humiliation, Afraid, Rape, and Kick questionnaire    Fear of Current  or Ex-Partner: No    Emotionally Abused: No    Physically Abused: No    Sexually Abused: No    Outpatient Medications Prior to Visit  Medication Sig Dispense Refill   acetaminophen (TYLENOL) 325 MG tablet Take 2 tablets (650 mg total) by mouth every 6 (six) hours as needed for mild pain or moderate pain.     atorvastatin (LIPITOR) 80 MG tablet TAKE ONE TABLET BY MOUTH DAILY 90 tablet 3   Calcium-Magnesium-Vitamin D (CALCIUM 1200+D3 PO) Take 1 tablet by mouth daily.     diazepam (VALIUM) 2 MG tablet TAKE ONE TABLET BY MOUTH TWICE DAILY 60 tablet 5   docusate sodium (COLACE) 100 MG capsule Take 100 mg by mouth daily.     estradiol (ESTRACE) 0.1 MG/GM vaginal cream Place 1 Applicatorful vaginally 2 (two) times a week.     famotidine (PEPCID) 40 MG tablet Take 40 mg by mouth as needed for heartburn.     fexofenadine (ALLEGRA) 180 MG tablet Take 180 mg by mouth daily as needed for allergies.     Magnesium 500 MG CAPS Take 500 mg by mouth daily.     ondansetron (ZOFRAN-ODT) 4 MG disintegrating tablet Take 1 tablet (4 mg total) by mouth every 6 (six) hours as needed for nausea or vomiting. 60 tablet 3   Probiotic Product (PROBIOTIC DAILY PO) Take 1 capsule by mouth daily.  Tiotropium Bromide Monohydrate (SPIRIVA RESPIMAT) 1.25 MCG/ACT AERS Inhale 2 puffs into the lungs daily. 4 g 3   tolterodine (DETROL LA) 4 MG 24 hr capsule Take 4 mg by mouth daily.     albuterol (VENTOLIN HFA) 108 (90 Base) MCG/ACT inhaler Inhale 2 puffs into the lungs every 6 (six) hours as needed for wheezing or shortness of breath. (Patient not taking: Reported on 08/03/2022) 8 g 6   ketorolac (ACULAR) 0.5 % ophthalmic solution Place 1 drop into the left eye 4 (four) times daily. (Patient not taking: Reported on 08/03/2022)     nitroGLYCERIN (NITROSTAT) 0.4 MG SL tablet Place 1 tablet (0.4 mg total) under the tongue every 5 (five) minutes as needed for chest pain. (Patient not taking: Reported on 08/03/2022) 25 tablet 6    clopidogrel (PLAVIX) 75 MG tablet Take 1 tablet (75 mg total) by mouth daily. 90 tablet 1   ezetimibe (ZETIA) 10 MG tablet TAKE ONE TABLET BY MOUTH EVERY DAY 90 tablet 0   fluticasone (FLONASE) 50 MCG/ACT nasal spray Place 2 sprays into both nostrils daily. 16 g 6   lisinopril (ZESTRIL) 5 MG tablet Take 1 tablet (5 mg total) by mouth daily. 90 tablet 1   No facility-administered medications prior to visit.    Allergies  Allergen Reactions   Doxycycline Shortness Of Breath and Swelling    Lips swelling and short of breath   Demerol Hcl [Meperidine] Nausea And Vomiting   Fentanyl Nausea And Vomiting    Can tolerate with nausea meds  Pt states all pain medications cause N/V for her    Fluocinolone Nausea And Vomiting   Penicillins Hives   Breo Ellipta [Fluticasone Furoate-Vilanterol] Other (See Comments)    Lower extremity weakness, lightheadedness    Imdur [Isosorbide Nitrate] Nausea And Vomiting    ROS Review of Systems  Constitutional:  Negative for appetite change, chills, fatigue and fever.  HENT:  Negative for congestion, dental problem, ear pain and sore throat.   Eyes:  Negative for discharge, redness and visual disturbance.  Respiratory:  Negative for cough, chest tightness, shortness of breath and wheezing.   Cardiovascular:  Negative for chest pain, palpitations and leg swelling.  Gastrointestinal:  Negative for abdominal pain, blood in stool, diarrhea, nausea and vomiting.  Genitourinary:  Negative for difficulty urinating, dysuria, flank pain, frequency, hematuria and urgency.  Musculoskeletal:  Negative for arthralgias, back pain, joint swelling, myalgias and neck stiffness.  Skin:  Negative for pallor and rash.  Neurological:  Negative for dizziness, speech difficulty, weakness and headaches.  Hematological:  Negative for adenopathy. Does not bruise/bleed easily.  Psychiatric/Behavioral:  Negative for confusion and sleep disturbance. The patient is not  nervous/anxious.     PE;    08/03/2022    8:19 AM 07/25/2022    3:05 PM 07/25/2022    3:04 PM  Vitals with BMI  Height '5\' 4"'$   '5\' 4"'$   Weight 135 lbs 3 oz  131 lbs 3 oz  BMI 40.1  02.72  Systolic 536 644 034  Diastolic 68 73 74  Pulse 82  81    Exam chaperoned by Deveron Furlong, CMA. Gen: Alert, well appearing.  Patient is oriented to person, place, time, and situation. AFFECT: pleasant, lucid thought and speech. ENT: Ears: EACs clear, normal epithelium.  TMs with good light reflex and landmarks bilaterally.  Eyes: no injection, icteris, swelling, or exudate.  EOMI, PERRLA. Nose: no drainage or turbinate edema/swelling.  No injection or focal lesion.  Mouth: lips  without lesion/swelling.  Oral mucosa pink and moist.  Dentition intact and without obvious caries or gingival swelling.  Oropharynx without erythema, exudate, or swelling.  Neck: supple/nontender.  No LAD, mass, or TM.  Carotid pulses 2+ bilaterally, without bruits. CV: RRR, no m/r/g.   LUNGS: CTA bilat, nonlabored resps, good aeration in all lung fields. ABD: soft, NT, ND, BS normal.  No hepatospenomegaly or mass.  No bruits. EXT: no clubbing, cyanosis, or edema.  Musculoskeletal: no joint swelling, erythema, warmth, or tenderness.  ROM of all joints intact. Skin - no sores or suspicious lesions or rashes or color changes  Pertinent labs:  Lab Results  Component Value Date   TSH 0.89 08/03/2021   Lab Results  Component Value Date   WBC 9.5 05/13/2022   HGB 12.3 05/13/2022   HCT 35.5 (L) 05/13/2022   MCV 93.4 05/13/2022   PLT 186 05/13/2022   Lab Results  Component Value Date   CREATININE 0.72 05/13/2022   BUN 10 05/13/2022   NA 140 05/13/2022   K 3.9 05/13/2022   CL 109 05/13/2022   CO2 26 05/13/2022   Lab Results  Component Value Date   ALT 25 05/10/2022   AST 28 05/10/2022   ALKPHOS 76 05/10/2022   BILITOT 1.9 (H) 05/10/2022   Lab Results  Component Value Date   CHOL 143 02/09/2022   Lab  Results  Component Value Date   HDL 53.10 02/09/2022   Lab Results  Component Value Date   LDLCALC 60 02/09/2022   Lab Results  Component Value Date   TRIG 152.0 (H) 02/09/2022   Lab Results  Component Value Date   CHOLHDL 3 02/09/2022   Lab Results  Component Value Date   HGBA1C 6.0 02/09/2022   ASSESSMENT AND PLAN:   #1 health maintenance exam: Reviewed age and gender appropriate health maintenance issues (prudent diet, regular exercise, health risks of tobacco and excessive alcohol, use of seatbelts, fire alarms in home, use of sunscreen).  Also reviewed age and gender appropriate health screening as well as vaccine recommendations. Vaccines: Flu->UTD.  Otherwise UTD. Labs: fasting HP and Hba1c (prediabetes). Cervical ca screening:  pt is s/p hysterectomy for benign dx.  pt declines any further screening paps. Breast ca screening: mammogram scheduled for 09/08/22. Colon ca screening: recall 08/2024. Osteoporosis screening: 06/2021 DEXA w/osteopenia, plan rpt 06/2023. LUNG CA screening: due for rpt LDCT-->scheduled for 01/2022. Pt no longer smokes.  #2 hypertension, well controlled on lisinopril 5 mg a day. Electrolytes and creatinine today.  3.  Hypercholesterolemia.  Doing well on atorvastatin 80 mg a day and Zetia 10 mg a day.  #4 GAD with anxiety-related insomnia.  Doing well long-term on Valium 2 mg twice daily. Controlled substance contract updated. Urine drug screen today.  #5 bilateral right toe pain.  She saw podiatrist who prescribed gabapentin but she wanted to wait to talk with me before starting it.  I think this is okay.  She does not know her dose so she will call back with this information and I will give further instructions.  An After Visit Summary was printed and given to the patient.  FOLLOW UP:  Return in about 6 months (around 02/01/2023) for routine chronic illness f/u.  Signed:  Crissie Sickles, MD           08/03/2022

## 2022-08-03 NOTE — Telephone Encounter (Signed)
Start with one tab around bedtime.   If no improvement in 5 days then increase to 1 tab morning and evening. Okay to increase to 1 tab 3 times a day if not improved in another 5 days.

## 2022-08-04 DIAGNOSIS — Z1231 Encounter for screening mammogram for malignant neoplasm of breast: Secondary | ICD-10-CM

## 2022-08-05 LAB — DRUG MONITORING PANEL 376104, URINE
Alphahydroxyalprazolam: NEGATIVE ng/mL (ref <25)
Alphahydroxymidazolam: NEGATIVE ng/mL (ref <50)
Alphahydroxytriazolam: NEGATIVE ng/mL (ref <50)
Aminoclonazepam: NEGATIVE ng/mL (ref <25)
Amphetamines: NEGATIVE ng/mL (ref <500)
Barbiturates: NEGATIVE ng/mL (ref <300)
Benzodiazepines: POSITIVE ng/mL — AB (ref <100)
Cocaine Metabolite: NEGATIVE ng/mL (ref <150)
Desmethyltramadol: NEGATIVE ng/mL (ref <100)
Hydroxyethylflurazepam: NEGATIVE ng/mL (ref <50)
Lorazepam: NEGATIVE ng/mL (ref <50)
Nordiazepam: 532 ng/mL — ABNORMAL HIGH (ref <50)
Opiates: NEGATIVE ng/mL (ref <100)
Oxazepam: 867 ng/mL — ABNORMAL HIGH (ref <50)
Oxycodone: NEGATIVE ng/mL (ref <100)
Temazepam: 817 ng/mL — ABNORMAL HIGH (ref <50)
Tramadol: NEGATIVE ng/mL (ref <100)

## 2022-08-05 LAB — DM TEMPLATE

## 2022-08-06 ENCOUNTER — Other Ambulatory Visit: Payer: Self-pay | Admitting: Family Medicine

## 2022-08-09 DIAGNOSIS — H2512 Age-related nuclear cataract, left eye: Secondary | ICD-10-CM | POA: Diagnosis not present

## 2022-08-26 NOTE — Telephone Encounter (Signed)
Please advise on possible side effects

## 2022-08-29 NOTE — Telephone Encounter (Signed)
In my experience gabapentin is not a common cause of abdominal pain. However, the best way to tell is to stop taking it for at least 3 days to see if the abdominal pain problem goes away.

## 2022-09-08 ENCOUNTER — Ambulatory Visit
Admission: RE | Admit: 2022-09-08 | Discharge: 2022-09-08 | Disposition: A | Discharge status: Home / Self Care | Payer: PPO | Source: Ambulatory Visit

## 2022-09-08 DIAGNOSIS — Z1231 Encounter for screening mammogram for malignant neoplasm of breast: Secondary | ICD-10-CM | POA: Diagnosis not present

## 2022-09-13 ENCOUNTER — Other Ambulatory Visit: Payer: Self-pay | Admitting: Family Medicine

## 2022-09-23 ENCOUNTER — Ambulatory Visit (INDEPENDENT_AMBULATORY_CARE_PROVIDER_SITE_OTHER): Payer: PPO | Admitting: Thoracic Surgery (Cardiothoracic Vascular Surgery)

## 2022-09-23 DIAGNOSIS — K449 Diaphragmatic hernia without obstruction or gangrene: Secondary | ICD-10-CM

## 2022-09-23 NOTE — Progress Notes (Signed)
     [address]       [place] [address]             [phone]       Patient: Home Provider: Office Consent for Telemedicine visit obtained.  Today's visit was completed via a real-time telehealth (see specific modality noted below). The patient/authorized person provided oral consent at the time of the visit to engage in a telemedicine encounter with the present provider at [facility]. The patient/authorized person was informed of the potential benefits, limitations, and risks of telemedicine. The patient/authorized person expressed understanding that the laws that protect confidentiality also apply to telemedicine. The patient/authorized person acknowledged understanding that telemedicine does not provide emergency services and that he or she would need to call 911 or proceed to the nearest hospital for help if such a need arose.   Total time spent in the clinical discussion 10 minutes.  Telehealth Modality: Phone visit (audio only)  I had a telephone visit with [name].  Overall she is doing fine.  She denies any significant reflux or dysphagia.  She is doing well.  She will follow-up as needed.

## 2022-09-28 ENCOUNTER — Encounter: Payer: Self-pay | Admitting: Family Medicine

## 2022-10-03 ENCOUNTER — Encounter: Payer: Self-pay | Admitting: Gastroenterology

## 2022-10-03 NOTE — Telephone Encounter (Signed)
Yes, perfectly reasonable to start MiraLAX 1 cap/day and can titrate up/down as necessary for soft stools without straining to have BM.  Encourage adequate hydration with at least 64 ounces of water/day.  Can then titrate the MiraLAX to have regular stooling and hopefully can titrate back off again

## 2022-10-05 ENCOUNTER — Encounter: Payer: Self-pay | Admitting: Family Medicine

## 2022-10-05 ENCOUNTER — Ambulatory Visit (INDEPENDENT_AMBULATORY_CARE_PROVIDER_SITE_OTHER): Payer: PPO | Admitting: Family Medicine

## 2022-10-05 VITALS — BP 110/71 | HR 82 | Temp 98.1°F | Ht 64.0 in | Wt 134.0 lb

## 2022-10-05 DIAGNOSIS — D229 Melanocytic nevi, unspecified: Secondary | ICD-10-CM | POA: Diagnosis not present

## 2022-10-05 DIAGNOSIS — K5900 Constipation, unspecified: Secondary | ICD-10-CM | POA: Diagnosis not present

## 2022-10-05 DIAGNOSIS — L989 Disorder of the skin and subcutaneous tissue, unspecified: Secondary | ICD-10-CM

## 2022-10-05 NOTE — Progress Notes (Signed)
OFFICE VISIT  10/05/2022  CC: skin spot  Patient is a 72 y.o. female who presents for skin lesion.  HPI: A few months ago she started noticing a bump on her right scapular region that itches. She never did it was there before.  Also notes about 7 days of gurgling in her stomach and diffuse lower abdominal pains on and off, particularly right lower quadrant.  No fever.  Appetite is down.  Anytime she eats it feels like she has to go the bathroom but cannot.  No urinary complaints. She is having infrequent, small and firm bowel movements.  She has taken MiraLAX each of the last 2 days and feels like it has helped only a little bit.   Past Medical History:  Diagnosis Date   Abdominal bruit 04/2017   Aortic u/s showed NO ANEURISM.   Anemia    low iron   Anxiety    Carotid artery occlusion    hx L carotid stent   Cataract    COPD (chronic obstructive pulmonary disease) ([place])    DOE, worse since 11/2018.  Stress test neg 01/2019.  Albut x 1 inh prn helpful some.  Pulm 06/2019, PFTs not diag of COPD, DOE likely from deconditioning.   Coronary artery disease    Essential hypertension, benign 05/22/2014   GERD (gastroesophageal reflux disease)    + hx of esophagitis   [name] syndrome    H/O hiatal hernia 02/2019   EGD-->moderate size->surgery recommended but pt declined.   Hematochezia 2020/21   internal hemorrhoids   History of iron deficiency anemia 07/2019   Dr. Bryan Lemma EGD and colonoscopy, some anorectal muscosal ulceration, o/w just mild chronic gastritis.   Also could be poor absorption due to chronic high dose acid suppression. Hb/iron normalized aft iron infus and ongoing oral iron.   History of kidney stones    one time   ILD (interstitial lung disease) ([place])    Detected on lung cancer screening CT 01/2022.  ILD blood panel all normal except weakly + myositis ab's.  Pt to be evaluated by Dr. Chase Caller asymptomatic as of 03/2022.   Mitral valve prolapse    Mixed  hyperlipidemia 05/22/2014   OAB (overactive bladder)    detrol LA from urol helpful   Osteopenia 06/06/2016   05/2016 DEXA T-score -1.5.  06/2018 DEXA T score -1.1.  T score -1.2 06/2021   Peripheral vascular disease, unspecified ([place]) 05/22/2014   innominate and carotid.  U/S f/u 12/2016 showed no signif change compared to 2017--vascular recommended repeat 1 yr.  Doing well as of 08/2018 cardiol f/u-->continued on ASA and Plavix. F/u 08/2019->ASA d/c'd, plavix continued.   Prediabetes 2018   2018 A1c 6.3%.  05/2018 A1c 6.1%. 07/2019 A1c 6.4%.   Raynaud disease    Seasonal allergies    TIA (transient ischemic attack) 2006    Past Surgical History:  Procedure Laterality Date   ABDOMINAL HYSTERECTOMY  1997   CARDIAC CATHETERIZATION  01/2012   NORMAL   CARDIOVASCULAR STRESS TEST  02/05/2019   NORMAL.  EF normal.   CAROTID ENDARTERECTOMY  08/21/2007   right - Dr. Amedeo Plenty   CHOLECYSTECTOMY  2000   COLONOSCOPY  approx 2008; 09/23/19   2008 (Dr. Lenon Ahmadi pt->normal.  08/2019 adenoma, some friable areas of colon + anorectal mucosa inflam w/ ulceration, +diverticulosis. Recall 5 yrs   DEXA  06/06/2016   05/2016 T-score -1.5.  06/2018 T score -1.1.  T score -1.2 06/2021.   ESOPHAGOGASTRODUODENOSCOPY  03/11/2019; 09/23/19  02/2019; 5 cm hiatal hernia; GI suggested surgical repair due to intractable GER.  08/2019 mild chronic gastritis, h pylori neg, multiple benign bx's including duodenum neg for celiac changes.   ESOPHAGOGASTRODUODENOSCOPY N/A 01/18/2021   Procedure: ESOPHAGOGASTRODUODENOSCOPY (EGD);  Surgeon: [name], MD;  Location: [facility] OR;  Service: Thoracic;  Laterality: N/A;   ESOPHAGOGASTRODUODENOSCOPY N/A 05/12/2022   Procedure: ESOPHAGOGASTRODUODENOSCOPY (EGD);  Surgeon: [name], MD;  Location: [facility];  Service: Thoracic;  Laterality: N/A;   Incision and drainage of left thenar space abscess  02/23/2015   s/p cat bite (Dr. Amedeo Plenty)   innominate stent  2007    12/2014 u/s showed patent stent in innominate and L common carotid.  L prox int carotid 40-59% stenosis   LDCT for lung ca screening  06/2019   NEG->rpt 1 yr   Left common carotid stent  2006 and 2007   "                    "                  "                          "                             "   PEXY N/A 05/12/2022   Procedure: GASTROPEXY;  Surgeon: [name], MD;  Location: [facility];  Service: Thoracic;  Laterality: N/A;   TRANSTHORACIC ECHOCARDIOGRAM  2007   NORMAL   TUBAL LIGATION     US CAROTID DOPPLER BILATERAL ([place] [facility])  06/2019   left stent w/out stenosis.  R ICA sten 40-50% obst-stable.  07/20/21 R clear, L 40-59%, normal vert and subclav flow.  07/2022 stable   XI ROBOTIC ASSISTED HIATAL HERNIA REPAIR N/A 05/12/2022   Procedure: XI ROBOTIC ASSISTED HIATAL HERNIA REPAIR;  Surgeon: [name], MD;  Location: [facility] [facility];  Service: Thoracic;  Laterality: N/A;   XI ROBOTIC ASSISTED PARAESOPHAGEAL HERNIA REPAIR N/A 01/18/2021   Procedure: XI ROBOTIC ASSISTED PARAESOPHAGEAL HERNIA REPAIR WITH FUNDOPLICATION WITH MESH;  Surgeon: [name], MD;  Location: [place];  Service: Thoracic;  Laterality: N/A;    Outpatient Medications Prior to Visit  Medication Sig Dispense Refill   docusate sodium (COLACE) 100 MG capsule Take 100 mg by mouth daily.     estradiol (ESTRACE) 0.1 MG/GM vaginal cream Place 1 Applicatorful vaginally 2 (two) times a week.     ondansetron (ZOFRAN-ODT) 4 MG disintegrating tablet Take 1 tablet (4 mg total) by mouth every 6 (six) hours as needed for nausea or vomiting. 60 tablet 3   Tiotropium Bromide Monohydrate (SPIRIVA RESPIMAT) 1.25 MCG/ACT AERS Inhale 2 puffs into the lungs daily. 4 g 3   acetaminophen (TYLENOL) 325 MG tablet Take 2 tablets (650 mg total) by mouth every 6 (six) hours as needed for mild pain or moderate pain.     albuterol (VENTOLIN HFA) 108 (90 Base) MCG/ACT inhaler Inhale 2 puffs into the lungs every 6 (six) hours as needed  for wheezing or shortness of breath. (Patient not taking: Reported on 08/03/2022) 8 g 6   atorvastatin (LIPITOR) 80 MG tablet TAKE ONE TABLET BY MOUTH DAILY 90 tablet 1   Calcium-Magnesium-Vitamin D (CALCIUM 1200+D3 PO) Take 1 tablet by mouth daily.     clopidogrel (PLAVIX) 75 MG tablet  Take 1 tablet (75 mg total) by mouth daily. 90 tablet 1   diazepam (VALIUM) 2 MG tablet TAKE ONE TABLET BY MOUTH TWICE DAILY 60 tablet 5   ezetimibe (ZETIA) 10 MG tablet Take 1 tablet (10 mg total) by mouth daily. 90 tablet 1   famotidine (PEPCID) 40 MG tablet TAKE ONE TABLET BY MOUTH DAILY 90 tablet 1   fexofenadine (ALLEGRA) 180 MG tablet Take 180 mg by mouth daily as needed for allergies.     fluticasone (FLONASE) 50 MCG/ACT nasal spray Place 2 sprays into both nostrils daily. 16 g 6   lisinopril (ZESTRIL) 5 MG tablet Take 1 tablet (5 mg total) by mouth daily. 90 tablet 1   Magnesium 500 MG CAPS Take 500 mg by mouth daily.     nitroGLYCERIN (NITROSTAT) 0.4 MG SL tablet Place 1 tablet (0.4 mg total) under the tongue every 5 (five) minutes as needed for chest pain. (Patient not taking: Reported on 08/03/2022) 25 tablet 6   Probiotic Product (PROBIOTIC DAILY PO) Take 1 capsule by mouth daily.     tolterodine (DETROL LA) 4 MG 24 hr capsule Take 4 mg by mouth daily.     ketorolac (ACULAR) 0.5 % ophthalmic solution Place 1 drop into the left eye 4 (four) times daily. (Patient not taking: Reported on 08/03/2022)     No facility-administered medications prior to visit.    Allergies  Allergen Reactions   Doxycycline Shortness Of Breath and Swelling    Lips swelling and short of breath   Demerol Hcl [Meperidine] Nausea And Vomiting   Fentanyl Nausea And Vomiting    Can tolerate with nausea meds  Pt states all pain medications cause N/V for her    Fluocinolone Nausea And Vomiting   Penicillins Hives   Breo Ellipta [Fluticasone Furoate-Vilanterol] Other (See Comments)    Lower extremity weakness, lightheadedness     Imdur [Isosorbide Nitrate] Nausea And Vomiting    Review of Systems  As per HPI  PE:    10/05/2022    2:31 PM 08/03/2022    8:19 AM 07/25/2022    3:05 PM  Vitals with BMI  Height '5\' 4"'$  '5\' 4"'$    Weight 134 lbs 135 lbs 3 oz   BMI 67.12 45.8   Systolic 099 833 825  Diastolic 71 68 73  Pulse 82 82      Physical Exam  Gen: Alert, well appearing.  Patient is oriented to person, place, time, and situation. Right scapula with round light brown nevus, borders without any irregularity.  Pigment homogenous. Size 3 mm diameter.  LABS:  Last metabolic panel Lab Results  Component Value Date   GLUCOSE 86 08/03/2022   NA 143 08/03/2022   K 4.7 08/03/2022   CL 106 08/03/2022   CO2 32 08/03/2022   BUN 12 08/03/2022   CREATININE 0.64 08/03/2022   GFRNONAA >60 05/13/2022   CALCIUM 10.1 08/03/2022   PROT 6.4 08/03/2022   ALBUMIN 4.2 08/03/2022   BILITOT 1.4 (H) 08/03/2022   ALKPHOS 98 08/03/2022   AST 28 08/03/2022   ALT 28 08/03/2022   ANIONGAP 5 05/13/2022   IMPRESSION AND PLAN:  #1 nevus of back--right scapula region. Itchy and as far as we know it is new. I recommended we excise it and send it to pathology. Offered dermatology referral or she can return for me to do the procedure and she has elected to return and see me. I will do elliptical excision.  #2 acute constipation. Add Senokot  S1-2 tabs nightly. Continue MiraLAX 1-2 capfuls 1-2 times a day as needed  An After Visit Summary was printed and given to the patient.  FOLLOW UP: Return for pt to make appt at her convenience for mole removal (with me).  Signed:  Crissie Sickles, MD           10/05/2022

## 2022-10-05 NOTE — Patient Instructions (Signed)
Take senakot-S 1-2 tabs every night.  Use miralax 1 capful 1-3 times a day as needed.

## 2022-10-07 NOTE — Progress Notes (Signed)
Office Visit Note  Patient: [name]             Date of Birth: 1950-10-29           MRN: [id number]             PCP: Tammi Sou, MD Referring: Brand Males, MD Visit Date: 10/21/2022 Occupation: '@GUAROCC'$ @  Subjective:  Raynauds phenomenon okay  History of Present Illness: [name] is a 72 y.o. female in consultation per request of Dr. Lynford Citizen.  According the patient she has been getting annual CT scan for cancer screening due to previous history of smoking.  She was evaluated by Dr. Lynford Citizen on April 12, 2022 for the CT scan changes.  There was no evidence of interstitial lung disease.  She had moderate hiatal hernia which was causing pulmonary changes.  She had hiatal hernia repair x 2.  Dr. Lynford Citizen obtained labs which showed positive anti-SSA 52 Kd immunoglobulin antibody.  He referred her here for further evaluation.  Patient gives history of dry mouth and dry eyes for many years.  She also had cataract surgery.  She denies any history of oral ulcers, nasal ulcers, malar rash, photosensitivity or lymphadenopathy.  There is no history of joint inflammation or joint pain except that she has intermittent discomfort in the lower lumbar region.  She states her PCP ordered x-rays in the past which showed degenerative changes.  She denies any radiculopathy.  There is no history of muscle weakness.  She has noticed Raynauds symptoms in the last 1 year.  She states her hands get very cold in the morning especially if she is holding cold drinks.  She is also noticed discoloration in her feet.  She has never developed any digital ulcers.  She wears warm clothing to keep her hands and feet warm.  She works out on a daily basis.  She exercises 50 minutes daily on the treadmill and also does yard work.  There is no family history of autoimmune disease.    Activities of Daily Living:  Patient reports morning stiffness for 0  none .   Patient Reports nocturnal pain.   Difficulty dressing/grooming: Denies Difficulty climbing stairs: Denies Difficulty getting out of chair: Denies Difficulty using hands for taps, buttons, cutlery, and/or writing: Denies  Review of Systems  Constitutional:  Negative for fatigue.  HENT:  Positive for mouth dryness. Negative for mouth sores.   Eyes:  Positive for dryness.  Respiratory:  Positive for shortness of breath.   Cardiovascular:  Negative for chest pain and palpitations.  Gastrointestinal:  Positive for constipation. Negative for blood in stool and diarrhea.  Endocrine: Negative for increased urination.  Genitourinary:  Negative for involuntary urination.  Musculoskeletal:  Negative for joint pain, gait problem, joint pain, joint swelling, myalgias, muscle weakness, morning stiffness, muscle tenderness and myalgias.  Skin:  Negative for color change, rash, hair loss and sensitivity to sunlight.  Allergic/Immunologic: Negative for susceptible to infections.  Neurological:  Negative for dizziness and headaches.  Hematological:  Negative for swollen glands.  Psychiatric/Behavioral:  Negative for depressed mood and sleep disturbance. The patient is not nervous/anxious.     PMFS History:  Patient Active Problem List   Diagnosis Date Noted   Flatulence 10/18/2022   Belching 10/18/2022   Other constipation 10/18/2022   S/P robot-assisted surgical procedure 05/12/2022   Other emphysema ([place]) 05/10/2021   Former smoker 05/10/2021   Hiatal hernia 01/18/2021   Overweight (BMI 25.0-29.9) 08/01/2019   DOE (  dyspnea on exertion) 05/25/2019   Oral lesion 02/20/2019   Epistaxis, recurrent 02/20/2019   Mixed hyperlipidemia 05/22/2014   Essential hypertension, benign 05/22/2014   Peripheral vascular disease ([place]) 05/22/2014   Aftercare following surgery of the circulatory system, NEC 09/17/2013   Carotid artery disease without cerebral infarction ([place]) 08/06/2012    Past Medical History:  Diagnosis Date   Abdominal  bruit 04/2017   Aortic u/s showed NO ANEURISM.   Anemia    low iron   Anxiety    Carotid artery occlusion    hx L carotid stent   Cataract    COPD (chronic obstructive pulmonary disease) ([place])    DOE, worse since 11/2018.  Stress test neg 01/2019.  Albut x 1 inh prn helpful some.  Pulm 06/2019, PFTs not diag of COPD, DOE likely from deconditioning.   Coronary artery disease    Essential hypertension, benign 05/22/2014   GERD (gastroesophageal reflux disease)    + hx of esophagitis   [name] syndrome    H/O hiatal hernia 02/2019   EGD-->moderate size->surgery recommended but pt declined.   Hematochezia 2020/21   internal hemorrhoids   History of iron deficiency anemia 07/2019   Dr. Bryan Lemma EGD and colonoscopy, some anorectal muscosal ulceration, o/w just mild chronic gastritis.   Also could be poor absorption due to chronic high dose acid suppression. Hb/iron normalized aft iron infus and ongoing oral iron.   History of kidney stones    one time   ILD (interstitial lung disease) ([place])    Detected on lung cancer screening CT 01/2022.  ILD blood panel all normal except weakly + myositis ab's.  Pt to be evaluated by Dr. Chase Caller asymptomatic as of 03/2022.   Mitral valve prolapse    Mixed hyperlipidemia 05/22/2014   OAB (overactive bladder)    detrol LA from urol helpful   Osteopenia 06/06/2016   05/2016 DEXA T-score -1.5.  06/2018 DEXA T score -1.1.  T score -1.2 06/2021   Peripheral vascular disease, unspecified ([place]) 05/22/2014   innominate and carotid.  U/S f/u 12/2016 showed no signif change compared to 2017--vascular recommended repeat 1 yr.  Doing well as of 08/2018 cardiol f/u-->continued on ASA and Plavix. F/u 08/2019->ASA d/c'd, plavix continued.   Prediabetes 2018   2018 A1c 6.3%.  05/2018 A1c 6.1%. 07/2019 A1c 6.4%.   Raynaud disease    Seasonal allergies    TIA (transient ischemic attack) 2006    Family History  Problem Relation Age of Onset   Heart attack Mother     CAD Mother    Heart disease Mother        Before age 62   Hypertension Mother    Hyperlipidemia Mother    Hypertension Father    Pneumonia Father    Hyperlipidemia Father    Heart murmur Sister    Osteoporosis Paternal Grandmother    CAD Paternal Grandfather    Breast cancer Neg Hx    Colon cancer Neg Hx    Esophageal cancer Neg Hx    Rectal cancer Neg Hx    Stomach cancer Neg Hx    Colon polyps Neg Hx    Past Surgical History:  Procedure Laterality Date   ABDOMINAL HYSTERECTOMY  1997   CARDIAC CATHETERIZATION  01/2012   NORMAL   CARDIOVASCULAR STRESS TEST  02/05/2019   NORMAL.  EF normal.   CAROTID ENDARTERECTOMY  08/21/2007   right - Dr. Amedeo Plenty   CHOLECYSTECTOMY  2000   COLONOSCOPY  approx 2008; 09/23/19  01/05/2007 (Dr. Lenon Ahmadi pt->normal.  08/2019 adenoma, some friable areas of colon + anorectal mucosa inflam w/ ulceration, +diverticulosis. Recall 5 yrs   DEXA  06/06/2016   05/2016 T-score -1.5.  06/2018 T score -1.1.  T score -1.2 06/2021.   ESOPHAGOGASTRODUODENOSCOPY  03/11/2019; 09/23/19   02/2019; 5 cm hiatal hernia; GI suggested surgical repair due to intractable GER.  08/2019 mild chronic gastritis, h pylori neg, multiple benign bx's including duodenum neg for celiac changes.   ESOPHAGOGASTRODUODENOSCOPY N/A 01/18/2021   Procedure: ESOPHAGOGASTRODUODENOSCOPY (EGD);  Surgeon: [name], MD;  Location: [facility] OR;  Service: Thoracic;  Laterality: N/A;   ESOPHAGOGASTRODUODENOSCOPY N/A 05/12/2022   Procedure: ESOPHAGOGASTRODUODENOSCOPY (EGD);  Surgeon: [name], MD;  Location: [place];  Service: Thoracic;  Laterality: N/A;   Incision and drainage of left thenar space abscess  02/23/2015   s/p cat bite (Dr. Amedeo Plenty)   innominate stent  2007   12/2014 u/s showed patent stent in innominate and L common carotid.  L prox int carotid 40-59% stenosis   LDCT for lung ca screening  06/2019   NEG->rpt 1 yr   Left common carotid stent  01/04/2005 and 01/04/2006   "                     "                  "                          "                             "   PEXY N/A 05/12/2022   Procedure: GASTROPEXY;  Surgeon: [name], MD;  Location: [place];  Service: Thoracic;  Laterality: N/A;   TRANSTHORACIC ECHOCARDIOGRAM  2006/01/04   NORMAL   TUBAL LIGATION     US CAROTID DOPPLER BILATERAL ([place] [facility])  06/2019   left stent w/out stenosis.  R ICA sten 40-50% obst-stable.  07/20/21 R clear, L 40-59%, normal vert and subclav flow.  07/2022 stable   XI ROBOTIC ASSISTED HIATAL HERNIA REPAIR N/A 05/12/2022   Procedure: XI ROBOTIC ASSISTED HIATAL HERNIA REPAIR;  Surgeon: [name], MD;  Location: [facility] OR;  Service: Thoracic;  Laterality: N/A;   XI ROBOTIC ASSISTED PARAESOPHAGEAL HERNIA REPAIR N/A 01/18/2021   Procedure: XI ROBOTIC ASSISTED PARAESOPHAGEAL HERNIA REPAIR WITH FUNDOPLICATION WITH MESH;  Surgeon: [name], MD;  Location: [facility] OR;  Service: Thoracic;  Laterality: N/A;   Social History   Social History Narrative   Widowed as of 05-Jan-2015 (husband died of viral illness while out of town in [place], [place]), has one daughter.   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   Tobacco: 20 pack-yr hx, quit 01/05/08.   Alcohol: rare.   No hx of alc or drug problems.   Exercise: walks 2.40mles on treadmill daily.   Immunization History  Administered Date(s) Administered   Fluad Quad(high Dose 65+) 06/11/2019, 06/27/2022   Influenza, High Dose Seasonal PF 05/23/2018, 07/21/2020   Influenza-Unspecified 07/01/2021   Moderna Covid-19 Vaccine Bivalent Booster 187yr& up 07/22/2021   Moderna Sars-Covid-2 Vaccination 10/23/2019, 11/20/2019, 07/21/2020   Pfizer Covid-19 Vaccine Bivalent Booster 1267yr up 06/27/2022   Pneumococcal Conjugate-13 05/23/2016   Pneumococcal Polysaccharide-23 05/24/2017   Respiratory Syncytial Virus Vaccine,Recomb Aduvanted(Arexvy)  07/15/2022   Tdap 02/22/2015   Zoster Recombinat (Shingrix)  07/11/2018, 10/18/2018     Objective: Vital Signs: BP 126/80 (BP Location: Right Arm, Patient Position: Sitting, Cuff Size: Normal)   Pulse 83   Resp 15   Ht '5\' 4"'$  (1.626 m)   Wt 132 lb (59.9 kg)   BMI 22.66 kg/m    Physical Exam Vitals and nursing note reviewed.  Constitutional:      Appearance: She is well-developed.  HENT:     Head: Normocephalic and atraumatic.  Eyes:     Conjunctiva/sclera: Conjunctivae normal.  Cardiovascular:     Rate and Rhythm: Normal rate and regular rhythm.     Heart sounds: Normal heart sounds.  Pulmonary:     Effort: Pulmonary effort is normal.     Breath sounds: Normal breath sounds.  Abdominal:     General: Bowel sounds are normal.     Palpations: Abdomen is soft.  Musculoskeletal:     Cervical back: Normal range of motion.  Lymphadenopathy:     Cervical: No cervical adenopathy.  Skin:    General: Skin is warm and dry.     Capillary Refill: Capillary refill takes 2 to 3 seconds.     Comments: No nailbed capillary changes or sclerodactyly was noted.  No Telangiectasia were noted.  Neurological:     Mental Status: She is alert and oriented to person, place, and time.  Psychiatric:        Behavior: Behavior normal.      Musculoskeletal Exam: Cervical spine was in good range of motion without discomfort.  She had thoracic kyphosis without any point tenderness.  She had good mobility in her lumbar spine.  She had mild tenderness over left SI joint.  Shoulder joints, elbow joints, wrist joints, MCPs PIPs and DIPs been good range of motion without synovitis.  Hip joints, knee joints, ankles, MTPs and PIPs were in good range of motion without synovitis.  CDAI Exam: CDAI Score: -- Patient Global: --; Provider Global: -- Swollen: --; Tender: -- Joint Exam 10/21/2022   No joint exam has been documented for this visit   There is currently no information documented on the homunculus. Go to the Rheumatology activity and complete the homunculus  joint exam.  Investigation: No additional findings.  Imaging: No results found.  Recent Labs: Lab Results  Component Value Date   WBC 6.8 08/03/2022   HGB 14.0 08/03/2022   PLT 259.0 08/03/2022   NA 143 08/03/2022   K 4.7 08/03/2022   CL 106 08/03/2022   CO2 32 08/03/2022   GLUCOSE 86 08/03/2022   BUN 12 08/03/2022   CREATININE 0.64 08/03/2022   BILITOT 1.4 (H) 08/03/2022   ALKPHOS 98 08/03/2022   AST 28 08/03/2022   ALT 28 08/03/2022   PROT 6.4 08/03/2022   ALBUMIN 4.2 08/03/2022   CALCIUM 10.1 08/03/2022   GFRAA >60 05/15/2020     Speciality Comments: No specialty comments available.  Procedures:  No procedures performed Allergies: Doxycycline, Demerol hcl [meperidine], Fentanyl, Fluocinolone, Penicillins, Breo ellipta [fluticasone furoate-vilanterol], and Imdur [isosorbide nitrate]   Assessment / Plan:     Visit Diagnoses: Raynaud's phenomenon without gangrene - 02/25/22: ANCA-, Scl-70-, ESR 6, aldolase 4.8, anti-CCP<16, dDNA-, Anti-Jo1-, Sm-, Ro-, La-, CK 71. Myomarker panel: anti-SS-A 52kD ab 27, rest of panel negative -patient gives history of Raynaud's phenomenon over the last few years.  She states the symptoms have been worse in the last 1 year.  She denies any history of  digital ulcers.  She had delayed capillary refill without any nailbed capillary changes or sclerodactyly on my examination today.  She has not been taking any medications for Raynaud's phenomenon.  I discussed the option of amlodipine but she declined.  Keeping core temperature warm and warm clothing was discussed.  She also has positive SSA antibody 28 Kd which is usually associated with myositis.  Patient CK and aldolase were normal.  She has no history of rash and no muscular weakness.  She works out every day on the treadmill for 15 minutes.  She also does yard work and works part-time as a Programme researcher, broadcasting/film/video.  She has no difficulty climbing stairs or getting up from the squatting position.  No muscular  weakness or tenderness was noted on the examination today.  To complete the workup for Raynaud's phenomenon I will obtain following labs.  Most likely her Raynaud's related to her history of smoking.  Keeping core temperature warm and warm clothing was discussed.  Plan: Cryoglobulin, ANA, RNP Antibody, Beta-2 glycoprotein antibodies, Cardiolipin antibodies, IgG, IgM, IgA, C3 and C4  Thoracic pain-she complains of some thoracic discomfort which she describes over the left infrascapular region.  I reviewed her chest x-ray results from June 24, 2022 she has some thoracic scoliosis.  Which most likely causes discomfort.  Stretching exercises and yoga were discussed.  DDD (degenerative disc disease), lumbar-she complains of lower back discomfort and some discomfort in the left SI joint.  She has some tenderness over the left SI joint.  She declined x-ray.  I reviewed x-rays of the lumbar spine done on Feb 10, 2022 by Dr. Anitra Lauth which showed multilevel spondylosis with facet joint arthropathy.  She had L4-L5 endplate loss and grade 1 anterolisthesis L5-S1 which is most likely contributing to her discomfort in the lumbar region.  Core strengthening exercises were discussed and a handout was placed in the AVS.  Her daughter is a Art gallery manager.  She plans to join her yoga classes.  Osteopenia-DEXA scan in October 2022 showed a T-score of -1.2.  Patient states she has been getting regular DEXA scan by her PCP.  Calcium rich diet and vitamin D was emphasized.  She has been exercising on a regular basis.  Carotid artery disease without cerebral infarction (HCC)-history of TIA 2006  Essential hypertension, benign-blood pressure was normal at 126/80 today.  Mixed hyperlipidemia  Peripheral vascular disease (HCC)-patient denies having peripheral vascular studies.  Other emphysema (HCC)-she was diagnosed with COPD in the past.  She uses inhalers as needed.  Hiatal hernia  S/P robot-assisted surgical  procedure-hiatal hernia repair x2  Former smoker  Orders: Orders Placed This Encounter  Procedures   Cryoglobulin   ANA   RNP Antibody   Beta-2 glycoprotein antibodies   Cardiolipin antibodies, IgG, IgM, IgA   C3 and C4   No orders of the defined types were placed in this encounter.    Follow-Up Instructions: Return for raynauds phenomenon.   Bo Merino, MD  Note - This record has been created using Editor, commissioning.  Chart creation errors have been sought, but may not always  have been located. Such creation errors do not reflect on  the standard of medical care.

## 2022-10-11 NOTE — Progress Notes (Signed)
Subjective:   [name] is a 72 y.o. female who presents for Medicare Annual (Subsequent) preventive examination.  Review of Systems    Defer to PCP Cardiac Risk Factors include: advanced age (>20mn, >>56women);hypertension;dyslipidemia     Objective:    Today's Vitals   10/12/22 1102  BP: (!) 87/56  Pulse: 90  Temp: 97.6 F (36.4 C)  SpO2: 99%  Weight: 134 lb (60.8 kg)  Height: '5\' 4"'$  (1.626 m)   Body mass index is 23 kg/m.     10/12/2022   10:30 AM 05/12/2022    6:15 PM 05/10/2022    1:18 PM 01/24/2022   11:11 AM 11/01/2021   11:17 PM 10/06/2021    2:38 PM 01/18/2021    6:10 AM  Advanced Directives  Does Patient Have a Medical Advance Directive? No No No No No Yes No  Type of Advance Directive      HCrooked [place] Chart?      No - copy requested   Would patient like information on creating a medical advance directive? No - Patient declined No - Patient declined No - Patient declined  No - Patient declined  No - Patient declined    Current Medications (verified) Outpatient Encounter Medications as of 10/12/2022  Medication Sig   acetaminophen (TYLENOL) 325 MG tablet Take 2 tablets (650 mg total) by mouth every 6 (six) hours as needed for mild pain or moderate pain.   albuterol (VENTOLIN HFA) 108 (90 Base) MCG/ACT inhaler Inhale 2 puffs into the lungs every 6 (six) hours as needed for wheezing or shortness of breath. (Patient not taking: Reported on 08/03/2022)   atorvastatin (LIPITOR) 80 MG tablet TAKE ONE TABLET BY MOUTH DAILY   Calcium-Magnesium-Vitamin D (CALCIUM 1200+D3 PO) Take 1 tablet by mouth daily.   clopidogrel (PLAVIX) 75 MG tablet Take 1 tablet (75 mg total) by mouth daily.   diazepam (VALIUM) 2 MG tablet TAKE ONE TABLET BY MOUTH TWICE DAILY   docusate sodium (COLACE) 100 MG capsule Take 100 mg by mouth daily.   estradiol (ESTRACE) 0.1 MG/GM vaginal cream Place 1 Applicatorful vaginally 2 (two) times a  week.   ezetimibe (ZETIA) 10 MG tablet Take 1 tablet (10 mg total) by mouth daily.   famotidine (PEPCID) 40 MG tablet TAKE ONE TABLET BY MOUTH DAILY   fexofenadine (ALLEGRA) 180 MG tablet Take 180 mg by mouth daily as needed for allergies.   fluticasone (FLONASE) 50 MCG/ACT nasal spray Place 2 sprays into both nostrils daily.   lisinopril (ZESTRIL) 5 MG tablet Take 1 tablet (5 mg total) by mouth daily.   Magnesium 500 MG CAPS Take 500 mg by mouth daily.   nitroGLYCERIN (NITROSTAT) 0.4 MG SL tablet Place 1 tablet (0.4 mg total) under the tongue every 5 (five) minutes as needed for chest pain. (Patient not taking: Reported on 08/03/2022)   ondansetron (ZOFRAN-ODT) 4 MG disintegrating tablet Take 1 tablet (4 mg total) by mouth every 6 (six) hours as needed for nausea or vomiting.   Probiotic Product (PROBIOTIC DAILY PO) Take 1 capsule by mouth daily.   Tiotropium Bromide Monohydrate (SPIRIVA RESPIMAT) 1.25 MCG/ACT AERS Inhale 2 puffs into the lungs daily.   tolterodine (DETROL LA) 4 MG 24 hr capsule Take 4 mg by mouth daily.   No facility-administered encounter medications on file as of 10/12/2022.    Allergies (verified) Doxycycline, Demerol hcl [meperidine], Fentanyl, Fluocinolone, Penicillins, Breo ellipta [fluticasone  furoate-vilanterol], and Imdur [isosorbide nitrate]   History: Past Medical History:  Diagnosis Date   Abdominal bruit 04/2017   Aortic u/s showed NO ANEURISM.   Anemia    low iron   Anxiety    Carotid artery occlusion    hx L carotid stent   Cataract    COPD (chronic obstructive pulmonary disease) ([place])    DOE, worse since 11/2018.  Stress test neg 01/2019.  Albut x 1 inh prn helpful some.  Pulm 06/2019, PFTs not diag of COPD, DOE likely from deconditioning.   Coronary artery disease    Essential hypertension, benign 05/22/2014   GERD (gastroesophageal reflux disease)    + hx of esophagitis   [name] syndrome    H/O hiatal hernia 02/2019   EGD-->moderate  size->surgery recommended but pt declined.   Hematochezia 2020/21   internal hemorrhoids   History of iron deficiency anemia 07/2019   Dr. Bryan Lemma EGD and colonoscopy, some anorectal muscosal ulceration, o/w just mild chronic gastritis.   Also could be poor absorption due to chronic high dose acid suppression. Hb/iron normalized aft iron infus and ongoing oral iron.   History of kidney stones    one time   ILD (interstitial lung disease) ([place])    Detected on lung cancer screening CT 01/2022.  ILD blood panel all normal except weakly + myositis ab's.  Pt to be evaluated by Dr. Chase Caller asymptomatic as of 03/2022.   Mitral valve prolapse    Mixed hyperlipidemia 05/22/2014   OAB (overactive bladder)    detrol LA from urol helpful   Osteopenia 06/06/2016   05/2016 DEXA T-score -1.5.  06/2018 DEXA T score -1.1.  T score -1.2 06/2021   Peripheral vascular disease, unspecified ([place]) 05/22/2014   innominate and carotid.  U/S f/u 12/2016 showed no signif change compared to 2017--vascular recommended repeat 1 yr.  Doing well as of 08/2018 cardiol f/u-->continued on ASA and Plavix. F/u 08/2019->ASA d/c'd, plavix continued.   Prediabetes 2018   2018 A1c 6.3%.  05/2018 A1c 6.1%. 07/2019 A1c 6.4%.   Raynaud disease    Seasonal allergies    TIA (transient ischemic attack) 2006   Past Surgical History:  Procedure Laterality Date   ABDOMINAL HYSTERECTOMY  1997   CARDIAC CATHETERIZATION  01/2012   NORMAL   CARDIOVASCULAR STRESS TEST  02/05/2019   NORMAL.  EF normal.   CAROTID ENDARTERECTOMY  08/21/2007   right - Dr. Amedeo Plenty   CHOLECYSTECTOMY  2000   COLONOSCOPY  approx 2008; 09/23/19   2008 (Dr. Lenon Ahmadi pt->normal.  08/2019 adenoma, some friable areas of colon + anorectal mucosa inflam w/ ulceration, +diverticulosis. Recall 5 yrs   DEXA  06/06/2016   05/2016 T-score -1.5.  06/2018 T score -1.1.  T score -1.2 06/2021.   ESOPHAGOGASTRODUODENOSCOPY  03/11/2019; 09/23/19   02/2019; 5 cm hiatal  hernia; GI suggested surgical repair due to intractable GER.  08/2019 mild chronic gastritis, h pylori neg, multiple benign bx's including duodenum neg for celiac changes.   ESOPHAGOGASTRODUODENOSCOPY N/A 01/18/2021   Procedure: ESOPHAGOGASTRODUODENOSCOPY (EGD);  Surgeon: [name], MD;  Location: [facility] OR;  Service: Thoracic;  Laterality: N/A;   ESOPHAGOGASTRODUODENOSCOPY N/A 05/12/2022   Procedure: ESOPHAGOGASTRODUODENOSCOPY (EGD);  Surgeon: [name], MD;  Location: [facility];  Service: Thoracic;  Laterality: N/A;   Incision and drainage of left thenar space abscess  02/23/2015   s/p cat bite (Dr. Amedeo Plenty)   innominate stent  2007   12/2014 u/s showed patent stent in innominate and L  common carotid.  L prox int carotid 40-59% stenosis   LDCT for lung ca screening  06/2019   NEG->rpt 1 yr   Left common carotid stent  12-17-2004 and 12-17-2005   "                    "                  "                          "                             "   PEXY N/A 05/12/2022   Procedure: GASTROPEXY;  Surgeon: [name], MD;  Location: [place];  Service: Thoracic;  Laterality: N/A;   TRANSTHORACIC ECHOCARDIOGRAM  17-Dec-2005   NORMAL   TUBAL LIGATION     US CAROTID DOPPLER BILATERAL ([place] HX)  06/2019   left stent w/out stenosis.  R ICA sten 40-50% obst-stable.  07/20/21 R clear, L 40-59%, normal vert and subclav flow.  07/2022 stable   XI ROBOTIC ASSISTED HIATAL HERNIA REPAIR N/A 05/12/2022   Procedure: XI ROBOTIC ASSISTED HIATAL HERNIA REPAIR;  Surgeon: [name], MD;  Location: [place];  Service: Thoracic;  Laterality: N/A;   XI ROBOTIC ASSISTED PARAESOPHAGEAL HERNIA REPAIR N/A 01/18/2021   Procedure: XI ROBOTIC ASSISTED PARAESOPHAGEAL HERNIA REPAIR WITH FUNDOPLICATION WITH MESH;  Surgeon: [name], MD;  Location: [place];  Service: Thoracic;  Laterality: N/A;   Family History  Problem Relation Age of Onset   Heart attack Mother    CAD Mother    Heart disease Mother         Before age 56   Hypertension Mother    Hyperlipidemia Mother    Hypertension Father    Pneumonia Father    Hyperlipidemia Father    Heart murmur Sister    Osteoporosis Paternal Grandmother    CAD Paternal Grandfather    Breast cancer Neg Hx    Colon cancer Neg Hx    Esophageal cancer Neg Hx    Rectal cancer Neg Hx    Stomach cancer Neg Hx    Colon polyps Neg Hx    Social History   Socioeconomic History   Marital status: Widowed    Spouse name: Not on file   Number of children: 1   Years of education: Not on file   Highest education level: 12th grade  Occupational History   Not on file  Tobacco Use   Smoking status: Former    Packs/day: 1.25    Years: 38.00    Total pack years: 47.50    Types: Cigarettes    Quit date: 09/27/2007    Years since quitting: 15.0   Smokeless tobacco: Never  Vaping Use   Vaping Use: Never used  Substance and Sexual Activity   Alcohol use: No   Drug use: No   Sexual activity: Not on file  Other Topics Concern   Not on file  Social History Narrative   Widowed as of 12-18-14 (husband died of viral illness while out of town in [place], [place]), has one daughter.   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   Tobacco: 20 pack-yr hx, quit Dec 18, 2007.   Alcohol: rare.   No hx of alc or  drug problems.   Exercise: walks 2.92mles on treadmill daily.   Social Determinants of Health   Financial Resource Strain: Low Risk  (10/12/2022)   Overall Financial Resource Strain (CARDIA)    Difficulty of Paying Living Expenses: Not very hard  Food Insecurity: No Food Insecurity (10/12/2022)   Hunger Vital Sign    Worried About Running Out of Food in the Last Year: Never true    Ran Out of Food in the Last Year: Never true  Transportation Needs: No Transportation Needs (10/12/2022)   PRAPARE - THydrologist(Medical): No    Lack of Transportation (Non-Medical): No  Physical Activity: Sufficiently  Active (10/12/2022)   Exercise Vital Sign    Days of Exercise per Week: 7 days    Minutes of Exercise per Session: 50 min  Stress: No Stress Concern Present (10/12/2022)   [place]   Feeling of Stress : Not at all  Social Connections: Socially Isolated (10/12/2022)   Social Connection and Isolation Panel [NHANES]    Frequency of Communication with Friends and Family: More than three times a week    Frequency of Social Gatherings with Friends and Family: Twice a week    Attends Religious Services: Never    AMarine scientistor Organizations: No    Attends CMusic therapist Not on file    Marital Status: Widowed    Tobacco Counseling Counseling given: Not Answered   Clinical Intake:  Pre-visit preparation completed: No  Pain : No/denies pain     Nutritional Risks: None  How often do you need to have someone help you when you read instructions, pamphlets, or other written materials from your doctor or pharmacy?: 1 - Never  Diabetic?no         Activities of Daily Living    10/12/2022   11:01 AM 10/11/2022    5:54 AM  In your present state of health, do you have any difficulty performing the following activities:  Hearing? 0 0  Vision? 0 0  Difficulty concentrating or making decisions? 0 0  Walking or climbing stairs? 0 0  Dressing or bathing? 0 0  Doing errands, shopping? 0 0  Preparing Food and eating ? N N  Using the Toilet? N N  In the past six months, have you accidently leaked urine? N N  Do you have problems with loss of bowel control? N N  Managing your Medications? N N  Managing your Finances? N N  Housekeeping or managing your Housekeeping? N N    Patient Care Team: MTammi Sou MD as PCP - General (Family Medicine) VJettie Booze MD as PCP - Cardiology (Cardiology) BSerafina Mitchell MD as Consulting Physician (Vascular Surgery) GRoseanne Kaufman MD as  Consulting Physician (Orthopedic Surgery) TLavonna Monarch MD (Inactive) as Consulting Physician (Dermatology) Armbruster, SCarlota Raspberry MD as Consulting Physician (Gastroenterology) CClovis Riley MD as Consulting Physician (General Surgery) SCandee Furbish MD as Consulting Physician (Pulmonary Disease) CLavena Bullion DO as Consulting Physician (Gastroenterology) GJanith Lima MD as Consulting Physician (Urology) RBrand Males MD as Consulting Physician (Pulmonary Disease)  Indicate any recent Medical Services you may have received from other than [facility] providers in the past year (date may be approximate).     Assessment:   This is a routine wellness examination for Quinesha.  Hearing/Vision screen No results found.  Dietary issues and exercise activities discussed: Exercise limited by:  None identified   Goals Addressed   None   Depression Screen    10/12/2022   10:58 AM 10/05/2022    2:36 PM 04/06/2022   11:12 AM 02/09/2022    8:11 AM 10/06/2021    2:37 PM 09/22/2021    9:15 AM 08/26/2020    3:10 PM  PHQ 2/9 Scores  PHQ - 2 Score 0 0 0 0 0 0 0    Fall Risk    10/12/2022   11:00 AM 10/11/2022    5:54 AM 10/05/2022    2:36 PM 04/06/2022   11:12 AM 02/09/2022    8:11 AM  Fall Risk   Falls in the past year? 0 0 0 0 0  Number falls in past yr: 0  0 0 0  Injury with Fall? 0  0 0 0  Risk for fall due to : No Fall Risks  No Fall Risks No Fall Risks   Follow up Falls evaluation completed  Falls evaluation completed Falls evaluation completed Falls evaluation completed    [place]:  Any stairs in or around the home? Yes  If so, are there any without handrails? No  Home free of loose throw rugs in walkways, pet beds, electrical cords, etc? Yes  Adequate lighting in your home to reduce risk of falls? Yes   ASSISTIVE DEVICES UTILIZED TO PREVENT FALLS:  Life alert? No  Use of a cane, walker or w/c? No  Grab bars in the bathroom? Yes   Shower chair or bench in shower? No  Elevated toilet seat or a handicapped toilet? No   TIMED UP AND GO:  Was the test performed? Yes .  Length of time to ambulate 10 feet: 5 sec.   Gait steady and fast without use of assistive device  Cognitive Function:        10/12/2022   10:30 AM 10/06/2021    2:40 PM  6CIT Screen  What Year? 0 points 0 points  What month? 0 points 0 points  What time? 0 points 0 points  Count back from 20 0 points 0 points  Months in reverse 0 points 0 points  Repeat phrase 0 points 0 points  Total Score 0 points 0 points    Immunizations Immunization History  Administered Date(s) Administered   Fluad Quad(high Dose 65+) 06/11/2019, 06/27/2022   Influenza, High Dose Seasonal PF 05/23/2018, 07/21/2020   Influenza-Unspecified 07/01/2021   Moderna Covid-19 Vaccine Bivalent Booster 58yr & up 07/22/2021   Moderna Sars-Covid-2 Vaccination 10/23/2019, 11/20/2019, 07/21/2020   Pfizer Covid-19 Vaccine Bivalent Booster 153yr& up 06/27/2022   Pneumococcal Conjugate-13 05/23/2016   Pneumococcal Polysaccharide-23 05/24/2017   Respiratory Syncytial Virus Vaccine,Recomb Aduvanted(Arexvy) 07/15/2022   Tdap 02/22/2015   Zoster Recombinat (Shingrix) 07/11/2018, 10/18/2018    TDAP status: Up to date  Flu Vaccine status: Up to date  Pneumococcal vaccine status: Up to date  Covid-19 vaccine status: Completed vaccines  Qualifies for Shingles Vaccine? Yes   Zostavax completed No   Shingrix Completed?: Yes  Screening Tests Health Maintenance  Topic Date Due   COVID-19 Vaccine (6 - 2023-24 season) 10/21/2022 (Originally 08/22/2022)   MAMMOGRAM  09/09/2023   Medicare Annual Wellness (AWV)  10/13/2023   COLONOSCOPY (Pts [id number] coverage will need to be confirmed)  09/22/2024   DTaP/Tdap/Td (2 - Td or Tdap) 02/21/2025   Pneumonia Vaccine 65+56ears old  Completed   INFLUENZA VACCINE  Completed   DEXA SCAN  Completed  Hepatitis C Screening   Completed   Zoster Vaccines- Shingrix  Completed   HPV VACCINES  Aged Out   Lung Cancer Screening  Discontinued   Fecal DNA (Cologuard)  Discontinued    Health Maintenance  There are no preventive care reminders to display for this patient.  Colorectal cancer screening: Type of screening: Colonoscopy. Completed 09/23/2019. Repeat every 5 years  Mammogram status: Completed 09/08/22. Repeat every year  Bone Density status: Completed 07/02/22. Results reflect: Bone density results: OSTEOPENIA. Repeat every 2 years.  Lung Cancer Screening: (Low Dose CT Chest recommended if Age 78-80 years, 30 pack-year currently smoking OR have quit w/in 15years.) does not qualify.   Lung Cancer Screening Referral: Completed on 03/08/2022  Additional Screening:  Hepatitis C Screening: does qualify; Completed 01/30/2020  Vision Screening: Recommended annual ophthalmology exams for early detection of glaucoma and other disorders of the eye. Is the patient up to date with their annual eye exam?  Yes  Who is the provider or what is the name of the office in which the patient attends annual eye exams? [facility] If pt is not established with a provider, would they like to be referred to a provider to establish care? No .   Dental Screening: Recommended annual dental exams for proper oral hygiene  Community Resource Referral / Chronic Care Management: CRR required this visit?  No   CCM required this visit?  No      Plan:     I have personally reviewed and noted the following in the patient's chart:   Medical and social history Use of alcohol, tobacco or illicit drugs  Current medications and supplements including opioid prescriptions. Patient is not currently taking opioid prescriptions. Functional ability and status Nutritional status Physical activity Advanced directives List of other physicians Hospitalizations, surgeries, and ER visits in previous 12 months Vitals Screenings to  include cognitive, depression, and falls Referrals and appointments  In addition, I have reviewed and discussed with patient certain preventive protocols, quality metrics, and best practice recommendations. A written personalized care plan for preventive services as well as general preventive health recommendations were provided to patient.     Beatrix Fetters, [place]   10/12/2022   Nurse Notes: Non-Face to Face or Face to Face 5 minute visit Encounter    Ms. [place] , Thank you for taking time to come for your Medicare Wellness Visit. I appreciate your ongoing commitment to your health goals. Please review the following plan we discussed and let me know if I can assist you in the future.   These are the goals we discussed:  Goals      Patient Stated     Maintain or increase current level of activity & cut back on sugar intake.     Patient Stated     Get rid of rebound covid        This is a list of the screening recommended for you and due dates:  Health Maintenance  Topic Date Due   COVID-19 Vaccine (6 - 2023-24 season) 10/21/2022*   Mammogram  09/09/2023   Medicare Annual Wellness Visit  10/13/2023   Colon Cancer Screening  09/22/2024   DTaP/Tdap/Td vaccine (2 - Td or Tdap) 02/21/2025   Pneumonia Vaccine  Completed   Flu Shot  Completed   DEXA scan (bone density measurement)  Completed   Hepatitis C Screening: USPSTF Recommendation to screen - Ages 31-79 yo.  Completed   Zoster (Shingles) Vaccine  Completed   HPV  Vaccine  Aged Out   Screening for Lung Cancer  Discontinued   Cologuard (Stool DNA test)  Discontinued  *Topic was postponed. The date shown is not the original due date.

## 2022-10-11 NOTE — Patient Instructions (Signed)

## 2022-10-12 ENCOUNTER — Ambulatory Visit (INDEPENDENT_AMBULATORY_CARE_PROVIDER_SITE_OTHER): Payer: PPO

## 2022-10-12 ENCOUNTER — Ambulatory Visit (INDEPENDENT_AMBULATORY_CARE_PROVIDER_SITE_OTHER): Payer: PPO | Admitting: Family Medicine

## 2022-10-12 ENCOUNTER — Encounter: Payer: Self-pay | Admitting: Family Medicine

## 2022-10-12 VITALS — BP 87/56 | HR 90 | Temp 97.6°F | Ht 64.0 in | Wt 134.4 lb

## 2022-10-12 VITALS — BP 87/56 | HR 90 | Temp 97.6°F | Ht 64.0 in | Wt 134.0 lb

## 2022-10-12 DIAGNOSIS — Z0000 Encounter for general adult medical examination without abnormal findings: Secondary | ICD-10-CM

## 2022-10-12 DIAGNOSIS — D229 Melanocytic nevi, unspecified: Secondary | ICD-10-CM

## 2022-10-12 DIAGNOSIS — L821 Other seborrheic keratosis: Secondary | ICD-10-CM

## 2022-10-12 NOTE — Progress Notes (Signed)
OFFICE NOTE  10/12/2022  CC:  Chief Complaint  Patient presents with   Procedure    Mole excision    HPI:   Patient is a 72 y.o. Caucasian female who is here for mole excision from R scapula. She is feeling well.  Pertinent PMH:  No hx melanoma  MEDS;   Outpatient Medications Prior to Visit  Medication Sig Dispense Refill   acetaminophen (TYLENOL) 325 MG tablet Take 2 tablets (650 mg total) by mouth every 6 (six) hours as needed for mild pain or moderate pain.     albuterol (VENTOLIN HFA) 108 (90 Base) MCG/ACT inhaler Inhale 2 puffs into the lungs every 6 (six) hours as needed for wheezing or shortness of breath. (Patient not taking: Reported on 08/03/2022) 8 g 6   atorvastatin (LIPITOR) 80 MG tablet TAKE ONE TABLET BY MOUTH DAILY 90 tablet 1   Calcium-Magnesium-Vitamin D (CALCIUM 1200+D3 PO) Take 1 tablet by mouth daily.     clopidogrel (PLAVIX) 75 MG tablet Take 1 tablet (75 mg total) by mouth daily. 90 tablet 1   diazepam (VALIUM) 2 MG tablet TAKE ONE TABLET BY MOUTH TWICE DAILY 60 tablet 5   docusate sodium (COLACE) 100 MG capsule Take 100 mg by mouth daily.     estradiol (ESTRACE) 0.1 MG/GM vaginal cream Place 1 Applicatorful vaginally 2 (two) times a week.     ezetimibe (ZETIA) 10 MG tablet Take 1 tablet (10 mg total) by mouth daily. 90 tablet 1   famotidine (PEPCID) 40 MG tablet TAKE ONE TABLET BY MOUTH DAILY 90 tablet 1   fexofenadine (ALLEGRA) 180 MG tablet Take 180 mg by mouth daily as needed for allergies.     fluticasone (FLONASE) 50 MCG/ACT nasal spray Place 2 sprays into both nostrils daily. 16 g 6   lisinopril (ZESTRIL) 5 MG tablet Take 1 tablet (5 mg total) by mouth daily. 90 tablet 1   Magnesium 500 MG CAPS Take 500 mg by mouth daily.     nitroGLYCERIN (NITROSTAT) 0.4 MG SL tablet Place 1 tablet (0.4 mg total) under the tongue every 5 (five) minutes as needed for chest pain. (Patient not taking: Reported on 08/03/2022) 25 tablet 6   ondansetron (ZOFRAN-ODT) 4 MG  disintegrating tablet Take 1 tablet (4 mg total) by mouth every 6 (six) hours as needed for nausea or vomiting. 60 tablet 3   Probiotic Product (PROBIOTIC DAILY PO) Take 1 capsule by mouth daily.     Tiotropium Bromide Monohydrate (SPIRIVA RESPIMAT) 1.25 MCG/ACT AERS Inhale 2 puffs into the lungs daily. 4 g 3   tolterodine (DETROL LA) 4 MG 24 hr capsule Take 4 mg by mouth daily.     No facility-administered medications prior to visit.    PE:    10/12/2022    9:39 AM 10/05/2022    2:31 PM 08/03/2022    8:19 AM  Vitals with BMI  Height '5\' 4"'$  '5\' 4"'$  '5\' 4"'$   Weight 134 lbs 6 oz 134 lbs 135 lbs 3 oz  BMI 23.06 34.19 37.9  Systolic 87 024 097  Diastolic 56 71 68  Pulse 90 82 82   Right scapula with round light brown nevus, borders without any irregularity.  Pigment homogenous. Size 3 mm diameter.  IMPRESSION AND PLAN:  Procedure: Shave excision of nevus. Consent obtained. Area prepped sterilely. Utilized derma blade to shave under entire lesion (3 mm) and placed in specimen cup.  Patient tolerated procedure well, no immediate complications.  No bleeding. Sent specimen to  pathology.  An After Visit Summary was printed and given to the patient.  FOLLOW UP:  No follow-ups on file.  Signed:  Crissie Sickles, MD           10/12/2022

## 2022-10-13 ENCOUNTER — Telehealth: Payer: Self-pay | Admitting: Gastroenterology

## 2022-10-13 NOTE — Telephone Encounter (Signed)
Pt reports she has had right lower abdominal pain that she attributes to gas. Pt also reports increased belching, nausea without vomiting. Pt is taking zofran, pepcid, and gasx to try and control symptoms. Pt scheduled for follow up with Alonza Bogus PA on 10/18/22 at 1:30 pm. Pt verbalized understanding.

## 2022-10-13 NOTE — Telephone Encounter (Signed)
Inbound call from patient stating that she wanted to make an appointment for stomach pain. Patient was scheduled with Dr. Bryan Lemma for 2/21 at 10:40 but is requesting a call back to discuss if she can be seen sooner. Please advise.

## 2022-10-18 ENCOUNTER — Encounter: Payer: Self-pay | Admitting: Gastroenterology

## 2022-10-18 ENCOUNTER — Ambulatory Visit: Payer: PPO | Admitting: Gastroenterology

## 2022-10-18 VITALS — BP 110/60 | HR 88 | Ht 64.0 in | Wt 134.0 lb

## 2022-10-18 DIAGNOSIS — K5909 Other constipation: Secondary | ICD-10-CM | POA: Diagnosis not present

## 2022-10-18 DIAGNOSIS — R143 Flatulence: Secondary | ICD-10-CM | POA: Insufficient documentation

## 2022-10-18 DIAGNOSIS — R142 Eructation: Secondary | ICD-10-CM | POA: Diagnosis not present

## 2022-10-18 NOTE — Progress Notes (Signed)
10/18/2022 [name] [id number] May 07, 1951   HISTORY OF PRESENT ILLNESS: This is a 72 year old female who is a patient of Dr. Vivia Ewing.  I do not know that he has ever seen her in the office, but performed an EGD on her last in April 2023.  Since then she had a left robotic assisted hiatal hernia repair with a gastropexy with Dr. Kipp Brood from cardiothoracic surgery in August 2023.  She is here today with ongoing complaints of excessive gas in the form of belching and flatulence.  She says that 3 weeks ago she ate a salad that had onions, different kinds of beans, and a lot of vinegar in it.  Says that she had 2 large helpings and that that night she was up all night.  She has also had some nausea.  She has tried Zofran, Pepcid, and Gas-X.  Her PCP told her she probably had IBS and told her that she needed to take some laxatives to clean out.  She did that, but says that the gassiness still persists.  She does admit to being on the constipated side of things, not really having a bowel movement every day and having to strain to move her bowels.  No fixed abdominal pain, but feels that she has pain that starts up under her rib cage and moves down her abdomen so she knows that it is gas pain.   Past Medical History:  Diagnosis Date   Abdominal bruit 04/2017   Aortic u/s showed NO ANEURISM.   Anemia    low iron   Anxiety    Carotid artery occlusion    hx L carotid stent   Cataract    COPD (chronic obstructive pulmonary disease) ([place])    DOE, worse since 11/2018.  Stress test neg 01/2019.  Albut x 1 inh prn helpful some.  Pulm 06/2019, PFTs not diag of COPD, DOE likely from deconditioning.   Coronary artery disease    Essential hypertension, benign 05/22/2014   GERD (gastroesophageal reflux disease)    + hx of esophagitis   [name] syndrome    H/O hiatal hernia 02/2019   EGD-->moderate size->surgery recommended but pt declined.   Hematochezia 2020/21   internal hemorrhoids    History of iron deficiency anemia 07/2019   Dr. Bryan Lemma EGD and colonoscopy, some anorectal muscosal ulceration, o/w just mild chronic gastritis.   Also could be poor absorption due to chronic high dose acid suppression. Hb/iron normalized aft iron infus and ongoing oral iron.   History of kidney stones    one time   ILD (interstitial lung disease) ([place])    Detected on lung cancer screening CT 01/2022.  ILD blood panel all normal except weakly + myositis ab's.  Pt to be evaluated by Dr. Chase Caller asymptomatic as of 03/2022.   Mitral valve prolapse    Mixed hyperlipidemia 05/22/2014   OAB (overactive bladder)    detrol LA from urol helpful   Osteopenia 06/06/2016   05/2016 DEXA T-score -1.5.  06/2018 DEXA T score -1.1.  T score -1.2 06/2021   Peripheral vascular disease, unspecified ([place]) 05/22/2014   innominate and carotid.  U/S f/u 12/2016 showed no signif change compared to 2017--vascular recommended repeat 1 yr.  Doing well as of 08/2018 cardiol f/u-->continued on ASA and Plavix. F/u 08/2019->ASA d/c'd, plavix continued.   Prediabetes 2018   2018 A1c 6.3%.  05/2018 A1c 6.1%. 07/2019 A1c 6.4%.   Raynaud disease    Seasonal allergies    TIA (  transient ischemic attack) 2006   Past Surgical History:  Procedure Laterality Date   ABDOMINAL HYSTERECTOMY  1997   CARDIAC CATHETERIZATION  01/2012   NORMAL   CARDIOVASCULAR STRESS TEST  02/05/2019   NORMAL.  EF normal.   CAROTID ENDARTERECTOMY  08/21/2007   right - Dr. Amedeo Plenty   CHOLECYSTECTOMY  2000   COLONOSCOPY  approx 2008; 09/23/19   2008 (Dr. Lenon Ahmadi pt->normal.  08/2019 adenoma, some friable areas of colon + anorectal mucosa inflam w/ ulceration, +diverticulosis. Recall 5 yrs   DEXA  06/06/2016   05/2016 T-score -1.5.  06/2018 T score -1.1.  T score -1.2 06/2021.   ESOPHAGOGASTRODUODENOSCOPY  03/11/2019; 09/23/19   02/2019; 5 cm hiatal hernia; GI suggested surgical repair due to intractable GER.  08/2019 mild chronic gastritis, h  pylori neg, multiple benign bx's including duodenum neg for celiac changes.   ESOPHAGOGASTRODUODENOSCOPY N/A 01/18/2021   Procedure: ESOPHAGOGASTRODUODENOSCOPY (EGD);  Surgeon: [name], MD;  Location: [facility] OR;  Service: Thoracic;  Laterality: N/A;   ESOPHAGOGASTRODUODENOSCOPY N/A 05/12/2022   Procedure: ESOPHAGOGASTRODUODENOSCOPY (EGD);  Surgeon: [name], MD;  Location: [place];  Service: Thoracic;  Laterality: N/A;   Incision and drainage of left thenar space abscess  02/23/2015   s/p cat bite (Dr. Amedeo Plenty)   innominate stent  2007   12/2014 u/s showed patent stent in innominate and L common carotid.  L prox int carotid 40-59% stenosis   LDCT for lung ca screening  06/2019   NEG->rpt 1 yr   Left common carotid stent  2006 and 2007   "                    "                  "                          "                             "   PEXY N/A 05/12/2022   Procedure: GASTROPEXY;  Surgeon: [name], MD;  Location: [place];  Service: Thoracic;  Laterality: N/A;   TRANSTHORACIC ECHOCARDIOGRAM  2007   NORMAL   TUBAL LIGATION     US CAROTID DOPPLER BILATERAL ([place] HX)  06/2019   left stent w/out stenosis.  R ICA sten 40-50% obst-stable.  07/20/21 R clear, L 40-59%, normal vert and subclav flow.  07/2022 stable   XI ROBOTIC ASSISTED HIATAL HERNIA REPAIR N/A 05/12/2022   Procedure: XI ROBOTIC ASSISTED HIATAL HERNIA REPAIR;  Surgeon: [name], MD;  Location: [place];  Service: Thoracic;  Laterality: N/A;   XI ROBOTIC ASSISTED PARAESOPHAGEAL HERNIA REPAIR N/A 01/18/2021   Procedure: XI ROBOTIC ASSISTED PARAESOPHAGEAL HERNIA REPAIR WITH FUNDOPLICATION WITH MESH;  Surgeon: [name], MD;  Location: [place];  Service: Thoracic;  Laterality: N/A;    reports that she quit smoking about 15 years ago. Her smoking use included cigarettes. She has a 47.50 pack-year smoking history. She has never used smokeless tobacco. She reports that she does not drink alcohol  and does not use drugs. family history includes CAD in her mother and paternal grandfather; Heart attack in her mother; Heart disease in her mother; Heart murmur in her sister; Hyperlipidemia in her father and mother; Hypertension in her father and mother; Osteoporosis in her paternal  grandmother; Pneumonia in her father. Allergies  Allergen Reactions   Doxycycline Shortness Of Breath and Swelling    Lips swelling and short of breath   Demerol Hcl [Meperidine] Nausea And Vomiting   Fentanyl Nausea And Vomiting    Can tolerate with nausea meds  Pt states all pain medications cause N/V for her    Fluocinolone Nausea And Vomiting   Penicillins Hives   Breo Ellipta [Fluticasone Furoate-Vilanterol] Other (See Comments)    Lower extremity weakness, lightheadedness    Imdur [Isosorbide Nitrate] Nausea And Vomiting      Outpatient Encounter Medications as of 10/18/2022  Medication Sig   acetaminophen (TYLENOL) 325 MG tablet Take 2 tablets (650 mg total) by mouth every 6 (six) hours as needed for mild pain or moderate pain.   albuterol (VENTOLIN HFA) 108 (90 Base) MCG/ACT inhaler Inhale 2 puffs into the lungs every 6 (six) hours as needed for wheezing or shortness of breath.   atorvastatin (LIPITOR) 80 MG tablet TAKE ONE TABLET BY MOUTH DAILY   Calcium-Magnesium-Vitamin D (CALCIUM 1200+D3 PO) Take 1 tablet by mouth daily.   clopidogrel (PLAVIX) 75 MG tablet Take 1 tablet (75 mg total) by mouth daily.   diazepam (VALIUM) 2 MG tablet TAKE ONE TABLET BY MOUTH TWICE DAILY   docusate sodium (COLACE) 100 MG capsule Take 100 mg by mouth daily.   estradiol (ESTRACE) 0.1 MG/GM vaginal cream Place 1 Applicatorful vaginally 2 (two) times a week.   ezetimibe (ZETIA) 10 MG tablet Take 1 tablet (10 mg total) by mouth daily.   famotidine (PEPCID) 40 MG tablet TAKE ONE TABLET BY MOUTH DAILY   fexofenadine (ALLEGRA) 180 MG tablet Take 180 mg by mouth daily as needed for allergies.   fluticasone (FLONASE) 50  MCG/ACT nasal spray Place 2 sprays into both nostrils daily.   lisinopril (ZESTRIL) 5 MG tablet Take 1 tablet (5 mg total) by mouth daily.   Magnesium 500 MG CAPS Take 500 mg by mouth daily.   nitroGLYCERIN (NITROSTAT) 0.4 MG SL tablet Place 1 tablet (0.4 mg total) under the tongue every 5 (five) minutes as needed for chest pain.   ondansetron (ZOFRAN-ODT) 4 MG disintegrating tablet Take 1 tablet (4 mg total) by mouth every 6 (six) hours as needed for nausea or vomiting.   Probiotic Product (PROBIOTIC DAILY PO) Take 1 capsule by mouth daily.   Tiotropium Bromide Monohydrate (SPIRIVA RESPIMAT) 1.25 MCG/ACT AERS Inhale 2 puffs into the lungs daily.   tolterodine (DETROL LA) 4 MG 24 hr capsule Take 4 mg by mouth daily.   No facility-administered encounter medications on file as of 10/18/2022.     REVIEW OF SYSTEMS  : All other systems reviewed and negative except where noted in the History of Present Illness.   PHYSICAL EXAM: BP 110/60   Pulse 88   Ht '5\' 4"'$  (1.626 m)   Wt 134 lb (60.8 kg)   BMI 23.00 kg/m  General: Well developed white female in no acute distress Head: Normocephalic and atraumatic Eyes:  Sclerae anicteric, conjunctiva pink. Ears: Normal auditory acuity Lungs: Clear throughout to auscultation; no W/R/R. Heart: Regular rate and rhythm; no M/R/G. Abdomen: Soft, non-distended.  BS present and very hyperactive.  Non-tender. Musculoskeletal: Symmetrical with no gross deformities  Skin: No lesions on visible extremities Extremities: No edema  Neurological: Alert oriented x 4, grossly non-focal Psychological:  Alert and cooperative. Normal mood and affect  ASSESSMENT AND PLAN: *Gas with flatulence and belching: Says that she has tried Zofran, Pepcid,  Gas-X.  Will try IBgard and FDgard (samples given), alternating to see if those will help.  We also discussed the FODMAP diet and gave literature if she is willing to do that. *Constipation: Needs to be using something to help  her move her bowels more regularly.  We had recommended MiraLAX in the past which she can do 1 capful mixed in 8 ounces of liquid daily and titrate up or down as needed.  Otherwise her PCP had recommended Senokot-S, which she can use 1 or 2 those daily if she prefers.  **We will follow-up in about 6 weeks.   CC:  McGowen, Adrian Blackwater, MD

## 2022-10-18 NOTE — Patient Instructions (Signed)
FBgard use as directed.   IBgard use as directed.  May use Senkot or Miralax daily for constipation.   Follow up with Dr. Bryan Lemma in 6 weeks.   _______________________________________________________  If your blood pressure at your visit was 140/90 or greater, please contact your primary care physician to follow up on this.  _______________________________________________________  If you are age 72 or older, your body mass index should be between 23-30. Your Body mass index is 23 kg/m. If this is out of the aforementioned range listed, please consider follow up with your Primary Care Provider.  If you are age 83 or younger, your body mass index should be between 19-25. Your Body mass index is 23 kg/m. If this is out of the aformentioned range listed, please consider follow up with your Primary Care Provider.   ________________________________________________________  The [facility] providers would like to encourage you to use [facility] to communicate with providers for non-urgent requests or questions.  Due to long hold times on the telephone, sending your provider a message by [facility] may be a faster and more efficient way to get a response.  Please allow 48 business hours for a response.  Please remember that this is for non-urgent requests.  _______________________________________________________

## 2022-10-19 NOTE — Progress Notes (Signed)
Agree with the assessment and plan as outlined by Jessica Zehr, PA-C. ? ?Tobias Avitabile, DO, FACG ? ?

## 2022-10-21 ENCOUNTER — Ambulatory Visit: Payer: PPO | Attending: Rheumatology | Admitting: Rheumatology

## 2022-10-21 ENCOUNTER — Encounter: Payer: Self-pay | Admitting: Rheumatology

## 2022-10-21 VITALS — BP 126/80 | HR 83 | Resp 15 | Ht 64.0 in | Wt 132.0 lb

## 2022-10-21 DIAGNOSIS — R040 Epistaxis: Secondary | ICD-10-CM

## 2022-10-21 DIAGNOSIS — Z87891 Personal history of nicotine dependence: Secondary | ICD-10-CM | POA: Diagnosis not present

## 2022-10-21 DIAGNOSIS — I7300 Raynaud's syndrome without gangrene: Secondary | ICD-10-CM

## 2022-10-21 DIAGNOSIS — I739 Peripheral vascular disease, unspecified: Secondary | ICD-10-CM

## 2022-10-21 DIAGNOSIS — J438 Other emphysema: Secondary | ICD-10-CM | POA: Diagnosis not present

## 2022-10-21 DIAGNOSIS — M8589 Other specified disorders of bone density and structure, multiple sites: Secondary | ICD-10-CM | POA: Diagnosis not present

## 2022-10-21 DIAGNOSIS — M546 Pain in thoracic spine: Secondary | ICD-10-CM

## 2022-10-21 DIAGNOSIS — M5136 Other intervertebral disc degeneration, lumbar region: Secondary | ICD-10-CM

## 2022-10-21 DIAGNOSIS — K449 Diaphragmatic hernia without obstruction or gangrene: Secondary | ICD-10-CM | POA: Diagnosis not present

## 2022-10-21 DIAGNOSIS — I779 Disorder of arteries and arterioles, unspecified: Secondary | ICD-10-CM | POA: Diagnosis not present

## 2022-10-21 DIAGNOSIS — K1370 Unspecified lesions of oral mucosa: Secondary | ICD-10-CM

## 2022-10-21 DIAGNOSIS — M51369 Other intervertebral disc degeneration, lumbar region without mention of lumbar back pain or lower extremity pain: Secondary | ICD-10-CM

## 2022-10-21 DIAGNOSIS — I10 Essential (primary) hypertension: Secondary | ICD-10-CM

## 2022-10-21 DIAGNOSIS — Z9889 Other specified postprocedural states: Secondary | ICD-10-CM

## 2022-10-21 DIAGNOSIS — E782 Mixed hyperlipidemia: Secondary | ICD-10-CM

## 2022-10-21 NOTE — Patient Instructions (Signed)
Raynaud's Phenomenon  Raynaud's phenomenon is a condition that affects the blood vessels (arteries) that carry blood to the fingers and toes. The arteries that supply blood to the ears, lips, nipples, or the tip of the nose might also be affected. Raynaud's phenomenon causes the arteries to become narrow temporarily (spasm). As a result, the flow of blood to the affected areas is temporarily decreased. This usually occurs in response to cold temperatures or stress. During an attack, the skin in the affected areas turns white, then blue, and finally red. A person may also feel tingling or numbness in those areas. Attacks usually last for only a brief period, and then the blood flow to the area returns to normal. In most cases, Raynaud's phenomenon does not cause serious health problems. What are the causes? In many cases, the cause of this condition is not known. The condition may occur on its own (primary Raynaud's phenomenon) or may be associated with other diseases or factors (secondary Raynaud's phenomenon). Possible causes may include: Diseases or medical conditions that damage the arteries. Injuries and repetitive actions that hurt the hands or feet. Being exposed to certain chemicals. Taking medicines that narrow the arteries. Other medical conditions, such as lupus, scleroderma, rheumatoid arthritis, thyroid problems, blood disorders, Sjogren syndrome, or atherosclerosis. What increases the risk? The following factors may make you more likely to develop this condition: Being 20-40 years old. Being female. Having a family history of Raynaud's phenomenon. Living in a cold climate. Smoking. What are the signs or symptoms? Symptoms of this condition usually occur when you are exposed to cold temperatures or when you have emotional stress. The symptoms may last for a few minutes or up to several hours. They usually affect your fingers but may also affect your toes, nipples, lips, ears, or the  tip of your nose. Symptoms may include: Changes in skin color. The skin in the affected areas will turn pale or white. The skin may then change from white to bluish to red as normal blood flow returns to the area. Numbness, tingling, or pain in the affected areas. In severe cases, symptoms may include: Skin sores. Tissues decaying and dying (gangrene). How is this diagnosed? This condition may be diagnosed based on: Your symptoms and medical history. A physical exam. During the exam, you may be asked to put your hands in cold water to check for a reaction to cold temperature. Tests, such as: Blood tests to check for other diseases or conditions. A test to check the movement of blood through your arteries and veins (vascular ultrasound). A test in which the skin at the base of your fingernail is examined under a microscope (nailfold capillaroscopy). How is this treated? During an episode, you can take actions to help symptoms go away faster. Options include moving your arms around in a windmill pattern, warming your fingers under warm water, or placing your fingers in a warm body fold, such as your armpit. Long-term treatment for this condition often involves making lifestyle changes and taking steps to control your exposure to cold temperature. For more severe cases, medicine (calcium channel blockers) may be used to improve blood circulation. Follow these instructions at home: Avoiding cold temperatures Take these steps to avoid exposure to cold: If possible, stay indoors during cold weather. When you go outside during cold weather, dress in layers and wear mittens, a hat, a scarf, and warm footwear. Wear mittens or gloves when handling ice or frozen food. Use holders for glasses or cans containing   cold drinks. Let warm water run for a while before taking a shower or bath. Warm up the car before driving in cold weather. Lifestyle If possible, avoid stressful and emotional situations. Try  to find ways to manage your stress, such as: Exercise. Yoga. Meditation. Biofeedback. Do not use any products that contain nicotine or tobacco. These products include cigarettes, chewing tobacco, and vaping devices, such as e-cigarettes. If you need help quitting, ask your health care provider. Avoid secondhand smoke. Limit your use of caffeine. Switch to decaffeinated coffee, tea, and soda. Avoid chocolate. Avoid vibrating tools and machinery. General instructions Protect your hands and feet from injuries, cuts, or bruises. Avoid wearing tight rings or wristbands. Wear loose fitting socks and comfortable, roomy shoes. Take over-the-counter and prescription medicines only as told by your health care provider. Where to find support Raynaud's Association: www.raynauds.org Where to find more information [employer] of Arthritis and Musculoskeletal and Skin Diseases: www.niams.SouthExposed.es Contact a health care provider if: Your discomfort becomes worse despite lifestyle changes. You develop sores on your fingers or toes that do not heal. You have breaks in the skin on your fingers or toes. You have a fever. You have pain or swelling in your joints. You have a rash. Your symptoms occur on only one side of your body. Get help right away if: Your fingers or toes turn black. You have severe pain in the affected areas. These symptoms may represent a serious problem that is an emergency. Do not wait to see if the symptoms will go away. Get medical help right away. Call your local emergency services (911 in the [place] Do not drive yourself to the hospital. Summary Raynaud's phenomenon is a condition that affects the arteries that carry blood to the fingers, toes, ears, lips, nipples, or the tip of the nose. In many cases, the cause of this condition is not known. Symptoms of this condition include changes in skin color along with numbness and tingling in the affected area. Treatment for  this condition includes lifestyle changes and reducing exposure to cold temperatures. Medicines may be used for severe cases of the condition. Contact your health care provider if your condition worsens despite treatment. This information is not intended to replace advice given to you by your health care provider. Make sure you discuss any questions you have with your health care provider. Document Revised: 11/17/2020 Document Reviewed: 11/17/2020 Elsevier Patient Education  [place]. Back Exercises The following exercises strengthen the muscles that help to support the trunk (torso) and back. They also help to keep the lower back flexible. Doing these exercises can help to prevent or lessen existing low back pain. If you have back pain or discomfort, try doing these exercises 2-3 times each day or as told by your health care provider. As your pain improves, do them once each day, but increase the number of times that you repeat the steps for each exercise (do more repetitions). To prevent the recurrence of back pain, continue to do these exercises once each day or as told by your health care provider. Do exercises exactly as told by your health care provider and adjust them as directed. It is normal to feel mild stretching, pulling, tightness, or discomfort as you do these exercises, but you should stop right away if you feel sudden pain or your pain gets worse. Exercises Single knee to chest Repeat these steps 3-5 times for each leg: Lie on your back on a firm bed or the floor  with your legs extended. Bring one knee to your chest. Your other leg should stay extended and in contact with the floor. Hold your knee in place by grabbing your knee or thigh with both hands and hold. Pull on your knee until you feel a gentle stretch in your lower back or buttocks. Hold the stretch for 10-30 seconds. Slowly release and straighten your leg.  Pelvic tilt Repeat these steps 5-10 times: Lie on  your back on a firm bed or the floor with your legs extended. Bend your knees so they are pointing toward the ceiling and your feet are flat on the floor. Tighten your lower abdominal muscles to press your lower back against the floor. This motion will tilt your pelvis so your tailbone points up toward the ceiling instead of pointing to your feet or the floor. With gentle tension and even breathing, hold this position for 5-10 seconds.  Cat-cow Repeat these steps until your lower back becomes more flexible: Get into a hands-and-knees position on a firm bed or the floor. Keep your hands under your shoulders, and keep your knees under your hips. You may place padding under your knees for comfort. Let your head hang down toward your chest. Contract your abdominal muscles and point your tailbone toward the floor so your lower back becomes rounded like the back of a cat. Hold this position for 5 seconds. Slowly lift your head, let your abdominal muscles relax, and point your tailbone up toward the ceiling so your back forms a sagging arch like the back of a cow. Hold this position for 5 seconds.  Press-ups Repeat these steps 5-10 times: Lie on your abdomen (face-down) on a firm bed or the floor. Place your palms near your head, about shoulder-width apart. Keeping your back as relaxed as possible and keeping your hips on the floor, slowly straighten your arms to raise the top half of your body and lift your shoulders. Do not use your back muscles to raise your upper torso. You may adjust the placement of your hands to make yourself more comfortable. Hold this position for 5 seconds while you keep your back relaxed. Slowly return to lying flat on the floor.  Bridges Repeat these steps 10 times: Lie on your back on a firm bed or the floor. Bend your knees so they are pointing toward the ceiling and your feet are flat on the floor. Your arms should be flat at your sides, next to your body. Tighten  your buttocks muscles and lift your buttocks off the floor until your waist is at almost the same height as your knees. You should feel the muscles working in your buttocks and the back of your thighs. If you do not feel these muscles, slide your feet 1-2 inches (2.5-5 cm) farther away from your buttocks. Hold this position for 3-5 seconds. Slowly lower your hips to the starting position, and allow your buttocks muscles to relax completely. If this exercise is too easy, try doing it with your arms crossed over your chest. Abdominal crunches Repeat these steps 5-10 times: Lie on your back on a firm bed or the floor with your legs extended. Bend your knees so they are pointing toward the ceiling and your feet are flat on the floor. Cross your arms over your chest. Tip your chin slightly toward your chest without bending your neck. Tighten your abdominal muscles and slowly raise your torso high enough to lift your shoulder blades a tiny bit off the floor.  Avoid raising your torso higher than that because it can put too much stress on your lower back and does not help to strengthen your abdominal muscles. Slowly return to your starting position.  Back lifts Repeat these steps 5-10 times: Lie on your abdomen (face-down) with your arms at your sides, and rest your forehead on the floor. Tighten the muscles in your legs and your buttocks. Slowly lift your chest off the floor while you keep your hips pressed to the floor. Keep the back of your head in line with the curve in your back. Your eyes should be looking at the floor. Hold this position for 3-5 seconds. Slowly return to your starting position.  Contact a health care provider if: Your back pain or discomfort gets much worse when you do an exercise. Your worsening back pain or discomfort does not lessen within 2 hours after you exercise. If you have any of these problems, stop doing these exercises right away. Do not do them again unless your  health care provider says that you can. Get help right away if: You develop sudden, severe back pain. If this happens, stop doing the exercises right away. Do not do them again unless your health care provider says that you can. This information is not intended to replace advice given to you by your health care provider. Make sure you discuss any questions you have with your health care provider. Document Revised: 03/09/2021 Document Reviewed: 11/25/2020 Elsevier Patient Education  [place].

## 2022-10-27 ENCOUNTER — Ambulatory Visit: Payer: PPO | Admitting: Podiatry

## 2022-10-28 LAB — ANA: Anti Nuclear Antibody (ANA): POSITIVE — AB

## 2022-10-28 LAB — BETA-2 GLYCOPROTEIN ANTIBODIES
Beta-2 Glyco 1 IgA: <2 U/mL (ref <20.0)
Beta-2 Glyco 1 IgM: <2 U/mL (ref <20.0)
Beta-2 Glyco I IgG: <2 U/mL (ref <20.0)

## 2022-10-28 LAB — CARDIOLIPIN ANTIBODIES, IGG, IGM, IGA
Anticardiolipin IgA: <2 APL-U/mL (ref <20.0)
Anticardiolipin IgG: <2 GPL-U/mL (ref <20.0)
Anticardiolipin IgM: <2 MPL-U/mL (ref <20.0)

## 2022-10-28 LAB — ANTI-NUCLEAR AB-TITER (ANA TITER)
ANA TITER: 1:40 {titer} — ABNORMAL HIGH
ANA Titer 1: 1:40 {titer} — ABNORMAL HIGH

## 2022-10-28 LAB — C3 AND C4
C3 Complement: 143 mg/dL (ref 83–193)
C4 Complement: 30 mg/dL (ref 15–57)

## 2022-10-28 LAB — RNP ANTIBODY: Ribonucleic Protein(ENA) Antibody, IgG: <1 AI

## 2022-10-28 LAB — CRYOGLOBULIN: Cryoglobulin, Qualitative Analysis: NOT DETECTED

## 2022-10-29 NOTE — Progress Notes (Signed)
I will discuss results at the follow-up visit.

## 2022-11-01 NOTE — Progress Notes (Signed)
Office Visit Note  Patient: [name]             Date of Birth: 1951/08/03           MRN: [id number]             PCP: Tammi Sou, MD Referring: Tammi Sou, MD Visit Date: 11/15/2022 Occupation: @GUAROCC$ @  Subjective:  Raynauds phenomenon  History of Present Illness: [name] is a 72 y.o. female with history of raynauds phenomenon patient states her symptoms are mild and tolerable.  She denies any history of digital ulcers.  She denies history of skin tightness.  There is no history of oral ulcers, nasal ulcers, malar rash, photosensitivity, lymphadenopathy or inflammatory arthritis. She has been practicing yoga since her last visit which has helped with thoracic and lumbar pain.  She has been exercising daily and does yard work. Patient states that she has been experiencing constipation bloating and gas.  She has an appointment coming up with her gastroenterologist.  Activities of Daily Living:  Patient reports morning stiffness for 0 minute.   Patient Denies nocturnal pain.  Difficulty dressing/grooming: Denies Difficulty climbing stairs: Denies Difficulty getting out of chair: Denies Difficulty using hands for taps, buttons, cutlery, and/or writing: Denies  Review of Systems  Constitutional:  Negative for fatigue.  HENT:  Positive for mouth dryness. Negative for mouth sores.   Eyes:  Negative for dryness.  Respiratory:  Positive for shortness of breath.   Cardiovascular:  Negative for chest pain and palpitations.  Gastrointestinal:  Positive for constipation.  Endocrine: Negative for increased urination.  Genitourinary:  Negative for difficulty urinating.  Musculoskeletal:  Negative for joint pain, gait problem, joint pain, joint swelling, myalgias, morning stiffness and myalgias.  Skin:  Positive for color change. Negative for rash and sensitivity to sunlight.  Allergic/Immunologic: Negative for susceptible to infections.  Neurological:  Negative  for weakness.  Hematological:  Negative for swollen glands.  Psychiatric/Behavioral:  Negative for depressed mood and sleep disturbance. The patient is not nervous/anxious.     PMFS History:  Patient Active Problem List   Diagnosis Date Noted   Flatulence 10/18/2022   Belching 10/18/2022   Other constipation 10/18/2022   S/P robot-assisted surgical procedure 05/12/2022   Other emphysema ([place]) 05/10/2021   Former smoker 05/10/2021   Hiatal hernia 01/18/2021   Overweight (BMI 25.0-29.9) 08/01/2019   DOE (dyspnea on exertion) 05/25/2019   Oral lesion 02/20/2019   Epistaxis, recurrent 02/20/2019   Mixed hyperlipidemia 05/22/2014   Essential hypertension, benign 05/22/2014   Peripheral vascular disease ([place]) 05/22/2014   Aftercare following surgery of the circulatory system, NEC 09/17/2013   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   Anemia    low iron   Anxiety    Carotid artery occlusion    hx L carotid stent   Cataract    COPD (chronic obstructive pulmonary disease) ([place])    DOE, worse since 11/2018.  Stress test neg 01/2019.  Albut x 1 inh prn helpful some.  Pulm 06/2019, PFTs not diag of COPD, DOE likely from deconditioning.   Coronary artery disease    Essential hypertension, benign 05/22/2014   GERD (gastroesophageal reflux disease)    + hx of esophagitis   [name] syndrome    H/O hiatal hernia 02/2019   EGD-->moderate size->surgery recommended but pt declined.  Hematochezia 2020/21   internal hemorrhoids   History of iron deficiency anemia 07/2019   Dr. Bryan Lemma EGD and colonoscopy, some anorectal muscosal ulceration, o/w just mild chronic gastritis.   Also could be poor absorption due to chronic high dose acid suppression. Hb/iron normalized aft iron infus and ongoing oral iron.   History of kidney stones    one time   ILD  (interstitial lung disease) ([place])    Detected on lung cancer screening CT 01/2022.  ILD blood panel all normal except weakly + myositis ab's.  Pt to be evaluated by Dr. Chase Caller asymptomatic as of 03/2022.   Mitral valve prolapse    Mixed hyperlipidemia 05/22/2014   OAB (overactive bladder)    detrol LA from urol helpful   Osteopenia 06/06/2016   05/2016 DEXA T-score -1.5.  06/2018 DEXA T score -1.1.  T score -1.2 06/2021   Peripheral vascular disease, unspecified ([place]) 05/22/2014   innominate and carotid.  U/S f/u 12/2016 showed no signif change compared to 2017--vascular recommended repeat 1 yr.  Doing well as of 08/2018 cardiol f/u-->continued on ASA and Plavix. F/u 08/2019->ASA d/c'd, plavix continued.   Prediabetes 2018   2018 A1c 6.3%.  05/2018 A1c 6.1%. 07/2019 A1c 6.4%.   Raynaud disease    Seasonal allergies    TIA (transient ischemic attack) 2006    Family History  Problem Relation Age of Onset   Heart attack Mother    CAD Mother    Heart disease Mother        Before age 29   Hypertension Mother    Hyperlipidemia Mother    Hypertension Father    Pneumonia Father    Hyperlipidemia Father    Heart murmur Sister    Osteoporosis Paternal Grandmother    CAD Paternal Grandfather    Breast cancer Neg Hx    Colon cancer Neg Hx    Esophageal cancer Neg Hx    Rectal cancer Neg Hx    Stomach cancer Neg Hx    Colon polyps Neg Hx    Past Surgical History:  Procedure Laterality Date   ABDOMINAL HYSTERECTOMY  1997   CARDIAC CATHETERIZATION  01/2012   NORMAL   CARDIOVASCULAR STRESS TEST  02/05/2019   NORMAL.  EF normal.   CAROTID ENDARTERECTOMY  08/21/2007   right - Dr. Amedeo Plenty   CHOLECYSTECTOMY  2000   COLONOSCOPY  approx 2008; 09/23/19   2008 (Dr. Lenon Ahmadi pt->normal.  08/2019 adenoma, some friable areas of colon + anorectal mucosa inflam w/ ulceration, +diverticulosis. Recall 5 yrs   DEXA  06/06/2016   05/2016 T-score -1.5.  06/2018 T score -1.1.  T score -1.2 06/2021.    ESOPHAGOGASTRODUODENOSCOPY  03/11/2019; 09/23/19   02/2019; 5 cm hiatal hernia; GI suggested surgical repair due to intractable GER.  08/2019 mild chronic gastritis, h pylori neg, multiple benign bx's including duodenum neg for celiac changes.   ESOPHAGOGASTRODUODENOSCOPY N/A 01/18/2021   Procedure: ESOPHAGOGASTRODUODENOSCOPY (EGD);  Surgeon: [name], MD;  Location: [facility] OR;  Service: Thoracic;  Laterality: N/A;   ESOPHAGOGASTRODUODENOSCOPY N/A 05/12/2022   Procedure: ESOPHAGOGASTRODUODENOSCOPY (EGD);  Surgeon: [name], MD;  Location: [facility];  Service: Thoracic;  Laterality: N/A;   Incision and drainage of left thenar space abscess  02/23/2015   s/p cat bite (Dr. Amedeo Plenty)   innominate stent  2007   12/2014 u/s showed patent stent in innominate and L common carotid.  L prox int carotid 40-59% stenosis   LDCT for lung ca screening  06/2019  NEG->rpt 1 yr   Left common carotid stent  12-20-04 and 12/20/2005   "                    "                  "                          "                             "   PEXY N/A 05/12/2022   Procedure: GASTROPEXY;  Surgeon: [name], MD;  Location: [facility];  Service: Thoracic;  Laterality: N/A;   TRANSTHORACIC ECHOCARDIOGRAM  2005-12-20   NORMAL   TUBAL LIGATION     US CAROTID DOPPLER BILATERAL ([place] HX)  06/2019   left stent w/out stenosis.  R ICA sten 40-50% obst-stable.  07/20/21 R clear, L 40-59%, normal vert and subclav flow.  07/2022 stable   XI ROBOTIC ASSISTED HIATAL HERNIA REPAIR N/A 05/12/2022   Procedure: XI ROBOTIC ASSISTED HIATAL HERNIA REPAIR;  Surgeon: [name], MD;  Location: [facility] OR;  Service: Thoracic;  Laterality: N/A;   XI ROBOTIC ASSISTED PARAESOPHAGEAL HERNIA REPAIR N/A 01/18/2021   Procedure: XI ROBOTIC ASSISTED PARAESOPHAGEAL HERNIA REPAIR WITH FUNDOPLICATION WITH MESH;  Surgeon: [name], MD;  Location: [facility] OR;  Service: Thoracic;  Laterality: N/A;   Social History   Social History Narrative    Widowed as of 21-Dec-2014 (husband died of viral illness while out of town in [place], [place]), has one daughter.   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   Tobacco: 20 pack-yr hx, quit 21-Dec-2007.   Alcohol: rare.   No hx of alc or drug problems.   Exercise: walks 2.80mles on treadmill daily.   Immunization History  Administered Date(s) Administered   Fluad Quad(high Dose 65+) 06/11/2019, 06/27/2022   Influenza, High Dose Seasonal PF 05/23/2018, 07/21/2020   Influenza-Unspecified 07/01/2021   Moderna Covid-19 Vaccine Bivalent Booster 116yr& up 07/22/2021   Moderna Sars-Covid-2 Vaccination 10/23/2019, 11/20/2019, 07/21/2020   Pfizer Covid-19 Vaccine Bivalent Booster 1224yr up 06/27/2022   Pneumococcal Conjugate-13 05/23/2016   Pneumococcal Polysaccharide-23 05/24/2017   Respiratory Syncytial Virus Vaccine,Recomb Aduvanted(Arexvy) 07/15/2022   Tdap 02/22/2015   Zoster Recombinat (Shingrix) 07/11/2018, 10/18/2018     Objective: Vital Signs: BP 115/79 (BP Location: Left Arm, Patient Position: Sitting, Cuff Size: Normal)   Pulse 78   Resp 16   Ht 5' 4"$  (1.626 m)   Wt 132 lb (59.9 kg)   BMI 22.66 kg/m    Physical Exam Vitals and nursing note reviewed.  Constitutional:      Appearance: She is well-developed.  HENT:     Head: Normocephalic and atraumatic.  Eyes:     Conjunctiva/sclera: Conjunctivae normal.  Cardiovascular:     Rate and Rhythm: Normal rate and regular rhythm.     Heart sounds: Normal heart sounds.  Pulmonary:     Effort: Pulmonary effort is normal.     Breath sounds: Normal breath sounds.  Abdominal:     General: Bowel sounds are normal.     Palpations: Abdomen is soft.  Musculoskeletal:     Cervical back: Normal range of motion.  Lymphadenopathy:     Cervical: No cervical adenopathy.  Skin:    General:  Skin is warm and dry.     Capillary Refill: Capillary refill takes 2 to 3 seconds.     Comments: No sclerodactyly or  Telengectesia were noted.  Neurological:     Mental Status: She is alert and oriented to person, place, and time.  Psychiatric:        Behavior: Behavior normal.      Musculoskeletal Exam: Cervical, thoracic and lumbar spine were in good range of motion.  Mild thoracic kyphosis was noted.  Shoulder joints, elbow joints, wrist joints, MCPs PIPs and DIPs been good range of motion with no synovitis.  Hip joints, knee joints, ankles, MTPs and PIPs been good range of motion with no synovitis.  CDAI Exam: CDAI Score: -- Patient Global: --; Provider Global: -- Swollen: --; Tender: -- Joint Exam 11/15/2022   No joint exam has been documented for this visit   There is currently no information documented on the homunculus. Go to the Rheumatology activity and complete the homunculus joint exam.  Investigation: No additional findings.  Imaging: No results found.  Recent Labs: Lab Results  Component Value Date   WBC 6.8 08/03/2022   HGB 14.0 08/03/2022   PLT 259.0 08/03/2022   NA 143 08/03/2022   K 4.7 08/03/2022   CL 106 08/03/2022   CO2 32 08/03/2022   GLUCOSE 86 08/03/2022   BUN 12 08/03/2022   CREATININE 0.64 08/03/2022   BILITOT 1.4 (H) 08/03/2022   ALKPHOS 98 08/03/2022   AST 28 08/03/2022   ALT 28 08/03/2022   PROT 6.4 08/03/2022   ALBUMIN 4.2 08/03/2022   CALCIUM 10.1 08/03/2022   GFRAA >60 05/15/2020   October 21, 2022 ANA 1: 40NS and cytoplasmic, RNP negative, C3-C4 normal, anticardiolipin negative, beta-2 GP 1 negative, cryoglobulins not detected. 02/25/22: ANCA-, Scl-70-, ESR 6, aldolase 4.8, anti-CCP<16, dDNA-, Anti-Jo1-, Sm-, Ro-, La-, CK 71. Myomarker panel: anti-SS-A 52kD ab 27, rest of panel negative    Speciality Comments: No specialty comments available.  Procedures:  No procedures performed Allergies: Doxycycline, Demerol hcl [meperidine], Fentanyl, Fluocinolone, Penicillins, Breo ellipta [fluticasone furoate-vilanterol], and Imdur [isosorbide nitrate]    Assessment / Plan:     Visit Diagnoses: Raynaud's phenomenon without gangrene - History of Raynaud's for several years without any digital ulcers.  All autoimmune workup negative.  Labs obtained during the last visit including ANA, RNP and complements were discussed with the patient.  Anticardiolipin antibody, beta-2 GP 1 antibody and cryoglobulins were negative.  Other autoimmune antibodies in the past have been negative.  She had SSA 52 Kd antibody in the past which is usually associated with myositis.  She had no clinical features of myositis.  Detailed counsel regarding Raynaud's phenomenon was provided.  A handout on Raynauds was given.  I advised her to contact me if she develops any new symptoms.  Her symptoms are mild and do not require any treatment.  There is no history of digital ulcers.  Keeping core temperature warm and warm clothing was discussed.  Chronic bilateral thoracic back pain - Discomfort in the left infra scapular region.  Thoracic exercises were advised at the last visit.  Patient states she started doing yoga stretches which has been helpful.  DDD (degenerative disc disease), lumbar - Previous x-rays showed multilevel spondylosis spondylolisthesis and facet joint arthropathy.  She has noticed improvement in her lower back pain since she has practicing yoga.  Osteopenia of multiple sites - DEXA scan in October 2022 showed a T-score of -1.2.  Use of calcium rich  diet and vitamin D was discussed.  Strength training was also discussed.  Essential hypertension, benign-blood pressure was 115/79 today.  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  No orders of the defined types were placed in this  encounter.   Follow-Up Instructions: Return if symptoms worsen or fail to improve, for Raynauds.   Bo Merino, MD  Note - This record has been created using Editor, commissioning.  Chart creation errors have been sought, but may not always  have been located. Such creation errors do not reflect on  the standard of medical care.

## 2022-11-04 ENCOUNTER — Encounter: Payer: Self-pay | Admitting: Gastroenterology

## 2022-11-15 ENCOUNTER — Ambulatory Visit: Payer: PPO | Attending: Rheumatology | Admitting: Rheumatology

## 2022-11-15 ENCOUNTER — Encounter: Payer: Self-pay | Admitting: Rheumatology

## 2022-11-15 VITALS — BP 115/79 | HR 78 | Resp 16 | Ht 64.0 in | Wt 132.0 lb

## 2022-11-15 DIAGNOSIS — E782 Mixed hyperlipidemia: Secondary | ICD-10-CM | POA: Diagnosis not present

## 2022-11-15 DIAGNOSIS — M5136 Other intervertebral disc degeneration, lumbar region: Secondary | ICD-10-CM

## 2022-11-15 DIAGNOSIS — M51369 Other intervertebral disc degeneration, lumbar region without mention of lumbar back pain or lower extremity pain: Secondary | ICD-10-CM

## 2022-11-15 DIAGNOSIS — M546 Pain in thoracic spine: Secondary | ICD-10-CM

## 2022-11-15 DIAGNOSIS — M8589 Other specified disorders of bone density and structure, multiple sites: Secondary | ICD-10-CM | POA: Diagnosis not present

## 2022-11-15 DIAGNOSIS — G8929 Other chronic pain: Secondary | ICD-10-CM | POA: Diagnosis not present

## 2022-11-15 DIAGNOSIS — Z87891 Personal history of nicotine dependence: Secondary | ICD-10-CM | POA: Diagnosis not present

## 2022-11-15 DIAGNOSIS — K449 Diaphragmatic hernia without obstruction or gangrene: Secondary | ICD-10-CM | POA: Diagnosis not present

## 2022-11-15 DIAGNOSIS — I10 Essential (primary) hypertension: Secondary | ICD-10-CM | POA: Diagnosis not present

## 2022-11-15 DIAGNOSIS — J438 Other emphysema: Secondary | ICD-10-CM

## 2022-11-15 DIAGNOSIS — I779 Disorder of arteries and arterioles, unspecified: Secondary | ICD-10-CM | POA: Diagnosis not present

## 2022-11-15 DIAGNOSIS — I7300 Raynaud's syndrome without gangrene: Secondary | ICD-10-CM

## 2022-11-15 DIAGNOSIS — I739 Peripheral vascular disease, unspecified: Secondary | ICD-10-CM

## 2022-11-15 NOTE — Patient Instructions (Signed)
Raynaud's Phenomenon  Raynaud's phenomenon is a condition that affects the blood vessels (arteries) that carry blood to the fingers and toes. The arteries that supply blood to the ears, lips, nipples, or the tip of the nose might also be affected. Raynaud's phenomenon causes the arteries to become narrow temporarily (spasm). As a result, the flow of blood to the affected areas is temporarily decreased. This usually occurs in response to cold temperatures or stress. During an attack, the skin in the affected areas turns white, then blue, and finally red. A person may also feel tingling or numbness in those areas. Attacks usually last for only a brief period, and then the blood flow to the area returns to normal. In most cases, Raynaud's phenomenon does not cause serious health problems. What are the causes? In many cases, the cause of this condition is not known. The condition may occur on its own (primary Raynaud's phenomenon) or may be associated with other diseases or factors (secondary Raynaud's phenomenon). Possible causes may include: Diseases or medical conditions that damage the arteries. Injuries and repetitive actions that hurt the hands or feet. Being exposed to certain chemicals. Taking medicines that narrow the arteries. Other medical conditions, such as lupus, scleroderma, rheumatoid arthritis, thyroid problems, blood disorders, Sjogren syndrome, or atherosclerosis. What increases the risk? The following factors may make you more likely to develop this condition: Being 20-40 years old. Being female. Having a family history of Raynaud's phenomenon. Living in a cold climate. Smoking. What are the signs or symptoms? Symptoms of this condition usually occur when you are exposed to cold temperatures or when you have emotional stress. The symptoms may last for a few minutes or up to several hours. They usually affect your fingers but may also affect your toes, nipples, lips, ears, or the  tip of your nose. Symptoms may include: Changes in skin color. The skin in the affected areas will turn pale or white. The skin may then change from white to bluish to red as normal blood flow returns to the area. Numbness, tingling, or pain in the affected areas. In severe cases, symptoms may include: Skin sores. Tissues decaying and dying (gangrene). How is this diagnosed? This condition may be diagnosed based on: Your symptoms and medical history. A physical exam. During the exam, you may be asked to put your hands in cold water to check for a reaction to cold temperature. Tests, such as: Blood tests to check for other diseases or conditions. A test to check the movement of blood through your arteries and veins (vascular ultrasound). A test in which the skin at the base of your fingernail is examined under a microscope (nailfold capillaroscopy). How is this treated? During an episode, you can take actions to help symptoms go away faster. Options include moving your arms around in a windmill pattern, warming your fingers under warm water, or placing your fingers in a warm body fold, such as your armpit. Long-term treatment for this condition often involves making lifestyle changes and taking steps to control your exposure to cold temperature. For more severe cases, medicine (calcium channel blockers) may be used to improve blood circulation. Follow these instructions at home: Avoiding cold temperatures Take these steps to avoid exposure to cold: If possible, stay indoors during cold weather. When you go outside during cold weather, dress in layers and wear mittens, a hat, a scarf, and warm footwear. Wear mittens or gloves when handling ice or frozen food. Use holders for glasses or cans containing   cold drinks. Let warm water run for a while before taking a shower or bath. Warm up the car before driving in cold weather. Lifestyle If possible, avoid stressful and emotional situations. Try  to find ways to manage your stress, such as: Exercise. Yoga. Meditation. Biofeedback. Do not use any products that contain nicotine or tobacco. These products include cigarettes, chewing tobacco, and vaping devices, such as e-cigarettes. If you need help quitting, ask your health care provider. Avoid secondhand smoke. Limit your use of caffeine. Switch to decaffeinated coffee, tea, and soda. Avoid chocolate. Avoid vibrating tools and machinery. General instructions Protect your hands and feet from injuries, cuts, or bruises. Avoid wearing tight rings or wristbands. Wear loose fitting socks and comfortable, roomy shoes. Take over-the-counter and prescription medicines only as told by your health care provider. Where to find support Raynaud's Association: www.raynauds.org Where to find more information National Institute of Arthritis and Musculoskeletal and Skin Diseases: www.niams.nih.gov Contact a health care provider if: Your discomfort becomes worse despite lifestyle changes. You develop sores on your fingers or toes that do not heal. You have breaks in the skin on your fingers or toes. You have a fever. You have pain or swelling in your joints. You have a rash. Your symptoms occur on only one side of your body. Get help right away if: Your fingers or toes turn black. You have severe pain in the affected areas. These symptoms may represent a serious problem that is an emergency. Do not wait to see if the symptoms will go away. Get medical help right away. Call your local emergency services (911 in the [place] Do not drive yourself to the hospital. Summary Raynaud's phenomenon is a condition that affects the arteries that carry blood to the fingers, toes, ears, lips, nipples, or the tip of the nose. In many cases, the cause of this condition is not known. Symptoms of this condition include changes in skin color along with numbness and tingling in the affected area. Treatment for  this condition includes lifestyle changes and reducing exposure to cold temperatures. Medicines may be used for severe cases of the condition. Contact your health care provider if your condition worsens despite treatment. This information is not intended to replace advice given to you by your health care provider. Make sure you discuss any questions you have with your health care provider. Document Revised: 11/17/2020 Document Reviewed: 11/17/2020 Elsevier Patient Education  2023 Elsevier Inc.  

## 2022-11-16 ENCOUNTER — Ambulatory Visit: Payer: PPO | Admitting: Gastroenterology

## 2022-11-29 ENCOUNTER — Encounter: Payer: Self-pay | Admitting: Gastroenterology

## 2022-11-29 ENCOUNTER — Ambulatory Visit (INDEPENDENT_AMBULATORY_CARE_PROVIDER_SITE_OTHER): Payer: PPO | Admitting: Gastroenterology

## 2022-11-29 ENCOUNTER — Telehealth: Payer: Self-pay

## 2022-11-29 VITALS — BP 106/62 | HR 100 | Ht 64.0 in | Wt 132.4 lb

## 2022-11-29 DIAGNOSIS — Z9889 Other specified postprocedural states: Secondary | ICD-10-CM | POA: Diagnosis not present

## 2022-11-29 DIAGNOSIS — K5909 Other constipation: Secondary | ICD-10-CM | POA: Diagnosis not present

## 2022-11-29 DIAGNOSIS — R142 Eructation: Secondary | ICD-10-CM | POA: Diagnosis not present

## 2022-11-29 DIAGNOSIS — Z8719 Personal history of other diseases of the digestive system: Secondary | ICD-10-CM

## 2022-11-29 MED ORDER — PANTOPRAZOLE SODIUM 40 MG PO TBEC: 40.0000 mg | DELAYED_RELEASE_TABLET | Freq: Two times a day (BID) | ORAL | 0 refills | Status: DC

## 2022-11-29 NOTE — Patient Instructions (Addendum)
_______________________________________________________  If your blood pressure at your visit was 140/90 or greater, please contact your primary care physician to follow up on this.  _______________________________________________________  If you are age 72 or older, your body mass index should be between 23-30. Your Body mass index is 22.72 kg/m. If this is out of the aforementioned range listed, please consider follow up with your Primary Care Provider.  If you are age 37 or younger, your body mass index should be between 19-25. Your Body mass index is 22.72 kg/m. If this is out of the aformentioned range listed, please consider follow up with your Primary Care Provider.   ________________________________________________________  The  GI providers would like to encourage you to use [facility] to communicate with providers for non-urgent requests or questions.  Due to long hold times on the telephone, sending your provider a message by [facility] may be a faster and more efficient way to get a response.  Please allow 48 business hours for a response.  Please remember that this is for non-urgent requests.  _______________________________________________________  We have sent the following medications to your pharmacy for you to pick up at your convenience: Protonix Take 2 times a day for 4 weeks and then take 1 tablet daily  Please purchase the following medications over the counter and take as directed: Bean-o  You have been scheduled for an appointment with Dr. Bryan Lemma on 03-16-2023 at 1:20pm . Please arrive 10 minutes early for your appointment.   Low FODMAP Diet: (Fermentable Oligosaccharides, Disaccharides, Monosaccharides, and Polyols) These are short chain carbohydrates and sugar alcohols that are poorly absorbed by the body, resulting in multiple abdominal symptoms, including changes in bowel habits, abdominal pain/discomfort, bloating, abdominal distension, gas, etc.       It was a pleasure to see you today!  Vito Cirigliano, D.O.

## 2022-11-29 NOTE — Progress Notes (Signed)
Chief Complaint:    Increased gas  GI History: TILA KIJEK is a 72 y.o. female with a history of CVA (on Plavix), Right CEA (2007), left carotid stent, right subclavian stent, osteopenia, and longstanding history of GERD complicated by erosive esophagitis.  Please see note dated 02/01/2022 for full reflux history.   GERD history: -Index symptoms: Regurgitation, pyrosis, nocturnal choking/regurgitation, dyspepsia -Medications trialed: Nexium, omeprazole, Carafate prn -Current medications: Protonix 40 mg daily, Pepcid 40 mg qd -Complications: Dysphagia, 5 cm hiatal hernia   GERD evaluation: - CXR (11/2018): Small HH - EGD (02/2019, Dr. Havery Moros): 5 cm HH, Cameron ulceration, fundic gland polyps -Barium esophagram (03/2019): Normal esophagus, moderate sized HH, extensive reflux noted on water siphon test - 01/18/2021: Robotic repair of paraesophageal hernia and Dor fundoplication with mesh by Dr. Kipp Brood  - Esophagram (08/30/2021): Small residual/recurrent hiatal hernia, esophageal stasis with increased tertiary contractions without reflux, stricture, or ulceration - 09/03/2021: Evaluation at Cardiothoracic Surgery for consideration of revision.  Patient elected for medical therapy. -11/02/2021: CT abdomen/pelvis (ER evaluation for LLQ pain): Moderate sized hiatal hernia, ccy, otherwise normal liver, pancreas, spleen, GI tract. - 01/13/2022: EGD with 2 cm x 2.5 cm hiatal hernia, intact Dor fundoplication but located cephalad to diaphragmatic hiatus - 05/12/2022: Hernia redo with gastropexy by Dr. Kipp Brood.    Iron deficiency anemia diagnosed in 07/2019: -Iron 20, transferrin 43, sat 3.5%, ferritin 5.4 (none for comparison) -H/H 11.6/36.5 with MCV/RDW 76.3/17.7 (12.9/39.0 with 86.5/15 in 01/2019 and baseline hemoglobin ~14) -Started on ferrous sulfate bid 07/2019 (some constipation)   Endoscopic history: -Colonoscopy approximate 2008 normal per patient - EGD (02/2019, Dr. Havery Moros): 5 cm HH,  Cameron ulceration, fundic gland polyps - Cologuard negative 05/2017 -EGD (08/2019, Dr. Bryan Lemma): 5 cm HH, no Cameron's ulcer, Hill grade 4, fundic gland polyps, 8 mm antral HP resected, mild non-H. pylori gastritis, normal duodenum with normal biopsies -Colonoscopy (08/2019, Dr. Bryan Lemma): 6 polyps (SSP x2, HP x4), internal hemorrhoids, sigmoid diverticulosis.  Normal TI.  Repeat 5 years - EGD (01/21/2022): Normal Z-line, 2 cm axial x 2.5 cm transverse with hiatal hernia, intact Dor fundoplication but located cephalad to diaphragmatic hiatus.  Otherwise normal stomach and duodenum  HPI:     Patient is a 72 y.o. female presenting to the [facility]ology Clinic for follow-up.  Was last seen in this clinic by Alonza Bogus on 10/18/2022.  Main issue at that time was excessive gas in the form of belching and flatulence and mild nausea.  Symptoms started in 08/2022.  Has trialed multiple agents to include Zofran, Pepcid, Gas-X, then trial of laxatives recommended by her PCM.  Jessica recommended IBgard and FD guard (provided with samples) and discussed low FODMAP diet.  For constipation, recommended MiraLAX 1 cap daily and titrate to effect.  Since then, she has tried Pepto-Bismol and peppermint essential oil daily (IBgard and FD guard were close prohibitive) without much improvement.  Some short-term improvement with Gas-X.  She continues to have belching and the feeling of trapped gas in her esophagus.  Symptoms are worst after eating and at nighttime.  Reports that she can belch when laying on left side with relief, but not when on right.    Worse with fried foods, beans which she has cut out of her diet.  Review of systems:     No chest pain, no SOB, no fevers, no urinary sx   Past Medical History:  Diagnosis Date   Abdominal bruit 04/2017   Aortic u/s showed NO ANEURISM.  Anemia    low iron   Anxiety    Carotid artery occlusion    hx L carotid stent   Cataract    COPD (chronic  obstructive pulmonary disease) ([place])    DOE, worse since 11/2018.  Stress test neg 01/2019.  Albut x 1 inh prn helpful some.  Pulm 06/2019, PFTs not diag of COPD, DOE likely from deconditioning.   Coronary artery disease    Essential hypertension, benign 05/22/2014   GERD (gastroesophageal reflux disease)    + hx of esophagitis   [name] syndrome    H/O hiatal hernia 02/2019   EGD-->moderate size->surgery recommended but pt declined.   Hematochezia 2020/21   internal hemorrhoids   History of iron deficiency anemia 07/2019   Dr. Bryan Lemma EGD and colonoscopy, some anorectal muscosal ulceration, o/w just mild chronic gastritis.   Also could be poor absorption due to chronic high dose acid suppression. Hb/iron normalized aft iron infus and ongoing oral iron.   History of kidney stones    one time   ILD (interstitial lung disease) ([place])    Detected on lung cancer screening CT 01/2022.  ILD blood panel all normal except weakly + myositis ab's.  Pt to be evaluated by Dr. Chase Caller asymptomatic as of 03/2022.   Mitral valve prolapse    Mixed hyperlipidemia 05/22/2014   OAB (overactive bladder)    detrol LA from urol helpful   Osteopenia 06/06/2016   05/2016 DEXA T-score -1.5.  06/2018 DEXA T score -1.1.  T score -1.2 06/2021   Peripheral vascular disease, unspecified ([place]) 05/22/2014   innominate and carotid.  U/S f/u 12/2016 showed no signif change compared to 2017--vascular recommended repeat 1 yr.  Doing well as of 08/2018 cardiol f/u-->continued on ASA and Plavix. F/u 08/2019->ASA d/c'd, plavix continued.   Prediabetes 2018   2018 A1c 6.3%.  05/2018 A1c 6.1%. 07/2019 A1c 6.4%.   Raynaud disease    Seasonal allergies    TIA (transient ischemic attack) 2006    Patient's surgical history, family medical history, social history, medications and allergies were all reviewed in Epic    Current Outpatient Medications  Medication Sig Dispense Refill   acetaminophen (TYLENOL) 325 MG tablet Take  2 tablets (650 mg total) by mouth every 6 (six) hours as needed for mild pain or moderate pain.     albuterol (VENTOLIN HFA) 108 (90 Base) MCG/ACT inhaler Inhale 2 puffs into the lungs every 6 (six) hours as needed for wheezing or shortness of breath. 8 g 6   atorvastatin (LIPITOR) 80 MG tablet TAKE ONE TABLET BY MOUTH DAILY 90 tablet 1   Calcium-Magnesium-Vitamin D (CALCIUM 1200+D3 PO) Take 1 tablet by mouth daily.     clopidogrel (PLAVIX) 75 MG tablet Take 1 tablet (75 mg total) by mouth daily. 90 tablet 1   diazepam (VALIUM) 2 MG tablet TAKE ONE TABLET BY MOUTH TWICE DAILY 60 tablet 5   docusate sodium (COLACE) 100 MG capsule Take 100 mg by mouth daily.     estradiol (ESTRACE) 0.1 MG/GM vaginal cream Place 1 Applicatorful vaginally 2 (two) times a week.     ezetimibe (ZETIA) 10 MG tablet Take 1 tablet (10 mg total) by mouth daily. 90 tablet 1   famotidine (PEPCID) 40 MG tablet TAKE ONE TABLET BY MOUTH DAILY 90 tablet 1   fexofenadine (ALLEGRA) 180 MG tablet Take 180 mg by mouth daily as needed for allergies.     fluticasone (FLONASE) 50 MCG/ACT nasal spray Place 2 sprays into both  nostrils daily. 16 g 6   lisinopril (ZESTRIL) 5 MG tablet Take 1 tablet (5 mg total) by mouth daily. 90 tablet 1   Magnesium 500 MG CAPS Take 500 mg by mouth daily.     nitroGLYCERIN (NITROSTAT) 0.4 MG SL tablet Place 1 tablet (0.4 mg total) under the tongue every 5 (five) minutes as needed for chest pain. 25 tablet 6   ondansetron (ZOFRAN-ODT) 4 MG disintegrating tablet Take 1 tablet (4 mg total) by mouth every 6 (six) hours as needed for nausea or vomiting. 60 tablet 3   Probiotic Product (PROBIOTIC DAILY PO) Take 1 capsule by mouth daily.     Tiotropium Bromide Monohydrate (SPIRIVA RESPIMAT) 1.25 MCG/ACT AERS Inhale 2 puffs into the lungs daily. 4 g 3   tolterodine (DETROL LA) 4 MG 24 hr capsule Take 4 mg by mouth daily.     No current facility-administered medications for this visit.    Physical Exam:      BP 106/62   Pulse 100   Ht '5\' 4"'$  (1.626 m)   Wt 132 lb 6 oz (60 kg)   SpO2 95%   BMI 22.72 kg/m   GENERAL:  Pleasant female in NAD PSYCH: : Cooperative, normal affect NEURO: Alert and oriented x 3, no focal neurologic deficits   IMPRESSION and PLAN:    1) Belching 2) Abdominal bloating 3) History of hiatal hernia repair 4) History of fundoplication Discussed potential DDx for increased belching, feeling of trapped gas, and intermittent bloating.  Predominance of symptoms seems supradiaphragmatic by her description, and unclear if this is from the fundoplication, aerophagia, dysmotility, etc. plan for the following:  - Recommended she follow-up with Dr. Kipp Brood - Will trial short course of Protonix 40 mg bid for diagnostic and therapeutic intent.  If no significant improvement, reduce to 40 mg daily and titrate back off again - Trial course of Beano - Discussed possibly evaluating with either upper GI series or esophagram, but will have her see Dr. Kipp Brood first and getting his input on repeat imaging - Can trial low FODMAP diet - Symptoms do not seem consistent with SIBO - Recommended eating with her nondominant hand, taking care to avoid air ingestion  5) Constipation - Bowel habits improved on current regimen  I spent over 30 minutes of time, including in depth chart review, independent review of results as outlined above, communicating results with the patient directly, face-to-face time with the patient, coordinating care, and ordering studies and medications as appropriate, and documentation.       Lavena Bullion ,DO, FACG 11/29/2022, 1:39 PM

## 2022-11-29 NOTE — Telephone Encounter (Signed)
Patient contacted the office to state that she saw her Gastroenterologist today and is still having trouble with trapped gas in her esophagus and stomach. She said that her GI physician was going to contact Dr. Kipp Brood about a possible swallow evaluation again as she is still having trouble. Will watch for instructions from Dr. Kipp Brood. Also advised patient to give the office a call back if she needed to schedule an appt here in out office. She acknowledged receipt.

## 2022-11-30 ENCOUNTER — Encounter: Payer: Self-pay | Admitting: Pulmonary Disease

## 2022-12-08 ENCOUNTER — Other Ambulatory Visit: Payer: Self-pay | Admitting: Thoracic Surgery (Cardiothoracic Vascular Surgery)

## 2022-12-08 DIAGNOSIS — K449 Diaphragmatic hernia without obstruction or gangrene: Secondary | ICD-10-CM

## 2022-12-09 ENCOUNTER — Ambulatory Visit: Payer: PPO | Admitting: Thoracic Surgery (Cardiothoracic Vascular Surgery)

## 2022-12-09 VITALS — BP 138/83 | HR 82 | Resp 18 | Ht 64.0 in | Wt 132.0 lb

## 2022-12-09 DIAGNOSIS — Z09 Encounter for follow-up examination after completed treatment for conditions other than malignant neoplasm: Secondary | ICD-10-CM

## 2022-12-09 DIAGNOSIS — K449 Diaphragmatic hernia without obstruction or gangrene: Secondary | ICD-10-CM | POA: Diagnosis not present

## 2022-12-09 NOTE — Progress Notes (Signed)
      [address]       [place] [address]             [phone]        [name]  Medical Record [id number] Date of Birth: [birth year]  Referring: Laura Nash, DO Primary Care: Laura Sou, MD Primary Cardiologist:Laura Irish Lack, MD  Reason for visit:   follow-up  History of Present Illness:     [name] presents today to discuss her symptoms of dyspepsia.  This began at the beginning of the year.  She describes it mostly as a gassy sensation with certain meals.  She denies any reflux, dysphagia or odynophagia.  Physical Exam: BP 138/83 (BP Location: Left Arm, Patient Position: Sitting)   Pulse 82   Resp 18   Ht 5\' 4"  (1.626 m)   Wt 132 lb (59.9 kg)   SpO2 97% Comment: RA  BMI 22.66 kg/m   Alert NAD Abdomen, ND No peripheral edema   Assessment / Plan:   72 year old female status post redo hiatal hernia repair.  I will order a gastric emptying study as well as an esophagram for further assessment.  She will follow-up after those procedures have been performed.   [name] 12/09/2022 10:44 AM

## 2022-12-21 DIAGNOSIS — H43813 Vitreous degeneration, bilateral: Secondary | ICD-10-CM | POA: Diagnosis not present

## 2022-12-21 DIAGNOSIS — H18513 Endothelial corneal dystrophy, bilateral: Secondary | ICD-10-CM | POA: Diagnosis not present

## 2022-12-21 DIAGNOSIS — H40013 Open angle with borderline findings, low risk, bilateral: Secondary | ICD-10-CM | POA: Diagnosis not present

## 2022-12-21 DIAGNOSIS — Z961 Presence of intraocular lens: Secondary | ICD-10-CM | POA: Diagnosis not present

## 2022-12-22 ENCOUNTER — Ambulatory Visit (HOSPITAL_COMMUNITY)
Admission: RE | Admit: 2022-12-22 | Discharge: 2022-12-22 | Disposition: A | Discharge status: Home / Self Care | Payer: PPO | Source: Ambulatory Visit | Attending: Thoracic Surgery (Cardiothoracic Vascular Surgery) | Admitting: Thoracic Surgery (Cardiothoracic Vascular Surgery)

## 2022-12-22 ENCOUNTER — Other Ambulatory Visit: Payer: Self-pay | Admitting: Acute Care

## 2022-12-22 ENCOUNTER — Ambulatory Visit (HOSPITAL_COMMUNITY): Payer: PPO

## 2022-12-22 DIAGNOSIS — Z09 Encounter for follow-up examination after completed treatment for conditions other than malignant neoplasm: Secondary | ICD-10-CM | POA: Diagnosis not present

## 2022-12-22 DIAGNOSIS — Z87891 Personal history of nicotine dependence: Secondary | ICD-10-CM

## 2022-12-22 DIAGNOSIS — K219 Gastro-esophageal reflux disease without esophagitis: Secondary | ICD-10-CM | POA: Diagnosis not present

## 2022-12-22 DIAGNOSIS — Z122 Encounter for screening for malignant neoplasm of respiratory organs: Secondary | ICD-10-CM

## 2022-12-22 NOTE — Telephone Encounter (Signed)
Can you all change the location to [facility] for this patient?  She wants everything done in [place] and not in [place] and her CT scan was put in for AP.  Thank you.

## 2022-12-26 DIAGNOSIS — K3184 Gastroparesis: Secondary | ICD-10-CM

## 2022-12-26 HISTORY — DX: Gastroparesis: K31.84

## 2022-12-27 DIAGNOSIS — N3281 Overactive bladder: Secondary | ICD-10-CM | POA: Diagnosis not present

## 2022-12-27 DIAGNOSIS — R3915 Urgency of urination: Secondary | ICD-10-CM | POA: Diagnosis not present

## 2023-01-06 ENCOUNTER — Encounter (HOSPITAL_COMMUNITY)
Admission: RE | Admit: 2023-01-06 | Discharge: 2023-01-06 | Disposition: A | Discharge status: Home / Self Care | Payer: PPO | Source: Ambulatory Visit | Attending: Thoracic Surgery (Cardiothoracic Vascular Surgery) | Admitting: Thoracic Surgery (Cardiothoracic Vascular Surgery)

## 2023-01-06 DIAGNOSIS — Z0389 Encounter for observation for other suspected diseases and conditions ruled out: Secondary | ICD-10-CM | POA: Diagnosis not present

## 2023-01-06 DIAGNOSIS — Z09 Encounter for follow-up examination after completed treatment for conditions other than malignant neoplasm: Secondary | ICD-10-CM | POA: Insufficient documentation

## 2023-01-06 MED ORDER — TECHNETIUM TC 99M SULFUR COLLOID
2.1000 | Freq: Once | INTRAVENOUS | Status: AC | PRN
  Administered 2023-01-06: 2.1 via ORAL

## 2023-01-11 ENCOUNTER — Other Ambulatory Visit: Payer: Self-pay | Admitting: Family Medicine

## 2023-01-11 NOTE — Telephone Encounter (Signed)
RF request for zetia LOV: 10/12/22 Next ov: 02/01/23 Last written: 08/03/22 (90,1)  Requesting: diazepam Contract: 08/03/22 UDS: 08/03/22 Last Visit: 10/12/22 Next Visit: 02/01/23 Last Refill: 07/13/22 (60,5)  RF request for atorvastatin LOV: 10/12/22 Next ov: 02/01/23 Last written: 08/08/22 (90,1)   Please Advise. Meds pending

## 2023-01-20 ENCOUNTER — Ambulatory Visit (INDEPENDENT_AMBULATORY_CARE_PROVIDER_SITE_OTHER): Payer: PPO | Admitting: Thoracic Surgery (Cardiothoracic Vascular Surgery)

## 2023-01-20 DIAGNOSIS — K449 Diaphragmatic hernia without obstruction or gangrene: Secondary | ICD-10-CM

## 2023-01-20 MED ORDER — METOCLOPRAMIDE HCL 5 MG PO TABS: 5.0000 mg | ORAL_TABLET | Freq: Three times a day (TID) | ORAL | 0 refills | Status: DC

## 2023-01-20 NOTE — Progress Notes (Signed)
     [address]       [name] [address]             [phone]       Patient: Home Provider: Office Consent for Telemedicine visit obtained.  Today's visit was completed via a real-time telehealth (see specific modality noted below). The patient/authorized person provided oral consent at the time of the visit to engage in a telemedicine encounter with the present provider at [facility]. The patient/authorized person was informed of the potential benefits, limitations, and risks of telemedicine. The patient/authorized person expressed understanding that the laws that protect confidentiality also apply to telemedicine. The patient/authorized person acknowledged understanding that telemedicine does not provide emergency services and that he or she would need to call 911 or proceed to the nearest hospital for help if such a need arose.   Total time spent in the clinical discussion 10 minutes.  Telehealth Modality: Phone visit (audio only)  I had a telephone visit with [name].  She continues to have some dyspepsia when eating.  I reviewed her gastric emptying study which is consistent with delayed gastric emptying.  I have made a referral to Dr.Nandigam, and given her a prescription for Reglan.  I will follow-up with her in 1 month.

## 2023-01-27 ENCOUNTER — Telehealth: Payer: Self-pay | Admitting: Gastroenterology

## 2023-01-27 NOTE — Telephone Encounter (Signed)
PT was referred from Dr. Cliffton Asters to see Dr. Lavon Paganini for S/P repair of paraesophageal hernia. She was told that Dr. Barron Alvine could not perform this procedure And wants to know what she should do from this point because she doesn't want to lose care transferring to Dr. Lavon Paganini and then she not approve her.Please advise.

## 2023-01-30 NOTE — Patient Instructions (Signed)

## 2023-02-01 ENCOUNTER — Ambulatory Visit (INDEPENDENT_AMBULATORY_CARE_PROVIDER_SITE_OTHER): Payer: PPO | Admitting: Family Medicine

## 2023-02-01 ENCOUNTER — Encounter: Payer: Self-pay | Admitting: Family Medicine

## 2023-02-01 VITALS — BP 132/82 | HR 70 | Temp 97.8°F | Ht 64.0 in | Wt 128.8 lb

## 2023-02-01 DIAGNOSIS — F411 Generalized anxiety disorder: Secondary | ICD-10-CM

## 2023-02-01 DIAGNOSIS — E7800 Pure hypercholesterolemia, unspecified: Secondary | ICD-10-CM | POA: Diagnosis not present

## 2023-02-01 DIAGNOSIS — R7303 Prediabetes: Secondary | ICD-10-CM | POA: Diagnosis not present

## 2023-02-01 DIAGNOSIS — I10 Essential (primary) hypertension: Secondary | ICD-10-CM | POA: Diagnosis not present

## 2023-02-01 LAB — LIPID PANEL
Cholesterol: 102 mg/dL (ref 0–200)
HDL: 45.8 mg/dL (ref >39.00)
LDL Cholesterol: 38 mg/dL (ref 0–99)
NonHDL: 56.13
Total CHOL/HDL Ratio: 2
Triglycerides: 92 mg/dL (ref 0.0–149.0)
VLDL: 18.4 mg/dL (ref 0.0–40.0)

## 2023-02-01 LAB — COMPREHENSIVE METABOLIC PANEL
ALT: 68 U/L — ABNORMAL HIGH (ref 0–35)
AST: 46 U/L — ABNORMAL HIGH (ref 0–37)
Albumin: 4.2 g/dL (ref 3.5–5.2)
Alkaline Phosphatase: 101 U/L (ref 39–117)
BUN: 8 mg/dL (ref 6–23)
CO2: 28 mEq/L (ref 19–32)
Calcium: 10.1 mg/dL (ref 8.4–10.5)
Chloride: 106 mEq/L (ref 96–112)
Creatinine, Ser: 0.76 mg/dL (ref 0.40–1.20)
GFR: 78.66 mL/min (ref >60.00)
Glucose, Bld: 102 mg/dL — ABNORMAL HIGH (ref 70–99)
Potassium: 5.2 mEq/L — ABNORMAL HIGH (ref 3.5–5.1)
Sodium: 142 mEq/L (ref 135–145)
Total Bilirubin: 1.8 mg/dL — ABNORMAL HIGH (ref 0.2–1.2)
Total Protein: 6.5 g/dL (ref 6.0–8.3)

## 2023-02-01 LAB — POCT GLYCOSYLATED HEMOGLOBIN (HGB A1C)
HbA1c POC (<> result, manual entry): 5.4 % (ref 4.0–5.6)
HbA1c, POC (controlled diabetic range): 5.4 % (ref 0.0–7.0)
HbA1c, POC (prediabetic range): 5.4 % — AB (ref 5.7–6.4)
Hemoglobin A1C: 5.4 % (ref 4.0–5.6)

## 2023-02-01 MED ORDER — LISINOPRIL 5 MG PO TABS: 5.0000 mg | ORAL_TABLET | Freq: Every day | ORAL | 1 refills | Status: DC

## 2023-02-01 MED ORDER — CLOPIDOGREL BISULFATE 75 MG PO TABS: 75.0000 mg | ORAL_TABLET | Freq: Every day | ORAL | 1 refills | Status: DC

## 2023-02-01 MED ORDER — FAMOTIDINE 40 MG PO TABS: 40.0000 mg | ORAL_TABLET | Freq: Every day | ORAL | 1 refills | Status: DC

## 2023-02-01 MED ORDER — DIAZEPAM 5 MG PO TABS: ORAL_TABLET | ORAL | 5 refills | Status: DC

## 2023-02-01 NOTE — Progress Notes (Signed)
OFFICE VISIT  02/01/2023  CC:  Chief Complaint  Patient presents with   Medical Management of Chronic Issues    Pt is fasting    Patient is a 72 y.o. female who presents for 3-month follow-up hypertension, hyperlipidemia, and anxiety. A/P as of last visit: "1 hypertension, well controlled on lisinopril 5 mg a day. Electrolytes and creatinine today.   2.  Hypercholesterolemia.  Doing well on atorvastatin 80 mg a day and Zetia 10 mg a day.   #3 GAD with anxiety-related insomnia.  Doing well long-term on Valium 2 mg twice daily. Controlled substance contract updated. Urine drug screen today.   #4 bilateral right toe pain.  She saw podiatrist who prescribed gabapentin but she wanted to wait to talk with me before starting it.  I think this is okay.  She does not know her dose so she will call back with this information and I will give further instructions."  INTERIM HX: Overall [name] is doing okay.  She is struggling with upper gastrointestinal discomfort for the last 5 months or so.  Recent testing showed gastroparesis and she was prescribed Reglan about 10 days ago by her surgeon.  She notes that this has helped her bowel movements become more normal/regular but has not helped her upper GI symptoms any.  She takes Pepto-Bismol, Beano, and Protonix twice a day.  Home blood pressure monitors are typically 90s over 60s per her report today.  She denies generalized weakness, dizziness, or chest pain.  PMP AWARE reviewed today: most recent rx for diazepam was filled 01/11/2023, # 60, rx by me. No red flags.  ROS as above, plus--> no fevers, no CP, no SOB, no wheezing, no cough, no dizziness, no HAs, no rashes, no melena/hematochezia.  No polyuria or polydipsia.  No myalgias or arthralgias.  No focal weakness, paresthesias, or tremors.  No acute vision or hearing abnormalities.  No dysuria or unusual/new urinary urgency or frequency.  No recent changes in lower legs. No palpitations.    Past  Medical History:  Diagnosis Date   Abdominal bruit 04/2017   Aortic u/s showed NO ANEURISM.   Anemia    low iron   Anxiety    Carotid artery occlusion    hx L carotid stent   Cataract    COPD (chronic obstructive pulmonary disease) (HCC)    DOE, worse since 11/2018.  Stress test neg 01/2019.  Albut x 1 inh prn helpful some.  Pulm 06/2019, PFTs not diag of COPD, DOE likely from deconditioning.   Coronary artery disease    Essential hypertension, benign 05/22/2014   Gastroparesis 12/2022   delayed emptying on scan->reglan (Dr. Cliffton Asters)   GERD (gastroesophageal reflux disease)    + hx of esophagitis   Sullivan Lone syndrome    H/O hiatal hernia 02/2019   EGD-->moderate size->surgery recommended but pt declined.   Hematochezia 2020/21   internal hemorrhoids   History of iron deficiency anemia 07/2019   Dr. Barron Alvine EGD and colonoscopy, some anorectal muscosal ulceration, o/w just mild chronic gastritis.   Also could be poor absorption due to chronic high dose acid suppression. Hb/iron normalized aft iron infus and ongoing oral iron.   History of kidney stones    one time   ILD (interstitial lung disease) (HCC)    Detected on lung cancer screening CT 01/2022.  ILD blood panel all normal except weakly + myositis ab's.  Pt to be evaluated by Dr. Marchelle Gearing asymptomatic as of 03/2022.   Mitral valve prolapse  Mixed hyperlipidemia 05/22/2014   OAB (overactive bladder)    detrol LA from urol helpful   Osteopenia 06/06/2016   05/2016 DEXA T-score -1.5.  06/2018 DEXA T score -1.1.  T score -1.2 06/2021   Peripheral vascular disease, unspecified (HCC) 05/22/2014   innominate and carotid.  U/S f/u 12/2016 showed no signif change compared to 2017--vascular recommended repeat 1 yr.  Doing well as of 08/2018 cardiol f/u-->continued on ASA and Plavix. F/u 08/2019->ASA d/c'd, plavix continued.   Prediabetes 2018   2018 A1c 6.3%.  05/2018 A1c 6.1%. 07/2019 A1c 6.4%.   Raynaud disease    Seasonal  allergies    TIA (transient ischemic attack) 2006    Past Surgical History:  Procedure Laterality Date   ABDOMINAL HYSTERECTOMY  1997   CARDIAC CATHETERIZATION  01/2012   NORMAL   CARDIOVASCULAR STRESS TEST  02/05/2019   NORMAL.  EF normal.   CAROTID ENDARTERECTOMY  08/21/2007   right - Dr. Madilyn Fireman   CHOLECYSTECTOMY  2000   COLONOSCOPY  approx 2008; 09/23/19   2008 (Dr. Bunnie Pion pt->normal.  08/2019 adenoma, some friable areas of colon + anorectal mucosa inflam w/ ulceration, +diverticulosis. Recall 5 yrs   DEXA  06/06/2016   05/2016 T-score -1.5.  06/2018 T score -1.1.  T score -1.2 06/2021.   ESOPHAGOGASTRODUODENOSCOPY  03/11/2019; 09/23/19   02/2019; 5 cm hiatal hernia; GI suggested surgical repair due to intractable GER.  08/2019 mild chronic gastritis, h pylori neg, multiple benign bx's including duodenum neg for celiac changes.   ESOPHAGOGASTRODUODENOSCOPY N/A 01/18/2021   Procedure: ESOPHAGOGASTRODUODENOSCOPY (EGD);  Surgeon: Corliss Skains, MD;  Location: [facility] OR;  Service: Thoracic;  Laterality: N/A;   ESOPHAGOGASTRODUODENOSCOPY N/A 05/12/2022   Procedure: ESOPHAGOGASTRODUODENOSCOPY (EGD);  Surgeon: Corliss Skains, MD;  Location: [facility] OR;  Service: Thoracic;  Laterality: N/A;   Incision and drainage of left thenar space abscess  02/23/2015   s/p cat bite (Dr. Amanda Pea)   innominate stent  2007   12/2014 u/s showed patent stent in innominate and L common carotid.  L prox int carotid 40-59% stenosis   LDCT for lung ca screening  06/2019   NEG->rpt 1 yr   Left common carotid stent  2006 and 2007   "                    "                  "                          "                             "   PEXY N/A 05/12/2022   Procedure: GASTROPEXY;  Surgeon: Corliss Skains, MD;  Location: [facility] OR;  Service: Thoracic;  Laterality: N/A;   TRANSTHORACIC ECHOCARDIOGRAM  2007   NORMAL   TUBAL LIGATION     US CAROTID DOPPLER BILATERAL ([facility] HX)  06/2019   left stent w/out  stenosis.  R ICA sten 40-50% obst-stable.  07/20/21 R clear, L 40-59%, normal vert and subclav flow.  07/2022 stable   XI ROBOTIC ASSISTED HIATAL HERNIA REPAIR N/A 05/12/2022   Procedure: XI ROBOTIC ASSISTED HIATAL HERNIA REPAIR;  Surgeon: Corliss Skains, MD;  Location: [facility] OR;  Service: Thoracic;  Laterality: N/A;   XI ROBOTIC ASSISTED PARAESOPHAGEAL HERNIA REPAIR N/A 01/18/2021  Procedure: XI ROBOTIC ASSISTED PARAESOPHAGEAL HERNIA REPAIR WITH FUNDOPLICATION WITH MESH;  Surgeon: Corliss Skains, MD;  Location: [facility] OR;  Service: Thoracic;  Laterality: N/A;    Outpatient Medications Prior to Visit  Medication Sig Dispense Refill   acetaminophen (TYLENOL) 325 MG tablet Take 2 tablets (650 mg total) by mouth every 6 (six) hours as needed for mild pain or moderate pain.     atorvastatin (LIPITOR) 80 MG tablet TAKE ONE TABLET BY MOUTH DAILY 90 tablet 1   Calcium-Magnesium-Vitamin D (CALCIUM 1200+D3 PO) Take 1 tablet by mouth daily.     docusate sodium (COLACE) 100 MG capsule Take 100 mg by mouth daily.     estradiol (ESTRACE) 0.1 MG/GM vaginal cream Place 1 Applicatorful vaginally 2 (two) times a week.     ezetimibe (ZETIA) 10 MG tablet Take 1 tablet (10 mg total) by mouth daily. 90 tablet 1   fexofenadine (ALLEGRA) 180 MG tablet Take 180 mg by mouth daily as needed for allergies.     fluticasone (FLONASE) 50 MCG/ACT nasal spray Place 2 sprays into both nostrils daily. 16 g 6   Magnesium 500 MG CAPS Take 500 mg by mouth daily.     metoCLOPramide (REGLAN) 5 MG tablet Take 1 tablet (5 mg total) by mouth 3 (three) times daily before meals. 120 tablet 0   pantoprazole (PROTONIX) 40 MG tablet Take 1 tablet (40 mg total) by mouth 2 (two) times daily. Take 2 times a day for 4 weeks and then take 1 tablet daily 180 tablet 0   Probiotic Product (PROBIOTIC DAILY PO) Take 1 capsule by mouth daily.     tolterodine (DETROL LA) 4 MG 24 hr capsule Take 4 mg by mouth daily.     diazepam (VALIUM) 2 MG  tablet TAKE ONE TABLET BY MOUTH TWICE DAILY 60 tablet 0   albuterol (VENTOLIN HFA) 108 (90 Base) MCG/ACT inhaler Inhale 2 puffs into the lungs every 6 (six) hours as needed for wheezing or shortness of breath. (Patient not taking: Reported on 02/01/2023) 8 g 6   nitroGLYCERIN (NITROSTAT) 0.4 MG SL tablet Place 1 tablet (0.4 mg total) under the tongue every 5 (five) minutes as needed for chest pain. (Patient not taking: Reported on 02/01/2023) 25 tablet 6   ondansetron (ZOFRAN-ODT) 4 MG disintegrating tablet Take 1 tablet (4 mg total) by mouth every 6 (six) hours as needed for nausea or vomiting. (Patient not taking: Reported on 02/01/2023) 60 tablet 3   Tiotropium Bromide Monohydrate (SPIRIVA RESPIMAT) 1.25 MCG/ACT AERS Inhale 2 puffs into the lungs daily. (Patient not taking: Reported on 02/01/2023) 4 g 3   clopidogrel (PLAVIX) 75 MG tablet Take 1 tablet (75 mg total) by mouth daily. 90 tablet 1   famotidine (PEPCID) 40 MG tablet TAKE ONE TABLET BY MOUTH DAILY 90 tablet 1   lisinopril (ZESTRIL) 5 MG tablet Take 1 tablet (5 mg total) by mouth daily. 90 tablet 1   No facility-administered medications prior to visit.    Allergies  Allergen Reactions   Doxycycline Shortness Of Breath and Swelling    Lips swelling and short of breath   Demerol Hcl [Meperidine] Nausea And Vomiting   Fentanyl Nausea And Vomiting    Can tolerate with nausea meds  Pt states all pain medications cause N/V for her    Fluocinolone Nausea And Vomiting   Penicillins Hives   Breo Ellipta [Fluticasone Furoate-Vilanterol] Other (See Comments)    Lower extremity weakness, lightheadedness    Imdur [Isosorbide Nitrate]  Nausea And Vomiting    Review of Systems As per HPI  PE:    02/01/2023    7:53 AM 12/09/2022   10:08 AM 11/29/2022    1:28 PM  Vitals with BMI  Height 5\' 4"  5\' 4"  5\' 4"   Weight 128 lbs 13 oz 132 lbs 132 lbs 6 oz  BMI 22.1 22.65 22.71  Systolic 132 138 782  Diastolic 82 83 62  Pulse 70 82 100      Physical Exam  Gen: Alert, well appearing.  Patient is oriented to person, place, time, and situation. AFFECT: pleasant, lucid thought and speech. CV: RRR, no m/r/g.   LUNGS: CTA bilat, nonlabored resps, good aeration in all lung fields. EXT: no clubbing or cyanosis.  no edema.    LABS:  Last CBC Lab Results  Component Value Date   WBC 6.8 08/03/2022   HGB 14.0 08/03/2022   HCT 42.1 08/03/2022   MCV 93.7 08/03/2022   MCH 32.4 05/13/2022   RDW 13.5 08/03/2022   PLT 259.0 08/03/2022   Last metabolic panel Lab Results  Component Value Date   GLUCOSE 86 08/03/2022   NA 143 08/03/2022   K 4.7 08/03/2022   CL 106 08/03/2022   CO2 32 08/03/2022   BUN 12 08/03/2022   CREATININE 0.64 08/03/2022   GFRNONAA >60 05/13/2022   CALCIUM 10.1 08/03/2022   PROT 6.4 08/03/2022   ALBUMIN 4.2 08/03/2022   BILITOT 1.4 (H) 08/03/2022   ALKPHOS 98 08/03/2022   AST 28 08/03/2022   ALT 28 08/03/2022   ANIONGAP 5 05/13/2022   Last lipids Lab Results  Component Value Date   CHOL 101 08/03/2022   HDL 42.70 08/03/2022   LDLCALC 37 08/03/2022   LDLDIRECT 134.0 01/28/2021   TRIG 107.0 08/03/2022   CHOLHDL 2 08/03/2022   Last hemoglobin A1c Lab Results  Component Value Date   HGBA1C 5.4 02/01/2023   HGBA1C 5.4 02/01/2023   HGBA1C 5.4 (A) 02/01/2023   HGBA1C 5.4 02/01/2023   Last thyroid functions Lab Results  Component Value Date   TSH 0.89 08/03/2021   Last vitamin D Lab Results  Component Value Date   VD25OH 37.87 08/13/2019   Last vitamin B12 and Folate Lab Results  Component Value Date   VITAMINB12 727 04/26/2022   FOLATE 10.4 04/26/2022   IMPRESSION AND PLAN:  #1 hypertension, well controlled on lisinopril 5 mg a day. Electrolytes and creatinine today.   2.  Hypercholesterolemia.  Doing well on atorvastatin 80 mg a day and Zetia 10 mg a day. Lipid panel today.  #3 GAD with anxiety-related insomnia.  She had been doing well long-term on Valium 2 mg twice  daily but over the last 6 months has noted she has needed this medication more regularly. We will increase to Valium 5 mg twice daily as needed, #60, refill x 5. Controlled substance contract up-to-date.  4 prediabetes. Excellent improvement in A1c over the last 6 months. Point-of-care hemoglobin A1c today is 5.4%.  #5 gastroparesis. Just started Reglan--no improvement in upper GI symptoms yet. She has been referred to Dr. Lavon Paganini, GI. Arnie will call if she needs any assistance.  An After Visit Summary was printed and given to the patient.  FOLLOW UP: Return in about 6 months (around 08/04/2023) for annual CPE (fasting). Next cpe 07/2023  Signed:  Santiago Bumpers, MD           02/01/2023

## 2023-02-01 NOTE — Telephone Encounter (Signed)
Returned call to patient. We reviewed Dr. Frankey Shown recommendations. Pt states that she would like to keep f/u with Dr. Barron Alvine as scheduled in June to discuss everything further. Pt had no concerns at the end of the call.

## 2023-02-02 ENCOUNTER — Telehealth: Payer: Self-pay

## 2023-02-02 DIAGNOSIS — R7401 Elevation of levels of liver transaminase levels: Secondary | ICD-10-CM

## 2023-02-02 NOTE — Telephone Encounter (Signed)
-----   Message from Jeoffrey Massed, MD sent at 02/02/2023  8:07 AM EDT ----- I have sent [name] a MyChart note about results. I want her to get a right upper quadrant Limited abdominal ultrasound (LIVER) at the imaging location of her choice. Diagnosis elevated transaminase--> please enter order.  Thanks.

## 2023-02-06 ENCOUNTER — Encounter: Payer: Self-pay | Admitting: Gastroenterology

## 2023-02-06 NOTE — Telephone Encounter (Signed)
Referral, records, pt's demographics, and insurance information faxed to [facility] (F: [phone])

## 2023-02-10 ENCOUNTER — Ambulatory Visit (HOSPITAL_COMMUNITY)
Admission: RE | Admit: 2023-02-10 | Discharge: 2023-02-10 | Disposition: A | Discharge status: Home / Self Care | Payer: PPO | Source: Ambulatory Visit | Attending: Family Medicine | Admitting: Family Medicine

## 2023-02-10 ENCOUNTER — Encounter: Payer: Self-pay | Admitting: Family Medicine

## 2023-02-10 ENCOUNTER — Other Ambulatory Visit: Payer: Self-pay | Admitting: Family Medicine

## 2023-02-10 ENCOUNTER — Ambulatory Visit (HOSPITAL_COMMUNITY)
Admission: RE | Admit: 2023-02-10 | Discharge: 2023-02-10 | Disposition: A | Discharge status: Home / Self Care | Payer: PPO | Source: Ambulatory Visit | Attending: Acute Care | Admitting: Acute Care

## 2023-02-10 DIAGNOSIS — R7401 Elevation of levels of liver transaminase levels: Secondary | ICD-10-CM

## 2023-02-10 DIAGNOSIS — Z87891 Personal history of nicotine dependence: Secondary | ICD-10-CM | POA: Diagnosis not present

## 2023-02-10 DIAGNOSIS — Z122 Encounter for screening for malignant neoplasm of respiratory organs: Secondary | ICD-10-CM | POA: Diagnosis not present

## 2023-02-10 DIAGNOSIS — R945 Abnormal results of liver function studies: Secondary | ICD-10-CM | POA: Diagnosis not present

## 2023-02-17 ENCOUNTER — Ambulatory Visit (HOSPITAL_COMMUNITY): Payer: PPO

## 2023-02-20 ENCOUNTER — Encounter: Payer: Self-pay | Admitting: Family Medicine

## 2023-02-24 ENCOUNTER — Ambulatory Visit (INDEPENDENT_AMBULATORY_CARE_PROVIDER_SITE_OTHER): Payer: PPO | Admitting: Thoracic Surgery (Cardiothoracic Vascular Surgery)

## 2023-02-24 ENCOUNTER — Encounter: Payer: Self-pay | Admitting: Pulmonary Disease

## 2023-02-24 ENCOUNTER — Encounter: Payer: Self-pay | Admitting: Interventional Cardiology

## 2023-02-24 DIAGNOSIS — K449 Diaphragmatic hernia without obstruction or gangrene: Secondary | ICD-10-CM | POA: Diagnosis not present

## 2023-02-24 MED ORDER — METOCLOPRAMIDE HCL 5 MG PO TABS: 5.0000 mg | ORAL_TABLET | Freq: Three times a day (TID) | ORAL | 0 refills | Status: DC

## 2023-02-24 NOTE — Progress Notes (Signed)
     [address]       [name] [address]             [phone]       Patient: Home Provider: Office Consent for Telemedicine visit obtained.  Today's visit was completed via a real-time telehealth (see specific modality noted below). The patient/authorized person provided oral consent at the time of the visit to engage in a telemedicine encounter with the present provider at [facility]. The patient/authorized person was informed of the potential benefits, limitations, and risks of telemedicine. The patient/authorized person expressed understanding that the laws that protect confidentiality also apply to telemedicine. The patient/authorized person acknowledged understanding that telemedicine does not provide emergency services and that he or she would need to call 911 or proceed to the nearest hospital for help if such a need arose.   Total time spent in the clinical discussion 10 minutes.  Telehealth Modality: Phone visit (audio only)  I had a telephone visit with [name].  She continues have some bloating.  She denies any reflux or dysphagia.  She is scheduled to meet with GI in a month, and is getting a second opinion at [facility].

## 2023-02-24 NOTE — Telephone Encounter (Unsigned)
If it is not happening with walking on the treadmill, I suspect this is not cardiac since that is more prolonged activity.  If it is only happening with bending down, that is more related to just pressure on her diaphragm in that position.  If it gets worse, let us know.  Not sure of a relation with metoclopromide.

## 2023-02-28 NOTE — Telephone Encounter (Signed)
Inbound call from patient wishing to schedule second opinion with Dr. Lavon Paganini. States she never saw [facility] for second opinion and would like to stay within our providers. She is scheduled with Dr. Lavon Paganini for 10/2. She is requesting a call back to discuss appointment further. Please advise, thank you.

## 2023-02-28 NOTE — Telephone Encounter (Signed)
Called and spoke with patient. Pt states that she still plans to come in for appt on 6/20 with Dr. Salena Saner. Patient wanted to know if he could start communicating with Dr. Lavon Paganini about her case after that appt instead of her having to wait until October to be seen. I told pt that I would relay this information and to mention it again when she comes in for her appt. I advised pt that Dr. Lavon Paganini may still want to see her in the office so she should keep October appt as scheduled for now, unless Dr. Salena Saner says otherwise at her appt. Pt verbalized understanding and had no concerns at the end of the call.

## 2023-03-03 ENCOUNTER — Encounter: Payer: Self-pay | Admitting: Family Medicine

## 2023-03-03 ENCOUNTER — Ambulatory Visit (INDEPENDENT_AMBULATORY_CARE_PROVIDER_SITE_OTHER): Payer: PPO | Admitting: Family Medicine

## 2023-03-03 VITALS — BP 95/63 | HR 85 | Ht 64.0 in | Wt 128.2 lb

## 2023-03-03 DIAGNOSIS — M659 Synovitis and tenosynovitis, unspecified: Secondary | ICD-10-CM | POA: Diagnosis not present

## 2023-03-03 DIAGNOSIS — M65341 Trigger finger, right ring finger: Secondary | ICD-10-CM

## 2023-03-03 MED ORDER — TRIAMCINOLONE ACETONIDE 40 MG/ML IJ SUSP
40.0000 mg | Freq: Once | INTRAMUSCULAR | Status: AC
Start: 2023-03-03 — End: 2023-03-03
  Administered 2023-03-03: 20 mg via INTRA_ARTICULAR

## 2023-03-03 NOTE — Progress Notes (Signed)
OFFICE VISIT  03/03/2023  CC:  Chief Complaint  Patient presents with   Finger Pain    Over 1 month, Trigger finger, right hand; knuckle area is painful. Has not taken anything otc or tried any at home remedies.     Patient is a 72 y.o. female who presents for finger pain.  HPI: About 1 month of pain over the volar aspect of right hand fourth finger at the level of the MCP joint.  It has been triggering lately.  Hurts to touch the area.  No history of hand arthritis.  No swelling of the joints in the hands. She has otherwise felt well. No history of trigger finger or flexor tenosynovitis.  Past Medical History:  Diagnosis Date   Abdominal bruit 04/2017   Aortic u/s showed NO ANEURISM.   Anemia    low iron   Anxiety    Carotid artery occlusion    hx L carotid stent   Cataract    COPD (chronic obstructive pulmonary disease) (HCC)    DOE, worse since 11/2018.  Stress test neg 01/2019.  Albut x 1 inh prn helpful some.  Pulm 06/2019, PFTs not diag of COPD, DOE likely from deconditioning.   Coronary artery disease    Elevated transaminase level    Very mild, 2024.  Liver normal on ultrasound.   Essential hypertension, benign 05/22/2014   Gastroparesis 12/2022   delayed emptying on scan->reglan (Dr. Cliffton Asters)   GERD (gastroesophageal reflux disease)    + hx of esophagitis   Sullivan Lone syndrome    H/O hiatal hernia 02/2019   EGD-->moderate size->surgery recommended but pt declined.   Hematochezia 2020/21   internal hemorrhoids   History of iron deficiency anemia 07/2019   Dr. Barron Alvine EGD and colonoscopy, some anorectal muscosal ulceration, o/w just mild chronic gastritis.   Also could be poor absorption due to chronic high dose acid suppression. Hb/iron normalized aft iron infus and ongoing oral iron.   History of kidney stones    one time   ILD (interstitial lung disease) (HCC)    Detected on lung cancer screening CT 01/2022.  ILD blood panel all normal except weakly +  myositis ab's.  Pt to be evaluated by Dr. Marchelle Gearing asymptomatic as of 03/2022.   Mitral valve prolapse    Mixed hyperlipidemia 05/22/2014   OAB (overactive bladder)    detrol LA from urol helpful   Osteopenia 06/06/2016   05/2016 DEXA T-score -1.5.  06/2018 DEXA T score -1.1.  T score -1.2 06/2021   Peripheral vascular disease, unspecified (HCC) 05/22/2014   innominate and carotid.  U/S f/u 12/2016 showed no signif change compared to 2017--vascular recommended repeat 1 yr.  Doing well as of 08/2018 cardiol f/u-->continued on ASA and Plavix. F/u 08/2019->ASA d/c'd, plavix continued.   Prediabetes 2018   2018 A1c 6.3%.  05/2018 A1c 6.1%. 07/2019 A1c 6.4%.   Raynaud disease    Seasonal allergies    TIA (transient ischemic attack) 2006    Past Surgical History:  Procedure Laterality Date   ABDOMINAL HYSTERECTOMY  1997   CARDIAC CATHETERIZATION  01/2012   NORMAL   CARDIOVASCULAR STRESS TEST  02/05/2019   NORMAL.  EF normal.   CAROTID ENDARTERECTOMY  08/21/2007   right - Dr. Madilyn Fireman   CHOLECYSTECTOMY  2000   COLONOSCOPY  approx 2008; 09/23/19   2008 (Dr. Bunnie Pion pt->normal.  08/2019 adenoma, some friable areas of colon + anorectal mucosa inflam w/ ulceration, +diverticulosis. Recall 5 yrs   DEXA  06/06/2016  05/2016 T-score -1.5.  06/2018 T score -1.1.  T score -1.2 06/2021.   ESOPHAGOGASTRODUODENOSCOPY  03/11/2019; 09/23/19   02/2019; 5 cm hiatal hernia; GI suggested surgical repair due to intractable GER.  08/2019 mild chronic gastritis, h pylori neg, multiple benign bx's including duodenum neg for celiac changes.   ESOPHAGOGASTRODUODENOSCOPY N/A 01/18/2021   Procedure: ESOPHAGOGASTRODUODENOSCOPY (EGD);  Surgeon: Corliss Skains, MD;  Location: [facility] OR;  Service: Thoracic;  Laterality: N/A;   ESOPHAGOGASTRODUODENOSCOPY N/A 05/12/2022   Procedure: ESOPHAGOGASTRODUODENOSCOPY (EGD);  Surgeon: Corliss Skains, MD;  Location: [facility] OR;  Service: Thoracic;  Laterality: N/A;   Incision and  drainage of left thenar space abscess  02/23/2015   s/p cat bite (Dr. Amanda Pea)   innominate stent  2007   12/2014 u/s showed patent stent in innominate and L common carotid.  L prox int carotid 40-59% stenosis   LDCT for lung ca screening  06/2019   NEG->rpt 1 yr   Left common carotid stent  2006 and 2007   "                    "                  "                          "                             "   PEXY N/A 05/12/2022   Procedure: GASTROPEXY;  Surgeon: Corliss Skains, MD;  Location: [facility] OR;  Service: Thoracic;  Laterality: N/A;   TRANSTHORACIC ECHOCARDIOGRAM  2007   NORMAL   TUBAL LIGATION     US CAROTID DOPPLER BILATERAL ([facility] HX)  06/2019   left stent w/out stenosis.  R ICA sten 40-50% obst-stable.  07/20/21 R clear, L 40-59%, normal vert and subclav flow.  07/2022 stable   XI ROBOTIC ASSISTED HIATAL HERNIA REPAIR N/A 05/12/2022   Procedure: XI ROBOTIC ASSISTED HIATAL HERNIA REPAIR;  Surgeon: Corliss Skains, MD;  Location: [facility] OR;  Service: Thoracic;  Laterality: N/A;   XI ROBOTIC ASSISTED PARAESOPHAGEAL HERNIA REPAIR N/A 01/18/2021   Procedure: XI ROBOTIC ASSISTED PARAESOPHAGEAL HERNIA REPAIR WITH FUNDOPLICATION WITH MESH;  Surgeon: Corliss Skains, MD;  Location: [facility] OR;  Service: Thoracic;  Laterality: N/A;    Outpatient Medications Prior to Visit  Medication Sig Dispense Refill   acetaminophen (TYLENOL) 325 MG tablet Take 2 tablets (650 mg total) by mouth every 6 (six) hours as needed for mild pain or moderate pain.     albuterol (VENTOLIN HFA) 108 (90 Base) MCG/ACT inhaler Inhale 2 puffs into the lungs every 6 (six) hours as needed for wheezing or shortness of breath. 8 g 6   atorvastatin (LIPITOR) 80 MG tablet TAKE ONE TABLET BY MOUTH DAILY 90 tablet 1   Calcium-Magnesium-Vitamin D (CALCIUM 1200+D3 PO) Take 1 tablet by mouth daily.     clopidogrel (PLAVIX) 75 MG tablet Take 1 tablet (75 mg total) by mouth daily. 90 tablet 1   diazepam (VALIUM) 5 MG tablet 1 tab  po bid as needed for anxiety 60 tablet 5   docusate sodium (COLACE) 100 MG capsule Take 100 mg by mouth daily.     estradiol (ESTRACE) 0.1 MG/GM vaginal cream Place 1 Applicatorful vaginally 2 (two) times a week.     ezetimibe (ZETIA)  10 MG tablet Take 1 tablet (10 mg total) by mouth daily. 90 tablet 1   famotidine (PEPCID) 40 MG tablet Take 1 tablet (40 mg total) by mouth daily. 90 tablet 1   fexofenadine (ALLEGRA) 180 MG tablet Take 180 mg by mouth daily as needed for allergies.     fluticasone (FLONASE) 50 MCG/ACT nasal spray Place 2 sprays into both nostrils daily. 16 g 6   lisinopril (ZESTRIL) 5 MG tablet Take 1 tablet (5 mg total) by mouth daily. 90 tablet 1   Magnesium 500 MG CAPS Take 500 mg by mouth daily.     metoCLOPramide (REGLAN) 5 MG tablet Take 1 tablet (5 mg total) by mouth 3 (three) times daily before meals. 120 tablet 0   pantoprazole (PROTONIX) 40 MG tablet Take 1 tablet (40 mg total) by mouth 2 (two) times daily. Take 2 times a day for 4 weeks and then take 1 tablet daily 180 tablet 0   Probiotic Product (PROBIOTIC DAILY PO) Take 1 capsule by mouth daily.     Tiotropium Bromide Monohydrate (SPIRIVA RESPIMAT) 1.25 MCG/ACT AERS Inhale 2 puffs into the lungs daily. 4 g 3   tolterodine (DETROL LA) 4 MG 24 hr capsule Take 4 mg by mouth daily.     nitroGLYCERIN (NITROSTAT) 0.4 MG SL tablet Place 1 tablet (0.4 mg total) under the tongue every 5 (five) minutes as needed for chest pain. (Patient not taking: Reported on 02/01/2023) 25 tablet 6   ondansetron (ZOFRAN-ODT) 4 MG disintegrating tablet Take 1 tablet (4 mg total) by mouth every 6 (six) hours as needed for nausea or vomiting. (Patient not taking: Reported on 02/01/2023) 60 tablet 3   No facility-administered medications prior to visit.    Allergies  Allergen Reactions   Doxycycline Shortness Of Breath and Swelling    Lips swelling and short of breath   Demerol Hcl [Meperidine] Nausea And Vomiting   Fentanyl Nausea And Vomiting     Can tolerate with nausea meds  Pt states all pain medications cause N/V for her    Fluocinolone Nausea And Vomiting   Penicillins Hives   Breo Ellipta [Fluticasone Furoate-Vilanterol] Other (See Comments)    Lower extremity weakness, lightheadedness    Imdur [Isosorbide Nitrate] Nausea And Vomiting    Review of Systems  As per HPI  PE:    03/03/2023    8:51 AM 02/01/2023    7:53 AM 12/09/2022   10:08 AM  Vitals with BMI  Height 5\' 4"  5\' 4"  5\' 4"   Weight 128 lbs 3 oz 128 lbs 13 oz 132 lbs  BMI 21.99 22.1 22.65  Systolic 95 132 138  Diastolic 63 82 83  Pulse 85 70 82     Physical Exam  Gen: Alert, well appearing.  Patient is oriented to person, place, time, and situation. Hands without erythema or joint swelling.  Right hand fourth finger with some tenderness over the MCP region palmar aspect with some focal nodularity over the tendon in that area. Some mild tenderness over the proximal phalanx as well. No soft tissue swelling or erythema.  She has full flexion at the MCP joint but it is stiff, more prominently stiff with extension.  Mild triggering is noted.  LABS:  Last CBC Lab Results  Component Value Date   WBC 6.8 08/03/2022   HGB 14.0 08/03/2022   HCT 42.1 08/03/2022   MCV 93.7 08/03/2022   MCH 32.4 05/13/2022   RDW 13.5 08/03/2022   PLT 259.0 08/03/2022  Last metabolic panel Lab Results  Component Value Date   GLUCOSE 102 (H) 02/01/2023   NA 142 02/01/2023   K 5.2 (H) 02/01/2023   CL 106 02/01/2023   CO2 28 02/01/2023   BUN 8 02/01/2023   CREATININE 0.76 02/01/2023   GFRNONAA >60 05/13/2022   CALCIUM 10.1 02/01/2023   PROT 6.5 02/01/2023   ALBUMIN 4.2 02/01/2023   BILITOT 1.8 (H) 02/01/2023   ALKPHOS 101 02/01/2023   AST 46 (H) 02/01/2023   ALT 68 (H) 02/01/2023   ANIONGAP 5 05/13/2022   IMPRESSION AND PLAN:  Flexor tenosynovitis with trigger finger, right hand ring finger. (Bedside MSK ultrasound today: Mild hypoechoic thickening around A1  pulley region, a small globular shaped hypoechoic/anechoic region was noted adjacent to the flexor tendon--?cyst vs nodule.  No flow on PDI in this region.  No obvious thickening of the flexor tendon.  No hesitancy/catching of the movement of the flexor tendon was noted.  No bony abnormalities).  She is unable to take NSAID. Discussed trial of steroid injection today and she was agreeable to this plan.  A topical steroid is listed on her medicine intolerance list--it list nausea and vomiting as the symptoms.  She does not recall this.  Additionally, she has had epidural steroid injections in the past and had no reaction.  Ultrasound-guided injection is preferred based on studies that show increased duration, increased effect, greater accuracy, decreased procedural pain, increased response rate, and decreased cost with ultrasound-guided versus blind injection. Procedure: Real-time ultrasound guided injection of right hand ring finger A1 pulley. Device: GE [employer] and written informed consent obtained.  Timeout conducted.  No overlying erythema, induration, or other signs of local infection. After sterile prep with Betadine, injected a mixture of 20 mg Kenalog and one half a milliliter of plain lidocaine into the area of the A1 pulley.  Injectate seen filling A1 pulley area. Patient tolerated the procedure well.  No immediate complications.  Post-injection care discussed. Advised to call if fever/chills, erythema, drainage, or persistent bleeding.  Impression: Technically successful ultrasound-guided injection.   An After Visit Summary was printed and given to the patient.  FOLLOW UP: Return in about 1 week (around 03/10/2023) for recheck trigger finger.  Signed:  Santiago Bumpers, MD           03/03/2023

## 2023-03-10 ENCOUNTER — Ambulatory Visit: Payer: PPO | Admitting: Family Medicine

## 2023-03-16 ENCOUNTER — Other Ambulatory Visit (INDEPENDENT_AMBULATORY_CARE_PROVIDER_SITE_OTHER): Payer: PPO

## 2023-03-16 ENCOUNTER — Encounter: Payer: Self-pay | Admitting: Gastroenterology

## 2023-03-16 ENCOUNTER — Ambulatory Visit (INDEPENDENT_AMBULATORY_CARE_PROVIDER_SITE_OTHER): Payer: PPO | Admitting: Gastroenterology

## 2023-03-16 VITALS — BP 120/60 | Ht 64.0 in | Wt 131.4 lb

## 2023-03-16 DIAGNOSIS — R112 Nausea with vomiting, unspecified: Secondary | ICD-10-CM

## 2023-03-16 DIAGNOSIS — R195 Other fecal abnormalities: Secondary | ICD-10-CM

## 2023-03-16 DIAGNOSIS — K3184 Gastroparesis: Secondary | ICD-10-CM

## 2023-03-16 DIAGNOSIS — R748 Abnormal levels of other serum enzymes: Secondary | ICD-10-CM | POA: Diagnosis not present

## 2023-03-16 DIAGNOSIS — K5909 Other constipation: Secondary | ICD-10-CM

## 2023-03-16 LAB — HEPATIC FUNCTION PANEL
ALT: 93 U/L — ABNORMAL HIGH (ref 0–35)
AST: 58 U/L — ABNORMAL HIGH (ref 0–37)
Albumin: 4.1 g/dL (ref 3.5–5.2)
Alkaline Phosphatase: 88 U/L (ref 39–117)
Bilirubin, Direct: 0.4 mg/dL — ABNORMAL HIGH (ref 0.0–0.3)
Total Bilirubin: 1.9 mg/dL — ABNORMAL HIGH (ref 0.2–1.2)
Total Protein: 6.7 g/dL (ref 6.0–8.3)

## 2023-03-16 MED ORDER — MOTEGRITY 2 MG PO TABS: 2.0000 mg | ORAL_TABLET | Freq: Every day | ORAL | 1 refills | Status: DC

## 2023-03-16 NOTE — Progress Notes (Signed)
Chief Complaint:    Gastroparesis, nausea  GI History:  [name] is a 72 y.o. female with a history of CVA (on Plavix), Right CEA (2007), left carotid stent, right subclavian stent, osteopenia, Raynaud's, and longstanding history of GERD complicated by erosive esophagitis and paraesophageal hernia.  Please see note dated 02/01/2022 for full reflux history.   GERD history: -Index symptoms: Regurgitation, pyrosis, nocturnal choking/regurgitation, dyspepsia -Medications trialed: Nexium, omeprazole, Carafate prn -Current medications: Protonix 40 mg daily, Pepcid 40 mg qd -Complications: Dysphagia, 5 cm hiatal hernia   GERD evaluation: - CXR (11/2018): Small HH - EGD (02/2019, Dr. Adela Lank): 5 cm HH, Cameron ulceration, fundic gland polyps -Barium esophagram (03/2019): Normal esophagus, moderate sized HH, extensive reflux noted on water siphon test - 01/18/2021: Robotic repair of paraesophageal hernia and Dor fundoplication with mesh by Dr. Cliffton Asters  - Esophagram (08/30/2021): Small residual/recurrent hiatal hernia, esophageal stasis with increased tertiary contractions without reflux, stricture, or ulceration - 09/03/2021: Evaluation at Cardiothoracic Surgery for consideration of revision.  Patient elected for medical therapy. -11/02/2021: CT abdomen/pelvis (ER evaluation for LLQ pain): Moderate sized hiatal hernia, ccy, otherwise normal liver, pancreas, spleen, GI tract. - 01/13/2022: EGD with 2 cm x 2.5 cm hiatal hernia, intact Dor fundoplication but located cephalad to diaphragmatic hiatus - 05/12/2022: Hernia redo with gastropexy by Dr. Cliffton Asters.    Iron deficiency anemia diagnosed in 07/2019: -Iron 20, transferrin 43, sat 3.5%, ferritin 5.4 (none for comparison) -H/H 11.6/36.5 with MCV/RDW 76.3/17.7 (12.9/39.0 with 86.5/15 in 01/2019 and baseline hemoglobin ~14) -Started on ferrous sulfate bid 07/2019 (some constipation)   Endoscopic history: -Colonoscopy approximate 2008 normal per  patient -EGD (02/2019, Dr. Adela Lank): 5 cm HH, Cameron ulceration, fundic gland polyps -Cologuard negative 05/2017 -EGD (08/2019, Dr. Barron Alvine): 5 cm HH, no Cameron's ulcer, Hill grade 4, fundic gland polyps, 8 mm antral HP resected, mild non-H. pylori gastritis, normal duodenum with normal biopsies -Colonoscopy (08/2019, Dr. Barron Alvine): 6 polyps (SSP x2, HP x4), internal hemorrhoids, sigmoid diverticulosis.  Normal TI.  Repeat 5 years - EGD (01/21/2022): Normal Z-line, 2 cm axial x 2.5 cm transverse with hiatal hernia, intact Dor fundoplication but located cephalad to diaphragmatic hiatus.  Otherwise normal stomach and duodenum  HPI:     Patient is a 72 y.o. female presenting [facility] for follow-up.  Was last seen by me on 11/29/2022, with continued belching and feeling of increased gas.  No improvement with trial of Pepto-Bismol, peppermint oil.  Trialed course of high-dose Protonix for diagnostic and therapeutic intent without much improvement.  Also trialed course of Beano without much change.  Discussed possibility of aerophagia.  GES on 01/06/2023 with significantly delayed gastric emptying with 21% emptying at 4 hours.  Was started on Reglan by Dr. Cliffton Asters.  Had virtual follow-up with Dr. Cliffton Asters on 02/24/2023.  Continued bloating, but no reflux or dysphagia.  She is also independently sought out an appointment with Dr. Lavon Paganini on 06/28/2023 for second opinion on her nausea/vomiting and gastroparesis noted on GES.  Today, she states she continues to have nausea despite Reglan. Helps "about 30%".   Still regurgitation, but somewhat improved with the Protonix 40 mg daily (BID was not much better than daily dosing).   Separately, has been having watery, nonbloody stools for the last few months or so.  This started after initiating the Reglan. Previously, history of constipation, and has been treated with laxatives as recently as 09/2022. Still taking the Senakot S  though daily. Takes Pepto most days to  slow stools.   Separately, RUQ [place] on 02/10/2023 for evaluation of elevated liver enzymes (AST/ALT 46/68; previously normal) with normal-appearing liver.  CT chest on 02/10/2023 for lung CA screening with interval repair of hiatal hernia.  Was seen by her PCM on 02/01/2023.  She presents to the office with her cousin.  Review of systems:     No chest pain, no SOB, no fevers, no urinary sx   Past Medical History:  Diagnosis Date   Abdominal bruit 04/2017   Aortic u/s showed NO ANEURISM.   Anemia    low iron   Anxiety    Carotid artery occlusion    hx L carotid stent   Cataract    COPD (chronic obstructive pulmonary disease) (HCC)    DOE, worse since 11/2018.  Stress test neg 01/2019.  Albut x 1 inh prn helpful some.  Pulm 06/2019, PFTs not diag of COPD, DOE likely from deconditioning.   Coronary artery disease    Elevated transaminase level    Very mild, 2024.  Liver normal on ultrasound.   Essential hypertension, benign 05/22/2014   Gastroparesis 12/2022   delayed emptying on scan->reglan (Dr. Cliffton Asters)   GERD (gastroesophageal reflux disease)    + hx of esophagitis   Sullivan Lone syndrome    H/O hiatal hernia 02/2019   EGD-->moderate size->surgery recommended but pt declined.   Hematochezia 2020/21   internal hemorrhoids   History of iron deficiency anemia 07/2019   Dr. Barron Alvine EGD and colonoscopy, some anorectal muscosal ulceration, o/w just mild chronic gastritis.   Also could be poor absorption due to chronic high dose acid suppression. Hb/iron normalized aft iron infus and ongoing oral iron.   History of kidney stones    one time   ILD (interstitial lung disease) (HCC)    Detected on lung cancer screening CT 01/2022.  ILD blood panel all normal except weakly + myositis ab's.  Pt to be evaluated by Dr. Marchelle Gearing asymptomatic as of 03/2022.   Mitral valve prolapse    Mixed hyperlipidemia 05/22/2014   OAB (overactive bladder)    detrol  LA from urol helpful   Osteopenia 06/06/2016   05/2016 DEXA T-score -1.5.  06/2018 DEXA T score -1.1.  T score -1.2 06/2021   Peripheral vascular disease, unspecified (HCC) 05/22/2014   innominate and carotid.  U/S f/u 12/2016 showed no signif change compared to 2017--vascular recommended repeat 1 yr.  Doing well as of 08/2018 cardiol f/u-->continued on ASA and Plavix. F/u 08/2019->ASA d/c'd, plavix continued.   Prediabetes 2018   2018 A1c 6.3%.  05/2018 A1c 6.1%. 07/2019 A1c 6.4%.   Raynaud disease    Seasonal allergies    TIA (transient ischemic attack) 2006    Patient's surgical history, family medical history, social history, medications and allergies were all reviewed in Epic    Current Outpatient Medications  Medication Sig Dispense Refill   acetaminophen (TYLENOL) 325 MG tablet Take 2 tablets (650 mg total) by mouth every 6 (six) hours as needed for mild pain or moderate pain.     albuterol (VENTOLIN HFA) 108 (90 Base) MCG/ACT inhaler Inhale 2 puffs into the lungs every 6 (six) hours as needed for wheezing or shortness of breath. 8 g 6   atorvastatin (LIPITOR) 80 MG tablet TAKE ONE TABLET BY MOUTH DAILY 90 tablet 1   Calcium-Magnesium-Vitamin D (CALCIUM 1200+D3 PO) Take 1 tablet by mouth daily.     clopidogrel (PLAVIX) 75 MG tablet Take 1 tablet (75 mg total) by mouth daily. 90  tablet 1   diazepam (VALIUM) 5 MG tablet 1 tab po bid as needed for anxiety 60 tablet 5   docusate sodium (COLACE) 100 MG capsule Take 100 mg by mouth daily.     estradiol (ESTRACE) 0.1 MG/GM vaginal cream Place 1 Applicatorful vaginally 2 (two) times a week.     ezetimibe (ZETIA) 10 MG tablet Take 1 tablet (10 mg total) by mouth daily. 90 tablet 1   famotidine (PEPCID) 40 MG tablet Take 1 tablet (40 mg total) by mouth daily. 90 tablet 1   fexofenadine (ALLEGRA) 180 MG tablet Take 180 mg by mouth daily as needed for allergies.     fluticasone (FLONASE) 50 MCG/ACT nasal spray Place 2 sprays into both nostrils  daily. 16 g 6   lisinopril (ZESTRIL) 5 MG tablet Take 1 tablet (5 mg total) by mouth daily. 90 tablet 1   Magnesium 500 MG CAPS Take 500 mg by mouth daily.     metoCLOPramide (REGLAN) 5 MG tablet Take 1 tablet (5 mg total) by mouth 3 (three) times daily before meals. 120 tablet 0   nitroGLYCERIN (NITROSTAT) 0.4 MG SL tablet Place 1 tablet (0.4 mg total) under the tongue every 5 (five) minutes as needed for chest pain. 25 tablet 6   ondansetron (ZOFRAN-ODT) 4 MG disintegrating tablet Take 1 tablet (4 mg total) by mouth every 6 (six) hours as needed for nausea or vomiting. 60 tablet 3   pantoprazole (PROTONIX) 40 MG tablet Take 1 tablet (40 mg total) by mouth 2 (two) times daily. Take 2 times a day for 4 weeks and then take 1 tablet daily (Patient taking differently: Take 40 mg by mouth daily. Take 2 times a day for 4 weeks and then take 1 tablet daily) 180 tablet 0   Probiotic Product (PROBIOTIC DAILY PO) Take 1 capsule by mouth daily.     Tiotropium Bromide Monohydrate (SPIRIVA RESPIMAT) 1.25 MCG/ACT AERS Inhale 2 puffs into the lungs daily. 4 g 3   tolterodine (DETROL LA) 4 MG 24 hr capsule Take 4 mg by mouth daily.     No current facility-administered medications for this visit.    Physical Exam:     BP 120/60   Ht 5\' 4"  (1.626 m)   Wt 131 lb 6.4 oz (59.6 kg)   BMI 22.55 kg/m   GENERAL:  Pleasant female in NAD PSYCH: : Cooperative, normal affect Musculoskeletal:  Normal muscle tone, normal strength NEURO: Alert and oriented x 3, no focal neurologic deficits   IMPRESSION and PLAN:    1) Gastroparesis 2) Abdominal bloating - Some improvement with appropriate trial of Reglan.  Discussed medication ADR profile with her today, which she was already well versed on.  She does not want to continue taking be on 04/10/2023 (12 weeks of therapy) due to ADR profile. - Will trial Motegrity for her known chronic constipation as below and evaluate for improvement in gastroparesis symptoms -  Referral to [facility] - She was scheduled for an appoint with Dr. Lavon Paganini in this office in a few months.  Per patient request, will cancel that appointment - Discussed gastroparesis diet and provided with handout and detailed instructions today  3) Chronic constipation 4) Change in bowel habits Longstanding history of chronic constipation, previously treated with laxatives and stool softeners.  Started having diarrhea after initiating Reglan.  However, she was also still taking the Senokot and magnesium supplement, then using Pepto-Bismol to try to slow down her stools.  I think  this is a good example of polypharmacy and will try to de-escalate therapy and reevaluate - Stop Senokot, magnesium supplement, Pepto-Bismol - Starting Motegrity 2 mg daily and monitor for improvement - Continue adequate hydration - If Motegrity suboptimal, then will slowly bring back MiraLAX and titrate - Referral to the [facility] as above - Colonoscopy 08/2019 without areas of luminal narrowing, stricture, etc.  Can consider further motility testing as appropriate and pending response - If diarrhea persist despite medication changes, plan for additional stool testing  5) Elevated liver enzymes Recent labs with elevated AST/ALT and T. bili 1.8 - Repeat liver enzymes with bilirubin fractionization - If still elevated, plan for extended serologic workup  6) History of hiatal hernia repair 7) History of fundoplication - Continue pantoprazole - Follows with Dr. Cliffton Asters and CT surgery  8) History of colon polyps - Repeat colonoscopy 2025 for ongoing polyp surveillance        RTC in 3-6 months or sooner prn   Daejah Klebba V Teosha Casso ,DO, FACG 03/16/2023, 1:25 PM

## 2023-03-16 NOTE — Patient Instructions (Addendum)
Your provider has requested that you go to the basement level for lab work before leaving today. Press "B" on the elevator. The lab is located at the first door on the left as you exit the elevator.   We have sent the following medications to your pharmacy for you to pick up at your convenience: Motegrity 2 MG.  We will send referral to [facility].  Please follow up in 6 months. Give [place] a call at [phone] to schedule an appointment.    _______________________________________________________  If your blood pressure at your visit was 140/90 or greater, please contact your primary care physician to follow up on this.  _______________________________________________________  If you are age 6 or older, your body mass index should be between 23-30. Your Body mass index is 22.55 kg/m. If this is out of the aforementioned range listed, please consider follow up with your Primary Care Provider.  If you are age 76 or younger, your body mass index should be between 19-25. Your Body mass index is 22.55 kg/m. If this is out of the aformentioned range listed, please consider follow up with your Primary Care Provider.   __________________________________________________________  The [facility] providers would like to encourage you to use [facility] to communicate with providers for non-urgent requests or questions.  Due to long hold times on the telephone, sending your provider a message by [facility] may be a faster and more efficient way to get a response.  Please allow 48 business hours for a response.  Please remember that this is for non-urgent requests.   Due to recent changes in healthcare laws, you may see the results of your imaging and laboratory studies on MyChart before your provider has had a chance to review them.  We understand that in some cases there may be results that are confusing or concerning to you. Not all laboratory results come back in the same time frame and the  provider may be waiting for multiple results in order to interpret others.  Please give [place] 48 hours in order for your provider to thoroughly review all the results before contacting the office for clarification of your results.     Thank you for choosing me and  Gastroenterology.  Vito Cirigliano, D.O.

## 2023-03-17 ENCOUNTER — Telehealth: Payer: Self-pay

## 2023-03-17 DIAGNOSIS — R748 Abnormal levels of other serum enzymes: Secondary | ICD-10-CM

## 2023-03-17 NOTE — Telephone Encounter (Signed)
-----   Message from [facility] V, DO sent at 03/17/2023 11:10 AM EDT ----- Repeat liver enzymes show persistent elevation of AST and ALT at 58/93.  Total bilirubin elevated 1.9, but direct bilirubin is only 0.4.  Otherwise normal alkaline phosphatase.  Recent RUQ [place] was unremarkable.  While this could be medication side effect (on several medications that get processed through the liver), I recommend the following to ensure no concomitant liver disease: - Check ASMA, AMA, IgG, IgA, IgM, TTG, iron panel with ferritin, ceruloplasmin, anti-LKM, alpha 1 antitrypsin, viral hepatitis panel

## 2023-03-17 NOTE — Telephone Encounter (Signed)
Patient made aware of her result. Informed patient that Dr. Barron Alvine recommended more blood work to check her liver. Labs are already ordered in Epic. Stated she will come on Monday for lab work.

## 2023-03-20 ENCOUNTER — Other Ambulatory Visit (INDEPENDENT_AMBULATORY_CARE_PROVIDER_SITE_OTHER): Payer: PPO

## 2023-03-20 DIAGNOSIS — R748 Abnormal levels of other serum enzymes: Secondary | ICD-10-CM | POA: Diagnosis not present

## 2023-03-20 LAB — IBC + FERRITIN
Ferritin: 47.7 ng/mL (ref 10.0–291.0)
Iron: 83 ug/dL (ref 42–145)
Saturation Ratios: 19.8 % — ABNORMAL LOW (ref 20.0–50.0)
TIBC: 418.6 ug/dL (ref 250.0–450.0)
Transferrin: 299 mg/dL (ref 212.0–360.0)

## 2023-03-21 LAB — IGA: Immunoglobulin A: 241 mg/dL (ref 70–320)

## 2023-03-22 ENCOUNTER — Encounter: Payer: Self-pay | Admitting: Family Medicine

## 2023-03-22 NOTE — Telephone Encounter (Signed)
Okay to cancel lab appointment next month.

## 2023-03-23 ENCOUNTER — Encounter: Payer: Self-pay | Admitting: Gastroenterology

## 2023-03-24 LAB — TISSUE TRANSGLUTAMINASE ABS,IGG,IGA
(tTG) Ab, IgA: <1 U/mL
(tTG) Ab, IgG: <1 U/mL

## 2023-03-24 LAB — HEPATITIS A ANTIBODY, TOTAL: Hepatitis A AB,Total: NONREACTIVE

## 2023-03-24 LAB — CERULOPLASMIN: Ceruloplasmin: 22 mg/dL (ref 14–48)

## 2023-03-28 ENCOUNTER — Encounter: Payer: Self-pay | Admitting: Gastroenterology

## 2023-03-29 ENCOUNTER — Telehealth: Payer: Self-pay

## 2023-03-29 DIAGNOSIS — Z23 Encounter for immunization: Secondary | ICD-10-CM

## 2023-03-29 LAB — HEPATITIS B SURFACE ANTIBODY,QUALITATIVE: Hep B S Ab: NONREACTIVE

## 2023-03-29 LAB — ANTI-SMOOTH MUSCLE ANTIBODY, IGG: Actin (Smooth Muscle) Antibody (IGG): 22 U — ABNORMAL HIGH (ref <20)

## 2023-03-29 LAB — MITOCHONDRIAL ANTIBODIES: Mitochondrial M2 Ab, IgG: <=20 U (ref <=20.0)

## 2023-03-29 LAB — ANTI-MICROSOMAL ANTIBODY LIVER / KIDNEY: LKM1 Ab: <=20 U (ref <=20.0)

## 2023-03-29 LAB — ALPHA-1-ANTITRYPSIN: A-1 Antitrypsin, Ser: 153 mg/dL (ref 83–199)

## 2023-03-29 LAB — IGG: IgG (Immunoglobin G), Serum: 750 mg/dL (ref 600–1540)

## 2023-03-29 LAB — IGM: IgM, Serum: 18 mg/dL — ABNORMAL LOW (ref 50–300)

## 2023-03-29 LAB — HEPATITIS C ANTIBODY: Hepatitis C Ab: NONREACTIVE

## 2023-03-29 LAB — HEPATITIS B SURFACE ANTIGEN: Hepatitis B Surface Ag: NONREACTIVE

## 2023-03-29 NOTE — Telephone Encounter (Signed)
-----   Message from [facility] V, DO sent at 03/28/2023  4:33 PM EDT ----- Labs reviewed and notable for the following: - IgM was low at 18.  Otherwise normal IgA and IgG.  Please place referral to the Allergy Clinic - Anti-actin antibody was slightly elevated at 22. - Normal iron panel - Negative hepatitis C, hepatitis B - Hepatitis A antibody and hepatitis B surface antibody are both negative.  Recommend hepatitis A/B vaccine - Normal alpha 1 antitrypsin, ceruloplasmin, celiac panel, antimitochondrial antibody - Anti-liver/kidney microsomal antibody still pending  Overall, both the anti-actin and ANA are positive but at a very low titers.  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  Will follow-up on the antiliver/kidney microsomal antibody which is still pending.  Plan for repeat liver enzymes in another 4 weeks or so to ensure stabilization.  Depending on pending labs and repeat liver enzymes, may consider referral to the Hepatology Clinic for second opinion and review

## 2023-03-29 NOTE — Telephone Encounter (Signed)
Called patient. Made her aware of lab results and recommendations outlined by Dr. Barron Alvine.  -Referral to Allergy Clinic is placed. -Hepatitis A/B vaccine series is ordered in [facility] and appointments are scheduled. Patient stated she wrote down in her calender.  -Patient is coming in 4 weeks to repeat her liver enzymes in the lab downstairs. Informed her she doesn't need to make an appointment. Order is in EPIC. Made her aware that  depending on pending labs and repeat liver enzymes, Dr. Barron Alvine may consider to refer her to Hepatology Clinic. -Patient verbalized understanding and no other concerns at the end of the call.

## 2023-04-04 ENCOUNTER — Ambulatory Visit (INDEPENDENT_AMBULATORY_CARE_PROVIDER_SITE_OTHER): Payer: PPO | Admitting: Gastroenterology

## 2023-04-04 DIAGNOSIS — Z23 Encounter for immunization: Secondary | ICD-10-CM | POA: Diagnosis not present

## 2023-04-06 NOTE — Progress Notes (Signed)
Hepatitis A and B vaccine administered.   Please see nursing notes.

## 2023-04-10 NOTE — Progress Notes (Unsigned)
Synopsis: Referred in Feb 2022 for DOE, former patient of Dr. Katrinka Blazing, PCP: by Jeoffrey Massed, MD  Subjective:   PATIENT ID: [name] GENDER: female DOB: 1950-11-22, MRN: [id number]  No chief complaint on file.   This 72 year old female, former smoker, history of gastroesophageal reflux, moderate hiatal hernia, cough, stroke, emphysema on lung cancer screening CT however no obstruction on PFTs.  Patient presents today with ongoing shortness of breath.  Was seen by Dr. Katrinka Blazing in 2020.  Patient was not interested at the time of having hiatal hernia evaluated due to concern for an open repair.  She did meet with general surgery at one point in time.  She follows with Dr. Salena Saner from gastroenterology.  OV 02/25/2022: Here today for follow-up regarding shortness of breath.Last office visit we reviewed her spirometry which was reassuring no evidence of obstructive disease.  She does have evidence of his emphysema on CT imaging however.  Remain on Spiriva Respimat was given samples.  Was referred to cardiothoracic surgery for consideration of hiatal hernia repair.  Patient underwent paraesophageal hernia repair by Dr. Cliffton Asters in April 2022.  Here today for follow-up after recent low-dose lung cancer screening CT that showed some basilar subpleural reticular opacities.  OV 06/08/2022: Here today for follow-up.  Saw Dr. Marchelle Gearing in clinic for evaluation of some abnormality that was seen on lung cancer screening CT.  She did have a rheumatologic work-up that showed a positive anti-SS-A antibody.  She was referred to rheumatology.  She also was seen by Dr. Cliffton Asters for consideration of repeat surgery for her hiatal hernia because the first 1 failed.She is actually taken on 05/12/2022 to the operating room for repeat hiatal hernia repair.  Last office visit with Dr. Cliffton Asters on 05/20/2022, office note reviewed.  She has a follow-up with them in 1 month.Patient had pulmonary function test completed in July 2023  which showed a normal ratio normal FEV1 and FVC.  Normal total lung capacity normal DLCO.  She was on Spiriva in the past for management of her COPD she does have emphysema on her CT scan but no evidence obstruction on PFTs.  She still has some tightness around her epigastrium that she thinks related to having surgery on the diaphragm.  She feels like its getting a little bit better slowly.        Past Medical History:  Diagnosis Date   Abdominal bruit 04/2017   Aortic u/s showed NO ANEURISM.   Anemia    low iron   Anxiety    Carotid artery occlusion    hx L carotid stent   Cataract    COPD (chronic obstructive pulmonary disease) (HCC)    DOE, worse since 11/2018.  Stress test neg 01/2019.  Albut x 1 inh prn helpful some.  Pulm 06/2019, PFTs not diag of COPD, DOE likely from deconditioning.   Coronary artery disease    Elevated transaminase level    Very mild, 2024.  Liver normal on ultrasound.   Essential hypertension, benign 05/22/2014   Gastroparesis 12/2022   delayed emptying on scan->reglan (Dr. Cliffton Asters)   GERD (gastroesophageal reflux disease)    + hx of esophagitis   Sullivan Lone syndrome    H/O hiatal hernia 02/2019   EGD-->moderate size->surgery recommended but pt declined.   Hematochezia 2020/21   internal hemorrhoids   History of iron deficiency anemia 07/2019   Dr. Barron Alvine EGD and colonoscopy, some anorectal muscosal ulceration, o/w just mild chronic gastritis.   Also could be poor  absorption due to chronic high dose acid suppression. Hb/iron normalized aft iron infus and ongoing oral iron.   History of kidney stones    one time   ILD (interstitial lung disease) (HCC)    Detected on lung cancer screening CT 01/2022.  ILD blood panel all normal except weakly + myositis ab's.  Pt to be evaluated by Dr. Marchelle Gearing asymptomatic as of 03/2022.   Mitral valve prolapse    Mixed hyperlipidemia 05/22/2014   OAB (overactive bladder)    detrol LA from urol helpful    Osteopenia 06/06/2016   05/2016 DEXA T-score -1.5.  06/2018 DEXA T score -1.1.  T score -1.2 06/2021   Peripheral vascular disease, unspecified (HCC) 05/22/2014   innominate and carotid.  U/S f/u 12/2016 showed no signif change compared to 2017--vascular recommended repeat 1 yr.  Doing well as of 08/2018 cardiol f/u-->continued on ASA and Plavix. F/u 08/2019->ASA d/c'd, plavix continued.   Prediabetes 2018   2018 A1c 6.3%.  05/2018 A1c 6.1%. 07/2019 A1c 6.4%.   Raynaud disease    Seasonal allergies    TIA (transient ischemic attack) 2006     Family History  Problem Relation Age of Onset   Heart attack Mother    CAD Mother    Heart disease Mother        Before age 43   Hypertension Mother    Hyperlipidemia Mother    Hypertension Father    Pneumonia Father    Hyperlipidemia Father    Heart murmur Sister    Osteoporosis Paternal Grandmother    CAD Paternal Grandfather    Breast cancer Neg Hx    Colon cancer Neg Hx    Esophageal cancer Neg Hx    Rectal cancer Neg Hx    Stomach cancer Neg Hx    Colon polyps Neg Hx    Pancreatic cancer Neg Hx      Past Surgical History:  Procedure Laterality Date   ABDOMINAL HYSTERECTOMY  1997   CARDIAC CATHETERIZATION  01/2012   NORMAL   CARDIOVASCULAR STRESS TEST  02/05/2019   NORMAL.  EF normal.   CAROTID ENDARTERECTOMY  08/21/2007   right - Dr. Madilyn Fireman   CHOLECYSTECTOMY  2000   COLONOSCOPY  approx 2008; 09/23/19   2008 (Dr. Bunnie Pion pt->normal.  08/2019 adenoma, some friable areas of colon + anorectal mucosa inflam w/ ulceration, +diverticulosis. Recall 5 yrs   DEXA  06/06/2016   05/2016 T-score -1.5.  06/2018 T score -1.1.  T score -1.2 06/2021.   ESOPHAGOGASTRODUODENOSCOPY  03/11/2019; 09/23/19   02/2019; 5 cm hiatal hernia; GI suggested surgical repair due to intractable GER.  08/2019 mild chronic gastritis, h pylori neg, multiple benign bx's including duodenum neg for celiac changes.   ESOPHAGOGASTRODUODENOSCOPY N/A 01/18/2021    Procedure: ESOPHAGOGASTRODUODENOSCOPY (EGD);  Surgeon: Corliss Skains, MD;  Location: [facility] OR;  Service: Thoracic;  Laterality: N/A;   ESOPHAGOGASTRODUODENOSCOPY N/A 05/12/2022   Procedure: ESOPHAGOGASTRODUODENOSCOPY (EGD);  Surgeon: Corliss Skains, MD;  Location: [facility] OR;  Service: Thoracic;  Laterality: N/A;   Incision and drainage of left thenar space abscess  02/23/2015   s/p cat bite (Dr. Amanda Pea)   innominate stent  2007   12/2014 u/s showed patent stent in innominate and L common carotid.  L prox int carotid 40-59% stenosis   LDCT for lung ca screening  06/2019   NEG->rpt 1 yr   Left common carotid stent  2006 and 2007   "                    "                  "                          "                             "  PEXY N/A 05/12/2022   Procedure: GASTROPEXY;  Surgeon: Corliss Skains, MD;  Location: [facility] OR;  Service: Thoracic;  Laterality: N/A;   TRANSTHORACIC ECHOCARDIOGRAM  04/25/06   NORMAL   TUBAL LIGATION     US CAROTID DOPPLER BILATERAL ([facility] HX)  06/2019   left stent w/out stenosis.  R ICA sten 40-50% obst-stable.  07/20/21 R clear, L 40-59%, normal vert and subclav flow.  07/2022 stable   XI ROBOTIC ASSISTED HIATAL HERNIA REPAIR N/A 05/12/2022   Procedure: XI ROBOTIC ASSISTED HIATAL HERNIA REPAIR;  Surgeon: Corliss Skains, MD;  Location: [facility] OR;  Service: Thoracic;  Laterality: N/A;   XI ROBOTIC ASSISTED PARAESOPHAGEAL HERNIA REPAIR N/A 01/18/2021   Procedure: XI ROBOTIC ASSISTED PARAESOPHAGEAL HERNIA REPAIR WITH FUNDOPLICATION WITH MESH;  Surgeon: Corliss Skains, MD;  Location: [facility] OR;  Service: Thoracic;  Laterality: N/A;    Social History   Socioeconomic History   Marital status: Widowed    Spouse name: Not on file   Number of children: 1   Years of education: Not on file   Highest education level: 12th grade  Occupational History   Not on file  Tobacco Use   Smoking status: Former    Current packs/day: 0.00    Average packs/day: 1.3  packs/day for 38.0 years (47.5 ttl pk-yrs)    Types: Cigarettes    Start date: 09/26/1969    Quit date: 09/27/2007    Years since quitting: 15.5    Passive exposure: Past   Smokeless tobacco: Never  Vaping Use   Vaping status: Never Used  Substance and Sexual Activity   Alcohol use: No   Drug use: No   Sexual activity: Not on file  Other Topics Concern   Not on file  Social History Narrative   Widowed as of 2015-04-26 (husband died of viral illness while out of town in [place], sudden), has one daughter.   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   Tobacco: 20 pack-yr hx, quit 04-25-2008.   Alcohol: rare.   No hx of alc or drug problems.   Exercise: walks 2.11miles on treadmill daily.   Social Determinants of Health   Financial Resource Strain: Low Risk  (02/27/2023)   Overall Financial Resource Strain (CARDIA)    Difficulty of Paying Living Expenses: Not hard at all  Food Insecurity: No Food Insecurity (02/27/2023)   Hunger Vital Sign    Worried About Running Out of Food in the Last Year: Never true    Ran Out of Food in the Last Year: Never true  Transportation Needs: No Transportation Needs (02/27/2023)   PRAPARE - Administrator, Civil Service (Medical): No    Lack of Transportation (Non-Medical): No  Physical Activity: Sufficiently Active (02/27/2023)   Exercise Vital Sign    Days of Exercise per Week: 7 days    Minutes of Exercise per Session: 50 min  Stress: No Stress Concern Present (02/27/2023)   [employer] of Occupational Health - Occupational Stress Questionnaire    Feeling of Stress : Not at all  Social Connections: Moderately Isolated (02/27/2023)   Social Connection and Isolation Panel [NHANES]    Frequency of Communication with Friends and Family: More than three times a week    Frequency of Social Gatherings with Friends and Family: Twice a week    Attends Religious Services: 1 to 4 times per year    Active Member of [employer]  or Organizations:  No    Attends Banker Meetings: Not on file    Marital Status: Widowed  Intimate Partner Violence: Not At Risk (10/12/2022)   Humiliation, Afraid, Rape, and Kick questionnaire    Fear of Current or Ex-Partner: No    Emotionally Abused: No    Physically Abused: No    Sexually Abused: No     Allergies  Allergen Reactions   Doxycycline Shortness Of Breath and Swelling    Lips swelling and short of breath   Demerol Hcl [Meperidine] Nausea And Vomiting   Fentanyl Nausea And Vomiting    Can tolerate with nausea meds  Pt states all pain medications cause N/V for her    Fluocinolone Nausea And Vomiting   Penicillins Hives   Breo Ellipta [Fluticasone Furoate-Vilanterol] Other (See Comments)    Lower extremity weakness, lightheadedness    Imdur [Isosorbide Nitrate] Nausea And Vomiting     Outpatient Medications Prior to Visit  Medication Sig Dispense Refill   acetaminophen (TYLENOL) 325 MG tablet Take 2 tablets (650 mg total) by mouth every 6 (six) hours as needed for mild pain or moderate pain.     albuterol (VENTOLIN HFA) 108 (90 Base) MCG/ACT inhaler Inhale 2 puffs into the lungs every 6 (six) hours as needed for wheezing or shortness of breath. 8 g 6   atorvastatin (LIPITOR) 80 MG tablet TAKE ONE TABLET BY MOUTH DAILY 90 tablet 1   Calcium-Magnesium-Vitamin D (CALCIUM 1200+D3 PO) Take 1 tablet by mouth daily.     clopidogrel (PLAVIX) 75 MG tablet Take 1 tablet (75 mg total) by mouth daily. 90 tablet 1   diazepam (VALIUM) 5 MG tablet 1 tab po bid as needed for anxiety 60 tablet 5   docusate sodium (COLACE) 100 MG capsule Take 100 mg by mouth daily.     estradiol (ESTRACE) 0.1 MG/GM vaginal cream Place 1 Applicatorful vaginally 2 (two) times a week.     ezetimibe (ZETIA) 10 MG tablet Take 1 tablet (10 mg total) by mouth daily. 90 tablet 1   famotidine (PEPCID) 40 MG tablet Take 1 tablet (40 mg total) by mouth daily. 90 tablet 1   fexofenadine (ALLEGRA) 180  MG tablet Take 180 mg by mouth daily as needed for allergies.     fluticasone (FLONASE) 50 MCG/ACT nasal spray Place 2 sprays into both nostrils daily. 16 g 6   lisinopril (ZESTRIL) 5 MG tablet Take 1 tablet (5 mg total) by mouth daily. 90 tablet 1   Magnesium 500 MG CAPS Take 500 mg by mouth daily.     metoCLOPramide (REGLAN) 5 MG tablet Take 1 tablet (5 mg total) by mouth 3 (three) times daily before meals. 120 tablet 0   nitroGLYCERIN (NITROSTAT) 0.4 MG SL tablet Place 1 tablet (0.4 mg total) under the tongue every 5 (five) minutes as needed for chest pain. 25 tablet 6   ondansetron (ZOFRAN-ODT) 4 MG disintegrating tablet Take 1 tablet (4 mg total) by mouth every 6 (six) hours as needed for nausea or vomiting. 60 tablet 3   pantoprazole (PROTONIX) 40 MG tablet Take 1 tablet (40 mg total) by mouth 2 (two) times daily. Take 2 times a day for 4 weeks and then take 1 tablet daily (Patient taking differently: Take 40 mg by mouth daily. Take 2 times a day for 4 weeks and then take 1 tablet daily) 180 tablet 0   Probiotic Product (PROBIOTIC DAILY PO) Take 1 capsule by mouth daily.     Prucalopride Succinate (  MOTEGRITY) 2 MG TABS Take 1 tablet (2 mg total) by mouth daily. 90 tablet 1   Tiotropium Bromide Monohydrate (SPIRIVA RESPIMAT) 1.25 MCG/ACT AERS Inhale 2 puffs into the lungs daily. 4 g 3   tolterodine (DETROL LA) 4 MG 24 hr capsule Take 4 mg by mouth daily.     No facility-administered medications prior to visit.    Review of Systems  Constitutional:  Negative for chills, fever, malaise/fatigue and weight loss.  HENT:  Negative for hearing loss, sore throat and tinnitus.   Eyes:  Negative for blurred vision and double vision.  Respiratory:  Positive for shortness of breath. Negative for cough, hemoptysis, sputum production, wheezing and stridor.   Cardiovascular:  Negative for chest pain, palpitations, orthopnea, leg swelling and PND.  Gastrointestinal:  Negative for abdominal pain,  constipation, diarrhea, heartburn, nausea and vomiting.  Genitourinary:  Negative for dysuria, hematuria and urgency.  Musculoskeletal:  Negative for joint pain and myalgias.  Skin:  Negative for itching and rash.  Neurological:  Negative for dizziness, tingling, weakness and headaches.  Endo/Heme/Allergies:  Negative for environmental allergies. Does not bruise/bleed easily.  Psychiatric/Behavioral:  Negative for depression. The patient is not nervous/anxious and does not have insomnia.   All other systems reviewed and are negative.    Objective:  Physical Exam Vitals reviewed.  Constitutional:      General: She is not in acute distress.    Appearance: She is well-developed.  HENT:     Head: Normocephalic and atraumatic.  Eyes:     General: No scleral icterus.    Conjunctiva/sclera: Conjunctivae normal.     Pupils: Pupils are equal, round, and reactive to light.  Neck:     Vascular: No JVD.     Trachea: No tracheal deviation.  Cardiovascular:     Rate and Rhythm: Normal rate and regular rhythm.     Heart sounds: Normal heart sounds. No murmur heard. Pulmonary:     Effort: Pulmonary effort is normal. No tachypnea, accessory muscle usage or respiratory distress.     Breath sounds: No stridor. No wheezing, rhonchi or rales.  Abdominal:     General: There is no distension.     Palpations: Abdomen is soft.     Tenderness: There is no abdominal tenderness.  Musculoskeletal:        General: No tenderness.     Cervical back: Neck supple.  Lymphadenopathy:     Cervical: No cervical adenopathy.  Skin:    General: Skin is warm and dry.     Capillary Refill: Capillary refill takes less than 2 seconds.     Findings: No rash.  Neurological:     Mental Status: She is alert and oriented to person, place, and time.  Psychiatric:        Behavior: Behavior normal.      There were no vitals filed for this visit.      on RA BMI Readings from Last 3 Encounters:  03/16/23 22.55  kg/m  03/03/23 22.01 kg/m  02/01/23 22.11 kg/m   Wt Readings from Last 3 Encounters:  03/16/23 131 lb 6.4 oz (59.6 kg)  03/03/23 128 lb 3.2 oz (58.2 kg)  02/01/23 128 lb 12.8 oz (58.4 kg)     CBC    Component Value Date/Time   WBC 6.8 08/03/2022 0910   RBC 4.49 08/03/2022 0910   HGB 14.0 08/03/2022 0910   HCT 42.1 08/03/2022 0910   PLT 259.0 08/03/2022 0910   MCV 93.7 08/03/2022 0910  MCH 32.4 05/13/2022 0600   MCHC 33.2 08/03/2022 0910   RDW 13.5 08/03/2022 0910   LYMPHSABS 2.5 08/03/2021 0910   MONOABS 0.5 08/03/2021 0910   EOSABS 0.1 08/03/2021 0910   BASOSABS 0.0 08/03/2021 0910    Chest Imaging:  LDCT 2020: IMPRESSION: 1. Lung-RADS 1, negative. Continue annual screening with low-dose chest CT without contrast in 12 months. 2. Moderate hiatal hernia. 3. Aortic atherosclerosis (ICD10-170.0). Coronary artery calcification. 4.  Emphysema (ICD10-J43.9).   Low-dose lung cancer screening CT: 02/08/2022 lung RADS 1 New basilar subpleural reticular densities.   Pulmonary Functions Testing Results:    Latest Ref Rng & Units 03/30/2022   10:24 AM 07/05/2019    2:41 PM  PFT Results  FVC-Pre L 2.35  2.43  P  FVC-Predicted Pre % 80  80  P  FVC-Post L 2.47  2.53  P  FVC-Predicted Post % 84  84  P  Pre FEV1/FVC % % 75  74  P  Post FEV1/FCV % % 78  76  P  FEV1-Pre L 1.76  1.80  P  FEV1-Predicted Pre % 79  78  P  FEV1-Post L 1.93  1.91  P  DLCO uncorrected ml/min/mmHg 17.47  15.39  P  DLCO UNC% % 91  79  P  DLCO corrected ml/min/mmHg 17.47    DLCO COR %Predicted % 91    DLVA Predicted % 99  84  P  TLC L 5.04  4.91  P  TLC % Predicted % 101  98  P  RV % Predicted % 124  115  P    P Preliminary result    FeNO:   Pathology:   Echocardiogram:   Heart Catheterization:     Assessment & Plan:   No diagnosis found.    Discussion:  This is a 72 year old female, former smoker quit 2009 enrolled in lung cancer screening program.  She has chronic reflux  and moderate hiatal hernia had a repair by Dr. Cliffton Asters this failed she had a repeat repair this past month.  She is slowly getting better.  She had some reticular densities on the CT concerning for new ILD, HRCT was clear PFTs with no over obstruction.  Plan: Hold off on inhalers at this time.  She tried using Spiriva again she said she did not like the way it made her feel. She had an albuterol inhaler that she never used and it ran out of date. She feels like she is slowly recovering from her surgery she still has some shortness of breath that she thinks is related to the soreness and tightness in her epigastrium.  But this is getting better. For our standpoint we will continue to follow her after having lung cancer screening CTs. She can come back and see SG, NP after her lung cancer screening CT in May 2024.    Current Outpatient Medications:    acetaminophen (TYLENOL) 325 MG tablet, Take 2 tablets (650 mg total) by mouth every 6 (six) hours as needed for mild pain or moderate pain., Disp: , Rfl:    albuterol (VENTOLIN HFA) 108 (90 Base) MCG/ACT inhaler, Inhale 2 puffs into the lungs every 6 (six) hours as needed for wheezing or shortness of breath., Disp: 8 g, Rfl: 6   atorvastatin (LIPITOR) 80 MG tablet, TAKE ONE TABLET BY MOUTH DAILY, Disp: 90 tablet, Rfl: 1   Calcium-Magnesium-Vitamin D (CALCIUM 1200+D3 PO), Take 1 tablet by mouth daily., Disp: , Rfl:    clopidogrel (PLAVIX) 75 MG  tablet, Take 1 tablet (75 mg total) by mouth daily., Disp: 90 tablet, Rfl: 1   diazepam (VALIUM) 5 MG tablet, 1 tab po bid as needed for anxiety, Disp: 60 tablet, Rfl: 5   docusate sodium (COLACE) 100 MG capsule, Take 100 mg by mouth daily., Disp: , Rfl:    estradiol (ESTRACE) 0.1 MG/GM vaginal cream, Place 1 Applicatorful vaginally 2 (two) times a week., Disp: , Rfl:    ezetimibe (ZETIA) 10 MG tablet, Take 1 tablet (10 mg total) by mouth daily., Disp: 90 tablet, Rfl: 1   famotidine (PEPCID) 40 MG tablet,  Take 1 tablet (40 mg total) by mouth daily., Disp: 90 tablet, Rfl: 1   fexofenadine (ALLEGRA) 180 MG tablet, Take 180 mg by mouth daily as needed for allergies., Disp: , Rfl:    fluticasone (FLONASE) 50 MCG/ACT nasal spray, Place 2 sprays into both nostrils daily., Disp: 16 g, Rfl: 6   lisinopril (ZESTRIL) 5 MG tablet, Take 1 tablet (5 mg total) by mouth daily., Disp: 90 tablet, Rfl: 1   Magnesium 500 MG CAPS, Take 500 mg by mouth daily., Disp: , Rfl:    metoCLOPramide (REGLAN) 5 MG tablet, Take 1 tablet (5 mg total) by mouth 3 (three) times daily before meals., Disp: 120 tablet, Rfl: 0   nitroGLYCERIN (NITROSTAT) 0.4 MG SL tablet, Place 1 tablet (0.4 mg total) under the tongue every 5 (five) minutes as needed for chest pain., Disp: 25 tablet, Rfl: 6   ondansetron (ZOFRAN-ODT) 4 MG disintegrating tablet, Take 1 tablet (4 mg total) by mouth every 6 (six) hours as needed for nausea or vomiting., Disp: 60 tablet, Rfl: 3   pantoprazole (PROTONIX) 40 MG tablet, Take 1 tablet (40 mg total) by mouth 2 (two) times daily. Take 2 times a day for 4 weeks and then take 1 tablet daily (Patient taking differently: Take 40 mg by mouth daily. Take 2 times a day for 4 weeks and then take 1 tablet daily), Disp: 180 tablet, Rfl: 0   Probiotic Product (PROBIOTIC DAILY PO), Take 1 capsule by mouth daily., Disp: , Rfl:    Prucalopride Succinate (MOTEGRITY) 2 MG TABS, Take 1 tablet (2 mg total) by mouth daily., Disp: 90 tablet, Rfl: 1   Tiotropium Bromide Monohydrate (SPIRIVA RESPIMAT) 1.25 MCG/ACT AERS, Inhale 2 puffs into the lungs daily., Disp: 4 g, Rfl: 3   tolterodine (DETROL LA) 4 MG 24 hr capsule, Take 4 mg by mouth daily., Disp: , Rfl:     Josephine Igo, DO [facility] 04/10/2023 6:14 PM

## 2023-04-11 ENCOUNTER — Other Ambulatory Visit: Payer: PPO

## 2023-04-11 ENCOUNTER — Encounter: Payer: Self-pay | Admitting: Pulmonary Disease

## 2023-04-11 ENCOUNTER — Ambulatory Visit: Payer: PPO | Admitting: Pulmonary Disease

## 2023-04-11 VITALS — BP 100/60 | HR 86 | Ht 64.0 in | Wt 128.0 lb

## 2023-04-11 DIAGNOSIS — Z87891 Personal history of nicotine dependence: Secondary | ICD-10-CM

## 2023-04-11 DIAGNOSIS — K3184 Gastroparesis: Secondary | ICD-10-CM

## 2023-04-11 DIAGNOSIS — J432 Centrilobular emphysema: Secondary | ICD-10-CM | POA: Diagnosis not present

## 2023-04-11 NOTE — Patient Instructions (Signed)
Thank you for visiting Dr. Tonia Brooms at [facility]. Today we recommend the following:  Stay on spiriva   Return in about 1 year (around 04/10/2024) for with Kandice Robinsons, NP, or Dr. Tonia Brooms, after LDCT Chest.    Please do your part to reduce the spread of COVID-19.

## 2023-04-28 ENCOUNTER — Other Ambulatory Visit (INDEPENDENT_AMBULATORY_CARE_PROVIDER_SITE_OTHER): Payer: PPO

## 2023-04-28 DIAGNOSIS — Z23 Encounter for immunization: Secondary | ICD-10-CM | POA: Diagnosis not present

## 2023-04-28 LAB — HEPATIC FUNCTION PANEL
ALT: 64 U/L — ABNORMAL HIGH (ref 0–35)
AST: 51 U/L — ABNORMAL HIGH (ref 0–37)
Albumin: 4.2 g/dL (ref 3.5–5.2)
Alkaline Phosphatase: 88 U/L (ref 39–117)
Bilirubin, Direct: 0.4 mg/dL — ABNORMAL HIGH (ref 0.0–0.3)
Total Bilirubin: 1.9 mg/dL — ABNORMAL HIGH (ref 0.2–1.2)
Total Protein: 6.4 g/dL (ref 6.0–8.3)

## 2023-05-01 ENCOUNTER — Other Ambulatory Visit: Payer: Self-pay | Admitting: Gastroenterology

## 2023-05-09 ENCOUNTER — Ambulatory Visit (INDEPENDENT_AMBULATORY_CARE_PROVIDER_SITE_OTHER): Payer: PPO | Admitting: Gastroenterology

## 2023-05-09 DIAGNOSIS — Z23 Encounter for immunization: Secondary | ICD-10-CM

## 2023-05-09 NOTE — Progress Notes (Signed)
See nursing notes for vaccine administration.

## 2023-05-12 NOTE — Progress Notes (Unsigned)
NEW PATIENT Date of Service/Encounter:  05/15/23 Referring provider: Jeoffrey Massed, MD Primary care provider: Jeoffrey Massed, MD  Subjective:  [name] is a 72 y.o. female with a PMHx of CAD, CVA on Plavix, left carotid stent, Raynaud's, GERD, hyperlipidemia, hypertension, peripheral vascular disease, overweight, hiatal hernia, emphysema, former smoker presenting today for evaluation of possible immune deficiency. History obtained from: chart review and patient.   She is being seen in referral today after an evaluation by her gastroenterology which revealed low IgM antibodies. She does not have a significant infection history. Does not ever remember being hospitalized for an infection.    She does not generally get antibiotics very often, can not remember the last time she was prescribed one. She did have covid twice and was prescribed paxlovid. Last 2 years ago. Did well with the infection.  She has had mildly positive myositis antibodies in the past, but never symptomatic from this. She follows with Dr. Regenia Skeeter in Pulmonary for COPD.  She uses Spiriva daily, and albuterol as needed.  She was told at one point she had ILD but then was told it was secondary to food in her esophagus due to hiatal hernia. She is UTD with her vaccines including pneumonia vaccine  She denies history of eczema or allergic rhinitis. No food allergies. She has had a rash with penicillin. Last given many years ago, over 40 years ago.  She was recently diagnosed with gastroparesis. However this started after her second hiatal hernia repair and thought to be a complication from surgery.   Chart Review:  Reviewed GI telephone result notes from referral 03/29/2023: Patient with elevated liver enzymes with workup showing low IgM at 18 with normal IgA and IgG. Hx of myositis:  ILD (interstitial lung disease) (HCC)       "Detected on lung cancer screening CT 01/2022.  ILD blood panel all normal except  weakly + myositis ab's.  Pt to be evaluated by Dr. Marchelle Gearing asymptomatic as of 03/2022."    Past Medical History: Past Medical History:  Diagnosis Date   Abdominal bruit 04/2017   Aortic u/s showed NO ANEURISM.   Anemia    low iron   Anxiety    Carotid artery occlusion    hx L carotid stent   Cataract    COPD (chronic obstructive pulmonary disease) (HCC)    DOE, worse since 11/2018.  Stress test neg 01/2019.  Albut x 1 inh prn helpful some.  Pulm 06/2019, PFTs not diag of COPD, DOE likely from deconditioning.   Coronary artery disease    Elevated transaminase level    Very mild, 2024.  Liver normal on ultrasound.   Essential hypertension, benign 05/22/2014   Gastroparesis 12/2022   delayed emptying on scan->reglan (Dr. Cliffton Asters)   GERD (gastroesophageal reflux disease)    + hx of esophagitis   Sullivan Lone syndrome    H/O hiatal hernia 02/2019   EGD-->moderate size->surgery recommended but pt declined.   Hematochezia 2020/21   internal hemorrhoids   History of iron deficiency anemia 07/2019   Dr. Barron Alvine EGD and colonoscopy, some anorectal muscosal ulceration, o/w just mild chronic gastritis.   Also could be poor absorption due to chronic high dose acid suppression. Hb/iron normalized aft iron infus and ongoing oral iron.   History of kidney stones    one time   ILD (interstitial lung disease) (HCC)    Detected on lung cancer screening CT 01/2022.  ILD blood panel all normal except weakly +  myositis ab's.  Pt to be evaluated by Dr. Marchelle Gearing asymptomatic as of 03/2022.   Mitral valve prolapse    Mixed hyperlipidemia 05/22/2014   OAB (overactive bladder)    detrol LA from urol helpful   Osteopenia 06/06/2016   05/2016 DEXA T-score -1.5.  06/2018 DEXA T score -1.1.  T score -1.2 06/2021   Peripheral vascular disease, unspecified (HCC) 05/22/2014   innominate and carotid.  U/S f/u 12/2016 showed no signif change compared to 2017--vascular recommended repeat 1 yr.  Doing well as of  08/2018 cardiol f/u-->continued on ASA and Plavix. F/u 08/2019->ASA d/c'd, plavix continued.   Prediabetes 2018   2018 A1c 6.3%.  05/2018 A1c 6.1%. 07/2019 A1c 6.4%.   Raynaud disease    Seasonal allergies    TIA (transient ischemic attack) 2006   Medication List:  Current Outpatient Medications  Medication Sig Dispense Refill   acetaminophen (TYLENOL) 325 MG tablet Take 2 tablets (650 mg total) by mouth every 6 (six) hours as needed for mild pain or moderate pain.     albuterol (VENTOLIN HFA) 108 (90 Base) MCG/ACT inhaler Inhale 2 puffs into the lungs every 6 (six) hours as needed for wheezing or shortness of breath. 8 g 6   atorvastatin (LIPITOR) 80 MG tablet TAKE ONE TABLET BY MOUTH DAILY 90 tablet 1   Calcium-Magnesium-Vitamin D (CALCIUM 1200+D3 PO) Take 1 tablet by mouth daily.     clopidogrel (PLAVIX) 75 MG tablet Take 1 tablet (75 mg total) by mouth daily. 90 tablet 1   diazepam (VALIUM) 5 MG tablet 1 tab po bid as needed for anxiety 60 tablet 5   estradiol (ESTRACE) 0.1 MG/GM vaginal cream Place 1 Applicatorful vaginally 2 (two) times a week.     ezetimibe (ZETIA) 10 MG tablet Take 1 tablet (10 mg total) by mouth daily. 90 tablet 1   famotidine (PEPCID) 40 MG tablet Take 1 tablet (40 mg total) by mouth daily. 90 tablet 1   fexofenadine (ALLEGRA) 180 MG tablet Take 180 mg by mouth daily as needed for allergies.     fluticasone (FLONASE) 50 MCG/ACT nasal spray Place 2 sprays into both nostrils daily. 16 g 6   lisinopril (ZESTRIL) 5 MG tablet Take 1 tablet (5 mg total) by mouth daily. 90 tablet 1   nitroGLYCERIN (NITROSTAT) 0.4 MG SL tablet Place 1 tablet (0.4 mg total) under the tongue every 5 (five) minutes as needed for chest pain. 25 tablet 6   ondansetron (ZOFRAN-ODT) 4 MG disintegrating tablet Take 1 tablet (4 mg total) by mouth every 6 (six) hours as needed for nausea or vomiting. 60 tablet 3   pantoprazole (PROTONIX) 40 MG tablet Take 1 tablet (40 mg total) by mouth daily. 180  tablet 0   Probiotic Product (PROBIOTIC DAILY PO) Take 1 capsule by mouth daily.     Prucalopride Succinate (MOTEGRITY) 2 MG TABS Take 1 tablet (2 mg total) by mouth daily. 90 tablet 1   Tiotropium Bromide Monohydrate (SPIRIVA RESPIMAT) 1.25 MCG/ACT AERS Inhale 2 puffs into the lungs daily. 4 g 3   tolterodine (DETROL LA) 4 MG 24 hr capsule Take 4 mg by mouth daily.     No current facility-administered medications for this visit.   Known Allergies:  Allergies  Allergen Reactions   Doxycycline Shortness Of Breath and Swelling    Lips swelling and short of breath   Demerol Hcl [Meperidine] Nausea And Vomiting   Fentanyl Nausea And Vomiting    Can tolerate with nausea meds  Pt states all pain medications cause N/V for her    Fluocinolone Nausea And Vomiting   Penicillins Hives   Breo Ellipta [Fluticasone Furoate-Vilanterol] Other (See Comments)    Lower extremity weakness, lightheadedness    Imdur [Isosorbide Nitrate] Nausea And Vomiting   Past Surgical History: Past Surgical History:  Procedure Laterality Date   ABDOMINAL HYSTERECTOMY  1997   CARDIAC CATHETERIZATION  01/2012   NORMAL   CARDIOVASCULAR STRESS TEST  02/05/2019   NORMAL.  EF normal.   CAROTID ENDARTERECTOMY  08/21/2007   right - Dr. Madilyn Fireman   CHOLECYSTECTOMY  2000   COLONOSCOPY  approx 2008; 09/23/19   2008 (Dr. Bunnie Pion pt->normal.  08/2019 adenoma, some friable areas of colon + anorectal mucosa inflam w/ ulceration, +diverticulosis. Recall 5 yrs   DEXA  06/06/2016   05/2016 T-score -1.5.  06/2018 T score -1.1.  T score -1.2 06/2021.   ESOPHAGOGASTRODUODENOSCOPY  03/11/2019; 09/23/19   02/2019; 5 cm hiatal hernia; GI suggested surgical repair due to intractable GER.  08/2019 mild chronic gastritis, h pylori neg, multiple benign bx's including duodenum neg for celiac changes.   ESOPHAGOGASTRODUODENOSCOPY N/A 01/18/2021   Procedure: ESOPHAGOGASTRODUODENOSCOPY (EGD);  Surgeon: Corliss Skains, MD;  [facility]  OR;  Service: Thoracic;  Laterality: N/A;   ESOPHAGOGASTRODUODENOSCOPY N/A 05/12/2022   Procedure: ESOPHAGOGASTRODUODENOSCOPY (EGD);  Surgeon: Corliss Skains, MD;  Location: [facility] OR;  Service: Thoracic;  Laterality: N/A;   Incision and drainage of left thenar space abscess  02/23/2015   s/p cat bite (Dr. Amanda Pea)   innominate stent  2007   12/2014 u/s showed patent stent in innominate and L common carotid.  L prox int carotid 40-59% stenosis   LDCT for lung ca screening  06/2019   NEG->rpt 1 yr   Left common carotid stent  2006 and 2007   "                    "                  "                          "                             "   PEXY N/A 05/12/2022   Procedure: GASTROPEXY;  Surgeon: Corliss Skains, MD;  Location: [facility] OR;  Service: Thoracic;  Laterality: N/A;   SINOSCOPY     TRANSTHORACIC ECHOCARDIOGRAM  2007   NORMAL   TUBAL LIGATION     US CAROTID DOPPLER BILATERAL ([facility] HX)  06/2019   left stent w/out stenosis.  R ICA sten 40-50% obst-stable.  07/20/21 R clear, L 40-59%, normal vert and subclav flow.  07/2022 stable   XI ROBOTIC ASSISTED HIATAL HERNIA REPAIR N/A 05/12/2022   Procedure: XI ROBOTIC ASSISTED HIATAL HERNIA REPAIR;  Surgeon: Corliss Skains, MD;  Location: [facility] OR;  Service: Thoracic;  Laterality: N/A;   XI ROBOTIC ASSISTED PARAESOPHAGEAL HERNIA REPAIR N/A 01/18/2021   Procedure: XI ROBOTIC ASSISTED PARAESOPHAGEAL HERNIA REPAIR WITH FUNDOPLICATION WITH MESH;  Surgeon: Corliss Skains, MD;  Location: [facility] OR;  Service: Thoracic;  Laterality: N/A;   Family History: Family History  Problem Relation Age of Onset   Heart attack Mother    CAD Mother    Heart disease Mother        Before  age 28   Hypertension Mother    Hyperlipidemia Mother    Hypertension Father    Pneumonia Father    Hyperlipidemia Father    Heart murmur Sister    Osteoporosis Paternal Grandmother    CAD Paternal Grandfather    Breast cancer Neg Hx    Colon cancer Neg Hx     Esophageal cancer Neg Hx    Rectal cancer Neg Hx    Stomach cancer Neg Hx    Colon polyps Neg Hx    Pancreatic cancer Neg Hx    Social History: Christyn lives in a house built in [place], no water damage, wood floors, gas heating, central AC, 2 dogs, no roaches, using dust mite protection on the bedding but not pillows, no smoke exposure.  She is retired.  No HEPA filter in the home.  [place]   ROS:  All other systems negative except as noted per HPI.  Objective:  Blood pressure 126/70, pulse 89, temperature 97.7 F (36.5 C), resp. rate 16, height 5\' 4"  (1.626 m), weight 129 lb 1.6 oz (58.6 kg), SpO2 95%. Body mass index is 22.16 kg/m. Physical Exam:  General Appearance:  Alert, cooperative, no distress, appears stated age  Head:  Normocephalic, without obvious abnormality, atraumatic  Eyes:  Conjunctiva clear, EOM's intact  Ears EACs normal bilaterally and normal TMs bilaterally  Nose: Nares normal, normal mucosa and no visible anterior polyps  Throat: Lips, tongue normal; teeth and gums normal, normal posterior oropharynx  Neck: Supple, symmetrical  Lungs:   clear to auscultation bilaterally, Respirations unlabored, no coughing  Heart:  regular rate and rhythm and no murmur, Appears well perfused  Extremities: No edema  Skin: Skin color, texture, turgor normal and no rashes or lesions on visualized portions of skin  Neurologic: No gross deficits   Diagnostics:  Labs:  Lab Orders         IgG, IgA, IgM         Strep pneumoniae 23 Serotypes IgG         Diphtheria / Tetanus Antibody Panel       Assessment and Plan  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  Low suspicion for clinically significant immune deficiency.  -A great majority of patients with this are asymptomatic, some have an increased risk for autoimmunity or allergic diseases as well as infections.  This does not seem  to be the case for you. - Will repeat levels today to see if yours remain low - We will also obtain vaccine titer responses to diphtheria, tetanus and strep pneumonia. -History reassuring.  We discussed that should she have low IgM on repeat labs, there would be no therapeutic intervention recommended at this time unless her vaccine titers are low.  If so, would recommend vaccination booster and repeat titers in 6 weeks. Unfortunately there is no replacement for IgM specifically. In some cases, IVIG has been use when recurrent infections and low vaccine titers are present.  Clinically she does not meet criteria for this based on infection history.  She was provided a handout from the immune deficiency.org regarding selective IgM antibody deficiency. If her follow-up blood work is reassuring, she will plan to follow-up as needed should anything change.  Follow up : pending results It was a pleasure meeting you in clinic today! Thank you for allowing me to participate in your care.  Tonny Bollman, MD [facility]  This note in its entirety was forwarded to the Provider who requested this consultation.  Other: none  Thank you for your kind referral. I appreciate the opportunity to take part in Jaliah's care. Please do not hesitate to contact me with questions.  Sincerely,  Tonny Bollman, MD [facility]

## 2023-05-15 ENCOUNTER — Ambulatory Visit (INDEPENDENT_AMBULATORY_CARE_PROVIDER_SITE_OTHER): Payer: PPO | Admitting: Internal Medicine

## 2023-05-15 ENCOUNTER — Other Ambulatory Visit: Payer: Self-pay

## 2023-05-15 ENCOUNTER — Encounter: Payer: Self-pay | Admitting: Internal Medicine

## 2023-05-15 ENCOUNTER — Other Ambulatory Visit: Payer: Self-pay | Admitting: Gastroenterology

## 2023-05-15 VITALS — BP 126/70 | HR 89 | Temp 97.7°F | Resp 16 | Ht 64.0 in | Wt 129.1 lb

## 2023-05-15 DIAGNOSIS — R768 Other specified abnormal immunological findings in serum: Secondary | ICD-10-CM | POA: Diagnosis not present

## 2023-05-15 DIAGNOSIS — D804 Selective deficiency of immunoglobulin M [IgM]: Secondary | ICD-10-CM | POA: Diagnosis not present

## 2023-05-15 NOTE — Patient Instructions (Addendum)
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  This does not seem to be the case for you. - Will repeat levels today to see if yours remain low - We will also obtain vaccine titer responses to diphtheria, tetanus and strep pneumonia. -History reassuring.  Follow up : pending results It was a pleasure meeting you in clinic today! Thank you for allowing me to participate in your care.  Tonny Bollman, MD [facility]

## 2023-05-19 LAB — STREP PNEUMONIAE 23 SEROTYPES IGG
Pneumo Ab Type 1*: 0.5 ug/mL — ABNORMAL LOW (ref >1.3)
Pneumo Ab Type 12 (12F)*: 0.2 ug/mL — ABNORMAL LOW (ref >1.3)
Pneumo Ab Type 14*: 0.6 ug/mL — ABNORMAL LOW (ref >1.3)
Pneumo Ab Type 17 (17F)*: 7.5 ug/mL (ref >1.3)
Pneumo Ab Type 19 (19F)*: 6.1 ug/mL (ref >1.3)
Pneumo Ab Type 2*: 0.6 ug/mL — ABNORMAL LOW (ref >1.3)
Pneumo Ab Type 20*: 2.2 ug/mL (ref >1.3)
Pneumo Ab Type 22 (22F)*: 1.1 ug/mL — ABNORMAL LOW (ref >1.3)
Pneumo Ab Type 23 (23F)*: 6.7 ug/mL (ref >1.3)
Pneumo Ab Type 26 (6B)*: 3.4 ug/mL (ref >1.3)
Pneumo Ab Type 3*: 0.7 ug/mL — ABNORMAL LOW (ref >1.3)
Pneumo Ab Type 34 (10A)*: 2.2 ug/mL (ref >1.3)
Pneumo Ab Type 4*: 2.1 ug/mL (ref >1.3)
Pneumo Ab Type 43 (11A)*: 2.2 ug/mL (ref >1.3)
Pneumo Ab Type 5*: 0.5 ug/mL — ABNORMAL LOW (ref >1.3)
Pneumo Ab Type 51 (7F)*: 3.3 ug/mL (ref >1.3)
Pneumo Ab Type 54 (15B)*: 0.9 ug/mL — ABNORMAL LOW (ref >1.3)
Pneumo Ab Type 56 (18C)*: 1.8 ug/mL (ref >1.3)
Pneumo Ab Type 57 (19A)*: 21 ug/mL (ref >1.3)
Pneumo Ab Type 68 (9V)*: 4.2 ug/mL (ref >1.3)
Pneumo Ab Type 70 (33F)*: 3.4 ug/mL (ref >1.3)
Pneumo Ab Type 8*: 21.9 ug/mL (ref >1.3)
Pneumo Ab Type 9 (9N)*: 6.3 ug/mL (ref >1.3)

## 2023-05-19 LAB — DIPHTHERIA / TETANUS ANTIBODY PANEL
Diphtheria Ab: 0.29 IU/mL (ref <0.10)
Tetanus Ab, IgG: 2.27 [IU]/mL (ref <0.10)

## 2023-05-19 LAB — IGG, IGA, IGM
IgA/Immunoglobulin A, Serum: 217 mg/dL (ref 64–422)
IgG (Immunoglobin G), Serum: 667 mg/dL (ref 586–1602)
IgM (Immunoglobulin M), Srm: 15 mg/dL — ABNORMAL LOW (ref 26–217)

## 2023-06-02 ENCOUNTER — Encounter: Payer: Self-pay | Admitting: Interventional Cardiology

## 2023-06-20 DIAGNOSIS — K5909 Other constipation: Secondary | ICD-10-CM | POA: Diagnosis not present

## 2023-06-20 DIAGNOSIS — K3184 Gastroparesis: Secondary | ICD-10-CM | POA: Diagnosis not present

## 2023-06-20 DIAGNOSIS — Z9889 Other specified postprocedural states: Secondary | ICD-10-CM | POA: Diagnosis not present

## 2023-06-20 DIAGNOSIS — Z8719 Personal history of other diseases of the digestive system: Secondary | ICD-10-CM | POA: Diagnosis not present

## 2023-06-27 ENCOUNTER — Encounter: Payer: Self-pay | Admitting: Family Medicine

## 2023-06-28 ENCOUNTER — Ambulatory Visit: Payer: PPO | Admitting: Gastroenterology

## 2023-06-28 NOTE — Telephone Encounter (Signed)
Yes, gabapentin is fine. Do you have some?

## 2023-06-30 ENCOUNTER — Encounter: Payer: Self-pay | Admitting: Family Medicine

## 2023-07-12 ENCOUNTER — Other Ambulatory Visit: Payer: Self-pay | Admitting: Family Medicine

## 2023-07-17 ENCOUNTER — Encounter: Payer: Self-pay | Admitting: Physician Assistant

## 2023-07-17 DIAGNOSIS — I2510 Atherosclerotic heart disease of native coronary artery without angina pectoris: Secondary | ICD-10-CM

## 2023-07-17 HISTORY — DX: Atherosclerotic heart disease of native coronary artery without angina pectoris: I25.10

## 2023-07-17 NOTE — Progress Notes (Unsigned)
Cardiology Office Note:    Date:  07/18/2023  ID:  [name], DOB 04-05-1951, MRN [id number] PCP: Jeoffrey Massed, MD  [facility] Providers Cardiologist:  Lance Muss, MD       Patient Profile:      Coronary artery disease  Cardiac catheterization 02/08/12: no CAD  Myoview 02/05/2019: EF 56, no ischemia, low risk CCTA 12/21/20: CAC score 907 (97th percentile); LM 0-24, LAD prox 25-49, RCA 50-69; FFR normal Peripheral arterial disease Raynaud's disease  Carotid artery disease S/p L  common carotid artery stent 2006, R innominate artery stent 2007 S/p R CEA in 07/2007 [place] 07/25/22: R CEA patent; LICA 40-59 Hx of TIA Hypertension  Hyperlipidemia  Aortic atherosclerosis Chronic Obstructive Pulmonary Disease  ILD (Interstitial Lung Disease)           History of Present Illness:  Discussed the use of AI scribe software for clinical note transcription with the patient, who gave verbal consent to proceed.  [name] is a 72 y.o. female who returns for follow up of CAD. She was last seen by Dr. Eldridge Dace in 06/2022.   She is here alone. The patient has been experiencing interscapular back pain, which has been attributed to degenerative disc issues. The pain is particularly noticeable after standing for about an hour. However, the pain does not worsen with treadmill exercise. She does not experience shortness of breath or chest pain when walking on the treadmill. She has not had syncope, orthopnea, leg edema. She has requested to follow up with Dr. Clifton James given Dr. Hoyle Barr departure.     ROS   See HPI     Studies Reviewed:   EKG Interpretation Date/Time:  Tuesday July 18 2023 09:19:46 EDT Ventricular Rate:  80 PR Interval:  130 QRS Duration:  64 QT Interval:  376 QTC Calculation: 433 R Axis:   97  Text Interpretation: Normal sinus rhythm Rightward axis Low voltage QRS No significant change since last tracing Confirmed by Tereso Newcomer [id number]) on  07/18/2023 9:52:33 AM    Results   LABS  - Chart Review  Potassium: 5.2 (02/01/2023) Creatinine: 0.76 (02/01/2023) ALT: 64 (04/28/2023) LDL: 38 (02/01/2023) Total cholesterol: 102 (02/01/2023) HDL: 45.8 (02/01/2023) Triglycerides: 92 (02/01/2023)      Risk Assessment/Calculations:             Physical Exam:   VS:  BP 110/62   Pulse 80   Ht 5' 4.5" (1.638 m)   Wt 126 lb (57.2 kg)   SpO2 96%   BMI 21.29 kg/m    Wt Readings from Last 3 Encounters:  07/18/23 126 lb (57.2 kg)  05/15/23 129 lb 1.6 oz (58.6 kg)  04/11/23 128 lb (58.1 kg)    Constitutional:      Appearance: Healthy appearance. Not in distress.  Neck:     Vascular: JVD normal.  Pulmonary:     Breath sounds: Normal breath sounds. No wheezing. No rales.  Cardiovascular:     Normal rate. Regular rhythm.     Murmurs: There is no murmur.  Edema:    Peripheral edema absent.  Abdominal:     Palpations: Abdomen is soft.        Assessment and Plan:   Assessment & Plan Coronary artery disease involving native coronary artery of native heart without angina pectoris CCTA in 11/2020 with elevated calcium score and mild to moderate non-obstructive CAD by FFR. No chest discomfort suggestive of angina. Interscapular back pain appears musculoskeletal. -Continue Plavix 75mg  daily,  Lipitor 80mg  daily, and Zetia 10mg  daily. Peripheral vascular disease (HCC) History of left common carotid artery stent, right innominate artery stent, and right CEA. Annual surveillance with vascular surgery. -No changes to current management. Essential hypertension, benign Blood pressure controlled. -Continue Lisinopril 5mg  daily.  Mixed hyperlipidemia Recent elevation in LFTs, followed by gastroenterology. LDL in May 2024 was optimal. ALT in 04/2023 was only mildly elevated.  -Continue Lipitor 80mg  daily and Zetia 10mg  daily.       Dispo:  Return in about 1 year (around 07/17/2024) for Routine Follow Up, w/ Dr.  Clifton James.  Signed, Tereso Newcomer, PA-C

## 2023-07-18 ENCOUNTER — Ambulatory Visit: Payer: PPO | Attending: Physician Assistant | Admitting: Physician Assistant

## 2023-07-18 ENCOUNTER — Encounter: Payer: Self-pay | Admitting: Physician Assistant

## 2023-07-18 VITALS — BP 110/62 | HR 80 | Ht 64.5 in | Wt 126.0 lb

## 2023-07-18 DIAGNOSIS — I10 Essential (primary) hypertension: Secondary | ICD-10-CM | POA: Diagnosis not present

## 2023-07-18 DIAGNOSIS — E782 Mixed hyperlipidemia: Secondary | ICD-10-CM | POA: Diagnosis not present

## 2023-07-18 DIAGNOSIS — I739 Peripheral vascular disease, unspecified: Secondary | ICD-10-CM

## 2023-07-18 DIAGNOSIS — I2510 Atherosclerotic heart disease of native coronary artery without angina pectoris: Secondary | ICD-10-CM

## 2023-07-18 NOTE — Assessment & Plan Note (Signed)
History of left common carotid artery stent, right innominate artery stent, and right CEA. Annual surveillance with vascular surgery. -No changes to current management.

## 2023-07-18 NOTE — Assessment & Plan Note (Signed)
Recent elevation in LFTs, followed by gastroenterology. LDL in May 2024 was optimal. ALT in 04/2023 was only mildly elevated.  -Continue Lipitor 80mg  daily and Zetia 10mg  daily.

## 2023-07-18 NOTE — Patient Instructions (Signed)
Medication Instructions:  Your physician recommends that you continue on your current medications as directed. Please refer to the Current Medication list given to you today.  *If you need a refill on your cardiac medications before your next appointment, please call your pharmacy*   Lab Work: None ordered  If you have labs (blood work) drawn today and your tests are completely normal, you will receive your results only by: MyChart Message (if you have MyChart) OR A paper copy in the mail If you have any lab test that is abnormal or we need to change your treatment, we will call you to review the results.   Testing/Procedures: None ordered   Follow-Up: At [facility], you and your health needs are our priority.  As part of our continuing mission to provide you with exceptional heart care, we have created designated Provider Care Teams.  These Care Teams include your primary Cardiologist (physician) and Advanced Practice Providers (APPs -  Physician Assistants and Nurse Practitioners) who all work together to provide you with the care you need, when you need it.  We recommend signing up for the patient portal called "MyChart".  Sign up information is provided on this After Visit Summary.  MyChart is used to connect with patients for Virtual Visits (Telemedicine).  Patients are able to view lab/test results, encounter notes, upcoming appointments, etc.  Non-urgent messages can be sent to your provider as well.   To learn more about what you can do with MyChart, go to [email or web address].    Your next appointment:   1 year(s)  Provider:   Jayadeep Varanasi, MD     Other Instructions   

## 2023-07-18 NOTE — Assessment & Plan Note (Signed)
Blood pressure controlled. -Continue Lisinopril 5mg  daily.

## 2023-07-18 NOTE — Assessment & Plan Note (Signed)
CCTA in 11/2020 with elevated calcium score and mild to moderate non-obstructive CAD by FFR. No chest discomfort suggestive of angina. Interscapular back pain appears musculoskeletal. -Continue Plavix 75mg  daily, Lipitor 80mg  daily, and Zetia 10mg  daily.

## 2023-07-21 ENCOUNTER — Encounter: Payer: Self-pay | Admitting: Gastroenterology

## 2023-07-21 DIAGNOSIS — H43392 Other vitreous opacities, left eye: Secondary | ICD-10-CM | POA: Diagnosis not present

## 2023-07-21 DIAGNOSIS — H538 Other visual disturbances: Secondary | ICD-10-CM | POA: Diagnosis not present

## 2023-07-21 DIAGNOSIS — H04123 Dry eye syndrome of bilateral lacrimal glands: Secondary | ICD-10-CM | POA: Diagnosis not present

## 2023-07-24 NOTE — Telephone Encounter (Signed)
I reviewed the consult note by Dr. Shawn Stall. Looks like the plan is to start with repeat EGD with Endoflip of the pylorus. This is a diagnostic study to measure the pressure at the outlet of the stomach. The possibility of then cutting a muscle would be cutting that muscle the leads from the stomach to the small intestine, not at the esophagus or GEJ again. This procedure would be with the goal of facilitating emptying of the stomach and tryign to relieve the gastroparesis.   I recommend reaching out to Dr. Shawn Stall to see if the upcoming procedure is diagnostic only, or if there is a plan for therapeutic (intervention) that same day depending on the findings. Since these are not procedures that I do, I would defer to Dr. Shawn Stall, who is more the subject matter expert in this arena.

## 2023-07-26 ENCOUNTER — Other Ambulatory Visit: Payer: Self-pay | Admitting: Family Medicine

## 2023-07-26 DIAGNOSIS — Z1231 Encounter for screening mammogram for malignant neoplasm of breast: Secondary | ICD-10-CM

## 2023-08-01 ENCOUNTER — Encounter: Payer: Self-pay | Admitting: Family Medicine

## 2023-08-01 DIAGNOSIS — H04123 Dry eye syndrome of bilateral lacrimal glands: Secondary | ICD-10-CM | POA: Diagnosis not present

## 2023-08-01 DIAGNOSIS — H538 Other visual disturbances: Secondary | ICD-10-CM | POA: Diagnosis not present

## 2023-08-01 MED ORDER — ATORVASTATIN CALCIUM 80 MG PO TABS: 80.0000 mg | ORAL_TABLET | Freq: Every day | ORAL | 1 refills | Status: DC

## 2023-08-01 MED ORDER — DIAZEPAM 5 MG PO TABS: ORAL_TABLET | ORAL | 5 refills | Status: DC

## 2023-08-01 MED ORDER — EZETIMIBE 10 MG PO TABS: 10.0000 mg | ORAL_TABLET | Freq: Every day | ORAL | 1 refills | Status: DC

## 2023-08-01 NOTE — Telephone Encounter (Signed)
RF request for atorvastatin (LIPITOR) 80 MG tablet  LOV: 03/03/23 Next ov: 08/09/23 Last written: 01/11/23 (90,1)  RF request for ezetimibe (ZETIA) 10 MG tablet  LOV: 03/03/23 Next ov: 08/09/23 Last written: 01/11/23 (90,1)  Requesting: diazepam (VALIUM) 5 MG tablet  Contract: 08/03/22 UDS: 08/03/22 Last Visit: 03/03/23 Next Visit: 08/09/23 Last Refill: 02/01/23 (60,5)  Please Advise. Meds pending

## 2023-08-04 ENCOUNTER — Encounter: Payer: PPO | Admitting: Family Medicine

## 2023-08-04 ENCOUNTER — Other Ambulatory Visit: Payer: Self-pay

## 2023-08-04 DIAGNOSIS — I739 Peripheral vascular disease, unspecified: Secondary | ICD-10-CM

## 2023-08-04 DIAGNOSIS — I6523 Occlusion and stenosis of bilateral carotid arteries: Secondary | ICD-10-CM

## 2023-08-07 NOTE — Patient Instructions (Signed)
   It was very nice to see you today!     Please try these tips to maintain a healthy lifestyle:  Eat most of your calories during the day when you are active. Eliminate processed foods including packaged sweets (pies, cakes, cookies), reduce intake of potatoes, white bread, white pasta, and white rice. Look for whole grain options, oat flour or almond flour.  Each meal should contain half fruits/vegetables, one quarter protein, and one quarter carbs (no bigger than a computer mouse).  Cut down on sweet beverages. This includes juice, soda, and sweet tea. Also watch fruit intake, though this is a healthier sweet option, it still contains natural sugar! Limit to 3 servings daily.  Drink at least 1 glass of water with each meal and aim for at least 8 glasses per day  Exercise at least 150 minutes every week.

## 2023-08-09 ENCOUNTER — Ambulatory Visit (INDEPENDENT_AMBULATORY_CARE_PROVIDER_SITE_OTHER): Payer: PPO | Admitting: Family Medicine

## 2023-08-09 ENCOUNTER — Other Ambulatory Visit: Payer: Self-pay | Admitting: Family Medicine

## 2023-08-09 ENCOUNTER — Encounter: Payer: Self-pay | Admitting: Family Medicine

## 2023-08-09 VITALS — BP 105/69 | HR 74 | Ht 64.5 in | Wt 126.8 lb

## 2023-08-09 DIAGNOSIS — Z0000 Encounter for general adult medical examination without abnormal findings: Secondary | ICD-10-CM | POA: Diagnosis not present

## 2023-08-09 DIAGNOSIS — R251 Tremor, unspecified: Secondary | ICD-10-CM

## 2023-08-09 DIAGNOSIS — K3184 Gastroparesis: Secondary | ICD-10-CM | POA: Diagnosis not present

## 2023-08-09 DIAGNOSIS — E7800 Pure hypercholesterolemia, unspecified: Secondary | ICD-10-CM | POA: Diagnosis not present

## 2023-08-09 DIAGNOSIS — R7303 Prediabetes: Secondary | ICD-10-CM

## 2023-08-09 DIAGNOSIS — I10 Essential (primary) hypertension: Secondary | ICD-10-CM

## 2023-08-09 DIAGNOSIS — Z79899 Other long term (current) drug therapy: Secondary | ICD-10-CM

## 2023-08-09 DIAGNOSIS — F411 Generalized anxiety disorder: Secondary | ICD-10-CM

## 2023-08-09 DIAGNOSIS — M70821 Other soft tissue disorders related to use, overuse and pressure, right upper arm: Secondary | ICD-10-CM | POA: Diagnosis not present

## 2023-08-09 LAB — COMPREHENSIVE METABOLIC PANEL
ALT: 34 U/L (ref 0–35)
AST: 31 U/L (ref 0–37)
Albumin: 4.3 g/dL (ref 3.5–5.2)
Alkaline Phosphatase: 100 U/L (ref 39–117)
BUN: 10 mg/dL (ref 6–23)
CO2: 30 meq/L (ref 19–32)
Calcium: 10.5 mg/dL (ref 8.4–10.5)
Chloride: 106 meq/L (ref 96–112)
Creatinine, Ser: 0.83 mg/dL (ref 0.40–1.20)
GFR: 70.51 mL/min (ref >60.00)
Glucose, Bld: 98 mg/dL (ref 70–99)
Potassium: 5.2 meq/L — ABNORMAL HIGH (ref 3.5–5.1)
Sodium: 143 meq/L (ref 135–145)
Total Bilirubin: 1.8 mg/dL — ABNORMAL HIGH (ref 0.2–1.2)
Total Protein: 6.5 g/dL (ref 6.0–8.3)

## 2023-08-09 LAB — LIPID PANEL
Cholesterol: 114 mg/dL (ref 0–200)
HDL: 45.6 mg/dL (ref >39.00)
LDL Cholesterol: 51 mg/dL (ref 0–99)
NonHDL: 68.48
Total CHOL/HDL Ratio: 3
Triglycerides: 87 mg/dL (ref 0.0–149.0)
VLDL: 17.4 mg/dL (ref 0.0–40.0)

## 2023-08-09 LAB — CBC WITH DIFFERENTIAL/PLATELET
Basophils Absolute: 0 10*3/uL (ref 0.0–0.1)
Basophils Relative: 0.7 % (ref 0.0–3.0)
Eosinophils Absolute: 0.2 10*3/uL (ref 0.0–0.7)
Eosinophils Relative: 3.1 % (ref 0.0–5.0)
HCT: 43.8 % (ref 36.0–46.0)
Hemoglobin: 14.4 g/dL (ref 12.0–15.0)
Lymphocytes Relative: 36.5 % (ref 12.0–46.0)
Lymphs Abs: 2.1 10*3/uL (ref 0.7–4.0)
MCHC: 32.8 g/dL (ref 30.0–36.0)
MCV: 97.4 fL (ref 78.0–100.0)
Monocytes Absolute: 0.6 10*3/uL (ref 0.1–1.0)
Monocytes Relative: 9.5 % (ref 3.0–12.0)
Neutro Abs: 2.9 10*3/uL (ref 1.4–7.7)
Neutrophils Relative %: 50.2 % (ref 43.0–77.0)
Platelets: 256 10*3/uL (ref 150.0–400.0)
RBC: 4.5 Mil/uL (ref 3.87–5.11)
RDW: 14 % (ref 11.5–15.5)
WBC: 5.9 10*3/uL (ref 4.0–10.5)

## 2023-08-09 LAB — HEMOGLOBIN A1C: Hgb A1c MFr Bld: 6 % (ref 4.6–6.5)

## 2023-08-09 MED ORDER — FAMOTIDINE 40 MG PO TABS: 40.0000 mg | ORAL_TABLET | Freq: Every day | ORAL | 1 refills | Status: DC

## 2023-08-09 MED ORDER — LISINOPRIL 5 MG PO TABS: 5.0000 mg | ORAL_TABLET | Freq: Every day | ORAL | 1 refills | Status: DC

## 2023-08-09 MED ORDER — CLOPIDOGREL BISULFATE 75 MG PO TABS: 75.0000 mg | ORAL_TABLET | Freq: Every day | ORAL | 1 refills | Status: DC

## 2023-08-09 NOTE — Progress Notes (Signed)
Office Note 08/09/2023  CC:  Chief Complaint  Patient presents with   Annual Exam    Pt is fasting.    Patient is a 72 y.o. [name] who is here for annual health maintenance exam and 59-month follow-up hypertension, hyperlipidemia, and anxiety. A/P as of last visit: "1 hypertension, well controlled on lisinopril 5 mg a day. Electrolytes and creatinine today.   2.  Hypercholesterolemia.  Doing well on atorvastatin 80 mg a day and Zetia 10 mg a day. Lipid panel today.   #3 GAD with anxiety-related insomnia.  She had been doing well long-term on Valium 2 mg twice daily but over the last 6 months has noted she has needed this medication more regularly. We will increase to Valium 5 mg twice daily as needed, #60, refill x 5. Controlled substance contract up-to-date.   4 prediabetes. Excellent improvement in A1c over the last 6 months. Point-of-care hemoglobin A1c today is 5.4%.   #5 gastroparesis. Just started Reglan--no improvement in upper GI symptoms yet. She has been referred to Dr. Lavon Paganini, GI. Riverlynn will call if she needs any assistance."  INTERIM HX: Still dealing with chronic GI upset due to gastroparesis. Gastroenterologist has put her on Motegrity (rx'd 03/16/23). She says it is difficult to tell whether it is helping or not. She has another upper GI endoscopic procedure coming up. She has lots of trouble with adjusting her diet to try to minimize symptoms. She requests a dietitian referral to see if they can help with this.  She has noted some right arm pain and tremor for the last few weeks.  She does live alone and moves heavy objects a lot at home and it is not unusual for her to strain something.  She feels the pain starting in the lateral aspect of the neck on the right and extends down some over the top of the shoulder and into the upper arm.  Feels like an ache, nothing sharp.  No paresthesias or weakness.  She does note that she has had a tremor in the right arm  that is more noticeable when she tries to pick up the pot of coffee.  It trembles enough that makes it difficult to pour correctly.  She does not feel any significant tremor with the left arm. Denies any known family history of tremor. Denies any imbalance lately, no shuffling gait.  Diazepam 5 mg twice a day still helping well with anxiety. PMP AWARE reviewed today: most recent rx for diazepam 5 mg was filled 07/12/2023, # 60, rx by me. No red flags.   Past Medical History:  Diagnosis Date   Abdominal bruit 04/2017   Aortic u/s showed NO ANEURISM.   Anemia    low iron   Anxiety    CAD (coronary artery disease) 07/17/2023   -Cardiac catheterization 02/08/12: no CAD  -Myoview 02/05/2019: EF 56, no ischemia, low risk -CCTA 12/21/20: CAC score 907 (97th percentile); LM 0-24, LAD prox 25-49, RCA 50-69; FFR normal    Carotid artery occlusion    hx L carotid stent   Cataract    COPD (chronic obstructive pulmonary disease) (HCC)    DOE, worse since 11/2018.  Stress test neg 01/2019.  Albut x 1 inh prn helpful some.  Pulm 06/2019, PFTs not diag of COPD, DOE likely from deconditioning.   Coronary artery disease    Elevated transaminase level    Very mild, 2024.  Liver normal on ultrasound.   Essential hypertension, benign 05/22/2014   Gastroparesis 12/2022  delayed emptying on scan->reglan (Dr. Cliffton Asters)   GERD (gastroesophageal reflux disease)    + hx of esophagitis   Sullivan Lone syndrome    H/O hiatal hernia 02/2019   EGD-->moderate size->surgery recommended but pt declined.   Hematochezia 2020/21   internal hemorrhoids   History of iron deficiency anemia 07/2019   Dr. Barron Alvine EGD and colonoscopy, some anorectal muscosal ulceration, o/w just mild chronic gastritis.   Also could be poor absorption due to chronic high dose acid suppression. Hb/iron normalized aft iron infus and ongoing oral iron.   History of kidney stones    one time   ILD (interstitial lung disease) (HCC)     Detected on lung cancer screening CT 01/2022.  ILD blood panel all normal except weakly + myositis ab's.  Pt to be evaluated by Dr. Marchelle Gearing asymptomatic as of 03/2022.   Mitral valve prolapse    Mixed hyperlipidemia 05/22/2014   OAB (overactive bladder)    detrol LA from urol helpful   Osteopenia 06/06/2016   05/2016 DEXA T-score -1.5.  06/2018 DEXA T score -1.1.  T score -1.2 06/2021   Peripheral vascular disease, unspecified (HCC) 05/22/2014   innominate and carotid.  U/S f/u 12/2016 showed no signif change compared to 2017--vascular recommended repeat 1 yr.  Doing well as of 08/2018 cardiol f/u-->continued on ASA and Plavix. F/u 08/2019->ASA d/c'd, plavix continued.   Prediabetes 2018   2018 A1c 6.3%.  05/2018 A1c 6.1%. 07/2019 A1c 6.4%.   Raynaud disease    Seasonal allergies    TIA (transient ischemic attack) 2006    Past Surgical History:  Procedure Laterality Date   ABDOMINAL HYSTERECTOMY  1997   CARDIAC CATHETERIZATION  01/2012   NORMAL   CARDIOVASCULAR STRESS TEST  02/05/2019   NORMAL.  EF normal.   CAROTID ENDARTERECTOMY  08/21/2007   right - Dr. Madilyn Fireman   CHOLECYSTECTOMY  2000   COLONOSCOPY  approx 2008; 09/23/19   2008 (Dr. Bunnie Pion pt->normal.  08/2019 adenoma, some friable areas of colon + anorectal mucosa inflam w/ ulceration, +diverticulosis. Recall 5 yrs   DEXA  06/06/2016   05/2016 T-score -1.5.  06/2018 T score -1.1.  T score -1.2 06/2021.   ESOPHAGOGASTRODUODENOSCOPY  03/11/2019; 09/23/19   02/2019; 5 cm hiatal hernia; GI suggested surgical repair due to intractable GER.  08/2019 mild chronic gastritis, h pylori neg, multiple benign bx's including duodenum neg for celiac changes.   ESOPHAGOGASTRODUODENOSCOPY N/A 01/18/2021   Procedure: ESOPHAGOGASTRODUODENOSCOPY (EGD);  Surgeon: Corliss Skains, MD;  Location: [facility] OR;  Service: Thoracic;  Laterality: N/A;   ESOPHAGOGASTRODUODENOSCOPY N/A 05/12/2022   Procedure: ESOPHAGOGASTRODUODENOSCOPY (EGD);  Surgeon:  Corliss Skains, MD;  Location: [facility] OR;  Service: Thoracic;  Laterality: N/A;   Incision and drainage of left thenar space abscess  02/23/2015   s/p cat bite (Dr. Amanda Pea)   innominate stent  2007   12/2014 u/s showed patent stent in innominate and L common carotid.  L prox int carotid 40-59% stenosis   LDCT for lung ca screening  06/2019   NEG->rpt 1 yr   Left common carotid stent  2006 and 2007   "                    "                  "                          "                             "  PEXY N/A 05/12/2022   Procedure: GASTROPEXY;  Surgeon: Corliss Skains, MD;  Location: [facility] OR;  Service: Thoracic;  Laterality: N/A;   SINOSCOPY     TRANSTHORACIC ECHOCARDIOGRAM  14-Sep-2006   NORMAL   TUBAL LIGATION     US CAROTID DOPPLER BILATERAL ([facility] HX)  06/2019   left stent w/out stenosis.  R ICA sten 40-50% obst-stable.  07/20/21 R clear, L 40-59%, normal vert and subclav flow.  09-14-22 stable   XI ROBOTIC ASSISTED HIATAL HERNIA REPAIR N/A 05/12/2022   Procedure: XI ROBOTIC ASSISTED HIATAL HERNIA REPAIR;  Surgeon: Corliss Skains, MD;  Location: [facility] OR;  Service: Thoracic;  Laterality: N/A;   XI ROBOTIC ASSISTED PARAESOPHAGEAL HERNIA REPAIR N/A 01/18/2021   Procedure: XI ROBOTIC ASSISTED PARAESOPHAGEAL HERNIA REPAIR WITH FUNDOPLICATION WITH MESH;  Surgeon: Corliss Skains, MD;  Location: [facility] OR;  Service: Thoracic;  Laterality: N/A;    Family History  Problem Relation Age of Onset   Heart attack Mother    CAD Mother    Heart disease Mother        Before age 64   Hypertension Mother    Hyperlipidemia Mother    Hypertension Father    Pneumonia Father    Hyperlipidemia Father    Heart murmur Sister    Osteoporosis Paternal Grandmother    CAD Paternal Grandfather    Breast cancer Neg Hx    Colon cancer Neg Hx    Esophageal cancer Neg Hx    Rectal cancer Neg Hx    Stomach cancer Neg Hx    Colon polyps Neg Hx    Pancreatic cancer Neg Hx     Social History    Socioeconomic History   Marital status: Widowed    Spouse name: Not on file   Number of children: 1   Years of education: Not on file   Highest education level: 12th grade  Occupational History   Not on file  Tobacco Use   Smoking status: Former    Current packs/day: 0.00    Average packs/day: 1.3 packs/day for 38.0 years (47.5 ttl pk-yrs)    Types: Cigarettes    Start date: 09/26/1969    Quit date: 09/27/2007    Years since quitting: 15.8    Passive exposure: Past   Smokeless tobacco: Never  Vaping Use   Vaping status: Never Used  Substance and Sexual Activity   Alcohol use: No   Drug use: No   Sexual activity: Not on file  Other Topics Concern   Not on file  Social History Narrative   Widowed as of 09/15/15 (husband died of viral illness while out of town in [place], sudden), has one daughter.   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   Tobacco: 20 pack-yr hx, quit 14-Sep-2008.   Alcohol: rare.   No hx of alc or drug problems.   Exercise: walks 2.79miles on treadmill daily.   Social Determinants of Health   Financial Resource Strain: Low Risk  (02/27/2023)   Overall Financial Resource Strain (CARDIA)    Difficulty of Paying Living Expenses: Not hard at all  Food Insecurity: Low Risk  (06/20/2023)   Received from [facility]   Hunger Vital Sign    Worried About Running Out of Food in the Last Year: Never true    Ran Out of Food in the Last Year: Never true  Transportation Needs: No Transportation Needs (06/20/2023)   [facility]  Transportation    In the past 12 months, has lack of reliable transportation kept you from medical appointments, meetings, work or from getting things needed for daily living? : No  Physical Activity: Sufficiently Active (02/27/2023)   Exercise Vital Sign    Days of Exercise per Week: 7 days    Minutes of Exercise per Session: 50 min  Stress: No Stress Concern Present (02/27/2023)   [employer] of Occupational Health - Occupational Stress Questionnaire    Feeling of Stress : Not at all  Social Connections: Moderately Isolated (02/27/2023)   Social Connection and Isolation Panel [NHANES]    Frequency of Communication with Friends and Family: More than three times a week    Frequency of Social Gatherings with Friends and Family: Twice a week    Attends Religious Services: 1 to 4 times per year    Active Member of [employer] or Organizations: No    Attends Banker Meetings: Not on file    Marital Status: Widowed  Intimate Partner Violence: Not At Risk (10/12/2022)   Humiliation, Afraid, Rape, and Kick questionnaire    Fear of Current or Ex-Partner: No    Emotionally Abused: No    Physically Abused: No    Sexually Abused: No    Outpatient Medications Prior to Visit  Medication Sig Dispense Refill   acetaminophen (TYLENOL) 325 MG tablet Take 2 tablets (650 mg total) by mouth every 6 (six) hours as needed for mild pain or moderate pain.     albuterol (VENTOLIN HFA) 108 (90 Base) MCG/ACT inhaler Inhale 2 puffs into the lungs every 6 (six) hours as needed for wheezing or shortness of breath. 8 g 6   atorvastatin (LIPITOR) 80 MG tablet Take 1 tablet (80 mg total) by mouth daily. 90 tablet 1   Calcium-Magnesium-Vitamin D (CALCIUM 1200+D3 PO) Take 1 tablet by mouth daily.     diazepam (VALIUM) 5 MG tablet 1 tab po bid as needed for anxiety 60 tablet 5   estradiol (ESTRACE) 0.1 MG/GM vaginal cream Place 1 Applicatorful vaginally 2 (two) times a week.     ezetimibe (ZETIA) 10 MG tablet Take 1 tablet (10 mg total) by mouth daily. 90 tablet 1   fexofenadine (ALLEGRA) 180 MG tablet Take 180 mg by mouth daily as needed for allergies.     fluticasone (FLONASE) 50 MCG/ACT nasal spray Place 2 sprays into both nostrils daily. 16 g 6   gabapentin (NEURONTIN) 300 MG capsule Take 300 mg by mouth at bedtime.     nitroGLYCERIN (NITROSTAT) 0.4 MG SL tablet Place 1 tablet (0.4 mg total)  under the tongue every 5 (five) minutes as needed for chest pain. 25 tablet 6   ondansetron (ZOFRAN-ODT) 4 MG disintegrating tablet Take 1 tablet (4 mg total) by mouth every 6 (six) hours as needed for nausea or vomiting. 60 tablet 3   pantoprazole (PROTONIX) 40 MG tablet Take 1 tablet (40 mg total) by mouth daily. 180 tablet 0   Probiotic Product (PROBIOTIC DAILY PO) Take 1 capsule by mouth daily.     Prucalopride Succinate (MOTEGRITY) 2 MG TABS Take 1 tablet (2 mg total) by mouth daily. 90 tablet 1   Tiotropium Bromide Monohydrate (SPIRIVA RESPIMAT) 1.25 MCG/ACT AERS Inhale 2 puffs into the lungs daily. 4 g 3   tolterodine (DETROL LA) 4 MG 24 hr capsule Take 4 mg by mouth daily.     clopidogrel (PLAVIX) 75 MG tablet Take 1 tablet (75 mg total) by mouth daily.  90 tablet 1   famotidine (PEPCID) 40 MG tablet Take 1 tablet (40 mg total) by mouth daily. 90 tablet 1   lisinopril (ZESTRIL) 5 MG tablet Take 1 tablet (5 mg total) by mouth daily. 90 tablet 1   No facility-administered medications prior to visit.    Allergies  Allergen Reactions   Doxycycline Shortness Of Breath and Swelling    Lips swelling and short of breath   Demerol Hcl [Meperidine] Nausea And Vomiting   Fentanyl Nausea And Vomiting    Can tolerate with nausea meds  Pt states all pain medications cause N/V for her    Fluocinolone Nausea And Vomiting   Penicillins Hives   Breo Ellipta [Fluticasone Furoate-Vilanterol] Other (See Comments)    Lower extremity weakness, lightheadedness    Imdur [Isosorbide Nitrate] Nausea And Vomiting    Review of Systems  Constitutional:  Positive for appetite change. Negative for chills, fatigue and fever.  HENT:  Negative for congestion, dental problem, ear pain and sore throat.   Eyes:  Negative for discharge, redness and visual disturbance.  Respiratory:  Negative for cough, chest tightness, shortness of breath and wheezing.   Cardiovascular:  Negative for chest pain, palpitations and  leg swelling.  Gastrointestinal:  Negative for abdominal pain, blood in stool, diarrhea, nausea and vomiting.  Genitourinary:  Negative for difficulty urinating, dysuria, flank pain, frequency, hematuria and urgency.  Musculoskeletal:  Negative for arthralgias, back pain, joint swelling, myalgias and neck stiffness.  Skin:  Negative for pallor and rash.  Neurological:  Negative for dizziness, speech difficulty, weakness and headaches.  Hematological:  Negative for adenopathy. Does not bruise/bleed easily.  Psychiatric/Behavioral:  Negative for confusion and sleep disturbance. The patient is not nervous/anxious.     PE;    08/09/2023    8:02 AM 07/18/2023    9:05 AM 05/15/2023    2:33 PM  Vitals with BMI  Height 5' 4.5" 5' 4.5" 5\' 4"   Weight 126 lbs 13 oz 126 lbs 129 lbs 2 oz  BMI 21.44 21.3 22.15  Systolic 105 110 409  Diastolic 69 62 Laura  Pulse 74 80 89   [name] CMA chaperone today.  Gen: Alert, well appearing.  Patient is oriented to person, place, time, and situation. AFFECT: pleasant, lucid thought and speech. ENT: Ears: EACs clear, normal epithelium.  TMs with good light reflex and landmarks bilaterally.  Eyes: no injection, icteris, swelling, or exudate.  EOMI, PERRLA. Nose: no drainage or turbinate edema/swelling.  No injection or focal lesion.  Mouth: lips without lesion/swelling.  Oral mucosa pink and moist.  Dentition intact and without obvious caries or gingival swelling.  Oropharynx without erythema, exudate, or swelling.  Neck: supple/nontender.  No LAD, mass, or TM.  Carotid pulses 2+ bilaterally.  Trace/soft bruit on the left, no bruit on the right. CV: RRR, no m/r/g.   LUNGS: CTA bilat, nonlabored resps, good aeration in all lung fields. ABD: soft, NT, ND, BS normal.  No hepatospenomegaly or mass.  No bruits. EXT: no clubbing, cyanosis, or edema.  Musculoskeletal: no joint swelling, erythema, warmth, or tenderness.  ROM of all joints intact.  No tenderness to  palpation of the soft tissues of the neck, right shoulder, or right arm. Right arm strength 5 out of 5 proximally and distally.  Mild tremor in the right arm greater than left arm with arms held outstretched.  No significant tremor with finger-nose-finger testing. No rigidity of the joints. Skin - no sores or suspicious lesions or rashes or  color changes  Pertinent labs:  Lab Results  Component Value Date   TSH 0.89 08/03/2021   Lab Results  Component Value Date   WBC 6.8 08/03/2022   HGB 14.0 08/03/2022   HCT 42.1 08/03/2022   MCV 93.7 08/03/2022   PLT 259.0 08/03/2022   Lab Results  Component Value Date   CREATININE 0.76 02/01/2023   BUN 8 02/01/2023   NA 142 02/01/2023   K 5.2 (H) 02/01/2023   CL 106 02/01/2023   CO2 28 02/01/2023   Lab Results  Component Value Date   ALT 64 (H) 04/28/2023   AST 51 (H) 04/28/2023   ALKPHOS 88 04/28/2023   BILITOT 1.9 (H) 04/28/2023   Lab Results  Component Value Date   CHOL 102 02/01/2023   Lab Results  Component Value Date   HDL 45.80 02/01/2023   Lab Results  Component Value Date   LDLCALC 38 02/01/2023   Lab Results  Component Value Date   TRIG 92.0 02/01/2023   Lab Results  Component Value Date   CHOLHDL 2 02/01/2023   Lab Results  Component Value Date   HGBA1C 5.4 02/01/2023   HGBA1C 5.4 02/01/2023   HGBA1C 5.4 (A) 02/01/2023   HGBA1C 5.4 02/01/2023   ASSESSMENT AND PLAN:   No problem-specific Assessment & Plan notes found for this encounter.  #1 health maintenance exam: Reviewed age and gender appropriate health maintenance issues (prudent diet, regular exercise, health risks of tobacco and excessive alcohol, use of seatbelts, fire alarms in home, use of sunscreen).  Also reviewed age and gender appropriate health screening as well as vaccine recommendations. Vaccines: Flu->UTD.  Otherwise UTD. Labs: CBC, c-Met, lipid panel, and Hba1c (prediabetes). Cervical ca screening:  pt is s/p hysterectomy for benign  dx.  pt declines any further screening paps. Breast ca screening: mammogram scheduled for 09/13/23. Colon ca screening: recall 08/2024. Osteoporosis screening: 06/2021 DEXA w/osteopenia, plan rpt 06/2024. LUNG CA screening: due for rpt LDCT-->Lung-RADS 2, rpt planned 01/2024. Pt no longer smokes.  #2 hypertension, well-controlled on lisinopril 5 mg daily. Monitor electrolytes and creatinine today.  3.  Hypercholesterolemia, tolerating a atorvastatin 80 mg a day and Zetia 10 mg a day. Goal LDL less than Laura. LDL approximately 5 months ago was 38. Lipid panel and hepatic panel today.  #4 generalized anxiety disorder. Doing well long-term on diazepam 5 mg twice a day. Controlled substance contract updated.  Urine drug screen today.  5.  Gastroparesis. GI following. She has a very tough time trying to find anything to eat that she tolerates.  Referred to nutritionist today to see if they can help with this.  6.  Upper extremity tremor, right>> left. She notes this to be pretty recent onset. Possibly related to Motegrity. Discussed possible diagnosis of essential tremor but will look out for any worrisome symptoms or progression in the future. Observe for now.  #7 right upper arm pain. Consistent with overuse syndrome. Relative rest, Tylenol as needed, heat and/or ice as needed.  An After Visit Summary was printed and given to the patient.  FOLLOW UP:  Return in about 6 months (around 02/06/2024) for routine chronic illness f/u.  Signed:  Santiago Bumpers, MD           08/09/2023

## 2023-08-10 ENCOUNTER — Other Ambulatory Visit: Payer: Self-pay | Admitting: Family Medicine

## 2023-08-10 DIAGNOSIS — E875 Hyperkalemia: Secondary | ICD-10-CM

## 2023-08-11 ENCOUNTER — Encounter: Payer: Self-pay | Admitting: Family Medicine

## 2023-08-11 LAB — DRUG MONITORING PANEL 376104, URINE
Alphahydroxyalprazolam: NEGATIVE ng/mL (ref <25)
Alphahydroxymidazolam: NEGATIVE ng/mL (ref <50)
Alphahydroxytriazolam: NEGATIVE ng/mL (ref <50)
Aminoclonazepam: NEGATIVE ng/mL (ref <25)
Amphetamines: NEGATIVE ng/mL (ref <500)
Barbiturates: NEGATIVE ng/mL (ref <300)
Benzodiazepines: POSITIVE ng/mL — AB (ref <100)
Cocaine Metabolite: NEGATIVE ng/mL (ref <150)
Desmethyltramadol: NEGATIVE ng/mL (ref <100)
Hydroxyethylflurazepam: NEGATIVE ng/mL (ref <50)
Lorazepam: NEGATIVE ng/mL (ref <50)
Nordiazepam: 419 ng/mL — ABNORMAL HIGH (ref <50)
Opiates: NEGATIVE ng/mL (ref <100)
Oxazepam: 631 ng/mL — ABNORMAL HIGH (ref <50)
Oxycodone: NEGATIVE ng/mL (ref <100)
Temazepam: 607 ng/mL — ABNORMAL HIGH (ref <50)
Tramadol: NEGATIVE ng/mL (ref <100)

## 2023-08-11 LAB — DM TEMPLATE

## 2023-08-14 ENCOUNTER — Ambulatory Visit (HOSPITAL_COMMUNITY)
Admission: RE | Admit: 2023-08-14 | Discharge: 2023-08-14 | Disposition: A | Discharge status: Home / Self Care | Payer: PPO | Source: Ambulatory Visit | Attending: Surgery | Admitting: Surgery

## 2023-08-14 ENCOUNTER — Ambulatory Visit (INDEPENDENT_AMBULATORY_CARE_PROVIDER_SITE_OTHER)
Admission: RE | Admit: 2023-08-14 | Discharge: 2023-08-14 | Disposition: A | Discharge status: Home / Self Care | Payer: PPO | Source: Ambulatory Visit | Attending: Surgery | Admitting: Surgery

## 2023-08-14 ENCOUNTER — Ambulatory Visit: Payer: PPO | Admitting: Physician Assistant

## 2023-08-14 VITALS — BP 138/82 | HR 77 | Temp 97.3°F | Ht 64.5 in | Wt 129.1 lb

## 2023-08-14 DIAGNOSIS — I6523 Occlusion and stenosis of bilateral carotid arteries: Secondary | ICD-10-CM | POA: Diagnosis not present

## 2023-08-14 DIAGNOSIS — I739 Peripheral vascular disease, unspecified: Secondary | ICD-10-CM

## 2023-08-14 LAB — VAS US ABI WITH/WO TBI
Left ABI: 0.98
Right ABI: 0.96

## 2023-08-15 ENCOUNTER — Encounter: Payer: Self-pay | Admitting: Family Medicine

## 2023-08-16 NOTE — Progress Notes (Signed)
Office Note   History of Present Illness   [name] is a 72 y.o. ([birth year]) female who presents for surveillance of carotid artery stenosis.  She has a remote history of left common carotid artery stenting in 2006.  She has also had an innominate artery stent placed in 2007 and right carotid endarterectomy in 2008.  She does have a history of TIAs but no prior CVA.  The patient returns today for follow up. She denies any recent strokelike symptoms such as slurred speech, facial droop, sudden visual changes, or sudden weakness/numbness.  She takes a daily Plavix and statin.  Current Outpatient Medications  Medication Sig Dispense Refill   acetaminophen (TYLENOL) 325 MG tablet Take 2 tablets (650 mg total) by mouth every 6 (six) hours as needed for mild pain or moderate pain.     albuterol (VENTOLIN HFA) 108 (90 Base) MCG/ACT inhaler Inhale 2 puffs into the lungs every 6 (six) hours as needed for wheezing or shortness of breath. 8 g 6   atorvastatin (LIPITOR) 80 MG tablet Take 1 tablet (80 mg total) by mouth daily. 90 tablet 1   Calcium-Magnesium-Vitamin D (CALCIUM 1200+D3 PO) Take 1 tablet by mouth daily.     clopidogrel (PLAVIX) 75 MG tablet Take 1 tablet (75 mg total) by mouth daily. 90 tablet 1   diazepam (VALIUM) 5 MG tablet 1 tab po bid as needed for anxiety 60 tablet 5   estradiol (ESTRACE) 0.1 MG/GM vaginal cream Place 1 Applicatorful vaginally 2 (two) times a week.     ezetimibe (ZETIA) 10 MG tablet Take 1 tablet (10 mg total) by mouth daily. 90 tablet 1   famotidine (PEPCID) 40 MG tablet Take 1 tablet (40 mg total) by mouth daily. 90 tablet 1   fexofenadine (ALLEGRA) 180 MG tablet Take 180 mg by mouth daily as needed for allergies.     fluticasone (FLONASE) 50 MCG/ACT nasal spray Place 2 sprays into both nostrils daily. 16 g 6   gabapentin (NEURONTIN) 300 MG capsule Take 300 mg by mouth at bedtime.     nitroGLYCERIN (NITROSTAT) 0.4 MG SL tablet Place 1 tablet (0.4 mg  total) under the tongue every 5 (five) minutes as needed for chest pain. 25 tablet 6   ondansetron (ZOFRAN-ODT) 4 MG disintegrating tablet Take 1 tablet (4 mg total) by mouth every 6 (six) hours as needed for nausea or vomiting. 60 tablet 3   pantoprazole (PROTONIX) 40 MG tablet Take 1 tablet (40 mg total) by mouth daily. 180 tablet 0   Probiotic Product (PROBIOTIC DAILY PO) Take 1 capsule by mouth daily.     Prucalopride Succinate (MOTEGRITY) 2 MG TABS Take 1 tablet (2 mg total) by mouth daily. 90 tablet 1   Tiotropium Bromide Monohydrate (SPIRIVA RESPIMAT) 1.25 MCG/ACT AERS Inhale 2 puffs into the lungs daily. 4 g 3   tolterodine (DETROL LA) 4 MG 24 hr capsule Take 4 mg by mouth daily.     lisinopril (ZESTRIL) 5 MG tablet Take 1 tablet (5 mg total) by mouth daily. (Patient not taking: Reported on 08/14/2023) 90 tablet 1   No current facility-administered medications for this visit.    REVIEW OF SYSTEMS (negative unless checked):   Cardiac:  []  Chest pain or chest pressure? []  Shortness of breath upon activity? []  Shortness of breath when lying flat? []  Irregular heart rhythm?  Vascular:  []  Pain in calf, thigh, or hip brought on by walking? []  Pain in feet at night that wakes  you up from your sleep? []  Blood clot in your veins? []  Leg swelling?  Pulmonary:  []  Oxygen at home? []  Productive cough? []  Wheezing?  Neurologic:  []  Sudden weakness in arms or legs? []  Sudden numbness in arms or legs? []  Sudden onset of difficult speaking or slurred speech? []  Temporary loss of vision in one eye? []  Problems with dizziness?  Gastrointestinal:  []  Blood in stool? []  Vomited blood?  Genitourinary:  []  Burning when urinating? []  Blood in urine?  Psychiatric:  []  Major depression  Hematologic:  []  Bleeding problems? []  Problems with blood clotting?  Dermatologic:  []  Rashes or ulcers?  Constitutional:  []  Fever or chills?  Ear/Nose/Throat:  []  Change in hearing? []   Nose bleeds? []  Sore throat?  Musculoskeletal:  []  Back pain? []  Joint pain? []  Muscle pain?   Physical Examination   Vitals:   08/14/23 0831 08/14/23 0834  BP: 136/84 138/82  Pulse: 77   Temp: (!) 97.3 F (36.3 C)   TempSrc: Temporal   SpO2: 98%   Weight: 129 lb 1.6 oz (58.6 kg)   Height: 5' 4.5" (1.638 m)    Body mass index is 21.82 kg/m.  General:  WDWN in NAD; vital signs documented above Gait: Not observed HENT: WNL, normocephalic Pulmonary: normal non-labored breathing , without rales, rhonchi,  wheezing Cardiac: regular Abdomen: soft, NT, no masses Skin: without rashes Vascular Exam/Pulses: palpable radial pulses bilaterally Extremities: without ischemic changes, without gangrene , without cellulitis; without open wounds;  Musculoskeletal: no muscle wasting or atrophy  Neurologic: A&O X 3;  No focal weakness or paresthesias are detected Psychiatric:  The pt has Normal affect.  Non-Invasive Vascular Imaging   Bilateral Carotid Duplex (08/14/2023):  R ICA stenosis:  1-39% R VA:  patent and antegrade L ICA stenosis:  1-39% L VA:  patent and antegrade   Medical Decision Making   [name] is a 72 y.o. female who presents for surveillance of carotid artery stenosis  Based on the patient's vascular studies, her carotid artery stenosis is unchanged at 1 to 39% bilaterally She denies any strokelike symptoms such as slurred speech, facial droop, sudden visual changes, or sudden weakness/numbness.  She also denies any lower extremity claudication, rest pain, or tissue loss.  She does have some numbness of her toes occasionally likely due to neuropathy.  ABIs on the right are multiphasic and 0.96.  ABIs in the left are multiphasic and 0.98. She will continue her Plavix and statin and follow-up with our office in 1 year with repeat duplex   Laura Dubonnet PA-C [facility] Office: [phone]  Clinic MD: Myra Gianotti

## 2023-08-21 ENCOUNTER — Other Ambulatory Visit: Payer: Self-pay

## 2023-08-21 DIAGNOSIS — I6523 Occlusion and stenosis of bilateral carotid arteries: Secondary | ICD-10-CM

## 2023-08-28 ENCOUNTER — Other Ambulatory Visit (INDEPENDENT_AMBULATORY_CARE_PROVIDER_SITE_OTHER): Payer: PPO

## 2023-08-28 DIAGNOSIS — E875 Hyperkalemia: Secondary | ICD-10-CM

## 2023-08-28 LAB — BASIC METABOLIC PANEL
BUN: 9 mg/dL (ref 6–23)
CO2: 29 meq/L (ref 19–32)
Calcium: 10.8 mg/dL — ABNORMAL HIGH (ref 8.4–10.5)
Chloride: 106 meq/L (ref 96–112)
Creatinine, Ser: 0.74 mg/dL (ref 0.40–1.20)
GFR: 80.9 mL/min (ref >60.00)
Glucose, Bld: 94 mg/dL (ref 70–99)
Potassium: 4.3 meq/L (ref 3.5–5.1)
Sodium: 143 meq/L (ref 135–145)

## 2023-09-08 ENCOUNTER — Other Ambulatory Visit: Payer: Self-pay | Admitting: Gastroenterology

## 2023-09-08 ENCOUNTER — Encounter: Payer: Self-pay | Admitting: Family Medicine

## 2023-09-08 ENCOUNTER — Telehealth: Payer: Self-pay | Admitting: Gastroenterology

## 2023-09-08 MED ORDER — GABAPENTIN 300 MG PO CAPS: 300.0000 mg | ORAL_CAPSULE | Freq: Every day | ORAL | 3 refills | Status: DC

## 2023-09-08 MED ORDER — MOTEGRITY 2 MG PO TABS: 2.0000 mg | ORAL_TABLET | Freq: Every day | ORAL | 2 refills | Status: AC

## 2023-09-08 NOTE — Telephone Encounter (Signed)
Inbound call from patient stating she needs a refill for Motegrity. Please advise.

## 2023-09-08 NOTE — Telephone Encounter (Signed)
Rx for Motegrity sent to pharmacy as requested.

## 2023-09-08 NOTE — Telephone Encounter (Signed)
Okay, prescription sent 

## 2023-09-10 ENCOUNTER — Other Ambulatory Visit: Payer: Self-pay | Admitting: Family Medicine

## 2023-09-11 ENCOUNTER — Other Ambulatory Visit: Payer: Self-pay

## 2023-09-11 ENCOUNTER — Encounter: Payer: Self-pay | Admitting: Family Medicine

## 2023-09-11 MED ORDER — FLUTICASONE PROPIONATE 50 MCG/ACT NA SUSP: 2.0000 | Freq: Every day | NASAL | 1 refills | Status: DC

## 2023-09-13 ENCOUNTER — Ambulatory Visit
Admission: RE | Admit: 2023-09-13 | Discharge: 2023-09-13 | Disposition: A | Discharge status: Home / Self Care | Payer: PPO | Source: Ambulatory Visit | Attending: Family Medicine | Admitting: Family Medicine

## 2023-09-13 DIAGNOSIS — Z1231 Encounter for screening mammogram for malignant neoplasm of breast: Secondary | ICD-10-CM

## 2023-09-19 ENCOUNTER — Encounter: Payer: Self-pay | Admitting: Rheumatology

## 2023-09-21 ENCOUNTER — Telehealth (HOSPITAL_COMMUNITY): Payer: Self-pay

## 2023-09-21 NOTE — Telephone Encounter (Signed)
Patient was last seen in February 2024 by Dr. Corliss Skains.   Please schedule sooner office visit for further evaluation.

## 2023-09-21 NOTE — Telephone Encounter (Signed)
Pharmacy Patient Advocate Encounter   Received notification from CoverMyMeds that prior authorization for Odansetron ODT 4 mg tablets is required/requested.   Insurance verification completed.   The patient is insured through  [facility]  .   Per test claim: PA required; PA started via CoverMyMeds. KEY [id number] . Waiting for clinical questions to populate.  PER COVER MY MEDS PA CAN NOT BE DONE ELECTRONICALLY. I WILL CALL PLAN.

## 2023-09-30 ENCOUNTER — Encounter: Payer: Self-pay | Admitting: Gastroenterology

## 2023-10-04 ENCOUNTER — Telehealth: Payer: Self-pay | Admitting: Physician Assistant

## 2023-10-04 NOTE — Telephone Encounter (Signed)
   Pre-operative Risk Assessment    Patient Name: [name]  DOB: 06-Jun-1951 MRN: [id number]   Date of last office visit:  Date of next office visit:    Request for Surgical Clearance    Procedure:   EGD? Endo slip  Date of Surgery:  Clearance   10-13-23                              Surgeon:  Dr Gwynneth Socks Group or Practice Name:   Phone number:  [phone] Fax number:  [phone]   Type of Clearance Requested:  Medicine- Plavix - need to hod     Type of Anesthesia:   Propofol    Additional requests/questions:    Signed, Kyra CHRISTELLA Bull   10/04/2023, 2:04 PM

## 2023-10-04 NOTE — Telephone Encounter (Signed)
   Patient Name: [name]  DOB: 07-07-1951 MRN: [id number]  Primary Cardiologist: Candyce Reek, MD  Chart reviewed as part of pre-operative protocol coverage. 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 I spoke with patient who affirms she has been doing well without any new cardiac symptoms. They are able to achieve over 4 METS without cardiac limitations. Therefore, based on ACC/AHA guidelines, the patient would be at acceptable risk for the planned procedure without further cardiovascular testing. She knows to notify for any new concerns. From cardiac standpoint no contraindication to holding Plavix  - however, the main indication appears to be her vascular disease/TIA history as she does not otherwise have history of obstructive coronary diease, coronary stents or CABG. This is being prescribed by primary care. Therefore recommend surgical team reach out to vascular surgery and PCP for final clearance to hold Plavix  for requested procedure.  Will route this bundled recommendation to requesting provider via Epic fax function. Please call with questions.   [name] N Adler Chartrand, PA-C 10/04/2023, 2:46 PM

## 2023-10-05 ENCOUNTER — Telehealth: Payer: Self-pay | Admitting: Gastroenterology

## 2023-10-05 ENCOUNTER — Other Ambulatory Visit: Payer: Self-pay | Admitting: Family Medicine

## 2023-10-05 NOTE — Telephone Encounter (Signed)
 [employer] called regarding Zofran medication [phone] option 2 stated more information for the PA.

## 2023-10-07 NOTE — Progress Notes (Signed)
Office Visit Note  Patient: [name]             Date of Birth: June 18, 1951           MRN: [id number]             PCP: Jeoffrey Massed, MD Referring: Jeoffrey Massed, MD Visit Date: 10/20/2023 Occupation: @GUAROCC @  Subjective:  Raynaud's phenomenon  History of Present Illness: [name] is a 73 y.o. female with Raynauds and osteoarthritis.  She returns today after her last visit in February 2024.  History of pain in her hands due to Raynaud's phenomenon.  She states her Raynaud's has been more severe.  She states she has been drinking protein shakes and ice and also drinks ice water which makes her Raynauds worse.  Not noticed any digital ulcers.  Numbness in her toes.  She has not had much discomfort in the upper and lower back.    Activities of Daily Living:  Patient reports morning stiffness for 0 minutes.   Patient Denies nocturnal pain.  Difficulty dressing/grooming: Denies Difficulty climbing stairs: Denies Difficulty getting out of chair: Denies Difficulty using hands for taps, buttons, cutlery, and/or writing: Reports  Review of Systems  Constitutional:  Negative for fatigue.  HENT:  Positive for mouth dryness. Negative for mouth sores.   Eyes:  Positive for dryness.  Respiratory:  Negative for shortness of breath.   Cardiovascular:  Negative for chest pain and palpitations.  Gastrointestinal:  Positive for constipation, diarrhea and nausea. Negative for blood in stool.  Endocrine: Negative for increased urination.  Genitourinary:  Negative for involuntary urination.  Musculoskeletal:  Negative for joint pain, gait problem, joint pain, joint swelling, myalgias, muscle weakness, morning stiffness, muscle tenderness and myalgias.  Skin:  Positive for color change. Negative for rash, hair loss and sensitivity to sunlight.  Allergic/Immunologic: Negative for susceptible to infections.  Neurological:  Positive for numbness. Negative for dizziness and  headaches.  Hematological:  Negative for swollen glands.  Psychiatric/Behavioral:  Negative for depressed mood and sleep disturbance. The patient is not nervous/anxious.     PMFS History:  Patient Active Problem List   Diagnosis Date Noted   CAD (coronary artery disease) 07/17/2023   Flatulence 10/18/2022   Belching 10/18/2022   Other constipation 10/18/2022   S/P robot-assisted surgical procedure 05/12/2022   Other emphysema (HCC) 05/10/2021   Former smoker 05/10/2021   Hiatal hernia 01/18/2021   Overweight (BMI 25.0-29.9) 08/01/2019   DOE (dyspnea on exertion) 05/25/2019   Oral lesion 02/20/2019   Epistaxis, recurrent 02/20/2019   Mixed hyperlipidemia 05/22/2014   Essential hypertension, benign 05/22/2014   Peripheral vascular disease (HCC) 05/22/2014   Aftercare following surgery of the circulatory system, NEC 09/17/2013   Carotid artery disease without cerebral infarction (HCC) 08/06/2012    Past Medical History:  Diagnosis Date   Abdominal bruit 04/2017   Aortic u/s showed NO ANEURISM.   Anemia    low iron   Anxiety    CAD (coronary artery disease) 07/17/2023   -Cardiac catheterization 02/08/12: no CAD  -Myoview 02/05/2019: EF 56, no ischemia, low risk -CCTA 12/21/20: CAC score 907 (97th percentile); LM 0-24, LAD prox 25-49, RCA 50-69; FFR normal    Carotid artery occlusion    hx L carotid stent   Cataract    COPD (chronic obstructive pulmonary disease) (HCC)    DOE, worse since 11/2018.  Stress test neg 01/2019.  Albut x 1 inh prn helpful some.  Pulm 06/2019,  PFTs not diag of COPD, DOE likely from deconditioning.   Coronary artery disease    Elevated transaminase level    Very mild, 2024.  Liver normal on ultrasound.   Essential hypertension, benign 05/22/2014   Gastroparesis 12/2022   delayed emptying on scan->reglan (Dr. Cliffton Asters)   GERD (gastroesophageal reflux disease)    + hx of esophagitis   Sullivan Lone syndrome    H/O hiatal hernia 02/2019   EGD-->moderate  size->surgery recommended but pt declined.   Hematochezia 2020/21   internal hemorrhoids   History of iron deficiency anemia 07/2019   Dr. Barron Alvine EGD and colonoscopy, some anorectal muscosal ulceration, o/w just mild chronic gastritis.   Also could be poor absorption due to chronic high dose acid suppression. Hb/iron normalized aft iron infus and ongoing oral iron.   History of kidney stones    one time   ILD (interstitial lung disease) (HCC)    Detected on lung cancer screening CT 01/2022.  ILD blood panel all normal except weakly + myositis ab's.  Pt to be evaluated by Dr. Marchelle Gearing asymptomatic as of 03/2022.   Mitral valve prolapse    Mixed hyperlipidemia 05/22/2014   OAB (overactive bladder)    detrol LA from urol helpful   Osteopenia 06/06/2016   05/2016 DEXA T-score -1.5.  06/2018 DEXA T score -1.1.  T score -1.2 06/2021   Peripheral vascular disease, unspecified (HCC) 05/22/2014   innominate and carotid.  U/S f/u 12/2016 showed no signif change compared to 2017--vascular recommended repeat 1 yr.  Doing well as of 08/2018 cardiol f/u-->continued on ASA and Plavix. F/u 08/2019->ASA d/c'd, plavix continued.   Prediabetes 2018   2018 A1c 6.3%.  05/2018 A1c 6.1%. 07/2019 A1c 6.4%.   Raynaud disease    Seasonal allergies    TIA (transient ischemic attack) 2006    Family History  Problem Relation Age of Onset   Heart attack Mother    CAD Mother    Heart disease Mother        Before age 34   Hypertension Mother    Hyperlipidemia Mother    Hypertension Father    Pneumonia Father    Hyperlipidemia Father    Heart murmur Sister    Osteoporosis Paternal Grandmother    CAD Paternal Grandfather    Breast cancer Neg Hx    Colon cancer Neg Hx    Esophageal cancer Neg Hx    Rectal cancer Neg Hx    Stomach cancer Neg Hx    Colon polyps Neg Hx    Pancreatic cancer Neg Hx    Past Surgical History:  Procedure Laterality Date   ABDOMINAL HYSTERECTOMY  1997   CARDIAC CATHETERIZATION   01/2012   NORMAL   CARDIOVASCULAR STRESS TEST  02/05/2019   NORMAL.  EF normal.   CAROTID ENDARTERECTOMY  08/21/2007   right - Dr. Madilyn Fireman   CHOLECYSTECTOMY  2000   COLONOSCOPY  approx 2008; 09/23/19   2008 (Dr. Bunnie Pion pt->normal.  08/2019 adenoma, some friable areas of colon + anorectal mucosa inflam w/ ulceration, +diverticulosis. Recall 5 yrs   DEXA  06/06/2016   05/2016 T-score -1.5.  06/2018 T score -1.1.  T score -1.2 06/2021.   ESOPHAGOGASTRODUODENOSCOPY  03/11/2019; 09/23/19   02/2019; 5 cm hiatal hernia; GI suggested surgical repair due to intractable GER.  08/2019 mild chronic gastritis, h pylori neg, multiple benign bx's including duodenum neg for celiac changes.   ESOPHAGOGASTRODUODENOSCOPY N/A 01/18/2021   Procedure: ESOPHAGOGASTRODUODENOSCOPY (EGD);  Surgeon: Corliss Skains, MD;  Location: [facility] OR;  Service: Thoracic;  Laterality: N/A;   ESOPHAGOGASTRODUODENOSCOPY N/A 05/12/2022   Procedure: ESOPHAGOGASTRODUODENOSCOPY (EGD);  Surgeon: Corliss Skains, MD;  Location: [facility] OR;  Service: Thoracic;  Laterality: N/A;   Incision and drainage of left thenar space abscess  02/23/2015   s/p cat bite (Dr. Amanda Pea)   innominate stent  2007   12/2014 u/s showed patent stent in innominate and L common carotid.  L prox int carotid 40-59% stenosis   LDCT for lung ca screening  06/2019   NEG->rpt 1 yr   Left common carotid stent  2004/11/25 and 25-Nov-2005   "                    "                  "                          "                             "   PEXY N/A 05/12/2022   Procedure: GASTROPEXY;  Surgeon: Corliss Skains, MD;  Location: [facility] OR;  Service: Thoracic;  Laterality: N/A;   SINOSCOPY     TRANSTHORACIC ECHOCARDIOGRAM  11/25/05   NORMAL   TUBAL LIGATION     UPPER GI ENDOSCOPY  10/13/2023   with biopsies   US CAROTID DOPPLER BILATERAL ([facility] HX)  06/2019   left stent w/out stenosis.  R ICA sten 40-50% obst-stable.  07/20/21 R clear, L 40-59%, normal vert and subclav flow.   07/2022 stable   XI ROBOTIC ASSISTED HIATAL HERNIA REPAIR N/A 05/12/2022   Procedure: XI ROBOTIC ASSISTED HIATAL HERNIA REPAIR;  Surgeon: Corliss Skains, MD;  Location: [facility] OR;  Service: Thoracic;  Laterality: N/A;   XI ROBOTIC ASSISTED PARAESOPHAGEAL HERNIA REPAIR N/A 01/18/2021   Procedure: XI ROBOTIC ASSISTED PARAESOPHAGEAL HERNIA REPAIR WITH FUNDOPLICATION WITH MESH;  Surgeon: Corliss Skains, MD;  Location: [facility] OR;  Service: Thoracic;  Laterality: N/A;   Social History   Social History Narrative   Widowed as of 11-25-2014 (husband died of viral illness while out of town in [place], sudden), has one daughter.   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   Tobacco: 20 pack-yr hx, quit 11/26/2007.   Alcohol: rare.   No hx of alc or drug problems.   Exercise: walks 2.7miles on treadmill daily.   Immunization History  Administered Date(s) Administered   Fluad Quad(high Dose 65+) 06/11/2019, 06/27/2022, 07/03/2023   Hepatitis A, Adult 04/04/2023, 10/10/2023   Hepb-cpg 04/04/2023, 05/09/2023   Influenza, High Dose Seasonal PF 05/23/2018, 07/21/2020   Influenza-Unspecified 07/01/2021   Moderna Covid-19 Vaccine Bivalent Booster 38yrs & up 07/22/2021   Moderna Sars-Covid-2 Vaccination 10/23/2019, 11/20/2019, 07/21/2020   Pfizer Covid-19 Vaccine Bivalent Booster 26yrs & up 06/27/2022   Pneumococcal Conjugate-13 05/23/2016   Pneumococcal Polysaccharide-23 05/24/2017   Respiratory Syncytial Virus Vaccine,Recomb Aduvanted(Arexvy) 07/15/2022   Tdap 02/22/2015   Zoster Recombinant(Shingrix) 07/11/2018, 10/18/2018     Objective: Vital Signs: BP 108/74 (BP Location: Left Arm, Patient Position: Sitting, Cuff Size: Normal)   Pulse 84   Resp 13   Ht 5' 4.5" (1.638 m)   Wt 132 lb (59.9 kg)   BMI 22.31 kg/m    Physical Exam Vitals and nursing note reviewed.  Constitutional:      Appearance: She is well-developed.  HENT:     Head: Normocephalic and  atraumatic.  Eyes:     Conjunctiva/sclera: Conjunctivae normal.  Cardiovascular:     Rate and Rhythm: Normal rate and regular rhythm.     Heart sounds: Normal heart sounds.  Pulmonary:     Effort: Pulmonary effort is normal.     Breath sounds: Normal breath sounds.  Abdominal:     General: Bowel sounds are normal.     Palpations: Abdomen is soft.  Musculoskeletal:     Cervical back: Normal range of motion.  Lymphadenopathy:     Cervical: No cervical adenopathy.  Skin:    General: Skin is warm and dry.     Capillary Refill: Capillary refill takes less than 2 seconds.     Comments: She had good capillary refill with no nailbed capillary changes.  No sclerodactyly or telangiectasia were noted.  Neurological:     Mental Status: She is alert and oriented to person, place, and time.  Psychiatric:        Behavior: Behavior normal.      Musculoskeletal Exam: Cervical, thoracic and lumbar spine were in good range of motion.  Shoulder joints, elbow joints, wrist joints, MCPs PIPs and DIPs were in good range of motion with no synovitis.  Hip joints, knee joints, ankles, MTPs and PIPs were in good range of motion with no synovitis.  CDAI Exam: CDAI Score: -- Patient Global: --; Provider Global: -- Swollen: --; Tender: -- Joint Exam 10/20/2023   No joint exam has been documented for this visit   There is currently no information documented on the homunculus. Go to the Rheumatology activity and complete the homunculus joint exam.  Investigation: No additional findings.  Imaging: No results found.   Recent Labs: Lab Results  Component Value Date   WBC 5.9 08/09/2023   HGB 14.4 08/09/2023   PLT 256.0 08/09/2023   NA 143 08/28/2023   K 4.3 08/28/2023   CL 106 08/28/2023   CO2 29 08/28/2023   GLUCOSE 94 08/28/2023   BUN 9 08/28/2023   CREATININE 0.74 08/28/2023   BILITOT 1.8 (H) 08/09/2023   ALKPHOS 100 08/09/2023   AST 31 08/09/2023   ALT 34 08/09/2023   PROT 6.5  08/09/2023   ALBUMIN 4.3 08/09/2023   CALCIUM 10.8 (H) 08/28/2023   GFRAA >60 05/15/2020    Speciality Comments: No specialty comments available.  Procedures:  No procedures performed Allergies: Doxycycline, Demerol hcl [meperidine], Fentanyl, Fluocinolone, Penicillins, Breo ellipta [fluticasone furoate-vilanterol], and Imdur [isosorbide nitrate]   Assessment / Plan:     Visit Diagnoses: Raynaud's phenomenon without gangrene -patient states her Raynaud's symptoms are getting worse.  She brought some pictures on her cell phone.  She states she drinks ice water all day and also makes protein shake with a lot of ice.  She states she sits on a heated blanket and sugars as the protein shakes makes her very cold.  No sclerodactyly, nailbed capillary changes were noted.  There is no history of digital ulcers.  All autoimmune workup has been negative in the past.  I do not see the need for any further labs.  Detail counseled regarding Raynauds was provided.  Keeping core temperature warm was advised.  I advised her to avoid drinking ice cold drinks and avoiding colder temperatures.  I am unable to use calcium channel blockers or sildenafil due to her low blood pressure.  I advised her to contact  me if her symptoms get worse.  A handout on Raynauds was provided.  Chronic bilateral thoracic back pain - Infrascapular discomfort improved.  Patient has been practicing yoga which helps.  Lumbar spondylosis -she states her lower back pain has improved.  Previous x-rays showed multilevel spondylosis, spondylolisthesis and facet joint arthropathy.  Osteopenia of multiple sites - DEXA scan October 2022: T-score -1.2.  Use of calcium rich diet and vitamin D was advised.  Essential hypertension, benign-blood pressure is normal today.  Carotid artery disease without cerebral infarction [facility])  Mixed hyperlipidemia  Peripheral vascular disease (HCC)  S/P robot-assisted surgical procedure-hiatal hernia repair  x2  Other emphysema (HCC)  Former smoker  Orders: No orders of the defined types were placed in this encounter.  No orders of the defined types were placed in this encounter.    Follow-Up Instructions: Return in about 1 year (around 10/19/2024) for raynauds phenomenon.   Pollyann Savoy, MD  Note - This record has been created using Animal nutritionist.  Chart creation errors have been sought, but may not always  have been located. Such creation errors do not reflect on  the standard of medical care.

## 2023-10-10 ENCOUNTER — Ambulatory Visit: Payer: PPO | Admitting: Gastroenterology

## 2023-10-10 DIAGNOSIS — Z23 Encounter for immunization: Secondary | ICD-10-CM

## 2023-10-11 NOTE — Progress Notes (Signed)
 See nursing notes for vaccination.

## 2023-10-13 ENCOUNTER — Telehealth: Payer: Self-pay

## 2023-10-13 DIAGNOSIS — K2950 Unspecified chronic gastritis without bleeding: Secondary | ICD-10-CM | POA: Diagnosis not present

## 2023-10-13 DIAGNOSIS — Z87891 Personal history of nicotine dependence: Secondary | ICD-10-CM | POA: Diagnosis not present

## 2023-10-13 DIAGNOSIS — Z79899 Other long term (current) drug therapy: Secondary | ICD-10-CM | POA: Diagnosis not present

## 2023-10-13 DIAGNOSIS — I10 Essential (primary) hypertension: Secondary | ICD-10-CM | POA: Diagnosis not present

## 2023-10-13 DIAGNOSIS — Z9889 Other specified postprocedural states: Secondary | ICD-10-CM | POA: Diagnosis not present

## 2023-10-13 DIAGNOSIS — K219 Gastro-esophageal reflux disease without esophagitis: Secondary | ICD-10-CM | POA: Diagnosis not present

## 2023-10-13 DIAGNOSIS — K3189 Other diseases of stomach and duodenum: Secondary | ICD-10-CM | POA: Diagnosis not present

## 2023-10-13 DIAGNOSIS — K449 Diaphragmatic hernia without obstruction or gangrene: Secondary | ICD-10-CM | POA: Diagnosis not present

## 2023-10-13 DIAGNOSIS — K3184 Gastroparesis: Secondary | ICD-10-CM | POA: Diagnosis not present

## 2023-10-13 DIAGNOSIS — K2900 Acute gastritis without bleeding: Secondary | ICD-10-CM | POA: Diagnosis not present

## 2023-10-13 HISTORY — PX: UPPER GI ENDOSCOPY: SHX6162

## 2023-10-13 NOTE — Telephone Encounter (Signed)
Pharmacy Patient Advocate Encounter  Received notification from [facility] [employer] that Prior Authorization for Ondansetron 4MG  dispersible tablets  has been DENIED.  Full denial letter will be uploaded to the media tab. See denial reason below.   PA #/Case ID/Reference #:

## 2023-10-13 NOTE — Telephone Encounter (Signed)
Patient does not meet criteria for medication.   PA request has been Denied. New Encounter created for follow up. For additional info see Pharmacy Prior Auth telephone encounter from 1/17.

## 2023-10-14 DIAGNOSIS — K3189 Other diseases of stomach and duodenum: Secondary | ICD-10-CM | POA: Diagnosis not present

## 2023-10-14 DIAGNOSIS — K2950 Unspecified chronic gastritis without bleeding: Secondary | ICD-10-CM | POA: Diagnosis not present

## 2023-10-14 DIAGNOSIS — K3184 Gastroparesis: Secondary | ICD-10-CM | POA: Diagnosis not present

## 2023-10-14 DIAGNOSIS — Z9889 Other specified postprocedural states: Secondary | ICD-10-CM | POA: Diagnosis not present

## 2023-10-14 DIAGNOSIS — K449 Diaphragmatic hernia without obstruction or gangrene: Secondary | ICD-10-CM | POA: Diagnosis not present

## 2023-10-18 ENCOUNTER — Ambulatory Visit: Payer: PPO | Admitting: *Deleted

## 2023-10-18 DIAGNOSIS — Z0000 Encounter for general adult medical examination without abnormal findings: Secondary | ICD-10-CM | POA: Diagnosis not present

## 2023-10-18 NOTE — Patient Instructions (Signed)
[name] , Thank you for taking time to come for your Medicare Wellness Visit. I appreciate your ongoing commitment to your health goals. Please review the following plan we discussed and let me know if I can assist you in the future.   Screening recommendations/referrals: Colonoscopy: up to date Mammogram: up to date Bone Density: up to date Recommended yearly ophthalmology/optometry visit for glaucoma screening and checkup Recommended yearly dental visit for hygiene and checkup  Vaccinations: Influenza vaccine: up to date Pneumococcal vaccine: up to date Tdap vaccine: up to date Shingles vaccine: up to date    Advanced directives: yes not on file   Preventive Care 73 Years and Older, Female Preventive care refers to lifestyle choices and visits with your health care provider that can promote health and wellness. What does preventive care include? A yearly physical exam. This is also called an annual well check. Dental exams once or twice a year. Routine eye exams. Ask your health care provider how often you should have your eyes checked. Personal lifestyle choices, including: Daily care of your teeth and gums. Regular physical activity. Eating a healthy diet. Avoiding tobacco and drug use. Limiting alcohol use. Practicing safe sex. Taking low-dose aspirin every day. Taking vitamin and mineral supplements as recommended by your health care provider. What happens during an annual well check? The services and screenings done by your health care provider during your annual well check will depend on your age, overall health, lifestyle risk factors, and family history of disease. Counseling  Your health care provider may ask you questions about your: Alcohol use. Tobacco use. Drug use. Emotional well-being. Home and relationship well-being. Sexual activity. Eating habits. History of falls. Memory and ability to understand (cognition). Work and work  Astronomer. Reproductive health. Screening  You may have the following tests or measurements: Height, weight, and BMI. Blood pressure. Lipid and cholesterol levels. These may be checked every 5 years, or more frequently if you are over 73 years old. Skin check. Lung cancer screening. You may have this screening every year starting at age 73 if you have a 30-pack-year history of smoking and currently smoke or have quit within the past 15 years. Fecal occult blood test (FOBT) of the stool. You may have this test every year starting at age 73. Flexible sigmoidoscopy or colonoscopy. You may have a sigmoidoscopy every 5 years or a colonoscopy every 10 years starting at age 73. Hepatitis C blood test. Hepatitis B blood test. Sexually transmitted disease (STD) testing. Diabetes screening. This is done by checking your blood sugar (glucose) after you have not eaten for a while (fasting). You may have this done every 1-3 years. Bone density scan. This is done to screen for osteoporosis. You may have this done starting at age 73. Mammogram. This may be done every 1-2 years. Talk to your health care provider about how often you should have regular mammograms. Talk with your health care provider about your test results, treatment options, and if necessary, the need for more tests. Vaccines  Your health care provider may recommend certain vaccines, such as: Influenza vaccine. This is recommended every year. Tetanus, diphtheria, and acellular pertussis (Tdap, Td) vaccine. You may need a Td booster every 10 years. Zoster vaccine. You may need this after age 73. Pneumococcal 13-valent conjugate (PCV13) vaccine. One dose is recommended after age 74. Pneumococcal polysaccharide (PPSV23) vaccine. One dose is recommended after age 32. Talk to your health care provider about which screenings and vaccines you need and  how often you need them. This information is not intended to replace advice given to you by  your health care provider. Make sure you discuss any questions you have with your health care provider. Document Released: 10/09/2015 Document Revised: 06/01/2016 Document Reviewed: 07/14/2015 Elsevier Interactive Patient Education  2017 [employer].  Fall Prevention in the Home Falls can cause injuries. They can happen to people of all ages. There are many things you can do to make your home safe and to help prevent falls. What can I do on the outside of my home? Regularly fix the edges of walkways and driveways and fix any cracks. Remove anything that might make you trip as you walk through a door, such as a raised step or threshold. Trim any bushes or trees on the path to your home. Use bright outdoor lighting. Clear any walking paths of anything that might make someone trip, such as rocks or tools. Regularly check to see if handrails are loose or broken. Make sure that both sides of any steps have handrails. Any raised decks and porches should have guardrails on the edges. Have any leaves, snow, or ice cleared regularly. Use sand or salt on walking paths during winter. Clean up any spills in your garage right away. This includes oil or grease spills. What can I do in the bathroom? Use night lights. Install grab bars by the toilet and in the tub and shower. Do not use towel bars as grab bars. Use non-skid mats or decals in the tub or shower. If you need to sit down in the shower, use a plastic, non-slip stool. Keep the floor dry. Clean up any water that spills on the floor as soon as it happens. Remove soap buildup in the tub or shower regularly. Attach bath mats securely with double-sided non-slip rug tape. Do not have throw rugs and other things on the floor that can make you trip. What can I do in the bedroom? Use night lights. Make sure that you have a light by your bed that is easy to reach. Do not use any sheets or blankets that are too big for your bed. They should not hang  down onto the floor. Have a firm chair that has side arms. You can use this for support while you get dressed. Do not have throw rugs and other things on the floor that can make you trip. What can I do in the kitchen? Clean up any spills right away. Avoid walking on wet floors. Keep items that you use a lot in easy-to-reach places. If you need to reach something above you, use a strong step stool that has a grab bar. Keep electrical cords out of the way. Do not use floor polish or wax that makes floors slippery. If you must use wax, use non-skid floor wax. Do not have throw rugs and other things on the floor that can make you trip. What can I do with my stairs? Do not leave any items on the stairs. Make sure that there are handrails on both sides of the stairs and use them. Fix handrails that are broken or loose. Make sure that handrails are as long as the stairways. Check any carpeting to make sure that it is firmly attached to the stairs. Fix any carpet that is loose or worn. Avoid having throw rugs at the top or bottom of the stairs. If you do have throw rugs, attach them to the floor with carpet tape. Make sure that you have  a light switch at the top of the stairs and the bottom of the stairs. If you do not have them, ask someone to add them for you. What else can I do to help prevent falls? Wear shoes that: Do not have high heels. Have rubber bottoms. Are comfortable and fit you well. Are closed at the toe. Do not wear sandals. If you use a stepladder: Make sure that it is fully opened. Do not climb a closed stepladder. Make sure that both sides of the stepladder are locked into place. Ask someone to hold it for you, if possible. Clearly mark and make sure that you can see: Any grab bars or handrails. First and last steps. Where the edge of each step is. Use tools that help you move around (mobility aids) if they are needed. These  include: Canes. Walkers. Scooters. Crutches. Turn on the lights when you go into a dark area. Replace any light bulbs as soon as they burn out. Set up your furniture so you have a clear path. Avoid moving your furniture around. If any of your floors are uneven, fix them. If there are any pets around you, be aware of where they are. Review your medicines with your doctor. Some medicines can make you feel dizzy. This can increase your chance of falling. Ask your doctor what other things that you can do to help prevent falls. This information is not intended to replace advice given to you by your health care provider. Make sure you discuss any questions you have with your health care provider. Document Released: 07/09/2009 Document Revised: 02/18/2016 Document Reviewed: 10/17/2014 Elsevier Interactive Patient Education  2017 [employer].

## 2023-10-18 NOTE — Telephone Encounter (Signed)
I called the patient to follow up about Ondansetron.  Patient stated that she had the Rx filled and did not run it through her insurance instead she used a GoodRx card.  She was able to pick up the medication for $20 for 2 months supply.  She was very happy that she was able to pick up the medication and thanked me for the call and concern.

## 2023-10-18 NOTE — Progress Notes (Signed)
Subjective:   [name] is a 73 y.o. female who presents for Medicare Annual (Subsequent) preventive examination.  Visit Complete: Virtual I connected with  [name] on 10/18/23 by a audio enabled telemedicine application and verified that I am speaking with the correct person using two identifiers.  Patient Location: Home  Provider Location: Home Office  I discussed the limitations of evaluation and management by telemedicine. The patient expressed understanding and agreed to proceed.  Vital Signs: Because this visit was a virtual/telehealth visit, some criteria may be missing or patient reported. Any vitals not documented were not able to be obtained and vitals that have been documented are patient reported.  Unable to connect to video  Cardiac Risk Factors include: advanced age (>41men, >36 women);family history of premature cardiovascular disease;hypertension     Objective:    There were no vitals filed for this visit. There is no height or weight on file to calculate BMI.     10/18/2023   11:37 AM 10/12/2022   10:30 AM 05/12/2022    6:15 PM 05/10/2022    1:18 PM 01/24/2022   11:11 AM 11/01/2021   11:17 PM 10/06/2021    2:38 PM  Advanced Directives  Does Patient Have a Medical Advance Directive? Yes No No No No No Yes  Type of Advance Directive Healthcare Power of [employer] Power of Attorney  Does patient want to make changes to medical advance directive? No - Patient declined        Copy of Healthcare Power of Attorney in Chart?       No - copy requested  Would patient like information on creating a medical advance directive?  No - Patient declined No - Patient declined No - Patient declined  No - Patient declined     Current Medications (verified) Outpatient Encounter Medications as of 10/18/2023  Medication Sig   acetaminophen (TYLENOL) 325 MG tablet Take 2 tablets (650 mg total) by mouth every 6 (six) hours as needed for mild pain or moderate  pain.   albuterol (VENTOLIN HFA) 108 (90 Base) MCG/ACT inhaler Inhale 2 puffs into the lungs every 6 (six) hours as needed for wheezing or shortness of breath.   atorvastatin (LIPITOR) 80 MG tablet Take 1 tablet (80 mg total) by mouth daily.   Calcium-Magnesium-Vitamin D (CALCIUM 1200+D3 PO) Take 1 tablet by mouth daily.   clopidogrel (PLAVIX) 75 MG tablet Take 1 tablet (75 mg total) by mouth daily.   diazepam (VALIUM) 5 MG tablet 1 tab po bid as needed for anxiety   estradiol (ESTRACE) 0.1 MG/GM vaginal cream Place 1 Applicatorful vaginally 2 (two) times a week.   ezetimibe (ZETIA) 10 MG tablet Take 1 tablet (10 mg total) by mouth daily.   famotidine (PEPCID) 40 MG tablet Take 1 tablet (40 mg total) by mouth daily.   fexofenadine (ALLEGRA) 180 MG tablet Take 180 mg by mouth daily as needed for allergies.   fluticasone (FLONASE) 50 MCG/ACT nasal spray Place 2 sprays into both nostrils daily.   gabapentin (NEURONTIN) 300 MG capsule Take 1 capsule (300 mg total) by mouth at bedtime.   lisinopril (ZESTRIL) 5 MG tablet Take 1 tablet (5 mg total) by mouth daily.   nitroGLYCERIN (NITROSTAT) 0.4 MG SL tablet Place 1 tablet (0.4 mg total) under the tongue every 5 (five) minutes as needed for chest pain.   ondansetron (ZOFRAN-ODT) 4 MG disintegrating tablet Take 1 tablet (4 mg total) by mouth  every 6 (six) hours as needed for nausea or vomiting.   pantoprazole (PROTONIX) 40 MG tablet Take 1 tablet (40 mg total) by mouth daily.   Probiotic Product (PROBIOTIC DAILY PO) Take 1 capsule by mouth daily.   Prucalopride Succinate (MOTEGRITY) 2 MG TABS Take 1 tablet (2 mg total) by mouth daily.   Tiotropium Bromide Monohydrate (SPIRIVA RESPIMAT) 1.25 MCG/ACT AERS Inhale 2 puffs into the lungs daily.   tolterodine (DETROL LA) 4 MG 24 hr capsule Take 4 mg by mouth daily.   No facility-administered encounter medications on file as of 10/18/2023.    Allergies (verified) Doxycycline, Demerol hcl [meperidine],  Fentanyl, Fluocinolone, Penicillins, Breo ellipta [fluticasone furoate-vilanterol], and Imdur [isosorbide nitrate]   History: Past Medical History:  Diagnosis Date   Abdominal bruit 04/2017   Aortic u/s showed NO ANEURISM.   Anemia    low iron   Anxiety    CAD (coronary artery disease) 07/17/2023   -Cardiac catheterization 02/08/12: no CAD  -Myoview 02/05/2019: EF 56, no ischemia, low risk -CCTA 12/21/20: CAC score 907 (97th percentile); LM 0-24, LAD prox 25-49, RCA 50-69; FFR normal    Carotid artery occlusion    hx L carotid stent   Cataract    COPD (chronic obstructive pulmonary disease) (HCC)    DOE, worse since 11/2018.  Stress test neg 01/2019.  Albut x 1 inh prn helpful some.  Pulm 06/2019, PFTs not diag of COPD, DOE likely from deconditioning.   Coronary artery disease    Elevated transaminase level    Very mild, 2024.  Liver normal on ultrasound.   Essential hypertension, benign 05/22/2014   Gastroparesis 12/2022   delayed emptying on scan->reglan (Dr. Cliffton Asters)   GERD (gastroesophageal reflux disease)    + hx of esophagitis   Sullivan Lone syndrome    H/O hiatal hernia 02/2019   EGD-->moderate size->surgery recommended but pt declined.   Hematochezia 2020/21   internal hemorrhoids   History of iron deficiency anemia 07/2019   Dr. Barron Alvine EGD and colonoscopy, some anorectal muscosal ulceration, o/w just mild chronic gastritis.   Also could be poor absorption due to chronic high dose acid suppression. Hb/iron normalized aft iron infus and ongoing oral iron.   History of kidney stones    one time   ILD (interstitial lung disease) (HCC)    Detected on lung cancer screening CT 01/2022.  ILD blood panel all normal except weakly + myositis ab's.  Pt to be evaluated by Dr. Marchelle Gearing asymptomatic as of 03/2022.   Mitral valve prolapse    Mixed hyperlipidemia 05/22/2014   OAB (overactive bladder)    detrol LA from urol helpful   Osteopenia 06/06/2016   05/2016 DEXA T-score -1.5.   06/2018 DEXA T score -1.1.  T score -1.2 06/2021   Peripheral vascular disease, unspecified (HCC) 05/22/2014   innominate and carotid.  U/S f/u 12/2016 showed no signif change compared to 2017--vascular recommended repeat 1 yr.  Doing well as of 08/2018 cardiol f/u-->continued on ASA and Plavix. F/u 08/2019->ASA d/c'd, plavix continued.   Prediabetes 2018   2018 A1c 6.3%.  05/2018 A1c 6.1%. 07/2019 A1c 6.4%.   Raynaud disease    Seasonal allergies    TIA (transient ischemic attack) 2006   Past Surgical History:  Procedure Laterality Date   ABDOMINAL HYSTERECTOMY  1997   CARDIAC CATHETERIZATION  01/2012   NORMAL   CARDIOVASCULAR STRESS TEST  02/05/2019   NORMAL.  EF normal.   CAROTID ENDARTERECTOMY  08/21/2007   right - Dr.  Madilyn Fireman   CHOLECYSTECTOMY  2000   COLONOSCOPY  approx 2008; 09/23/19   2008 (Dr. Bunnie Pion pt->normal.  08/2019 adenoma, some friable areas of colon + anorectal mucosa inflam w/ ulceration, +diverticulosis. Recall 5 yrs   DEXA  06/06/2016   05/2016 T-score -1.5.  06/2018 T score -1.1.  T score -1.2 06/2021.   ESOPHAGOGASTRODUODENOSCOPY  03/11/2019; 09/23/19   02/2019; 5 cm hiatal hernia; GI suggested surgical repair due to intractable GER.  08/2019 mild chronic gastritis, h pylori neg, multiple benign bx's including duodenum neg for celiac changes.   ESOPHAGOGASTRODUODENOSCOPY N/A 01/18/2021   Procedure: ESOPHAGOGASTRODUODENOSCOPY (EGD);  Surgeon: Corliss Skains, MD;  Location: [facility] OR;  Service: Thoracic;  Laterality: N/A;   ESOPHAGOGASTRODUODENOSCOPY N/A 05/12/2022   Procedure: ESOPHAGOGASTRODUODENOSCOPY (EGD);  Surgeon: Corliss Skains, MD;  Location: [facility] OR;  Service: Thoracic;  Laterality: N/A;   Incision and drainage of left thenar space abscess  02/23/2015   s/p cat bite (Dr. Amanda Pea)   innominate stent  2007   12/2014 u/s showed patent stent in innominate and L common carotid.  L prox int carotid 40-59% stenosis   LDCT for lung ca screening  06/2019    NEG->rpt 1 yr   Left common carotid stent  2006 and 2007   "                    "                  "                          "                             "   PEXY N/A 05/12/2022   Procedure: GASTROPEXY;  Surgeon: Corliss Skains, MD;  Location: [facility] OR;  Service: Thoracic;  Laterality: N/A;   SINOSCOPY     TRANSTHORACIC ECHOCARDIOGRAM  2007   NORMAL   TUBAL LIGATION     US CAROTID DOPPLER BILATERAL ([facility] HX)  06/2019   left stent w/out stenosis.  R ICA sten 40-50% obst-stable.  07/20/21 R clear, L 40-59%, normal vert and subclav flow.  07/2022 stable   XI ROBOTIC ASSISTED HIATAL HERNIA REPAIR N/A 05/12/2022   Procedure: XI ROBOTIC ASSISTED HIATAL HERNIA REPAIR;  Surgeon: Corliss Skains, MD;  Location: [facility] OR;  Service: Thoracic;  Laterality: N/A;   XI ROBOTIC ASSISTED PARAESOPHAGEAL HERNIA REPAIR N/A 01/18/2021   Procedure: XI ROBOTIC ASSISTED PARAESOPHAGEAL HERNIA REPAIR WITH FUNDOPLICATION WITH MESH;  Surgeon: Corliss Skains, MD;  Location: [facility] OR;  Service: Thoracic;  Laterality: N/A;   Family History  Problem Relation Age of Onset   Heart attack Mother    CAD Mother    Heart disease Mother        Before age 56   Hypertension Mother    Hyperlipidemia Mother    Hypertension Father    Pneumonia Father    Hyperlipidemia Father    Heart murmur Sister    Osteoporosis Paternal Grandmother    CAD Paternal Grandfather    Breast cancer Neg Hx    Colon cancer Neg Hx    Esophageal cancer Neg Hx    Rectal cancer Neg Hx    Stomach cancer Neg Hx    Colon polyps Neg Hx    Pancreatic cancer Neg Hx    Social History  Socioeconomic History   Marital status: Widowed    Spouse name: Not on file   Number of children: 1   Years of education: Not on file   Highest education level: 12th grade  Occupational History   Not on file  Tobacco Use   Smoking status: Former    Current packs/day: 0.00    Average packs/day: 1.3 packs/day for 38.0 years (47.5 ttl pk-yrs)    Types:  Cigarettes    Start date: 09/26/1969    Quit date: 09/27/2007    Years since quitting: 16.0    Passive exposure: Past   Smokeless tobacco: Never  Vaping Use   Vaping status: Never Used  Substance and Sexual Activity   Alcohol use: No   Drug use: No   Sexual activity: Not Currently  Other Topics Concern   Not on file  Social History Narrative   Widowed as of 2014/11/18 (husband died of viral illness while out of town in [place], sudden), has one daughter.   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   Tobacco: 20 pack-yr hx, quit 2007/11/19.   Alcohol: rare.   No hx of alc or drug problems.   Exercise: walks 2.54miles on treadmill daily.   Social Drivers of Corporate investment banker Strain: Low Risk  (10/18/2023)   Overall Financial Resource Strain (CARDIA)    Difficulty of Paying Living Expenses: Not hard at all  Food Insecurity: No Food Insecurity (10/18/2023)   Hunger Vital Sign    Worried About Running Out of Food in the Last Year: Never true    Ran Out of Food in the Last Year: Never true  Transportation Needs: No Transportation Needs (10/18/2023)   PRAPARE - Administrator, Civil Service (Medical): No    Lack of Transportation (Non-Medical): No  Physical Activity: Sufficiently Active (10/18/2023)   Exercise Vital Sign    Days of Exercise per Week: 5 days    Minutes of Exercise per Session: 50 min  Stress: No Stress Concern Present (10/18/2023)   [employer] of Occupational Health - Occupational Stress Questionnaire    Feeling of Stress : Not at all  Social Connections: Moderately Isolated (10/18/2023)   Social Connection and Isolation Panel [NHANES]    Frequency of Communication with Friends and Family: Three times a week    Frequency of Social Gatherings with Friends and Family: Three times a week    Attends Religious Services: More than 4 times per year    Active Member of Clubs or Organizations: No    Attends Tax inspector Meetings: Never    Marital Status: Widowed    Tobacco Counseling Counseling given: Not Answered   Clinical Intake:  Pre-visit preparation completed: Yes  Pain : No/denies pain     Diabetes: No  How often do you need to have someone help you when you read instructions, pamphlets, or other written materials from your doctor or pharmacy?: 1 - Never  Interpreter Needed?: No  Information entered by :: Remi Haggard LPN   Activities of Daily Living    10/18/2023   11:37 AM  In your present state of health, do you have any difficulty performing the following activities:  Hearing? 0  Vision? 0  Difficulty concentrating or making decisions? 0  Walking or climbing stairs? 0  Dressing or bathing? 0  Doing errands, shopping? 0  Preparing Food and eating ? N  Using the Toilet? N  In the past six months, have you accidently leaked urine? N  Do you have problems with loss of bowel control? N  Managing your Medications? N  Managing your Finances? N  Housekeeping or managing your Housekeeping? N    Patient Care Team: Jeoffrey Massed, MD as PCP - General (Family Medicine) Corky Crafts, MD as PCP - Cardiology (Cardiology) Nada Libman, MD as Consulting Physician (Vascular Surgery) Dominica Severin, MD as Consulting Physician (Orthopedic Surgery) Janalyn Harder, MD (Inactive) as Consulting Physician (Dermatology) Armbruster, Willaim Rayas, MD as Consulting Physician (Gastroenterology) Berna Bue, MD as Consulting Physician (General Surgery) Lorin Glass, MD as Consulting Physician (Pulmonary Disease) Shellia Cleverly, DO as Consulting Physician (Gastroenterology) Jannifer Hick, MD as Consulting Physician (Urology) Kalman Shan, MD as Consulting Physician (Pulmonary Disease)  Indicate any recent Medical Services you may have received from other than [facility] providers in the past year (date may be approximate).     Assessment:   This is a  routine wellness examination for Desare.  Hearing/Vision screen Hearing Screening - Comments:: No trouble hearing Vision Screening - Comments:: Up to date [facility]   Goals Addressed             This Visit's Progress    Patient Stated       Continue current lifestyle       Depression Screen    10/18/2023   11:42 AM 08/09/2023    8:05 AM 03/03/2023    8:52 AM 10/12/2022   10:58 AM 10/05/2022    2:36 PM 04/06/2022   11:12 AM 02/09/2022    8:11 AM  PHQ 2/9 Scores  PHQ - 2 Score 0 0 0 0 0 0 0  PHQ- 9 Score 0          Fall Risk    10/18/2023   11:36 AM 08/09/2023    8:05 AM 03/03/2023    8:52 AM 02/27/2023    8:45 AM 10/12/2022   11:00 AM  Fall Risk   Falls in the past year? 0 0 0 0 0  Number falls in past yr: 0  0  0  Injury with Fall? 0  0  0  Risk for fall due to :   No Fall Risks  No Fall Risks  Follow up Falls evaluation completed;Education provided;Falls prevention discussed Falls evaluation completed Falls evaluation completed  Falls evaluation completed    MEDICARE RISK AT HOME: Medicare Risk at Home Any stairs in or around the home?: No If so, are there any without handrails?: No Home free of loose throw rugs in walkways, pet beds, electrical cords, etc?: Yes Adequate lighting in your home to reduce risk of falls?: Yes Life alert?: No Use of a cane, walker or w/c?: No Grab bars in the bathroom?: Yes Shower chair or bench in shower?: No Elevated toilet seat or a handicapped toilet?: No  TIMED UP AND GO:  Was the test performed?  No    Cognitive Function:        10/18/2023   11:39 AM 10/12/2022   10:30 AM 10/06/2021    2:40 PM  6CIT Screen  What Year? 0 points 0 points 0 points  What month? 0 points 0 points 0 points  What time? 0 points 0 points 0 points  Count back from 20 0 points 0 points 0 points  Months in reverse 0 points 0 points 0 points  Repeat phrase 0 points 0 points 0 points  Total Score 0  points 0 points 0 points     Immunizations Immunization History  Administered Date(s) Administered   Fluad Quad(high Dose 65+) 06/11/2019, 06/27/2022, 07/03/2023   Hepatitis A, Adult 04/04/2023, 10/10/2023   Hepb-cpg 04/04/2023, 05/09/2023   Influenza, High Dose Seasonal PF 05/23/2018, 07/21/2020   Influenza-Unspecified 07/01/2021   Moderna Covid-19 Vaccine Bivalent Booster 61yrs & up 07/22/2021   Moderna Sars-Covid-2 Vaccination 10/23/2019, 11/20/2019, 07/21/2020   Pfizer Covid-19 Vaccine Bivalent Booster 73yrs & up 06/27/2022   Pneumococcal Conjugate-13 05/23/2016   Pneumococcal Polysaccharide-23 05/24/2017   Respiratory Syncytial Virus Vaccine,Recomb Aduvanted(Arexvy) 07/15/2022   Tdap 02/22/2015   Zoster Recombinant(Shingrix) 07/11/2018, 10/18/2018    TDAP status: Up to date  Flu Vaccine status: Up to date  Pneumococcal vaccine status: Up to date  Covid-19 vaccine status: Information provided on how to obtain vaccines.   Qualifies for Shingles Vaccine? No   Zostavax completed Yes   Shingrix Completed?: Yes  Screening Tests Health Maintenance  Topic Date Due   COVID-19 Vaccine (6 - 2024-25 season) 05/28/2023   MAMMOGRAM  09/12/2024   Colonoscopy  09/22/2024   Medicare Annual Wellness (AWV)  10/17/2024   DTaP/Tdap/Td (2 - Td or Tdap) 02/21/2025   Pneumonia Vaccine 49+ Years old  Completed   INFLUENZA VACCINE  Completed   DEXA SCAN  Completed   Hepatitis C Screening  Completed   Zoster Vaccines- Shingrix  Completed   HPV VACCINES  Aged Out   Lung Cancer Screening  Discontinued   Fecal DNA (Cologuard)  Discontinued    Health Maintenance  Health Maintenance Due  Topic Date Due   COVID-19 Vaccine (6 - 2024-25 season) 05/28/2023    Colorectal cancer screening: Type of screening: Colonoscopy. Completed 2020. Repeat every 5 years  Mammogram status: Completed  . Repeat every year  Bone Density status: Completed 2024. Results reflect: Bone density results: OSTEOPENIA. Repeat every 2  years.  Lung Cancer Screening: (Low Dose CT Chest recommended if Age 11-80 years, 20 pack-year currently smoking OR have quit w/in 15years   does qualify  Lung Cancer Screening Referral:   completed 2024  Additional Screening:  Hepatitis C Screening: does not qualify; Completed 2024  Vision Screening: Recommended annual ophthalmology exams for early detection of glaucoma and other disorders of the eye. Is the patient up to date with their annual eye exam?  Yes  Who is the provider or what is the name of the office in which the patient attends annual eye exams? [facility] If pt is not established with a provider, would they like to be referred to a provider to establish care? No .   Dental Screening: Recommended annual dental exams for proper oral hygiene     Community Resource Referral / Chronic Care Management: CRR required this visit?  No   CCM required this visit?  No     Plan:     I have personally reviewed and noted the following in the patient's chart:   Medical and social history Use of alcohol, tobacco or illicit drugs  Current medications and supplements including opioid prescriptions. Patient is not currently taking opioid prescriptions. Functional ability and status Nutritional status Physical activity Advanced directives List of other physicians Hospitalizations, surgeries, and ER visits in previous 12 months Vitals Screenings to include cognitive, depression, and falls Referrals and appointments  In addition, I have reviewed and discussed with patient certain preventive protocols, quality metrics, and best practice recommendations. A written personalized care plan for preventive services as well as general preventive health recommendations were  provided to patient.     Remi Haggard, LPN   [id number]   After Visit Summary: (MyChart) Due to this being a telephonic visit, the after visit summary with patients personalized plan was offered to patient via MyChart    Nurse Notes:

## 2023-10-20 ENCOUNTER — Encounter: Payer: Self-pay | Admitting: Rheumatology

## 2023-10-20 ENCOUNTER — Ambulatory Visit: Payer: PPO | Attending: Rheumatology | Admitting: Rheumatology

## 2023-10-20 VITALS — BP 108/74 | HR 84 | Resp 13 | Ht 64.5 in | Wt 132.0 lb

## 2023-10-20 DIAGNOSIS — M47816 Spondylosis without myelopathy or radiculopathy, lumbar region: Secondary | ICD-10-CM

## 2023-10-20 DIAGNOSIS — M8589 Other specified disorders of bone density and structure, multiple sites: Secondary | ICD-10-CM | POA: Diagnosis not present

## 2023-10-20 DIAGNOSIS — G8929 Other chronic pain: Secondary | ICD-10-CM | POA: Diagnosis not present

## 2023-10-20 DIAGNOSIS — E782 Mixed hyperlipidemia: Secondary | ICD-10-CM | POA: Diagnosis not present

## 2023-10-20 DIAGNOSIS — J438 Other emphysema: Secondary | ICD-10-CM

## 2023-10-20 DIAGNOSIS — M546 Pain in thoracic spine: Secondary | ICD-10-CM

## 2023-10-20 DIAGNOSIS — I7300 Raynaud's syndrome without gangrene: Secondary | ICD-10-CM

## 2023-10-20 DIAGNOSIS — Z9889 Other specified postprocedural states: Secondary | ICD-10-CM | POA: Diagnosis not present

## 2023-10-20 DIAGNOSIS — I10 Essential (primary) hypertension: Secondary | ICD-10-CM

## 2023-10-20 DIAGNOSIS — I739 Peripheral vascular disease, unspecified: Secondary | ICD-10-CM | POA: Diagnosis not present

## 2023-10-20 DIAGNOSIS — I779 Disorder of arteries and arterioles, unspecified: Secondary | ICD-10-CM | POA: Diagnosis not present

## 2023-10-20 DIAGNOSIS — Z87891 Personal history of nicotine dependence: Secondary | ICD-10-CM

## 2023-10-20 NOTE — Patient Instructions (Signed)
Raynaud's Phenomenon  Raynaud's phenomenon is a condition that affects the blood vessels (arteries) that carry blood to the fingers and toes. The arteries that supply blood to the ears, lips, nipples, or the tip of the nose might also be affected. Raynaud's phenomenon causes the arteries to become narrow temporarily (spasm). As a result, the flow of blood to the affected areas is temporarily decreased. This usually occurs in response to cold temperatures or stress. During an attack, the skin in the affected areas turns white, then blue, and finally red. A person may also feel tingling or numbness in those areas. Attacks usually last for only a brief period, and then the blood flow to the area returns to normal. In most cases, Raynaud's phenomenon does not cause serious health problems. What are the causes? In many cases, the cause of this condition is not known. The condition may occur on its own (primary Raynaud's phenomenon) or may be associated with other diseases or factors (secondary Raynaud's phenomenon). Possible causes may include: Diseases or medical conditions that damage the arteries. Injuries and repetitive actions that hurt the hands or feet. Being exposed to certain chemicals. Taking medicines that narrow the arteries. Other medical conditions, such as lupus, scleroderma, rheumatoid arthritis, thyroid problems, blood disorders, Sjogren syndrome, or atherosclerosis. What increases the risk? The following factors may make you more likely to develop this condition: Being 18-78 years old. Being female. Having a family history of Raynaud's phenomenon. Living in a cold climate. Smoking. What are the signs or symptoms? Symptoms of this condition usually occur when you are exposed to cold temperatures or when you have emotional stress. The symptoms may last for a few minutes or up to several hours. They usually affect your fingers but may also affect your toes, nipples, lips, ears, or the  tip of your nose. Symptoms may include: Changes in skin color. The skin in the affected areas will turn pale or white. The skin may then change from white to bluish to red as normal blood flow returns to the area. Numbness, tingling, or pain in the affected areas. In severe cases, symptoms may include: Skin sores. Tissues decaying and dying (gangrene). How is this diagnosed? This condition may be diagnosed based on: Your symptoms and medical history. A physical exam. During the exam, you may be asked to put your hands in cold water to check for a reaction to cold temperature. Tests, such as: Blood tests to check for other diseases or conditions. A test to check the movement of blood through your arteries and veins (vascular ultrasound). A test in which the skin at the base of your fingernail is examined under a microscope (nailfold capillaroscopy). How is this treated? During an episode, you can take actions to help symptoms go away faster. Options include moving your arms around in a windmill pattern, warming your fingers under warm water, or placing your fingers in a warm body fold, such as your armpit. Long-term treatment for this condition often involves making lifestyle changes and taking steps to control your exposure to cold temperature. For more severe cases, medicine (calcium channel blockers) may be used to improve blood circulation. Follow these instructions at home: Avoiding cold temperatures Take these steps to avoid exposure to cold: If possible, stay indoors during cold weather. When you go outside during cold weather, dress in layers and wear mittens, a hat, a scarf, and warm footwear. Wear mittens or gloves when handling ice or frozen food. Use holders for glasses or cans containing  cold drinks. Let warm water run for a while before taking a shower or bath. Warm up the car before driving in cold weather. Lifestyle If possible, avoid stressful and emotional situations. Try  to find ways to manage your stress, such as: Exercise. Yoga. Meditation. Biofeedback. Do not use any products that contain nicotine or tobacco. These products include cigarettes, chewing tobacco, and vaping devices, such as e-cigarettes. If you need help quitting, ask your health care provider. Avoid secondhand smoke. Limit your use of caffeine. Switch to decaffeinated coffee, tea, and soda. Avoid chocolate. Avoid vibrating tools and machinery. General instructions Protect your hands and feet from injuries, cuts, or bruises. Avoid wearing tight rings or wristbands. Wear loose fitting socks and comfortable, roomy shoes. Take over-the-counter and prescription medicines only as told by your health care provider. Where to find support Raynaud's Association: www.raynauds.org Where to find more information [employer] of Arthritis and Musculoskeletal and Skin Diseases: www.niams.http://www.myers.net/ Contact a health care provider if: Your discomfort becomes worse despite lifestyle changes. You develop sores on your fingers or toes that do not heal. You have breaks in the skin on your fingers or toes. You have a fever. You have pain or swelling in your joints. You have a rash. Your symptoms occur on only one side of your body. Get help right away if: Your fingers or toes turn black. You have severe pain in the affected areas. These symptoms may represent a serious problem that is an emergency. Do not wait to see if the symptoms will go away. Get medical help right away. Call your local emergency services (911 in the [place] Do not drive yourself to the hospital. Summary Raynaud's phenomenon is a condition that affects the arteries that carry blood to the fingers, toes, ears, lips, nipples, or the tip of the nose. In many cases, the cause of this condition is not known. Symptoms of this condition include changes in skin color along with numbness and tingling in the affected area. Treatment for  this condition includes lifestyle changes and reducing exposure to cold temperatures. Medicines may be used for severe cases of the condition. Contact your health care provider if your condition worsens despite treatment. This information is not intended to replace advice given to you by your health care provider. Make sure you discuss any questions you have with your health care provider. Document Revised: 11/17/2020 Document Reviewed: 11/17/2020 Elsevier Patient Education  2024 [employer].

## 2023-11-03 ENCOUNTER — Other Ambulatory Visit: Payer: Self-pay | Admitting: Gastroenterology

## 2023-11-11 ENCOUNTER — Other Ambulatory Visit: Payer: Self-pay | Admitting: Family Medicine

## 2023-11-12 ENCOUNTER — Encounter: Payer: Self-pay | Admitting: Family Medicine

## 2023-11-13 MED ORDER — FLUTICASONE PROPIONATE 50 MCG/ACT NA SUSP: 2.0000 | Freq: Every day | NASAL | 1 refills | Status: DC

## 2023-11-15 ENCOUNTER — Other Ambulatory Visit: Payer: Self-pay | Admitting: Gastroenterology

## 2023-11-22 DIAGNOSIS — K449 Diaphragmatic hernia without obstruction or gangrene: Secondary | ICD-10-CM | POA: Diagnosis not present

## 2023-11-22 DIAGNOSIS — K2289 Other specified disease of esophagus: Secondary | ICD-10-CM | POA: Diagnosis not present

## 2023-11-22 DIAGNOSIS — Z9889 Other specified postprocedural states: Secondary | ICD-10-CM | POA: Diagnosis not present

## 2023-11-22 DIAGNOSIS — K3184 Gastroparesis: Secondary | ICD-10-CM | POA: Diagnosis not present

## 2023-11-22 DIAGNOSIS — K219 Gastro-esophageal reflux disease without esophagitis: Secondary | ICD-10-CM | POA: Diagnosis not present

## 2023-12-04 ENCOUNTER — Other Ambulatory Visit: Payer: Self-pay | Admitting: Family Medicine

## 2023-12-04 ENCOUNTER — Other Ambulatory Visit: Payer: Self-pay | Admitting: Gastroenterology

## 2023-12-10 ENCOUNTER — Encounter: Payer: Self-pay | Admitting: Family Medicine

## 2023-12-19 DIAGNOSIS — R110 Nausea: Secondary | ICD-10-CM | POA: Diagnosis not present

## 2023-12-19 DIAGNOSIS — K5909 Other constipation: Secondary | ICD-10-CM | POA: Diagnosis not present

## 2023-12-19 DIAGNOSIS — K3184 Gastroparesis: Secondary | ICD-10-CM | POA: Diagnosis not present

## 2023-12-26 DIAGNOSIS — N3281 Overactive bladder: Secondary | ICD-10-CM | POA: Diagnosis not present

## 2023-12-26 DIAGNOSIS — R351 Nocturia: Secondary | ICD-10-CM | POA: Diagnosis not present

## 2023-12-26 DIAGNOSIS — R3915 Urgency of urination: Secondary | ICD-10-CM | POA: Diagnosis not present

## 2023-12-27 DIAGNOSIS — H40013 Open angle with borderline findings, low risk, bilateral: Secondary | ICD-10-CM | POA: Diagnosis not present

## 2023-12-27 DIAGNOSIS — H04123 Dry eye syndrome of bilateral lacrimal glands: Secondary | ICD-10-CM | POA: Diagnosis not present

## 2023-12-27 DIAGNOSIS — H43813 Vitreous degeneration, bilateral: Secondary | ICD-10-CM | POA: Diagnosis not present

## 2023-12-27 DIAGNOSIS — H18513 Endothelial corneal dystrophy, bilateral: Secondary | ICD-10-CM | POA: Diagnosis not present

## 2023-12-28 ENCOUNTER — Other Ambulatory Visit: Payer: Self-pay | Admitting: Gastroenterology

## 2024-01-05 ENCOUNTER — Other Ambulatory Visit: Payer: Self-pay | Admitting: Family Medicine

## 2024-01-08 ENCOUNTER — Encounter: Payer: Self-pay | Admitting: Family Medicine

## 2024-01-09 NOTE — Telephone Encounter (Signed)
 Pt was contacted and rescheduled for later time that day

## 2024-01-28 ENCOUNTER — Encounter (HOSPITAL_COMMUNITY): Payer: Self-pay

## 2024-01-28 ENCOUNTER — Emergency Department (HOSPITAL_COMMUNITY)

## 2024-01-28 ENCOUNTER — Other Ambulatory Visit: Payer: Self-pay

## 2024-01-28 ENCOUNTER — Emergency Department (HOSPITAL_COMMUNITY)
Admission: EM | Admit: 2024-01-28 | Discharge: 2024-01-28 | Disposition: A | Discharge status: Home / Self Care | Attending: Emergency Medicine | Admitting: Emergency Medicine

## 2024-01-28 DIAGNOSIS — R112 Nausea with vomiting, unspecified: Secondary | ICD-10-CM

## 2024-01-28 DIAGNOSIS — R945 Abnormal results of liver function studies: Secondary | ICD-10-CM | POA: Insufficient documentation

## 2024-01-28 DIAGNOSIS — M542 Cervicalgia: Secondary | ICD-10-CM | POA: Insufficient documentation

## 2024-01-28 DIAGNOSIS — H5711 Ocular pain, right eye: Secondary | ICD-10-CM | POA: Diagnosis not present

## 2024-01-28 DIAGNOSIS — N281 Cyst of kidney, acquired: Secondary | ICD-10-CM | POA: Diagnosis not present

## 2024-01-28 DIAGNOSIS — I959 Hypotension, unspecified: Secondary | ICD-10-CM | POA: Diagnosis not present

## 2024-01-28 DIAGNOSIS — I6523 Occlusion and stenosis of bilateral carotid arteries: Secondary | ICD-10-CM | POA: Diagnosis not present

## 2024-01-28 DIAGNOSIS — R531 Weakness: Secondary | ICD-10-CM | POA: Diagnosis not present

## 2024-01-28 DIAGNOSIS — Z8679 Personal history of other diseases of the circulatory system: Secondary | ICD-10-CM

## 2024-01-28 DIAGNOSIS — N1330 Unspecified hydronephrosis: Secondary | ICD-10-CM | POA: Diagnosis not present

## 2024-01-28 DIAGNOSIS — R519 Headache, unspecified: Secondary | ICD-10-CM | POA: Diagnosis not present

## 2024-01-28 DIAGNOSIS — R110 Nausea: Secondary | ICD-10-CM | POA: Diagnosis not present

## 2024-01-28 DIAGNOSIS — G4489 Other headache syndrome: Secondary | ICD-10-CM | POA: Diagnosis not present

## 2024-01-28 DIAGNOSIS — R109 Unspecified abdominal pain: Secondary | ICD-10-CM | POA: Insufficient documentation

## 2024-01-28 DIAGNOSIS — R0689 Other abnormalities of breathing: Secondary | ICD-10-CM | POA: Diagnosis not present

## 2024-01-28 DIAGNOSIS — I708 Atherosclerosis of other arteries: Secondary | ICD-10-CM | POA: Diagnosis not present

## 2024-01-28 DIAGNOSIS — I672 Cerebral atherosclerosis: Secondary | ICD-10-CM | POA: Diagnosis not present

## 2024-01-28 DIAGNOSIS — I10 Essential (primary) hypertension: Secondary | ICD-10-CM | POA: Diagnosis not present

## 2024-01-28 DIAGNOSIS — K5750 Diverticulosis of both small and large intestine without perforation or abscess without bleeding: Secondary | ICD-10-CM | POA: Diagnosis not present

## 2024-01-28 DIAGNOSIS — R7989 Other specified abnormal findings of blood chemistry: Secondary | ICD-10-CM

## 2024-01-28 LAB — COMPREHENSIVE METABOLIC PANEL WITH GFR
ALT: 78 U/L — ABNORMAL HIGH (ref 0–44)
AST: 61 U/L — ABNORMAL HIGH (ref 15–41)
Albumin: 3.9 g/dL (ref 3.5–5.0)
Alkaline Phosphatase: 124 U/L (ref 38–126)
Anion gap: 8 (ref 5–15)
BUN: 18 mg/dL (ref 8–23)
CO2: 23 mmol/L (ref 22–32)
Calcium: 9.3 mg/dL (ref 8.9–10.3)
Chloride: 107 mmol/L (ref 98–111)
Creatinine, Ser: 0.67 mg/dL (ref 0.44–1.00)
GFR, Estimated: >60 mL/min (ref >60)
Glucose, Bld: 122 mg/dL — ABNORMAL HIGH (ref 70–99)
Potassium: 3.8 mmol/L (ref 3.5–5.1)
Sodium: 138 mmol/L (ref 135–145)
Total Bilirubin: 1.7 mg/dL — ABNORMAL HIGH (ref 0.0–1.2)
Total Protein: 6.8 g/dL (ref 6.5–8.1)

## 2024-01-28 LAB — DIFFERENTIAL
Abs Immature Granulocytes: 0.03 10*3/uL (ref 0.00–0.07)
Basophils Absolute: 0 10*3/uL (ref 0.0–0.1)
Basophils Relative: 1 %
Eosinophils Absolute: 0.2 10*3/uL (ref 0.0–0.5)
Eosinophils Relative: 2 %
Immature Granulocytes: 0 %
Lymphocytes Relative: 19 %
Lymphs Abs: 1.6 10*3/uL (ref 0.7–4.0)
Monocytes Absolute: 0.6 10*3/uL (ref 0.1–1.0)
Monocytes Relative: 7 %
Neutro Abs: 6.1 10*3/uL (ref 1.7–7.7)
Neutrophils Relative %: 71 %

## 2024-01-28 LAB — CBC
HCT: 45.4 % (ref 36.0–46.0)
Hemoglobin: 15.4 g/dL — ABNORMAL HIGH (ref 12.0–15.0)
MCH: 31.4 pg (ref 26.0–34.0)
MCHC: 33.9 g/dL (ref 30.0–36.0)
MCV: 92.7 fL (ref 80.0–100.0)
Platelets: 213 10*3/uL (ref 150–400)
RBC: 4.9 MIL/uL (ref 3.87–5.11)
RDW: 13.2 % (ref 11.5–15.5)
WBC: 8.5 10*3/uL (ref 4.0–10.5)
nRBC: 0 % (ref 0.0–0.2)

## 2024-01-28 LAB — PROTIME-INR
INR: 1 (ref 0.8–1.2)
Prothrombin Time: 13.2 s (ref 11.4–15.2)

## 2024-01-28 LAB — APTT: aPTT: 25 s (ref 24–36)

## 2024-01-28 MED ORDER — LACTATED RINGERS IV BOLUS
1000.0000 mL | Freq: Once | INTRAVENOUS | Status: AC
  Administered 2024-01-28: 1000 mL via INTRAVENOUS

## 2024-01-28 MED ORDER — IOHEXOL 300 MG/ML  SOLN
100.0000 mL | Freq: Once | INTRAMUSCULAR | Status: AC | PRN
  Administered 2024-01-28: 100 mL via INTRAVENOUS

## 2024-01-28 MED ORDER — ONDANSETRON HCL 4 MG/2ML IJ SOLN
4.0000 mg | Freq: Once | INTRAMUSCULAR | Status: AC
  Administered 2024-01-28: 4 mg via INTRAVENOUS
  Filled 2024-01-28: qty 2

## 2024-01-28 MED ORDER — METOCLOPRAMIDE HCL 5 MG/ML IJ SOLN
10.0000 mg | Freq: Once | INTRAMUSCULAR | Status: AC
  Administered 2024-01-28: 10 mg via INTRAVENOUS
  Filled 2024-01-28: qty 2

## 2024-01-28 MED ORDER — KETOROLAC TROMETHAMINE 15 MG/ML IJ SOLN
15.0000 mg | Freq: Once | INTRAMUSCULAR | Status: AC
  Administered 2024-01-28: 15 mg via INTRAVENOUS
  Filled 2024-01-28: qty 1

## 2024-01-28 MED ORDER — IOHEXOL 350 MG/ML SOLN
75.0000 mL | Freq: Once | INTRAVENOUS | Status: AC | PRN
  Administered 2024-01-28: 75 mL via INTRAVENOUS

## 2024-01-28 MED ORDER — DIPHENHYDRAMINE HCL 50 MG/ML IJ SOLN
25.0000 mg | Freq: Once | INTRAMUSCULAR | Status: AC
  Administered 2024-01-28: 25 mg via INTRAVENOUS
  Filled 2024-01-28: qty 1

## 2024-01-28 MED ORDER — PROCHLORPERAZINE EDISYLATE 10 MG/2ML IJ SOLN
10.0000 mg | Freq: Once | INTRAMUSCULAR | Status: AC
  Administered 2024-01-28: 10 mg via INTRAVENOUS
  Filled 2024-01-28: qty 2

## 2024-01-28 NOTE — ED Provider Notes (Signed)
 Signed out that d/c plan/instructions complete - to d/c to home if/when CT abd and MRI brain neg for acute process.   CT abd/pelvis neg for acute process. MRI neg for acute process. No recurrent emesis. Vitals normal.   Pt currently appears stable for d/c per Dr Alvino Aye plan.    Guadalupe Lee, MD 01/28/24 [phone]

## 2024-01-28 NOTE — ED Triage Notes (Addendum)
 Pt from home complains of Headache that starts at back of head and radiates to R eye. Hx of TIAs, carotid blockages, Stents placed. LNK 1015- headache, per ems NIHSS 0, weakness noted. Takes blood thinners. Endorses vomiting  Pt AAOx4

## 2024-01-28 NOTE — ED Provider Notes (Signed)
 [place] EMERGENCY DEPARTMENT AT [facility] Provider Note   CSN: [id number] Arrival date & time: 01/28/24  1129     History {Add pertinent medical, surgical, social history, OB history to HPI:1} Chief Complaint  Patient presents with   Headache   Nausea   Emesis    [name] is a 73 y.o. female.  HPI     Please see the medical decision making below  Home Medications Prior to Admission medications   Medication Sig Start Date End Date Taking? Authorizing Provider  acetaminophen  (TYLENOL ) 325 MG tablet Take 2 tablets (650 mg total) by mouth every 6 (six) hours as needed for mild pain or moderate pain. 05/13/22  Yes Roddenberry, Myron G, PA-C  albuterol  (VENTOLIN  HFA) 108 (90 Base) MCG/ACT inhaler Inhale 2 puffs into the lungs every 6 (six) hours as needed for wheezing or shortness of breath. 11/09/20  Yes Icard, Lucie Ruts, DO  atorvastatin  (LIPITOR) 80 MG tablet Take 1 tablet (80 mg total) by mouth daily. DUE FOR APPT MAY 12/04/23  Yes McGowen, Minetta Aly, MD  Calcium -Magnesium-Vitamin D (CALCIUM  1200+D3 PO) Take 1 tablet by mouth daily.   Yes [provider]  cephALEXin (KEFLEX) 500 MG capsule Take 500 mg by mouth 2 (two) times daily. 01/10/24 01/28/24 Yes [provider]  clopidogrel  (PLAVIX ) 75 MG tablet Take 1 tablet (75 mg total) by mouth daily. 08/09/23  Yes McGowen, Minetta Aly, MD  diazepam  (VALIUM ) 5 MG tablet 1 tab po bid as needed for anxiety Patient taking differently: Take 5 mg by mouth at bedtime. 1 tab po bid as needed for anxiety 08/01/23  Yes McGowen, Minetta Aly, MD  estradiol (ESTRACE) 0.1 MG/GM vaginal cream Place 1 Applicatorful vaginally 2 (two) times a week. 05/27/20  Yes [provider]  ezetimibe  (ZETIA ) 10 MG tablet Take 1 tablet (10 mg total) by mouth daily. DUE FOR APPT MAY 12/04/23  Yes McGowen, Minetta Aly, MD  famotidine  (PEPCID ) 40 MG tablet Take 1 tablet (40 mg total) by mouth daily. 08/09/23  Yes McGowen, Minetta Aly, MD   fexofenadine (ALLEGRA) 180 MG tablet Take 180 mg by mouth daily as needed for allergies.   Yes [provider]  fluticasone  (FLONASE ) 50 MCG/ACT nasal spray SPRAY 2 SPRAYS INTO EACH NOSTRIL EVERY DAY 01/05/24  Yes McGowen, Minetta Aly, MD  gabapentin  (NEURONTIN ) 300 MG capsule Take 1 capsule (300 mg total) by mouth at bedtime. 09/08/23  Yes McGowen, Minetta Aly, MD  Multiple Vitamins-Minerals (CENTRUM SILVER 50+WOMEN) TABS Take 1 tablet by mouth daily.   Yes [provider]  pantoprazole  (PROTONIX ) 40 MG tablet Take 1 tablet (40 mg total) by mouth daily. Patient needs follow up appointment for future refills. Please call [phone] to schedule an appointment. 12/28/23  Yes Cirigliano, Vito V, DO  Probiotic Product (PROBIOTIC DAILY PO) Take 1 capsule by mouth daily.   Yes [provider]  prochlorperazine  (COMPAZINE ) 10 MG tablet Take 10 mg by mouth 2 (two) times daily as needed for nausea or vomiting.   Yes [provider]  Prucalopride Succinate  (MOTEGRITY ) 2 MG TABS Take 1 tablet (2 mg total) by mouth daily. 09/08/23 03/06/24 Yes Cirigliano, Vito V, DO  tolterodine (DETROL LA) 4 MG 24 hr capsule Take 4 mg by mouth daily. 05/27/20  Yes [provider]  dicyclomine (BENTYL) 10 MG capsule Take 10 mg by mouth 4 (four) times daily as needed for spasms. 08/09/23   [provider]  nitroGLYCERIN  (NITROSTAT ) 0.4 MG SL  tablet Place 1 tablet (0.4 mg total) under the tongue every 5 (five) minutes as needed for chest pain. 05/12/20   Lucendia Rusk, MD      Allergies    Doxycycline , Breo ellipta  [fluticasone  furoate-vilanterol], Demerol hcl [meperidine], Fentanyl , Fluocinolone, Imdur  [isosorbide  nitrate], and Penicillins    Review of Systems   Review of Systems  Physical Exam Updated Vital Signs BP 127/64   Pulse 84   Resp 20   Ht 5' 4.5" (1.638 m)   Wt 59.9 kg   SpO2 92%   BMI 22.32 kg/m  Physical Exam  ED Results / Procedures / Treatments    Labs (all labs ordered are listed, but only abnormal results are displayed) Labs Reviewed  CBC - Abnormal; Notable for the following components:      Result Value   Hemoglobin 15.4 (*)    All other components within normal limits  COMPREHENSIVE METABOLIC PANEL WITH GFR - Abnormal; Notable for the following components:   Glucose, Bld 122 (*)    AST 61 (*)    ALT 78 (*)    Total Bilirubin 1.7 (*)    All other components within normal limits  PROTIME-INR  APTT  DIFFERENTIAL  RAPID URINE DRUG SCREEN, HOSP PERFORMED  I-STAT CHEM 8, ED    EKG EKG Interpretation Date/Time:  Sunday Jan 28 2024 11:49:24 EDT Ventricular Rate:  83 PR Interval:  135 QRS Duration:  87 QT Interval:  387 QTC Calculation: 455 R Axis:   88  Text Interpretation: Sinus rhythm Borderline right axis deviation Low voltage, precordial leads No acute changes No significant change since last tracing Confirmed by Deatra Face [phone]) on 01/28/2024 1:29:55 PM  Radiology CT ANGIO HEAD NECK W WO CM Result Date: 01/28/2024 CLINICAL DATA:  73 year old female headache, neurologic deficit, pain radiating from the back of the head to the right eye. History of atherosclerosis, carotid endarterectomy, proximal great vessel stenting. EXAM: CT ANGIOGRAPHY HEAD AND NECK WITH AND WITHOUT CONTRAST TECHNIQUE: Multidetector CT imaging of the head and neck was performed using the standard protocol during bolus administration of intravenous contrast. Multiplanar CT image reconstructions and MIPs were obtained to evaluate the vascular anatomy. Carotid stenosis measurements (when applicable) are obtained utilizing NASCET criteria, using the distal internal carotid diameter as the denominator. RADIATION DOSE REDUCTION: This exam was performed according to the departmental dose-optimization program which includes automated exposure control, adjustment of the mA and/or kV according to patient size and/or use of iterative reconstruction technique.  CONTRAST:  75mL OMNIPAQUE  IOHEXOL  350 MG/ML SOLN COMPARISON:  Head CT 05/15/2020.  CTA neck 08/17/2012. FINDINGS: CT HEAD Brain: Cerebral volume remains normal for age. No midline shift, ventriculomegaly, mass effect, evidence of mass lesion, intracranial hemorrhage or evidence of cortically based acute infarction. Gray-white differentiation appears stable and within normal limits for age. Calvarium and skull base: Intact, negative. Chronic hyperostosis frontalis. Paranasal sinuses: Visualized paranasal sinuses and mastoids are stable and well aerated. Orbits: Postoperative changes to both globes now. No acute orbit or scalp soft tissue finding. CTA NECK Skeleton: Mild cervicothoracic scoliosis. Mild for age cervical spine degeneration superimposed. Osteopenia. Largely absent dentition. No acute osseous abnormality identified. Upper chest: Emphysema. Mild dependent atelectasis. Negative visible superior mediastinum. Other neck: Partially retropharyngeal carotids. Chronic left parotid gland atrophy, sialolithiasis. Nonvascular neck soft tissue spaces otherwise are within normal limits. Aortic arch: Calcified aortic atherosclerosis.  Three vessel arch. Right carotid system: Brachiocephalic origin stent remains patent, no in stent stenosis is evident. Mild post dent dilatation  of the vessel. Patent right CCA origin without significant plaque or stenosis. Right carotid endarterectomy. Mild superimposed atherosclerosis. No stenosis to the skull base. Left carotid system: Chronic left CCA origin stent appears patent without evidence of in stent stenosis. Tortuous left CCA with mild plaque before the left carotid bifurcation. Left ICA origin bulky calcified plaque resulting in stenosis numerically estimated at 72 % with respect to the distal vessel (series 15, image 130 and series 10, image 408. This is chronic, appears progressed since 2013. Left ICA remains patent to the skull base with mild additional extracranial  calcified plaque. Vertebral arteries: Right subclavian origin soft plaque with up to 50% stenosis. Right vertebral artery origin remains normal. Right vertebral artery is patent to the skull base with no significant plaque or stenosis. Proximal left subclavian artery soft plaque with estimated 60 % stenosis with respect to the distal vessel (series 15, image 118). Left vertebral origin soft and calcified plaque with mild to moderate stenosis. Codominant left vertebral remains patent to the skull base with no additional plaque or stenosis. CTA HEAD Posterior circulation: Patent distal vertebral arteries and vertebrobasilar junction without plaque or stenosis, right V4 is mildly dominant. Patent right PICA, left AICA origins. Patent basilar artery, diminutive. Patent SCA and right PCA origins. Fetal type left PCA origin. Small right posterior communicating artery. Bilateral PCA branches are within normal limits. Anterior circulation: Both ICA siphons are patent. Left siphon mild to moderate calcified plaque with only mild associated stenosis in the supraclinoid segment. Normal left posterior communicating artery origin. Right siphon similar mild to moderate calcified plaque, no significant stenosis. Dominant right ICA siphon. Patent carotid termini. Dominant right and diminutive or absent left ACA A1 segments. Normal anterior communicating artery. Bilateral ACA branches are normal. Left MCA M1 segment is tortuous, bifurcation is patent without stenosis. Right MCA M1 segment and bifurcation are patent without stenosis. Bilateral MCA branches are within normal limits. Venous sinuses: Patent. Anatomic variants: Fetal type left PCA. Dominant right and diminutive or absent left ACA A1 segments. Review of the MIP images confirms the above findings IMPRESSION: 1. Negative for large vessel occlusion. Patent chronically stented origins of the brachiocephalic and left CCA with no adverse features. Chronic right carotid  endarterectomy with no adverse features. 2. Positive for intracranial and extracranial atherosclerosis: - bulky calcified plaque at the Left ICA origin with estimated 72% stenosis, progressed since 2013. - bilateral ICA siphon plaque without significant stenosis. - Bilateral proximal Subclavian artery plaque, 60% stenosis estimated on the Left, 50% on the Right. 3. No acute intracranial abnormality by CT. Stable and normal for age CT appearance of the brain. 4. Aortic Atherosclerosis (ICD10-I70.0) and Emphysema (ICD10-J43.9). Electronically Signed   By: Marlise Simpers M.D.   On: 01/28/2024 13:09    Procedures Procedures  {Document cardiac monitor, telemetry assessment procedure when appropriate:1}  Medications Ordered in ED Medications  ketorolac  (TORADOL ) 15 MG/ML injection 15 mg (has no administration in time range)  prochlorperazine  (COMPAZINE ) injection 10 mg (has no administration in time range)  diphenhydrAMINE  (BENADRYL ) injection 25 mg (has no administration in time range)  lactated ringers  bolus 1,000 mL (has no administration in time range)  ondansetron  (ZOFRAN ) injection 4 mg (4 mg Intravenous Given 01/28/24 1156)  metoCLOPramide  (REGLAN ) injection 10 mg (10 mg Intravenous Given 01/28/24 1237)  iohexol  (OMNIPAQUE ) 350 MG/ML injection 75 mL (75 mLs Intravenous Contrast Given 01/28/24 1219)    ED Course/ Medical Decision Making/ A&P   {   Click here for ABCD2,  HEART and other calculatorsREFRESH Note before signing :1}                              Medical Decision Making Amount and/or Complexity of Data Reviewed Labs: ordered. Radiology: ordered.  Risk Prescription drug management.   This patient presents to the ED with chief complaint(s) of sudden onset posterior headache, neck pain, nausea and vomiting with pertinent past medical history of carotid artery disease, TIA, peripheral vascular disease and metabolic syndrome.patient has overall reassuring neuroexam.  The complaint involves an  extensive differential diagnosis and also carries with it a high risk of complications and morbidity.    The differential diagnosis includes  Primary headaches - including migrainous headaches, cluster headaches, tension headaches. ICH Carotid dissection /vertebral dissection or aneurysm Tumor Vascular headaches AV malformation Brain aneurysm Muscular headaches  Low suspicion for temporal arteritis, meningitis, encephalitis.  A/P: Pt comes in with cc of headaches. Patient has reassuring neuroexam.  She does not have meningismus.  No fevers, chills, focal neurodeficits and she has no vision change or temporal pain.  Headache was sudden onset in nature.    The initial plan is to get CT angio head and neck.   We will start with basic labs, and give patient headache cocktail.   Additional history obtained: Additional history obtained from family Records reviewed  vascular records, medications  Independent labs interpretation:  The following labs were independently interpreted: CBC, antibiotic profile is reassuring  Independent visualization and interpretation of imaging: - I independently visualized the following imaging with scope of interpretation limited to determining acute life threatening conditions related to emergency care: CT angiogram of head and neck, which revealed no evidence of brain bleed.  Per radiologist, CT angiogram reveals no posterior circulation insufficiency.  Patient has worsening carotid artery disease.  Treatment and Reassessment: Results of the CT angiogram discussed with the patient and family.  Her overall exam is unchanged.  Patient states that her headache is slightly better and she has not had any emesis after second round of medicine.  However she is still nauseous, she still has mild headache.  Family indicated that patient has had hernia repairs and she was told by the surgeons that because of the surgery, she will not have emesis.  Patient has  gastroparesis, but will not have emesis normally.  They are questioning if there is anything to worry about the abdomen.  Patient's abdominal exam remains unchanged.  My plan is to discuss the case with neurology.  Consultation: - Consulted or discussed management/test interpretation with external professional: Dr. Cleone Dad, neurology.  She recommends getting MRI brain to rule out stroke. I will add CT abdomen and pelvis given patient's and family's concerns about the nausea and vomiting.  Patient's care has been signed out to incoming team.  Oral challenge has been initiated. For the nausea and vomiting, differential diagnosis will include ileus, small bowel obstruction.   Final Clinical Impression(s) / ED Diagnoses Final diagnoses:  None    Rx / DC Orders ED Discharge Orders     None

## 2024-01-28 NOTE — ED Notes (Signed)
 Patient ambulated to the restroom and she stated that she feels much better and the pain in her neck is resolved. Patient stated that she is ready to go home.

## 2024-01-28 NOTE — Discharge Instructions (Addendum)
 It was our pleasure to provide your ER care today - we hope that you feel better.  Drink plenty of fluids/stay well hydrated.   For recent symptoms, follow up closely with primary care doctor/GI doctor during the coming week. A couple of your liver function tests are mildly elevated - follow up with your primary care doctor this week, and discuss as relates your cholesterol medication.   For carotid disease, follow up with your vascular specialist in the next few weeks.   Return to ER if worse, new symptoms, fevers, persistent vomiting, worsening or severe abdominal pain, severe headache, chest pain, trouble breathing, or other concern.

## 2024-02-06 LAB — I-STAT CHEM 8, ED
BUN: 18 mg/dL (ref 8–23)
Calcium, Ion: 1.25 mmol/L (ref 1.15–1.40)
Chloride: 107 mmol/L (ref 98–111)
Creatinine, Ser: 0.7 mg/dL (ref 0.44–1.00)
Glucose, Bld: 121 mg/dL — ABNORMAL HIGH (ref 70–99)
HCT: 44 % (ref 36.0–46.0)
Hemoglobin: 15 g/dL (ref 12.0–15.0)
Potassium: 3.8 mmol/L (ref 3.5–5.1)
Sodium: 142 mmol/L (ref 135–145)
TCO2: 24 mmol/L (ref 22–32)

## 2024-02-08 ENCOUNTER — Encounter: Payer: Self-pay | Admitting: Family Medicine

## 2024-02-08 ENCOUNTER — Ambulatory Visit (INDEPENDENT_AMBULATORY_CARE_PROVIDER_SITE_OTHER): Admitting: Family Medicine

## 2024-02-08 ENCOUNTER — Ambulatory Visit: Payer: Self-pay | Admitting: Family Medicine

## 2024-02-08 ENCOUNTER — Ambulatory Visit: Admitting: Family Medicine

## 2024-02-08 VITALS — BP 118/80 | HR 73 | Ht 64.5 in | Wt 133.8 lb

## 2024-02-08 DIAGNOSIS — F411 Generalized anxiety disorder: Secondary | ICD-10-CM

## 2024-02-08 DIAGNOSIS — E7800 Pure hypercholesterolemia, unspecified: Secondary | ICD-10-CM | POA: Diagnosis not present

## 2024-02-08 DIAGNOSIS — R7303 Prediabetes: Secondary | ICD-10-CM | POA: Diagnosis not present

## 2024-02-08 LAB — POCT GLYCOSYLATED HEMOGLOBIN (HGB A1C)
HbA1c POC (<> result, manual entry): 5.6 % (ref 4.0–5.6)
HbA1c, POC (controlled diabetic range): 5.6 % (ref 0.0–7.0)
HbA1c, POC (prediabetic range): 5.6 % — AB (ref 5.7–6.4)
Hemoglobin A1C: 5.6 % (ref 4.0–5.6)

## 2024-02-08 LAB — LIPID PANEL
Cholesterol: 117 mg/dL (ref 0–200)
HDL: 45.8 mg/dL (ref >39.00)
LDL Cholesterol: 46 mg/dL (ref 0–99)
NonHDL: 71.38
Total CHOL/HDL Ratio: 3
Triglycerides: 127 mg/dL (ref 0.0–149.0)
VLDL: 25.4 mg/dL (ref 0.0–40.0)

## 2024-02-08 MED ORDER — FAMOTIDINE 40 MG PO TABS: 40.0000 mg | ORAL_TABLET | Freq: Every day | ORAL | 3 refills | Status: AC

## 2024-02-08 MED ORDER — ATORVASTATIN CALCIUM 80 MG PO TABS: 80.0000 mg | ORAL_TABLET | Freq: Every day | ORAL | 3 refills | Status: AC

## 2024-02-08 MED ORDER — DIAZEPAM 5 MG PO TABS: ORAL_TABLET | ORAL | 5 refills | Status: DC

## 2024-02-08 MED ORDER — CLOPIDOGREL BISULFATE 75 MG PO TABS: 75.0000 mg | ORAL_TABLET | Freq: Every day | ORAL | 3 refills | Status: AC

## 2024-02-08 MED ORDER — EZETIMIBE 10 MG PO TABS: 10.0000 mg | ORAL_TABLET | Freq: Every day | ORAL | 3 refills | Status: AC

## 2024-02-08 MED ORDER — ALBUTEROL SULFATE HFA 108 (90 BASE) MCG/ACT IN AERS: 2.0000 | INHALATION_SPRAY | Freq: Four times a day (QID) | RESPIRATORY_TRACT | 6 refills | Status: AC | PRN

## 2024-02-08 NOTE — Progress Notes (Signed)
 OFFICE VISIT  02/08/2024  CC:  Chief Complaint  Patient presents with   Medical Management of Chronic Issues    Pt is fasting    Patient is a 73 y.o. female who presents for 6-month follow-up hypercholesterolemia and GAD, A/P as of last visit: "  Hypercholesterolemia, tolerating a atorvastatin  80 mg a day and Zetia  10 mg a day. Goal LDL less than 70. LDL approximately 5 months ago was 38. Lipid panel and hepatic panel today.   #4 generalized anxiety disorder. Doing well long-term on diazepam  5 mg twice a day. Controlled substance contract updated.  Urine drug screen today.   5.  Gastroparesis. GI following. She has a very tough time trying to find anything to eat that she tolerates.  Referred to nutritionist today to see if they can help with this.   6.  Upper extremity tremor, right>> left. She notes this to be pretty recent onset. Possibly related to [employer] . Discussed possible diagnosis of essential tremor but will look out for any worrisome symptoms or progression in the future. Observe for now.   #7 right upper arm pain. Consistent with overuse syndrome. Relative rest, Tylenol  as needed, heat and/or ice as needed."  INTERIM HX: Sylwia is feeling well.    Anxiety well-controlled on diazepam  5 mg twice daily as needed. PMP AWARE reviewed today: most recent rx for diazepam  was filled 01/21/2024, # 60, rx by me.  Most recent gabapentin  prescription filled 12/10/2023, #90, prescription by me. No red flags.  ROS as above, plus--> no fevers, no CP, no SOB, no wheezing, no cough, no dizziness, no HAs, no rashes, no melena/hematochezia.  No polyuria or polydipsia.  No myalgias or arthralgias.  No focal weakness, paresthesias, or tremors.  No acute vision or hearing abnormalities.  No dysuria or unusual/new urinary urgency or frequency.  No recent changes in lower legs. No n/v/d or abd pain.  No palpitations.     Past Medical History:  Diagnosis Date   Abdominal bruit  04/2017   Aortic u/s showed NO ANEURISM.   Anemia    low iron   Anxiety    CAD (coronary artery disease) 07/17/2023   -Cardiac catheterization 02/08/12: no CAD  -Myoview  02/05/2019: EF 56, no ischemia, low risk -CCTA 12/21/20: CAC score 907 (97th percentile); LM 0-24, LAD prox 25-49, RCA 50-69; FFR normal    Carotid artery occlusion    hx L carotid stent   Cataract    COPD (chronic obstructive pulmonary disease) (HCC)    DOE, worse since 11/2018.  Stress test neg 01/2019.  Albut x 1 inh prn helpful some.  Pulm 06/2019, PFTs not diag of COPD, DOE likely from deconditioning.   Coronary artery disease    Elevated transaminase level    Very mild, 2024.  Liver normal on ultrasound.   Essential hypertension, benign 05/22/2014   Gastroparesis 12/2022   delayed emptying on scan->reglan  (Dr. Deloise Ferries)   GERD (gastroesophageal reflux disease)    + hx of esophagitis   [name]    H/O hiatal hernia 02/2019   EGD-->moderate size->surgery recommended but pt declined.   Hematochezia 2020/21   internal hemorrhoids   History of iron deficiency anemia 07/2019   Dr. Karene Oto EGD and colonoscopy, some anorectal muscosal ulceration, o/w just mild chronic gastritis.   Also could be poor absorption due to chronic high dose acid suppression. Hb/iron normalized aft iron infus and ongoing oral iron.   History of kidney stones    one time   ILD (  interstitial lung disease) (HCC)    Detected on lung cancer screening CT 01/2022.  ILD blood panel all normal except weakly + myositis ab's.  Pt to be evaluated by Dr. Bertrum Brodie asymptomatic as of 03/2022.   Mitral valve prolapse    Mixed hyperlipidemia 05/22/2014   OAB (overactive bladder)    detrol LA from urol helpful   Osteopenia 06/06/2016   05/2016 DEXA T-score -1.5.  06/2018 DEXA T score -1.1.  T score -1.2 06/2021   Peripheral vascular disease, unspecified (HCC) 05/22/2014   innominate and carotid.  U/S f/u 12/2016 showed no signif change compared  to 2017--vascular recommended repeat 1 yr.  Doing well as of 08/2018 cardiol f/u-->continued on ASA and Plavix . F/u 08/2019->ASA d/c'd, plavix  continued.   Prediabetes 2018   2018 A1c 6.3%.  05/2018 A1c 6.1%. 07/2019 A1c 6.4%.   Raynaud disease    Seasonal allergies    TIA (transient ischemic attack) 2006    Past Surgical History:  Procedure Laterality Date   ABDOMINAL HYSTERECTOMY  1997   CARDIAC CATHETERIZATION  01/2012   NORMAL   CARDIOVASCULAR STRESS TEST  02/05/2019   NORMAL.  EF normal.   CAROTID ENDARTERECTOMY  08/21/2007   right - Dr. Sabra Cramp   CHOLECYSTECTOMY  2000   COLONOSCOPY  approx 2008; 09/23/19   2008 (Dr. Aldean Amass pt->normal.  08/2019 adenoma, some friable areas of colon + anorectal mucosa inflam w/ ulceration, +diverticulosis. Recall 5 yrs   DEXA  06/06/2016   05/2016 T-score -1.5.  06/2018 T score -1.1.  T score -1.2 06/2021.   ESOPHAGOGASTRODUODENOSCOPY  03/11/2019; 09/23/19   02/2019; 5 cm hiatal hernia; GI suggested surgical repair due to intractable GER.  08/2019 mild chronic gastritis, h pylori neg, multiple benign bx's including duodenum neg for celiac changes.   ESOPHAGOGASTRODUODENOSCOPY N/A 01/18/2021   Procedure: ESOPHAGOGASTRODUODENOSCOPY (EGD);  Surgeon: Hilarie Lovely, MD;  Location: [facility] OR;  Service: Thoracic;  Laterality: N/A;   ESOPHAGOGASTRODUODENOSCOPY N/A 05/12/2022   Procedure: ESOPHAGOGASTRODUODENOSCOPY (EGD);  Surgeon: Hilarie Lovely, MD;  Location: [facility] OR;  Service: Thoracic;  Laterality: N/A;   Incision and drainage of left thenar space abscess  02/23/2015   s/p cat bite (Dr. Aloha Arnold)   innominate stent  2007   12/2014 u/s showed patent stent in innominate and L common carotid.  L prox int carotid 40-59% stenosis   LDCT for lung ca screening  06/2019   NEG->rpt 1 yr   Left common carotid stent  2006 and 2007   "                    "                  "                          "                             "   PEXY N/A 05/12/2022    Procedure: GASTROPEXY;  Surgeon: Hilarie Lovely, MD;  Location: [facility] OR;  Service: Thoracic;  Laterality: N/A;   SINOSCOPY     TRANSTHORACIC ECHOCARDIOGRAM  2007   NORMAL   TUBAL LIGATION     UPPER GI ENDOSCOPY  10/13/2023   with biopsies   US  CAROTID DOPPLER BILATERAL ([facility] HX)  06/2019   left stent w/out stenosis.  R ICA sten 40-50%  obst-stable.  07/20/21 R clear, L 40-59%, normal vert and subclav flow.  07/2022 stable   XI ROBOTIC ASSISTED HIATAL HERNIA REPAIR N/A 05/12/2022   Procedure: XI ROBOTIC ASSISTED HIATAL HERNIA REPAIR;  Surgeon: Hilarie Lovely, MD;  Location: [facility] OR;  Service: Thoracic;  Laterality: N/A;   XI ROBOTIC ASSISTED PARAESOPHAGEAL HERNIA REPAIR N/A 01/18/2021   Procedure: XI ROBOTIC ASSISTED PARAESOPHAGEAL HERNIA REPAIR WITH FUNDOPLICATION WITH MESH;  Surgeon: Hilarie Lovely, MD;  Location: [facility] OR;  Service: Thoracic;  Laterality: N/A;    Outpatient Medications Prior to Visit  Medication Sig Dispense Refill   acetaminophen  (TYLENOL ) 325 MG tablet Take 2 tablets (650 mg total) by mouth every 6 (six) hours as needed for mild pain or moderate pain.     Calcium -Magnesium-Vitamin D (CALCIUM  1200+D3 PO) Take 1 tablet by mouth daily.     dicyclomine (BENTYL) 10 MG capsule Take 10 mg by mouth 4 (four) times daily as needed for spasms.     estradiol (ESTRACE) 0.1 MG/GM vaginal cream Place 1 Applicatorful vaginally 2 (two) times a week.     fexofenadine (ALLEGRA) 180 MG tablet Take 180 mg by mouth daily as needed for allergies.     fluticasone  (FLONASE ) 50 MCG/ACT nasal spray SPRAY 2 SPRAYS INTO EACH NOSTRIL EVERY DAY 48 mL 1   gabapentin  (NEURONTIN ) 300 MG capsule Take 1 capsule (300 mg total) by mouth at bedtime. 90 capsule 3   Multiple Vitamins-Minerals (CENTRUM SILVER 50+WOMEN) TABS Take 1 tablet by mouth daily.     pantoprazole  (PROTONIX ) 40 MG tablet Take 1 tablet (40 mg total) by mouth daily. Patient needs follow up appointment for future refills. Please  call [phone] to schedule an appointment. 90 tablet 0   Probiotic Product (PROBIOTIC DAILY PO) Take 1 capsule by mouth daily.     prochlorperazine  (COMPAZINE ) 10 MG tablet Take 10 mg by mouth 2 (two) times daily as needed for nausea or vomiting.     Prucalopride Succinate  (MOTEGRITY ) 2 MG TABS Take 1 tablet (2 mg total) by mouth daily. 90 tablet 2   tolterodine (DETROL LA) 4 MG 24 hr capsule Take 4 mg by mouth daily.     albuterol  (VENTOLIN  HFA) 108 (90 Base) MCG/ACT inhaler Inhale 2 puffs into the lungs every 6 (six) hours as needed for wheezing or shortness of breath. 8 g 6   nitroGLYCERIN  (NITROSTAT ) 0.4 MG SL tablet Place 1 tablet (0.4 mg total) under the tongue every 5 (five) minutes as needed for chest pain. (Patient not taking: Reported on 02/08/2024) 25 tablet 6   atorvastatin  (LIPITOR) 80 MG tablet Take 1 tablet (80 mg total) by mouth daily. DUE FOR APPT MAY 90 tablet 0   clopidogrel  (PLAVIX ) 75 MG tablet Take 1 tablet (75 mg total) by mouth daily. 90 tablet 1   diazepam  (VALIUM ) 5 MG tablet 1 tab po bid as needed for anxiety (Patient taking differently: Take 5 mg by mouth at bedtime. 1 tab po bid as needed for anxiety) 60 tablet 5   ezetimibe  (ZETIA ) 10 MG tablet Take 1 tablet (10 mg total) by mouth daily. DUE FOR APPT MAY 90 tablet 0   famotidine  (PEPCID ) 40 MG tablet Take 1 tablet (40 mg total) by mouth daily. 90 tablet 1   No facility-administered medications prior to visit.    Allergies  Allergen Reactions   Doxycycline  Shortness Of Breath and Swelling    Lips swelling and short of breath   Breo Ellipta  [Fluticasone  Furoate-Vilanterol] Other (See Comments)  Lower extremity weakness, lightheadedness    Demerol Hcl [Meperidine] Nausea And Vomiting   Fentanyl  Nausea And Vomiting    Can tolerate with nausea meds  Pt states all pain medications cause N/V for her    Fluocinolone Nausea And Vomiting   Imdur  [Isosorbide  Nitrate] Nausea And Vomiting   Penicillins Hives     Review of Systems As per HPI  PE:    02/08/2024    9:56 AM 01/28/2024    5:45 PM 01/28/2024    5:30 PM  Vitals with BMI  Height 5' 4.5"    Weight 133 lbs 13 oz    BMI 22.62    Systolic 118 113 409  Diastolic 80 75 71  Pulse 73 87 91     Physical Exam  Gen: Alert, well appearing.  Patient is oriented to person, place, time, and situation. AFFECT: pleasant, lucid thought and speech. Neck: no carotid bruits CV: RRR, no m/r/g.   LUNGS: CTA bilat, nonlabored resps, good aeration in all lung fields.   LABS:  Last CBC Lab Results  Component Value Date   WBC 8.5 01/28/2024   HGB 15.0 01/28/2024   HCT 44.0 01/28/2024   MCV 92.7 01/28/2024   MCH 31.4 01/28/2024   RDW 13.2 01/28/2024   PLT 213 01/28/2024   Last metabolic panel Lab Results  Component Value Date   GLUCOSE 121 (H) 01/28/2024   NA 142 01/28/2024   K 3.8 01/28/2024   CL 107 01/28/2024   CO2 23 01/28/2024   BUN 18 01/28/2024   CREATININE 0.70 01/28/2024   GFRNONAA >60 01/28/2024   CALCIUM  9.3 01/28/2024   PROT 6.8 01/28/2024   ALBUMIN  3.9 01/28/2024   BILITOT 1.7 (H) 01/28/2024   ALKPHOS 124 01/28/2024   AST 61 (H) 01/28/2024   ALT 78 (H) 01/28/2024   ANIONGAP 8 01/28/2024   Last lipids Lab Results  Component Value Date   CHOL 114 08/09/2023   HDL 45.60 08/09/2023   LDLCALC 51 08/09/2023   LDLDIRECT 134.0 01/28/2021   TRIG 87.0 08/09/2023   CHOLHDL 3 08/09/2023   Last hemoglobin A1c Lab Results  Component Value Date   HGBA1C 5.6 02/08/2024   HGBA1C 5.6 02/08/2024   HGBA1C 5.6 (A) 02/08/2024   HGBA1C 5.6 02/08/2024   Last thyroid  functions Lab Results  Component Value Date   TSH 0.89 08/03/2021   Last vitamin D Lab Results  Component Value Date   VD25OH 37.87 08/13/2019   Last vitamin B12 and Folate Lab Results  Component Value Date   VITAMINB12 727 04/26/2022   FOLATE 10.4 04/26/2022   Lab Results  Component Value Date   IRON 83 03/20/2023   TIBC 418.6 03/20/2023    FERRITIN 47.7 03/20/2023   IMPRESSION AND PLAN:  1) Hypercholesterolemia, tolerating a atorvastatin  80 mg a day and Zetia  10 mg a day. Goal LDL less than 70. LDL 6 months ago was 51. Lipid panel today.   2) generalized anxiety disorder. Doing well long-term on diazepam  5 mg twice a day. Controlled substance contract and urine drug screen are up-to-date. Diazepam  refilled today, #60, refill x 5.   #3 gastroparesis. GI following.  Was [place], now [facility]. She is doing very well to try to increase protein but minimize carbohydrates.  She has gained 6 pounds in the last 6 months.  #4 prediabetes, doing great-->POC Hba1c today is 5.6%!   An After Visit Summary was printed and given to the patient.  FOLLOW UP: Return in about 6  months (around 08/10/2024) for annual CPE (fasting). Next CPE 07/2024 Signed:  Arletha Lady, MD           02/08/2024

## 2024-02-08 NOTE — Patient Instructions (Signed)
   Your A1c was 5.6  It was very nice to see you today!   PLEASE NOTE:  If labs were collected or images ordered, we will inform you of  results once we have received them and reviewed. We will contact you either by echart message, or telephone call.     If we ordered any referrals today, please let us  know if you have not heard from their office within the next 2 weeks. You should receive a letter via MyChart confirming if the referral was approved and their office contact information to schedule.

## 2024-02-13 DIAGNOSIS — N3941 Urge incontinence: Secondary | ICD-10-CM | POA: Diagnosis not present

## 2024-02-16 ENCOUNTER — Other Ambulatory Visit: Payer: Self-pay | Admitting: Surgery

## 2024-02-16 DIAGNOSIS — I779 Disorder of arteries and arterioles, unspecified: Secondary | ICD-10-CM

## 2024-02-24 ENCOUNTER — Other Ambulatory Visit: Payer: Self-pay | Admitting: Family Medicine

## 2024-02-25 ENCOUNTER — Encounter: Payer: Self-pay | Admitting: Family Medicine

## 2024-02-26 ENCOUNTER — Encounter: Payer: Self-pay | Admitting: Family Medicine

## 2024-02-26 MED ORDER — DIAZEPAM 5 MG PO TABS: ORAL_TABLET | ORAL | 5 refills | Status: DC

## 2024-02-26 NOTE — Telephone Encounter (Signed)
 Hi [name], a specific iron level was not checked but your hemoglobin (which is an indirect measurement of iron level) was 15, which is at the upper limit of normal for females.  Therefore, I do not recommend you start iron.  However, do you take vitamin B12 supplement? If not, start over-the-counter vitamin B12 1000 microgram SUBLINGUAL tab daily. -PM

## 2024-02-26 NOTE — Telephone Encounter (Signed)
 Okay, prescription sent

## 2024-03-01 DIAGNOSIS — R351 Nocturia: Secondary | ICD-10-CM | POA: Diagnosis not present

## 2024-03-01 DIAGNOSIS — R3914 Feeling of incomplete bladder emptying: Secondary | ICD-10-CM | POA: Diagnosis not present

## 2024-03-01 DIAGNOSIS — R3915 Urgency of urination: Secondary | ICD-10-CM | POA: Diagnosis not present

## 2024-03-01 DIAGNOSIS — N3281 Overactive bladder: Secondary | ICD-10-CM | POA: Diagnosis not present

## 2024-03-04 ENCOUNTER — Ambulatory Visit: Attending: Surgery | Admitting: Surgery

## 2024-03-04 ENCOUNTER — Ambulatory Visit (HOSPITAL_COMMUNITY)
Admission: RE | Admit: 2024-03-04 | Discharge: 2024-03-04 | Disposition: A | Discharge status: Home / Self Care | Source: Ambulatory Visit | Attending: Surgery | Admitting: Surgery

## 2024-03-04 ENCOUNTER — Encounter: Payer: Self-pay | Admitting: Surgery

## 2024-03-04 VITALS — BP 159/75 | HR 76 | Temp 97.8°F | Ht 64.5 in | Wt 136.0 lb

## 2024-03-04 DIAGNOSIS — I6523 Occlusion and stenosis of bilateral carotid arteries: Secondary | ICD-10-CM

## 2024-03-04 NOTE — Progress Notes (Signed)
 Vascular and Vein Specialist of [facility]  Patient name: [name] MRN: [id number] DOB: 1951-04-04 Sex: female   REASON FOR VISIT:    Follow-up  HISOTRY OF PRESENT ILLNESS:    [name] is a 74 y.o. female who has undergone the following procedures: 04/01/2005: Left common carotid stenting (6 x 18, Dr. Sabra Cramp) 06/23/2006: Innominate artery stent (Herculink 7 x 18, Dr. Sabra Cramp 11/16/2006: Innominate artery stent, Dr. Sabra Cramp 08/21/2027 right carotid endarterectomy, Dr. Sabra Cramp  She was in the emergency department in May 2025 with headaches nausea and vomiting.  She was complaining of pain behind her right eye.  She did not have any episodes of amaurosis or weakness in either extremity or dysphagia.  The symptoms have resolved.  The patient is on high-dose statin therapy for hypercholesterolemia as well as Zetia .  She is having issues with gastroparesis.  She has a history of coronary artery disease with elevated calcium  score she suffers from COPD from former tobacco abuse.  She is medically managed for hypertension  PAST MEDICAL HISTORY:   Past Medical History:  Diagnosis Date   Abdominal bruit 04/2017   Aortic u/s showed NO ANEURISM.   Anemia    low iron   Anxiety    CAD (coronary artery disease) 07/17/2023   -Cardiac catheterization 02/08/12: no CAD  -Myoview  02/05/2019: EF 56, no ischemia, low risk -CCTA 12/21/20: CAC score 907 (97th percentile); LM 0-24, LAD prox 25-49, RCA 50-69; FFR normal    Carotid artery occlusion    hx L carotid stent   Cataract    COPD (chronic obstructive pulmonary disease) (HCC)    DOE, worse since 11/2018.  Stress test neg 01/2019.  Albut x 1 inh prn helpful some.  Pulm 06/2019, PFTs not diag of COPD, DOE likely from deconditioning.   Coronary artery disease    Elevated transaminase level    Very mild, 2024.  Liver normal on ultrasound.   Essential hypertension, benign 05/22/2014   Gastroparesis 12/2022    delayed emptying on scan->reglan  (Dr. Deloise Ferries)   GERD (gastroesophageal reflux disease)    + hx of esophagitis   [name] syndrome    H/O hiatal hernia 02/2019   EGD-->moderate size->surgery recommended but pt declined.   Hematochezia 2020/21   internal hemorrhoids   History of iron deficiency anemia 07/2019   Dr. Karene Oto EGD and colonoscopy, some anorectal muscosal ulceration, o/w just mild chronic gastritis.   Also could be poor absorption due to chronic high dose acid suppression. Hb/iron normalized aft iron infus and ongoing oral iron.   History of kidney stones    one time   ILD (interstitial lung disease) (HCC)    Detected on lung cancer screening CT 01/2022.  ILD blood panel all normal except weakly + myositis ab's.  Pt to be evaluated by Dr. Bertrum Brodie asymptomatic as of 03/2022.   Mitral valve prolapse    Mixed hyperlipidemia 05/22/2014   OAB (overactive bladder)    detrol LA from urol helpful   Osteopenia 06/06/2016   05/2016 DEXA T-score -1.5.  06/2018 DEXA T score -1.1.  T score -1.2 06/2021   Peripheral vascular disease, unspecified (HCC) 05/22/2014   innominate and carotid.  U/S f/u 12/2016 showed no signif change compared to 2017--vascular recommended repeat 1 yr.  Doing well as of 08/2018 cardiol f/u-->continued on ASA and Plavix . F/u 08/2019->ASA d/c'd, plavix  continued.   Prediabetes 2018   2018 A1c 6.3%.  05/2018 A1c 6.1%. 07/2019 A1c 6.4%.   Raynaud disease  Seasonal allergies    TIA (transient ischemic attack) 2006     FAMILY HISTORY:   Family History  Problem Relation Age of Onset   Heart attack Mother    CAD Mother    Heart disease Mother        Before age 61   Hypertension Mother    Hyperlipidemia Mother    Hypertension Father    Pneumonia Father    Hyperlipidemia Father    Heart murmur Sister    Osteoporosis Paternal Grandmother    CAD Paternal Grandfather    Breast cancer Neg Hx    Colon cancer Neg Hx    Esophageal cancer Neg Hx    Rectal  cancer Neg Hx    Stomach cancer Neg Hx    Colon polyps Neg Hx    Pancreatic cancer Neg Hx     SOCIAL HISTORY:   Social History   Tobacco Use   Smoking status: Former    Current packs/day: 0.00    Average packs/day: 1.3 packs/day for 38.0 years (47.5 ttl pk-yrs)    Types: Cigarettes    Start date: 09/26/1969    Quit date: 09/27/2007    Years since quitting: 16.4    Passive exposure: Past   Smokeless tobacco: Never  Substance Use Topics   Alcohol use: No     ALLERGIES:   Allergies  Allergen Reactions   Doxycycline  Shortness Of Breath and Swelling    Lips swelling and short of breath   Breo Ellipta  [Fluticasone  Furoate-Vilanterol] Other (See Comments)    Lower extremity weakness, lightheadedness    Demerol Hcl [Meperidine] Nausea And Vomiting   Fentanyl  Nausea And Vomiting    Can tolerate with nausea meds  Pt states all pain medications cause N/V for her    Fluocinolone Nausea And Vomiting   Imdur  [Isosorbide  Nitrate] Nausea And Vomiting   Penicillins Hives     CURRENT MEDICATIONS:   Current Outpatient Medications  Medication Sig Dispense Refill   acetaminophen  (TYLENOL ) 325 MG tablet Take 2 tablets (650 mg total) by mouth every 6 (six) hours as needed for mild pain or moderate pain.     albuterol  (VENTOLIN  HFA) 108 (90 Base) MCG/ACT inhaler Inhale 2 puffs into the lungs every 6 (six) hours as needed for wheezing or shortness of breath. 8 g 6   atorvastatin  (LIPITOR) 80 MG tablet Take 1 tablet (80 mg total) by mouth daily. 90 tablet 3   Calcium -Magnesium-Vitamin D (CALCIUM  1200+D3 PO) Take 1 tablet by mouth daily.     clopidogrel  (PLAVIX ) 75 MG tablet Take 1 tablet (75 mg total) by mouth daily. 90 tablet 3   diazepam  (VALIUM ) 5 MG tablet 1 tab po bid as needed for anxiety 60 tablet 5   dicyclomine (BENTYL) 10 MG capsule Take 10 mg by mouth 4 (four) times daily as needed for spasms.     estradiol (ESTRACE) 0.1 MG/GM vaginal cream Place 1 Applicatorful vaginally 2 (two)  times a week.     ezetimibe  (ZETIA ) 10 MG tablet Take 1 tablet (10 mg total) by mouth daily. 90 tablet 3   famotidine  (PEPCID ) 40 MG tablet Take 1 tablet (40 mg total) by mouth daily. 90 tablet 3   fexofenadine (ALLEGRA) 180 MG tablet Take 180 mg by mouth daily as needed for allergies.     fluticasone  (FLONASE ) 50 MCG/ACT nasal spray SPRAY 2 SPRAYS INTO EACH NOSTRIL EVERY DAY 48 mL 1   gabapentin  (NEURONTIN ) 300 MG capsule Take 1 capsule (300 mg total)  by mouth at bedtime. 90 capsule 3   Multiple Vitamins-Minerals (CENTRUM SILVER 50+WOMEN) TABS Take 1 tablet by mouth daily.     nitroGLYCERIN  (NITROSTAT ) 0.4 MG SL tablet Place 1 tablet (0.4 mg total) under the tongue every 5 (five) minutes as needed for chest pain. 25 tablet 6   pantoprazole  (PROTONIX ) 40 MG tablet Take 1 tablet (40 mg total) by mouth daily. Patient needs follow up appointment for future refills. Please call [phone] to schedule an appointment. 90 tablet 0   Probiotic Product (PROBIOTIC DAILY PO) Take 1 capsule by mouth daily.     prochlorperazine  (COMPAZINE ) 10 MG tablet Take 10 mg by mouth 2 (two) times daily as needed for nausea or vomiting.     Prucalopride Succinate  (MOTEGRITY ) 2 MG TABS Take 1 tablet (2 mg total) by mouth daily. 90 tablet 2   tolterodine (DETROL LA) 4 MG 24 hr capsule Take 4 mg by mouth daily.     No current facility-administered medications for this visit.    REVIEW OF SYSTEMS:   [X]  denotes positive finding, [ ]  denotes negative finding Cardiac  Comments:  Chest pain or chest pressure:    Shortness of breath upon exertion:    Short of breath when lying flat:    Irregular heart rhythm:        Vascular    Pain in calf, thigh, or hip brought on by ambulation:    Pain in feet at night that wakes you up from your sleep:     Blood clot in your veins:    Leg swelling:         Pulmonary    Oxygen  at home:    Productive cough:     Wheezing:         Neurologic    Sudden weakness in arms or  legs:     Sudden numbness in arms or legs:     Sudden onset of difficulty speaking or slurred speech:    Temporary loss of vision in one eye:     Problems with dizziness:         Gastrointestinal    Blood in stool:     Vomited blood:         Genitourinary    Burning when urinating:     Blood in urine:        Psychiatric    Major depression:         Hematologic    Bleeding problems:    Problems with blood clotting too easily:        Skin    Rashes or ulcers:        Constitutional    Fever or chills:      PHYSICAL EXAM:   Vitals:   03/04/24 1507 03/04/24 1508  BP: 137/82 (!) 159/75  Pulse: 76   Temp: 97.8 F (36.6 C)   SpO2: 94%   Weight: 136 lb (61.7 kg)   Height: 5' 4.5" (1.638 m)     GENERAL: The patient is a well-nourished female, in no acute distress. The vital signs are documented above. CARDIAC: There is a regular rate and rhythm.  PULMONARY: Non-labored respirations MUSCULOSKELETAL: There are no major deformities or cyanosis. NEUROLOGIC: No focal weakness or paresthesias are detected. SKIN: There are no ulcers or rashes noted. PSYCHIATRIC: The patient has a normal affect.  STUDIES:   I have reviewed the following CT scan: 1. Negative for large vessel occlusion. Patent chronically stented origins of the brachiocephalic and left  CCA with no adverse features. Chronic right carotid endarterectomy with no adverse features.   2. Positive for intracranial and extracranial atherosclerosis: - bulky calcified plaque at the Left ICA origin with estimated 72% stenosis, progressed since 2013. - bilateral ICA siphon plaque without significant stenosis. - Bilateral proximal Subclavian artery plaque, 60% stenosis estimated on the Left, 50% on the Right.   3. No acute intracranial abnormality by CT. Stable and normal for age CT appearance of the brain.   4. Aortic Atherosclerosis (ICD10-I70.0) and Emphysema (ICD10-J43.9  Brain MRI 1. No acute intracranial  abnormality or significant interval change. 2. 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 3. Otherwise normal MRI appearance of the brain for age.   Carotid duplex (03/04/2024): Right Carotid: Velocities in the right ICA are consistent with a 1-39%  stenosis.   Left Carotid: Velocities in the left ICA are consistent with a 1-39%  stenosis.               Non-hemodynamically significant plaque <50% noted in the  CCA.   Vertebrals: Bilateral vertebral arteries demonstrate antegrade flow.  Subclavians: Normal flow hemodynamics were seen in bilateral subclavian               arteries.  MEDICAL ISSUES:   Carotid: The symptoms which brought the patient to the ER and led to her imaging studies are not consistent with a TIA or stroke.  The CT scan showed that her innominate and left carotid stent are without significant stenosis as is her right carotid endarterectomy.  The CT scan suggested a 72% left carotid stenosis which I feel is overestimated when I looked at the image.  In addition, her duplex today shows less than 40% bilateral stenosis.  Therefore I would not recommend any surgical intervention at this time.  I think she can continue with routine surveillance with repeat carotid ultrasound in 1 year   Gareld June, IV, MD, FACS [facility] [phone] Pager [phone]

## 2024-03-09 ENCOUNTER — Other Ambulatory Visit: Payer: Self-pay | Admitting: Gastroenterology

## 2024-03-27 ENCOUNTER — Other Ambulatory Visit: Payer: Self-pay

## 2024-03-27 ENCOUNTER — Encounter: Payer: Self-pay | Admitting: Acute Care

## 2024-03-27 ENCOUNTER — Ambulatory Visit: Admitting: Acute Care

## 2024-03-27 ENCOUNTER — Telehealth: Payer: Self-pay | Admitting: Acute Care

## 2024-03-27 VITALS — BP 123/75 | HR 87 | Ht 64.0 in | Wt 137.4 lb

## 2024-03-27 DIAGNOSIS — R918 Other nonspecific abnormal finding of lung field: Secondary | ICD-10-CM

## 2024-03-27 DIAGNOSIS — R0609 Other forms of dyspnea: Secondary | ICD-10-CM

## 2024-03-27 DIAGNOSIS — J449 Chronic obstructive pulmonary disease, unspecified: Secondary | ICD-10-CM | POA: Diagnosis not present

## 2024-03-27 DIAGNOSIS — Z87891 Personal history of nicotine dependence: Secondary | ICD-10-CM

## 2024-03-27 MED ORDER — STIOLTO RESPIMAT 2.5-2.5 MCG/ACT IN AERS: 2.0000 | INHALATION_SPRAY | Freq: Every day | RESPIRATORY_TRACT | 12 refills | Status: DC

## 2024-03-27 NOTE — Patient Instructions (Signed)
 It is good to see you today. I am glad you are doing so well from a respiratory standpoint. Start using your Stiolto 2 puffs in the morning every day without fail. This is a maintenance inhaler and will help you every day with your breathing. Use your albuterol  as needed for shortness of breath or wheezing This is your rescue inhaler The lung cancer screening team will get in touch with you regarding your next lung cancer screening scan. Follow-up in 1 year, July 2026 with Dr. Kara to establish as a new patient as Dr. Brenna has left the practice Please call if you need us  sooner Continue walking on the treadmill as you have been doing Have a great rest of your year Please contact office for sooner follow up if symptoms do not improve or worsen or seek emergency care

## 2024-03-27 NOTE — Telephone Encounter (Signed)
 Patient inquired about annual LDCT. Smoking history shows over 15 years as nonsmoker. Patient confirmed.  She would be interested in CT chest wo, if recommended by provider.  Message to Lauraine Lites, NP in Epic secure chat of patient request.

## 2024-03-27 NOTE — Progress Notes (Signed)
 History of Present Illness [name] is a 73 y.o. female former smoker, ( 47.5 pack year smoking history, quit 2009) history of gastroesophageal reflux, moderate hiatal hernia, cough, stroke, emphysema on lung cancer screening CT however no obstruction on PFTs. She was followed by Dr. Brenna for dyspnea.   Synopsis Seen by Dr. Geronimo in clinic for evaluation of some abnormality that was seen on lung cancer screening CT. She did have a rheumatologic work-up that showed a positive anti-SS-A antibody. She was referred to rheumatology. She also was seen by Dr. Shyrl for consideration of repeat surgery for her hiatal hernia because the first 1 failed.She is actually taken on 05/12/2022 to the operating room for repeat hiatal hernia repair. Last office visit with Dr. Shyrl on 05/20/2022, office note reviewed. She has a follow-up with them in 1 month.Patient had pulmonary function test completed in July 2023 which showed a normal ratio normal FEV1 and FVC. Normal total lung capacity normal DLCO. She was on Spiriva  in the past for management of her COPD she does have emphysema on her CT scan but no evidence obstruction on PFTs. She still has some tightness around her epigastrium that she thinks related to having surgery on the diaphragm. She feels like its getting a little bit better slowly.   From a respiratory standpoint using her Spiriva  daily. She had repeat surgery on her belly for the hiatal hernia. Now been struggling with some gastroparesis symptoms. She is being managed by gastroenterology. Waiting for an appointment at [facility] for consideration of a potential research medicine. They have also talked about gastric pacemaker at some point. However from a respiratory standpoint she is doing well.    03/27/2024 Pt. Presents for follow up. She states she has been doing well. From a respiratory standpoint she is doing well. She does have some dyspnea with exertion. She uses her Stialto as  needed, not on a maintenance basis. We discussed using Stialto every day. She is going to try this. I have renewed her prescription. 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 She walks on a treadmill every day 2 miles. It usually takes her 40 minutes.   She no longer qualifies for lung cancer screening as she has quit for > 15 years.   Test Results: LDCT 02/10/2023 Mediastinum/Nodes: No significant thyroid  nodules. Unremarkable esophagus. No pathologically enlarged axillary, mediastinal or hilar lymph nodes, noting limited sensitivity for the detection of hilar adenopathy on this noncontrast study.   Lungs/Pleura: No pneumothorax. No pleural effusion. Moderate centrilobular emphysema with diffuse bronchial wall thickening. No acute consolidative airspace disease or lung masses. Solid subpleural 5 mm posterior right lower lobe pulmonary nodule (series 6/image 88) is stable. No new significant pulmonary nodules.  IMPRESSION: 1. Lung-RADS 2, benign appearance or behavior. Continue annual screening with low-dose chest CT without contrast in 12 months. 2. Two-vessel coronary atherosclerosis. 3. Aortic Atherosclerosis (ICD10-I70.0) and Emphysema (ICD10-J43.9).   Pulmonary Functions Testing Results:     Latest Ref Rng & Units 03/30/2022   10:24 AM 07/05/2019    2:41 PM  PFT Results  FVC-Pre L 2.35  2.43  P  FVC-Predicted Pre % 80  80  P  FVC-Post L 2.47  2.53  P  FVC-Predicted Post % 84  84  P  Pre FEV1/FVC % % 75  74  P  Post FEV1/FCV % % 78  76  P  FEV1-Pre L 1.76  1.80  P  FEV1-Predicted Pre %  79  78  P  FEV1-Post L 1.93  1.91  P  DLCO uncorrected ml/min/mmHg 17.47  15.39  P  DLCO UNC% % 91  79  P  DLCO corrected ml/min/mmHg 17.47     DLCO COR %Predicted % 91     DLVA Predicted % 99  84  P  TLC L 5.04  4.91  P  TLC % Predicted % 101  98  P  RV % Predicted % 124  115  P    P Preliminary result         Latest Ref Rng & Units 01/28/2024   12:13 PM 01/28/2024    12:02 PM 08/09/2023    8:29 AM  CBC  WBC 4.0 - 10.5 K/uL  8.5  5.9   Hemoglobin 12.0 - 15.0 g/dL 84.9  84.5  85.5   Hematocrit 36.0 - 46.0 % 44.0  45.4  43.8   Platelets 150 - 400 K/uL  213  256.0        Latest Ref Rng & Units 01/28/2024   12:13 PM 01/28/2024   12:02 PM 08/28/2023    8:27 AM  BMP  Glucose 70 - 99 mg/dL 878  877  94   BUN 8 - 23 mg/dL 18  18  9    Creatinine 0.44 - 1.00 mg/dL 9.29  9.32  9.25   Sodium 135 - 145 mmol/L 142  138  143   Potassium 3.5 - 5.1 mmol/L 3.8  3.8  4.3   Chloride 98 - 111 mmol/L 107  107  106   CO2 22 - 32 mmol/L  23  29   Calcium  8.9 - 10.3 mg/dL  9.3  89.1     BNP No results found for: BNP  ProBNP No results found for: PROBNP  PFT    Component Value Date/Time   FEV1PRE 1.76 03/30/2022 1024   FEV1POST 1.93 03/30/2022 1024   FVCPRE 2.35 03/30/2022 1024   FVCPOST 2.47 03/30/2022 1024   TLC 5.04 03/30/2022 1024   DLCOUNC 17.47 03/30/2022 1024   PREFEV1FVCRT 75 03/30/2022 1024   PSTFEV1FVCRT 78 03/30/2022 1024    VAS US  CAROTID Result Date: 03/04/2024 Carotid Arterial Duplex Study Patient Name:  [name]  Date of Exam:   03/04/2024 Medical Rec #: [id number]        Accession #:    [id number] Date of Birth: 1951-04-18        Patient Gender: F Patient Age:   73 years Exam Location:  [address] Procedure:      VAS US  CAROTID Referring Phys: GAILE NEW --------------------------------------------------------------------------------  Indications:       Carotid artery disease. Risk Factors:      Hypertension, hyperlipidemia, past history of smoking,                    coronary artery disease. Comparison Study:  08/14/23 Performing Technologist: Garnette Rockers  Examination Guidelines: A complete evaluation includes B-mode imaging, spectral Doppler, color Doppler, and power Doppler as needed of all accessible portions of each vessel. Bilateral testing is considered an integral part of a complete examination. Limited examinations for  reoccurring indications may be performed as noted.  Right Carotid Findings: +----------+--------+--------+--------+------------------+------------------+           PSV cm/sEDV cm/sStenosisPlaque DescriptionComments           +----------+--------+--------+--------+------------------+------------------+ CCA Prox  96      18                                                   +----------+--------+--------+--------+------------------+------------------+  CCA Distal108     24                                intimal thickening +----------+--------+--------+--------+------------------+------------------+ ICA Prox  36      10      1-39%   smooth                               +----------+--------+--------+--------+------------------+------------------+ ICA Distal91      26                                                   +----------+--------+--------+--------+------------------+------------------+ ECA       108     11                                                   +----------+--------+--------+--------+------------------+------------------+ +----------+--------+-------+--------+-------------------+           PSV cm/sEDV cmsDescribeArm Pressure (mmHG) +----------+--------+-------+--------+-------------------+ [id number]                                        +----------+--------+-------+--------+-------------------+ +---------+--------+--+--------+--+ VertebralPSV cm/s51EDV cm/s14 +---------+--------+--+--------+--+  Left Carotid Findings: +---------+--------+-------+--------+------------------------+-----------------+          PSV cm/sEDV    StenosisPlaque Description      Comments                           cm/s                                                     +---------+--------+-------+--------+------------------------+-----------------+ CCA Prox 127     14                                     intimal                                                                    thickening        +---------+--------+-------+--------+------------------------+-----------------+ CCA Mid  102     25     <50%    heterogenous and                                                          irregular                                 +---------+--------+-------+--------+------------------------+-----------------+  CCA      88      19                                     intimal           Distal                                                  thickening        +---------+--------+-------+--------+------------------------+-----------------+ ICA Prox 118     26     1-39%   heterogenous and                                                          irregular                                 +---------+--------+-------+--------+------------------------+-----------------+ ICA      70      20                                                       Distal                                                                    +---------+--------+-------+--------+------------------------+-----------------+ ECA      102     10                                                       +---------+--------+-------+--------+------------------------+-----------------+ +----------+--------+--------+--------+-------------------+           PSV cm/sEDV cm/sDescribeArm Pressure (mmHG) +----------+--------+--------+--------+-------------------+ [id number]                                         +----------+--------+--------+--------+-------------------+ +---------+--------+--+--------+--+ VertebralPSV cm/s44EDV cm/s11 +---------+--------+--+--------+--+   Summary: Right Carotid: Velocities in the right ICA are consistent with a 1-39% stenosis. Left Carotid: Velocities in the left ICA are consistent with a 1-39% stenosis.               Non-hemodynamically significant plaque <50% noted in the CCA. Vertebrals:  Bilateral  vertebral arteries demonstrate antegrade flow. Subclavians: Normal flow hemodynamics were seen in bilateral subclavian              arteries. *See table(s) above for measurements and observations.  Electronically signed by Gaile New MD on 03/04/2024 at 2:52:35 PM.    Final  Past medical hx Past Medical History:  Diagnosis Date   Abdominal bruit 04/2017   Aortic u/s showed NO ANEURISM.   Anemia    low iron   Anxiety    CAD (coronary artery disease) 07/17/2023   -Cardiac catheterization 02/08/12: no CAD  -Myoview  02/05/2019: EF 56, no ischemia, low risk -CCTA 12/21/20: CAC score 907 (97th percentile); LM 0-24, LAD prox 25-49, RCA 50-69; FFR normal    Carotid artery occlusion    hx L carotid stent   Cataract    COPD (chronic obstructive pulmonary disease) (HCC)    DOE, worse since 11/2018.  Stress test neg 01/2019.  Albut x 1 inh prn helpful some.  Pulm 06/2019, PFTs not diag of COPD, DOE likely from deconditioning.   Coronary artery disease    Elevated transaminase level    Very mild, 2024.  Liver normal on ultrasound.   Essential hypertension, benign 05/22/2014   Gastroparesis 12/2022   delayed emptying on scan->reglan  (Dr. Shyrl)   GERD (gastroesophageal reflux disease)    + hx of esophagitis   Bertrum syndrome    H/O hiatal hernia 02/2019   EGD-->moderate size->surgery recommended but pt declined.   Hematochezia 2020/21   internal hemorrhoids   History of iron deficiency anemia 07/2019   Dr. Terrill EGD and colonoscopy, some anorectal muscosal ulceration, o/w just mild chronic gastritis.   Also could be poor absorption due to chronic high dose acid suppression. Hb/iron normalized aft iron infus and ongoing oral iron.   History of kidney stones    one time   ILD (interstitial lung disease) (HCC)    Detected on lung cancer screening CT 01/2022.  ILD blood panel all normal except weakly + myositis ab's.  Pt to be evaluated by Dr. Geronimo asymptomatic as of 03/2022.    Mitral valve prolapse    Mixed hyperlipidemia 05/22/2014   OAB (overactive bladder)    detrol LA from urol helpful   Osteopenia 06/06/2016   05/2016 DEXA T-score -1.5.  06/2018 DEXA T score -1.1.  T score -1.2 06/2021   Peripheral vascular disease, unspecified (HCC) 05/22/2014   innominate and carotid.  U/S f/u 12/2016 showed no signif change compared to 2017--vascular recommended repeat 1 yr.  Doing well as of 08/2018 cardiol f/u-->continued on ASA and Plavix . F/u 08/2019->ASA d/c'd, plavix  continued.   Prediabetes 2018   2018 A1c 6.3%.  05/2018 A1c 6.1%. 07/2019 A1c 6.4%.   Raynaud disease    Seasonal allergies    TIA (transient ischemic attack) 2006     Social History   Tobacco Use   Smoking status: Former    Current packs/day: 0.00    Average packs/day: 1.3 packs/day for 38.0 years (47.5 ttl pk-yrs)    Types: Cigarettes    Start date: 09/26/1969    Quit date: 09/27/2007    Years since quitting: 16.5    Passive exposure: Past   Smokeless tobacco: Never  Vaping Use   Vaping status: Never Used  Substance Use Topics   Alcohol use: No   Drug use: No    Ms.Tool reports that she quit smoking about 16 years ago. Her smoking use included cigarettes. She started smoking about 54 years ago. She has a 47.5 pack-year smoking history. She has been exposed to tobacco smoke. She has never used smokeless tobacco. She reports that she does not drink alcohol and does not use drugs.  Tobacco Cessation: Former smoker with a 47.5 pack year smoking history. Quit 2009   Past surgical hx,  Family hx, Social hx all reviewed.  Current Outpatient Medications on File Prior to Visit  Medication Sig   acetaminophen  (TYLENOL ) 325 MG tablet Take 2 tablets (650 mg total) by mouth every 6 (six) hours as needed for mild pain or moderate pain.   albuterol  (VENTOLIN  HFA) 108 (90 Base) MCG/ACT inhaler Inhale 2 puffs into the lungs every 6 (six) hours as needed for wheezing or shortness of breath.    atorvastatin  (LIPITOR) 80 MG tablet Take 1 tablet (80 mg total) by mouth daily.   Calcium -Magnesium-Vitamin D (CALCIUM  1200+D3 PO) Take 1 tablet by mouth daily.   clopidogrel  (PLAVIX ) 75 MG tablet Take 1 tablet (75 mg total) by mouth daily.   diazepam  (VALIUM ) 5 MG tablet 1 tab po bid as needed for anxiety   dicyclomine (BENTYL) 10 MG capsule Take 10 mg by mouth 4 (four) times daily as needed for spasms.   estradiol (ESTRACE) 0.1 MG/GM vaginal cream Place 1 Applicatorful vaginally 2 (two) times a week.   ezetimibe  (ZETIA ) 10 MG tablet Take 1 tablet (10 mg total) by mouth daily.   famotidine  (PEPCID ) 40 MG tablet Take 1 tablet (40 mg total) by mouth daily.   fexofenadine (ALLEGRA) 180 MG tablet Take 180 mg by mouth daily as needed for allergies.   fluticasone  (FLONASE ) 50 MCG/ACT nasal spray SPRAY 2 SPRAYS INTO EACH NOSTRIL EVERY DAY   gabapentin  (NEURONTIN ) 300 MG capsule Take 1 capsule (300 mg total) by mouth at bedtime.   Multiple Vitamins-Minerals (CENTRUM SILVER 50+WOMEN) TABS Take 1 tablet by mouth daily.   nitroGLYCERIN  (NITROSTAT ) 0.4 MG SL tablet Place 1 tablet (0.4 mg total) under the tongue every 5 (five) minutes as needed for chest pain.   pantoprazole  (PROTONIX ) 40 MG tablet Take 1 tablet (40 mg total) by mouth daily. Patient needs follow up appointment for future refills. Please call [phone] to schedule an appointment.   Probiotic Product (PROBIOTIC DAILY PO) Take 1 capsule by mouth daily.   prochlorperazine  (COMPAZINE ) 10 MG tablet Take 10 mg by mouth 2 (two) times daily as needed for nausea or vomiting.   No current facility-administered medications on file prior to visit.     Allergies  Allergen Reactions   Doxycycline  Shortness Of Breath and Swelling    Lips swelling and short of breath   Breo Ellipta  [Fluticasone  Furoate-Vilanterol] Other (See Comments)    Lower extremity weakness, lightheadedness    Demerol Hcl [Meperidine] Nausea And Vomiting   Fentanyl  Nausea And  Vomiting    Can tolerate with nausea meds  Pt states all pain medications cause N/V for her    Fluocinolone Nausea And Vomiting   Imdur  [Isosorbide  Nitrate] Nausea And Vomiting   Penicillins Hives    Review Of Systems:  Constitutional:   No  weight loss, night sweats,  Fevers, chills, fatigue, or  lassitude.  HEENT:   No headaches,  Difficulty swallowing,  Tooth/dental problems, or  Sore throat,                No sneezing, itching, ear ache, nasal congestion, post nasal drip,   CV:  No chest pain,  Orthopnea, PND, swelling in lower extremities, anasarca, dizziness, palpitations, syncope.   GI  No heartburn, indigestion, abdominal pain, nausea, vomiting, diarrhea, change in bowel habits, loss of appetite, bloody stools.   Resp: No shortness of breath with exertion or at rest.  No excess mucus, no productive cough,  No non-productive cough,  No coughing up of blood.  No change in  color of mucus.  No wheezing.  No chest wall deformity  Skin: no rash or lesions.  GU: no dysuria, change in color of urine, no urgency or frequency.  No flank pain, no hematuria   MS:  No joint pain or swelling.  No decreased range of motion.  No back pain.  Psych:  No change in mood or affect. No depression or anxiety.  No memory loss.   Vital Signs BP 123/75 (BP Location: Left Arm, Patient Position: Sitting, Cuff Size: Normal)   Pulse 87   Ht 5' 4 (1.626 m)   Wt 137 lb 6.4 oz (62.3 kg)   SpO2 96%   BMI 23.58 kg/m    Physical Exam:  General- No distress,  A&Ox3, pleasant  ENT: No sinus tenderness, TM clear, pale nasal mucosa, no oral exudate,no post nasal drip, no LAN Cardiac: S1, S2, regular rate and rhythm, no murmur Chest: No wheeze/ rales/ dullness; no accessory muscle use, no nasal flaring, no sternal retractions, slightly diminished per bases Abd.: Soft Non-tender, ND, BS +, Body mass index is 23.58 kg/m.  Ext: No clubbing cyanosis, edema, no obvious deformities Neuro:  normal  strength, MAE x 4, A&O x 3 , appropriate Skin: No rashes, warm and dry, no skin lesions  Psych: normal mood and behavior   Assessment/Plan Former smoker Dyspnea on exertion Plan I am glad you are doing so well from a respiratory standpoint. Start using your Stiolto 2 puffs in the morning every day without fail. This is a maintenance inhaler and will help you every day with your breathing. Use your albuterol  as needed for shortness of breath or wheezing This is your rescue inhaler You have quit smoking for 16 years, so you are no longer eligible for the screening program. Follow-up in 1 year, July 2026 with Dr. Kara to establish as a new patient as Dr. Brenna has left the practice Please call if you need us  sooner Continue walking on the treadmill as you have been doing Have a great rest of your year Please contact office for sooner follow up if symptoms do not improve or worsen or seek emergency care    I spent 35 minutes dedicated to the care of this patient on the date of this encounter to include pre-visit review of records, face-to-face time with the patient discussing conditions above, post visit ordering of testing, clinical documentation with the electronic health record, making appropriate referrals as documented, and communicating necessary information to the patient's healthcare team.   Lauraine JULIANNA Lites, NP 03/27/2024  10:19 AM

## 2024-03-28 ENCOUNTER — Other Ambulatory Visit: Payer: Self-pay | Admitting: Acute Care

## 2024-03-28 DIAGNOSIS — J42 Unspecified chronic bronchitis: Secondary | ICD-10-CM

## 2024-03-28 MED ORDER — SPIRIVA RESPIMAT 2.5 MCG/ACT IN AERS: 2.0000 | INHALATION_SPRAY | Freq: Every day | RESPIRATORY_TRACT | 12 refills | Status: AC

## 2024-03-28 NOTE — Telephone Encounter (Signed)
**Note De-identified  [place] Obfuscation** Please advise 

## 2024-04-09 ENCOUNTER — Encounter: Payer: Self-pay | Admitting: Family Medicine

## 2024-04-12 DIAGNOSIS — R3915 Urgency of urination: Secondary | ICD-10-CM | POA: Diagnosis not present

## 2024-04-12 DIAGNOSIS — R3914 Feeling of incomplete bladder emptying: Secondary | ICD-10-CM | POA: Diagnosis not present

## 2024-04-12 DIAGNOSIS — N3281 Overactive bladder: Secondary | ICD-10-CM | POA: Diagnosis not present

## 2024-04-17 ENCOUNTER — Ambulatory Visit: Admitting: Family Medicine

## 2024-04-18 ENCOUNTER — Other Ambulatory Visit: Payer: Self-pay | Admitting: Gastroenterology

## 2024-04-20 ENCOUNTER — Other Ambulatory Visit: Payer: Self-pay | Admitting: Gastroenterology

## 2024-04-23 ENCOUNTER — Ambulatory Visit (HOSPITAL_COMMUNITY)

## 2024-06-12 DIAGNOSIS — N952 Postmenopausal atrophic vaginitis: Secondary | ICD-10-CM | POA: Diagnosis not present

## 2024-06-12 DIAGNOSIS — R3915 Urgency of urination: Secondary | ICD-10-CM | POA: Diagnosis not present

## 2024-06-12 DIAGNOSIS — N3281 Overactive bladder: Secondary | ICD-10-CM | POA: Diagnosis not present

## 2024-06-15 ENCOUNTER — Other Ambulatory Visit: Payer: Self-pay | Admitting: Gastroenterology

## 2024-06-18 ENCOUNTER — Ambulatory Visit: Payer: Self-pay

## 2024-06-18 NOTE — Telephone Encounter (Signed)
 FYI Only or Action Required?: FYI only for provider.  Patient was last seen in primary care on 02/08/2024 by McGowen, Aleene DEL, MD.  Called Nurse Triage reporting Knee Pain and Joint Swelling.  Symptoms began 2 weeks ago.  Interventions attempted: Rest, hydration, or home remedies.  Symptoms are: stable.  Triage Disposition: See PCP When Office is Open (Within 3 Days)  Patient/caregiver understands and will follow disposition?: Yes                             Copied from CRM [id number]. Topic: Clinical - Red Word Triage >> Jun 18, 2024 10:24 AM Frederich PARAS wrote: [facility] that prompted transfer to Nurse Triage: swollen knee and pain  pt calling she hurt her left knee, she says left side of knee is swollen,  she says she is in pain, takes tylenol  at  night to try to get rid of the pain, have been in pain for about about 2weeks and swollen for 2 weeks, Reason for Disposition  [1] Limp when walking AND [2] due to a twisted knee  Answer Assessment - Initial Assessment Questions 1. MECHANISM: How did the injury happen? (e.g., twisting injury, direct blow)      States she started to walk backwards on the treadmill and fell 2. ONSET: When did the injury happen? (e.g., minutes, hours ago)      2 weeks ago 3. LOCATION: Where is the injury located?      Left knee 4. APPEARANCE of INJURY: What does the injury look like?      Mild swelling at this time, states swelling intensifies as she goes throughout her day, denies redness 5. SEVERITY: Can you put weight on that leg? Can you walk?      Yes, states she has to hobble when going down the stairs 6. SIZE: For cuts, bruises, or swelling, ask: How large is it? (e.g., inches or centimeters;  entire joint)      Denies bruising, denies bleeding, denies broken skin 7. PAIN: Is there pain? If Yes, ask: How bad is the pain?   What does it keep you from doing? (Scale 0-10; or none, mild, moderate, severe)      Rates pain a 0 at this time, pain exacerbates upon walking/climbing up the stairs  9. OTHER SYMPTOMS: Do you have any other symptoms?  (e.g., pop when knee injured, swelling, locking, buckling)      Denies additional symptoms at this time  Protocols used: Knee Injury-A-AH

## 2024-06-19 ENCOUNTER — Ambulatory Visit (INDEPENDENT_AMBULATORY_CARE_PROVIDER_SITE_OTHER): Admitting: Family Medicine

## 2024-06-19 ENCOUNTER — Encounter: Payer: Self-pay | Admitting: Family Medicine

## 2024-06-19 VITALS — BP 96/63 | HR 79 | Temp 97.7°F | Ht 64.0 in | Wt 141.6 lb

## 2024-06-19 DIAGNOSIS — M25562 Pain in left knee: Secondary | ICD-10-CM

## 2024-06-19 DIAGNOSIS — S39012A Strain of muscle, fascia and tendon of lower back, initial encounter: Secondary | ICD-10-CM | POA: Diagnosis not present

## 2024-06-19 DIAGNOSIS — S8392XA Sprain of unspecified site of left knee, initial encounter: Secondary | ICD-10-CM

## 2024-06-19 MED ORDER — PRUCALOPRIDE SUCCINATE 2 MG PO TABS: ORAL_TABLET | ORAL | 0 refills | Status: DC

## 2024-06-19 NOTE — Progress Notes (Signed)
 OFFICE VISIT  06/19/2024  CC:  Chief Complaint  Patient presents with   Knee Pain    Left, injury 2 weeks ago while using treadmill and fell ; some swelling; takes tylenol  at night for pain     Patient is a 73 y.o. female who presents for left knee concern.  HPI: About 2 weeks ago [name] was walking backwards on a treadmill.  She quickly lost her balance and twisted her left knee and fell on her left side of her back and buttocks. The knee has swollen a little bit.  No bruising.  No buckling or locking up. She has continued to walk on her treadmill, although limping. She also hurts in the left low back and buttock region.  She has never had any past problems with her knees.  Past Medical History:  Diagnosis Date   Abdominal bruit 04/2017   Aortic u/s showed NO ANEURISM.   Anemia    low iron   Anxiety    CAD (coronary artery disease) 07/17/2023   -Cardiac catheterization 02/08/12: no CAD  -Myoview  02/05/2019: EF 56, no ischemia, low risk -CCTA 12/21/20: CAC score 907 (97th percentile); LM 0-24, LAD prox 25-49, RCA 50-69; FFR normal    Carotid artery occlusion    hx L carotid stent   Cataract    COPD (chronic obstructive pulmonary disease) (HCC)    DOE, worse since 11/2018.  Stress test neg 01/2019.  Albut x 1 inh prn helpful some.  Pulm 06/2019, PFTs not diag of COPD, DOE likely from deconditioning.   Coronary artery disease    Elevated transaminase level    Very mild, 2024.  Liver normal on ultrasound.   Essential hypertension, benign 05/22/2014   Gastroparesis 12/2022   delayed emptying on scan->reglan  (Dr. Shyrl)   GERD (gastroesophageal reflux disease)    + hx of esophagitis   Bertrum syndrome    H/O hiatal hernia 02/2019   EGD-->moderate size->surgery recommended but pt declined.   Hematochezia 2020/21   internal hemorrhoids   History of iron deficiency anemia 07/2019   Dr. Terrill EGD and colonoscopy, some anorectal muscosal ulceration, o/w just mild  chronic gastritis.   Also could be poor absorption due to chronic high dose acid suppression. Hb/iron normalized aft iron infus and ongoing oral iron.   History of kidney stones    one time   ILD (interstitial lung disease) (HCC)    Detected on lung cancer screening CT 01/2022.  ILD blood panel all normal except weakly + myositis ab's.  Pt to be evaluated by Dr. Geronimo asymptomatic as of 03/2022.   Mitral valve prolapse    Mixed hyperlipidemia 05/22/2014   OAB (overactive bladder)    detrol LA from urol helpful   Osteopenia 06/06/2016   05/2016 DEXA T-score -1.5.  06/2018 DEXA T score -1.1.  T score -1.2 06/2021   Peripheral vascular disease, unspecified 05/22/2014   innominate and carotid.  U/S f/u 12/2016 showed no signif change compared to 2017--vascular recommended repeat 1 yr.  Doing well as of 08/2018 cardiol f/u-->continued on ASA and Plavix . F/u 08/2019->ASA d/c'd, plavix  continued.   Prediabetes 2018   2018 A1c 6.3%.  05/2018 A1c 6.1%. 07/2019 A1c 6.4%.   Raynaud disease    Seasonal allergies    TIA (transient ischemic attack) 2006    Past Surgical History:  Procedure Laterality Date   ABDOMINAL HYSTERECTOMY  1997   CARDIAC CATHETERIZATION  01/2012   NORMAL   CARDIOVASCULAR STRESS TEST  02/05/2019  NORMAL.  EF normal.   CAROTID ENDARTERECTOMY  08/21/2007   right - Dr. Dyane   CHOLECYSTECTOMY  2000   COLONOSCOPY  approx 2008; 09/23/19   2008 (Dr. Gareld pt->normal.  08/2019 adenoma, some friable areas of colon + anorectal mucosa inflam w/ ulceration, +diverticulosis. Recall 5 yrs   DEXA  06/06/2016   05/2016 T-score -1.5.  06/2018 T score -1.1.  T score -1.2 06/2021.   ESOPHAGOGASTRODUODENOSCOPY  03/11/2019; 09/23/19   02/2019; 5 cm hiatal hernia; GI suggested surgical repair due to intractable GER.  08/2019 mild chronic gastritis, h pylori neg, multiple benign bx's including duodenum neg for celiac changes.   ESOPHAGOGASTRODUODENOSCOPY N/A 01/18/2021   Procedure:  ESOPHAGOGASTRODUODENOSCOPY (EGD);  Surgeon: Shyrl Linnie KIDD, MD;  Location: [facility] OR;  Service: Thoracic;  Laterality: N/A;   ESOPHAGOGASTRODUODENOSCOPY N/A 05/12/2022   Procedure: ESOPHAGOGASTRODUODENOSCOPY (EGD);  Surgeon: Shyrl Linnie KIDD, MD;  Location: [facility] OR;  Service: Thoracic;  Laterality: N/A;   Incision and drainage of left thenar space abscess  02/23/2015   s/p cat bite (Dr. Camella)   innominate stent  2007   12/2014 u/s showed patent stent in innominate and L common carotid.  L prox int carotid 40-59% stenosis   LDCT for lung ca screening  06/2019   NEG->rpt 1 yr   Left common carotid stent  2006 and 2007                                                                                                   PEXY N/A 05/12/2022   Procedure: GASTROPEXY;  Surgeon: Shyrl Linnie KIDD, MD;  Location: [facility] OR;  Service: Thoracic;  Laterality: N/A;   SINOSCOPY     TRANSTHORACIC ECHOCARDIOGRAM  2007   NORMAL   TUBAL LIGATION     UPPER GI ENDOSCOPY  10/13/2023   with biopsies   US  CAROTID DOPPLER BILATERAL ([facility] HX)  06/2019   left stent w/out stenosis.  R ICA sten 40-50% obst-stable.  07/20/21 R clear, L 40-59%, normal vert and subclav flow.  07/2022 stable   XI ROBOTIC ASSISTED HIATAL HERNIA REPAIR N/A 05/12/2022   Procedure: XI ROBOTIC ASSISTED HIATAL HERNIA REPAIR;  Surgeon: Shyrl Linnie KIDD, MD;  Location: [facility] OR;  Service: Thoracic;  Laterality: N/A;   XI ROBOTIC ASSISTED PARAESOPHAGEAL HERNIA REPAIR N/A 01/18/2021   Procedure: XI ROBOTIC ASSISTED PARAESOPHAGEAL HERNIA REPAIR WITH FUNDOPLICATION WITH MESH;  Surgeon: Shyrl Linnie KIDD, MD;  Location: [facility] OR;  Service: Thoracic;  Laterality: N/A;    Outpatient Medications Prior to Visit  Medication Sig Dispense Refill   acetaminophen  (TYLENOL ) 325 MG tablet Take 2 tablets (650 mg total) by mouth every 6 (six) hours as needed for mild pain or moderate pain.     albuterol  (VENTOLIN  HFA) 108 (90 Base) MCG/ACT inhaler Inhale  2 puffs into the lungs every 6 (six) hours as needed for wheezing or shortness of breath. 8 g 6   atorvastatin  (LIPITOR) 80 MG tablet Take 1 tablet (80 mg total) by mouth daily. 90 tablet 3   Calcium -Magnesium-Vitamin D (CALCIUM  1200+D3 PO) Take 1 tablet by mouth daily.  clopidogrel  (PLAVIX ) 75 MG tablet Take 1 tablet (75 mg total) by mouth daily. 90 tablet 3   diazepam  (VALIUM ) 5 MG tablet 1 tab po bid as needed for anxiety 60 tablet 5   dicyclomine (BENTYL) 10 MG capsule Take 10 mg by mouth 4 (four) times daily as needed for spasms.     estradiol (ESTRACE) 0.1 MG/GM vaginal cream Place 1 Applicatorful vaginally 2 (two) times a week.     ezetimibe  (ZETIA ) 10 MG tablet Take 1 tablet (10 mg total) by mouth daily. 90 tablet 3   famotidine  (PEPCID ) 40 MG tablet Take 1 tablet (40 mg total) by mouth daily. 90 tablet 3   fexofenadine (ALLEGRA) 180 MG tablet Take 180 mg by mouth daily as needed for allergies.     fluticasone  (FLONASE ) 50 MCG/ACT nasal spray SPRAY 2 SPRAYS INTO EACH NOSTRIL EVERY DAY 48 mL 1   gabapentin  (NEURONTIN ) 300 MG capsule Take 1 capsule (300 mg total) by mouth at bedtime. 90 capsule 3   Multiple Vitamins-Minerals (CENTRUM SILVER 50+WOMEN) TABS Take 1 tablet by mouth daily.     nitroGLYCERIN  (NITROSTAT ) 0.4 MG SL tablet Place 1 tablet (0.4 mg total) under the tongue every 5 (five) minutes as needed for chest pain. 25 tablet 6   pantoprazole  (PROTONIX ) 40 MG tablet Take 1 tablet (40 mg total) by mouth daily. Patient needs follow up appointment for future refills. Please call [phone] to schedule an appointment. 30 tablet 0   Probiotic Product (PROBIOTIC DAILY PO) Take 1 capsule by mouth daily.     prochlorperazine  (COMPAZINE ) 10 MG tablet Take 10 mg by mouth 2 (two) times daily as needed for nausea or vomiting.     Tiotropium Bromide  Monohydrate (SPIRIVA  RESPIMAT) 2.5 MCG/ACT AERS Inhale 2 puffs into the lungs daily. 1 each 12   No facility-administered medications prior to  visit.    Allergies  Allergen Reactions   Doxycycline  Shortness Of Breath and Swelling    Lips swelling and short of breath   Breo Ellipta  [Fluticasone  Furoate-Vilanterol] Other (See Comments)    Lower extremity weakness, lightheadedness    Demerol Hcl [Meperidine] Nausea And Vomiting   Fentanyl  Nausea And Vomiting    Can tolerate with nausea meds  Pt states all pain medications cause N/V for her    Fluocinolone Nausea And Vomiting   Imdur  [Isosorbide  Nitrate] Nausea And Vomiting   Penicillins Hives    Review of Systems  As per HPI  PE:    06/19/2024    2:42 PM 03/27/2024   10:15 AM 03/04/2024    3:08 PM  Vitals with BMI  Height 5' 4 5' 4   Weight 141 lbs 10 oz 137 lbs 6 oz   BMI 24.29 23.57   Systolic 96 123 159  Diastolic 63 75 75  Pulse 79 87      Physical Exam  General: Alert and well-appearing. She has mild tenderness to palpation over the left ilium/gluteus.  No midline low back tenderness. Left knee shows a little bit of soft tissue swelling laterally.  Range of motion fully intact.  She has no erythema or warmth. Slight tenderness to palpation over the lateral femoral condyle region. No joint line tenderness.  No laxity or reproduction of her pain with varus or valgus stressing. Anterior and posterior drawer with good stability.  LABS:  Last CBC Lab Results  Component Value Date   WBC 8.5 01/28/2024   HGB 15.0 01/28/2024   HCT 44.0 01/28/2024   MCV 92.7  01/28/2024   MCH 31.4 01/28/2024   RDW 13.2 01/28/2024   PLT 213 01/28/2024   Last metabolic panel Lab Results  Component Value Date   GLUCOSE 121 (H) 01/28/2024   NA 142 01/28/2024   K 3.8 01/28/2024   CL 107 01/28/2024   CO2 23 01/28/2024   BUN 18 01/28/2024   CREATININE 0.70 01/28/2024   GFRNONAA >60 01/28/2024   CALCIUM  9.3 01/28/2024   PROT 6.8 01/28/2024   ALBUMIN  3.9 01/28/2024   BILITOT 1.7 (H) 01/28/2024   ALKPHOS 124 01/28/2024   AST 61 (H) 01/28/2024   ALT 78 (H) 01/28/2024    ANIONGAP 8 01/28/2024   IMPRESSION AND PLAN:  #1 nonspecific left knee sprain/strain. Low suspicion of any significant internal derangement. Hinged knee brace recommended.  No prolonged standing or walking for 1 week (work note given). Ice 20 minutes a day.  Tylenol  1000 mg twice daily as needed.  #2 low back strain/contusion. Reassured. Signs/symptoms to call or return for were reviewed and pt expressed understanding.  An After Visit Summary was printed and given to the patient.  FOLLOW UP: Return in about 2 weeks (around 07/03/2024) for f/u L knee sprain.  Signed:  Gerlene Hockey, MD           06/19/2024

## 2024-06-29 ENCOUNTER — Other Ambulatory Visit: Payer: Self-pay | Admitting: Family Medicine

## 2024-07-03 ENCOUNTER — Ambulatory Visit: Admitting: Family Medicine

## 2024-07-03 ENCOUNTER — Encounter: Payer: Self-pay | Admitting: Family Medicine

## 2024-07-03 VITALS — BP 113/67 | HR 92 | Temp 98.0°F | Ht 64.0 in | Wt 144.0 lb

## 2024-07-03 DIAGNOSIS — S8392XA Sprain of unspecified site of left knee, initial encounter: Secondary | ICD-10-CM | POA: Diagnosis not present

## 2024-07-03 DIAGNOSIS — M65841 Other synovitis and tenosynovitis, right hand: Secondary | ICD-10-CM

## 2024-07-03 NOTE — Progress Notes (Signed)
 OFFICE VISIT  07/03/2024  CC:  Chief Complaint  Patient presents with   Knee Pain    2 week f/u; pt states she is feeling much better; still doing Tylenol , last dose yesterday. Was using knee brace, last used Fri    Patient is a 73 y.o. female who presents for 2-week follow-up left knee sprain. A/P as of last visit: #1 nonspecific left knee sprain/strain. Low suspicion of any significant internal derangement. Hinged knee brace recommended.  No prolonged standing or walking for 1 week (work note given). Ice 20 minutes a day.  Tylenol  1000 mg twice daily as needed.   #2 low back strain/contusion. Reassured. Signs/symptoms to call or return for were reviewed and pt expressed understanding.  INTERIM HX: Knee feels much much better.  Points to a little bit of aching in the lateral aspect. She wore the brace up until 4 to 5 days ago. She returned to work 2 days ago.  She has had stiffness and triggering of the right ring finger for quite a while.  It does not trigger during the day and does not hurt much at all during the day but at night it sometimes wakes her up in the middle of the night with sharp pain and she has to straighten it out by pulling with the other hand.  Past Medical History:  Diagnosis Date   Abdominal bruit 04/2017   Aortic u/s showed NO ANEURISM.   Anemia    low iron   Anxiety    CAD (coronary artery disease) 07/17/2023   -Cardiac catheterization 02/08/12: no CAD  -Myoview  02/05/2019: EF 56, no ischemia, low risk -CCTA 12/21/20: CAC score 907 (97th percentile); LM 0-24, LAD prox 25-49, RCA 50-69; FFR normal    Carotid artery occlusion    hx L carotid stent   Cataract    COPD (chronic obstructive pulmonary disease) (HCC)    DOE, worse since 11/2018.  Stress test neg 01/2019.  Albut x 1 inh prn helpful some.  Pulm 06/2019, PFTs not diag of COPD, DOE likely from deconditioning.   Coronary artery disease    Elevated transaminase level    Very mild, 2024.  Liver  normal on ultrasound.   Essential hypertension, benign 05/22/2014   Gastroparesis 12/2022   delayed emptying on scan->reglan  (Dr. Shyrl)   GERD (gastroesophageal reflux disease)    + hx of esophagitis   Bertrum syndrome    H/O hiatal hernia 02/2019   EGD-->moderate size->surgery recommended but pt declined.   Hematochezia 2020/21   internal hemorrhoids   History of iron deficiency anemia 07/2019   Dr. Terrill EGD and colonoscopy, some anorectal muscosal ulceration, o/w just mild chronic gastritis.   Also could be poor absorption due to chronic high dose acid suppression. Hb/iron normalized aft iron infus and ongoing oral iron.   History of kidney stones    one time   ILD (interstitial lung disease) (HCC)    Detected on lung cancer screening CT 01/2022.  ILD blood panel all normal except weakly + myositis ab's.  Pt to be evaluated by Dr. Geronimo asymptomatic as of 03/2022.   Mitral valve prolapse    Mixed hyperlipidemia 05/22/2014   OAB (overactive bladder)    detrol LA from urol helpful   Osteopenia 06/06/2016   05/2016 DEXA T-score -1.5.  06/2018 DEXA T score -1.1.  T score -1.2 06/2021   Peripheral vascular disease, unspecified 05/22/2014   innominate and carotid.  U/S f/u 12/2016 showed no signif change compared to  2017--vascular recommended repeat 1 yr.  Doing well as of 08/2018 cardiol f/u-->continued on ASA and Plavix . F/u 08/2019->ASA d/c'd, plavix  continued.   Prediabetes 2018   2018 A1c 6.3%.  05/2018 A1c 6.1%. 07/2019 A1c 6.4%.   Raynaud disease    Seasonal allergies    TIA (transient ischemic attack) 2006    Past Surgical History:  Procedure Laterality Date   ABDOMINAL HYSTERECTOMY  1997   CARDIAC CATHETERIZATION  01/2012   NORMAL   CARDIOVASCULAR STRESS TEST  02/05/2019   NORMAL.  EF normal.   CAROTID ENDARTERECTOMY  08/21/2007   right - Dr. Dyane   CHOLECYSTECTOMY  2000   COLONOSCOPY  approx 2008; 09/23/19   2008 (Dr. Gareld pt->normal.  08/2019  adenoma, some friable areas of colon + anorectal mucosa inflam w/ ulceration, +diverticulosis. Recall 5 yrs   DEXA  06/06/2016   05/2016 T-score -1.5.  06/2018 T score -1.1.  T score -1.2 06/2021.   ESOPHAGOGASTRODUODENOSCOPY  03/11/2019; 09/23/19   02/2019; 5 cm hiatal hernia; GI suggested surgical repair due to intractable GER.  08/2019 mild chronic gastritis, h pylori neg, multiple benign bx's including duodenum neg for celiac changes.   ESOPHAGOGASTRODUODENOSCOPY N/A 01/18/2021   Procedure: ESOPHAGOGASTRODUODENOSCOPY (EGD);  Surgeon: Shyrl Linnie KIDD, MD;  Location: [facility] OR;  Service: Thoracic;  Laterality: N/A;   ESOPHAGOGASTRODUODENOSCOPY N/A 05/12/2022   Procedure: ESOPHAGOGASTRODUODENOSCOPY (EGD);  Surgeon: Shyrl Linnie KIDD, MD;  Location: [facility] OR;  Service: Thoracic;  Laterality: N/A;   Incision and drainage of left thenar space abscess  02/23/2015   s/p cat bite (Dr. Camella)   innominate stent  2007   12/2014 u/s showed patent stent in innominate and L common carotid.  L prox int carotid 40-59% stenosis   LDCT for lung ca screening  06/2019   NEG->rpt 1 yr   Left common carotid stent  2006 and 2007                                                                                                   PEXY N/A 05/12/2022   Procedure: GASTROPEXY;  Surgeon: Shyrl Linnie KIDD, MD;  Location: [facility] OR;  Service: Thoracic;  Laterality: N/A;   SINOSCOPY     TRANSTHORACIC ECHOCARDIOGRAM  2007   NORMAL   TUBAL LIGATION     UPPER GI ENDOSCOPY  10/13/2023   with biopsies   US  CAROTID DOPPLER BILATERAL ([facility] HX)  06/2019   left stent w/out stenosis.  R ICA sten 40-50% obst-stable.  07/20/21 R clear, L 40-59%, normal vert and subclav flow.  07/2022 stable   XI ROBOTIC ASSISTED HIATAL HERNIA REPAIR N/A 05/12/2022   Procedure: XI ROBOTIC ASSISTED HIATAL HERNIA REPAIR;  Surgeon: Shyrl Linnie KIDD, MD;  Location: [facility] OR;  Service: Thoracic;  Laterality: N/A;   XI ROBOTIC ASSISTED  PARAESOPHAGEAL HERNIA REPAIR N/A 01/18/2021   Procedure: XI ROBOTIC ASSISTED PARAESOPHAGEAL HERNIA REPAIR WITH FUNDOPLICATION WITH MESH;  Surgeon: Shyrl Linnie KIDD, MD;  Location: [facility] OR;  Service: Thoracic;  Laterality: N/A;    Outpatient Medications Prior to Visit  Medication Sig Dispense Refill   acetaminophen  (  TYLENOL ) 325 MG tablet Take 2 tablets (650 mg total) by mouth every 6 (six) hours as needed for mild pain or moderate pain.     albuterol  (VENTOLIN  HFA) 108 (90 Base) MCG/ACT inhaler Inhale 2 puffs into the lungs every 6 (six) hours as needed for wheezing or shortness of breath. 8 g 6   atorvastatin  (LIPITOR) 80 MG tablet Take 1 tablet (80 mg total) by mouth daily. 90 tablet 3   Calcium -Magnesium-Vitamin D (CALCIUM  1200+D3 PO) Take 1 tablet by mouth daily.     clopidogrel  (PLAVIX ) 75 MG tablet Take 1 tablet (75 mg total) by mouth daily. 90 tablet 3   diazepam  (VALIUM ) 5 MG tablet 1 tab po bid as needed for anxiety 60 tablet 5   dicyclomine (BENTYL) 10 MG capsule Take 10 mg by mouth 4 (four) times daily as needed for spasms.     estradiol (ESTRACE) 0.1 MG/GM vaginal cream Place 1 Applicatorful vaginally 2 (two) times a week.     ezetimibe  (ZETIA ) 10 MG tablet Take 1 tablet (10 mg total) by mouth daily. 90 tablet 3   famotidine  (PEPCID ) 40 MG tablet Take 1 tablet (40 mg total) by mouth daily. 90 tablet 3   fexofenadine (ALLEGRA) 180 MG tablet Take 180 mg by mouth daily as needed for allergies.     fluticasone  (FLONASE ) 50 MCG/ACT nasal spray SPRAY 2 SPRAYS INTO EACH NOSTRIL EVERY DAY 48 mL 0   gabapentin  (NEURONTIN ) 300 MG capsule Take 1 capsule (300 mg total) by mouth at bedtime. 90 capsule 3   Multiple Vitamins-Minerals (CENTRUM SILVER 50+WOMEN) TABS Take 1 tablet by mouth daily.     nitroGLYCERIN  (NITROSTAT ) 0.4 MG SL tablet Place 1 tablet (0.4 mg total) under the tongue every 5 (five) minutes as needed for chest pain. 25 tablet 6   pantoprazole  (PROTONIX ) 40 MG tablet Take 1  tablet (40 mg total) by mouth daily. Patient needs follow up appointment for future refills. Please call [phone] to schedule an appointment. 30 tablet 0   Probiotic Product (PROBIOTIC DAILY PO) Take 1 capsule by mouth daily.     prochlorperazine  (COMPAZINE ) 10 MG tablet Take 10 mg by mouth 2 (two) times daily as needed for nausea or vomiting.     Prucalopride Succinate  (MOTEGRITY ) 2 MG TABS 1 tab po qd 90 tablet 0   Tiotropium Bromide  Monohydrate (SPIRIVA  RESPIMAT) 2.5 MCG/ACT AERS Inhale 2 puffs into the lungs daily. 1 each 12   No facility-administered medications prior to visit.    Allergies  Allergen Reactions   Doxycycline  Shortness Of Breath and Swelling    Lips swelling and short of breath   Breo Ellipta  [Fluticasone  Furoate-Vilanterol] Other (See Comments)    Lower extremity weakness, lightheadedness    Demerol Hcl [Meperidine] Nausea And Vomiting   Fentanyl  Nausea And Vomiting    Can tolerate with nausea meds  Pt states all pain medications cause N/V for her    Fluocinolone Nausea And Vomiting   Imdur  [Isosorbide  Nitrate] Nausea And Vomiting   Penicillins Hives    Review of Systems As per HPI  PE:    07/03/2024    2:22 PM 06/19/2024    2:42 PM 03/27/2024   10:15 AM  Vitals with BMI  Height 5' 4 5' 4 5' 4  Weight 144 lbs 141 lbs 10 oz 137 lbs 6 oz  BMI 24.71 24.29 23.57  Systolic 113 96 123  Diastolic 67 63 75  Pulse 92 79 87     Physical Exam  General: Alert and well-appearing patient Left knee: No knee swelling, warmth, or erythema.  She has a bit of focal tenderness over the lateral aspect, essentially over the IT band. No popping or snapping.  No knee instability.  She has full range of motion without pain. Right hand ring finger with palpable nodularity of the flexor tendon over the A1 pulley region.  Audible popping can be appreciated.  LABS:  Last CBC Lab Results  Component Value Date   WBC 8.5 01/28/2024   HGB 15.0 01/28/2024   HCT 44.0  01/28/2024   MCV 92.7 01/28/2024   MCH 31.4 01/28/2024   RDW 13.2 01/28/2024   PLT 213 01/28/2024   Last metabolic panel Lab Results  Component Value Date   GLUCOSE 121 (H) 01/28/2024   NA 142 01/28/2024   K 3.8 01/28/2024   CL 107 01/28/2024   CO2 23 01/28/2024   BUN 18 01/28/2024   CREATININE 0.70 01/28/2024   GFRNONAA >60 01/28/2024   CALCIUM  9.3 01/28/2024   PROT 6.8 01/28/2024   ALBUMIN  3.9 01/28/2024   BILITOT 1.7 (H) 01/28/2024   ALKPHOS 124 01/28/2024   AST 61 (H) 01/28/2024   ALT 78 (H) 01/28/2024   ANIONGAP 8 01/28/2024   IMPRESSION AND PLAN:  #1 left knee sprain, improving appropriately with conservative management.  2.  Right ring finger stenosing tenosynovitis, some triggering--> only bothers her at night at this point. Finger splint fitted and dispensed today. If worsening then we can do steroid injection.  An After Visit Summary was printed and given to the patient.  FOLLOW UP: No follow-ups on file.  Signed:  Gerlene Hockey, MD           07/03/2024

## 2024-07-16 ENCOUNTER — Encounter: Payer: Self-pay | Admitting: Cardiovascular Disease

## 2024-07-16 ENCOUNTER — Ambulatory Visit: Attending: Cardiovascular Disease | Admitting: Cardiovascular Disease

## 2024-07-16 VITALS — BP 118/60 | HR 80 | Ht 64.0 in | Wt 140.0 lb

## 2024-07-16 DIAGNOSIS — I2510 Atherosclerotic heart disease of native coronary artery without angina pectoris: Secondary | ICD-10-CM | POA: Diagnosis not present

## 2024-07-16 DIAGNOSIS — I739 Peripheral vascular disease, unspecified: Secondary | ICD-10-CM | POA: Diagnosis not present

## 2024-07-16 DIAGNOSIS — I10 Essential (primary) hypertension: Secondary | ICD-10-CM

## 2024-07-16 DIAGNOSIS — E782 Mixed hyperlipidemia: Secondary | ICD-10-CM

## 2024-07-16 DIAGNOSIS — I6523 Occlusion and stenosis of bilateral carotid arteries: Secondary | ICD-10-CM

## 2024-07-16 NOTE — Patient Instructions (Signed)

## 2024-07-16 NOTE — Progress Notes (Signed)
 Chief Complaint  Patient presents with   Follow-up    CAD   History of Present Illness: 73 yo female with history of CAD, PAD, Raynaud's disease, carotid artery disease, prior TIA, HTN, HLD, COPD and interstitial lung disease who is here today for follow up. She had been followed in our office by Dr. Dann. She had right carotid endarterectomy in 2008, left carotid stenting in 2006 and right innominate artery stent in 2007. Her carotid disease is followed in [facility] by Dr. Serene. Carotid artery dopplers June 2025 with stable disease. She has CAD by coronary CTA in March 2022 (mild left main plaque, mild to moderate LAD stenosis, moderate RCA stenosis).   She is here today for follow up. The patient denies any chest pain, dyspnea, palpitations, lower extremity edema, orthopnea, PND, dizziness, near syncope or syncope.   Primary Care Physician: Candise Aleene DEL, MD   Past Medical History:  Diagnosis Date   Abdominal bruit 04/2017   Aortic u/s showed NO ANEURISM.   Anemia    low iron   Anxiety    CAD (coronary artery disease) 07/17/2023   -Cardiac catheterization 02/08/12: no CAD  -Myoview  02/05/2019: EF 56, no ischemia, low risk -CCTA 12/21/20: CAC score 907 (97th percentile); LM 0-24, LAD prox 25-49, RCA 50-69; FFR normal    Carotid artery occlusion    hx L carotid stent   Cataract    COPD (chronic obstructive pulmonary disease) (HCC)    DOE, worse since 11/2018.  Stress test neg 01/2019.  Albut x 1 inh prn helpful some.  Pulm 06/2019, PFTs not diag of COPD, DOE likely from deconditioning.   Coronary artery disease    Elevated transaminase level    Very mild, 2024.  Liver normal on ultrasound.   Essential hypertension, benign 05/22/2014   Gastroparesis 12/2022   delayed emptying on scan->reglan  (Dr. Shyrl)   GERD (gastroesophageal reflux disease)    + hx of esophagitis   Bertrum syndrome    H/O hiatal hernia 02/2019   EGD-->moderate size->surgery recommended but pt  declined.   Hematochezia 2020/21   internal hemorrhoids   History of iron deficiency anemia 07/2019   Dr. Terrill EGD and colonoscopy, some anorectal muscosal ulceration, o/w just mild chronic gastritis.   Also could be poor absorption due to chronic high dose acid suppression. Hb/iron normalized aft iron infus and ongoing oral iron.   History of kidney stones    one time   ILD (interstitial lung disease) (HCC)    Detected on lung cancer screening CT 01/2022.  ILD blood panel all normal except weakly + myositis ab's.  Pt to be evaluated by Dr. Geronimo asymptomatic as of 03/2022.   Mitral valve prolapse    Mixed hyperlipidemia 05/22/2014   OAB (overactive bladder)    detrol LA from urol helpful   Osteopenia 06/06/2016   05/2016 DEXA T-score -1.5.  06/2018 DEXA T score -1.1.  T score -1.2 06/2021   Peripheral vascular disease, unspecified 05/22/2014   innominate and carotid.  U/S f/u 12/2016 showed no signif change compared to 2017--vascular recommended repeat 1 yr.  Doing well as of 08/2018 cardiol f/u-->continued on ASA and Plavix . F/u 08/2019->ASA d/c'd, plavix  continued.   Prediabetes 2018   2018 A1c 6.3%.  05/2018 A1c 6.1%. 07/2019 A1c 6.4%.   Raynaud disease    Seasonal allergies    TIA (transient ischemic attack) 2006    Past Surgical History:  Procedure Laterality Date   ABDOMINAL HYSTERECTOMY  1997  CARDIAC CATHETERIZATION  01/2012   NORMAL   CARDIOVASCULAR STRESS TEST  02/05/2019   NORMAL.  EF normal.   CAROTID ENDARTERECTOMY  08/21/2007   right - Dr. Dyane   CHOLECYSTECTOMY  2000   COLONOSCOPY  approx 2008; 09/23/19   2008 (Dr. Gareld pt->normal.  08/2019 adenoma, some friable areas of colon + anorectal mucosa inflam w/ ulceration, +diverticulosis. Recall 5 yrs   DEXA  06/06/2016   05/2016 T-score -1.5.  06/2018 T score -1.1.  T score -1.2 06/2021.   ESOPHAGOGASTRODUODENOSCOPY  03/11/2019; 09/23/19   02/2019; 5 cm hiatal hernia; GI suggested surgical repair due to  intractable GER.  08/2019 mild chronic gastritis, h pylori neg, multiple benign bx's including duodenum neg for celiac changes.   ESOPHAGOGASTRODUODENOSCOPY N/A 01/18/2021   Procedure: ESOPHAGOGASTRODUODENOSCOPY (EGD);  Surgeon: Shyrl Linnie KIDD, MD;  Location: [facility] OR;  Service: Thoracic;  Laterality: N/A;   ESOPHAGOGASTRODUODENOSCOPY N/A 05/12/2022   Procedure: ESOPHAGOGASTRODUODENOSCOPY (EGD);  Surgeon: Shyrl Linnie KIDD, MD;  Location: [facility] OR;  Service: Thoracic;  Laterality: N/A;   Incision and drainage of left thenar space abscess  02/23/2015   s/p cat bite (Dr. Camella)   innominate stent  2007   12/2014 u/s showed patent stent in innominate and L common carotid.  L prox int carotid 40-59% stenosis   LDCT for lung ca screening  06/2019   NEG->rpt 1 yr   Left common carotid stent  2006 and 2007                                                                                                   PEXY N/A 05/12/2022   Procedure: GASTROPEXY;  Surgeon: Shyrl Linnie KIDD, MD;  Location: [facility] OR;  Service: Thoracic;  Laterality: N/A;   SINOSCOPY     TRANSTHORACIC ECHOCARDIOGRAM  2007   NORMAL   TUBAL LIGATION     UPPER GI ENDOSCOPY  10/13/2023   with biopsies   US  CAROTID DOPPLER BILATERAL ([facility] HX)  06/2019   left stent w/out stenosis.  R ICA sten 40-50% obst-stable.  07/20/21 R clear, L 40-59%, normal vert and subclav flow.  07/2022 stable   XI ROBOTIC ASSISTED HIATAL HERNIA REPAIR N/A 05/12/2022   Procedure: XI ROBOTIC ASSISTED HIATAL HERNIA REPAIR;  Surgeon: Shyrl Linnie KIDD, MD;  Location: [facility] OR;  Service: Thoracic;  Laterality: N/A;   XI ROBOTIC ASSISTED PARAESOPHAGEAL HERNIA REPAIR N/A 01/18/2021   Procedure: XI ROBOTIC ASSISTED PARAESOPHAGEAL HERNIA REPAIR WITH FUNDOPLICATION WITH MESH;  Surgeon: Shyrl Linnie KIDD, MD;  Location: [facility] OR;  Service: Thoracic;  Laterality: N/A;    Current Outpatient Medications  Medication Sig Dispense Refill   acetaminophen  (TYLENOL )  325 MG tablet Take 2 tablets (650 mg total) by mouth every 6 (six) hours as needed for mild pain or moderate pain.     albuterol  (VENTOLIN  HFA) 108 (90 Base) MCG/ACT inhaler Inhale 2 puffs into the lungs every 6 (six) hours as needed for wheezing or shortness of breath. 8 g 6   atorvastatin  (LIPITOR) 80 MG tablet Take 1 tablet (80 mg total) by mouth daily. 90 tablet  3   Calcium -Magnesium-Vitamin D (CALCIUM  1200+D3 PO) Take 1 tablet by mouth daily.     clopidogrel  (PLAVIX ) 75 MG tablet Take 1 tablet (75 mg total) by mouth daily. 90 tablet 3   diazepam  (VALIUM ) 5 MG tablet 1 tab po bid as needed for anxiety 60 tablet 5   dicyclomine (BENTYL) 10 MG capsule Take 10 mg by mouth 4 (four) times daily as needed for spasms.     estradiol (ESTRACE) 0.1 MG/GM vaginal cream Place 1 Applicatorful vaginally 2 (two) times a week.     ezetimibe  (ZETIA ) 10 MG tablet Take 1 tablet (10 mg total) by mouth daily. 90 tablet 3   famotidine  (PEPCID ) 40 MG tablet Take 1 tablet (40 mg total) by mouth daily. 90 tablet 3   fexofenadine (ALLEGRA) 180 MG tablet Take 180 mg by mouth daily as needed for allergies.     fluticasone  (FLONASE ) 50 MCG/ACT nasal spray SPRAY 2 SPRAYS INTO EACH NOSTRIL EVERY DAY 48 mL 0   gabapentin  (NEURONTIN ) 300 MG capsule Take 1 capsule (300 mg total) by mouth at bedtime. 90 capsule 3   Multiple Vitamins-Minerals (CENTRUM SILVER 50+WOMEN) TABS Take 1 tablet by mouth daily.     nitroGLYCERIN  (NITROSTAT ) 0.4 MG SL tablet Place 1 tablet (0.4 mg total) under the tongue every 5 (five) minutes as needed for chest pain. 25 tablet 6   pantoprazole  (PROTONIX ) 40 MG tablet Take 1 tablet (40 mg total) by mouth daily. Patient needs follow up appointment for future refills. Please call [phone] to schedule an appointment. 30 tablet 0   Probiotic Product (PROBIOTIC DAILY PO) Take 1 capsule by mouth daily.     prochlorperazine  (COMPAZINE ) 10 MG tablet Take 10 mg by mouth 2 (two) times daily as needed for nausea  or vomiting.     Prucalopride Succinate  (MOTEGRITY ) 2 MG TABS 1 tab po qd 90 tablet 0   Tiotropium Bromide  Monohydrate (SPIRIVA  RESPIMAT) 2.5 MCG/ACT AERS Inhale 2 puffs into the lungs daily. 1 each 12   No current facility-administered medications for this visit.    Allergies  Allergen Reactions   Doxycycline  Shortness Of Breath and Swelling    Lips swelling and short of breath   Breo Ellipta  [Fluticasone  Furoate-Vilanterol] Other (See Comments)    Lower extremity weakness, lightheadedness    Demerol Hcl [Meperidine] Nausea And Vomiting   Fentanyl  Nausea And Vomiting    Can tolerate with nausea meds  Pt states all pain medications cause N/V for her    Fluocinolone Nausea And Vomiting   Imdur  [Isosorbide  Nitrate] Nausea And Vomiting   Penicillins Hives    Social History   Socioeconomic History   Marital status: Widowed    Spouse name: Not on file   Number of children: 1   Years of education: Not on file   Highest education level: 12th grade  Occupational History   Not on file  Tobacco Use   Smoking status: Former    Current packs/day: 0.00    Average packs/day: 1.3 packs/day for 38.0 years (47.5 ttl pk-yrs)    Types: Cigarettes    Start date: 09/26/1969    Quit date: 09/27/2007    Years since quitting: 16.8    Passive exposure: Past   Smokeless tobacco: Never  Vaping Use   Vaping status: Never Used  Substance and Sexual Activity   Alcohol use: No   Drug use: No   Sexual activity: Not Currently  Other Topics Concern   Not on file  Social History  Narrative   Widowed as of 2016 (husband died of viral illness while out of town in [place] , sudden), has one daughter.   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   Tobacco: 20 pack-yr hx, quit 2009.   Alcohol: rare.   No hx of alc or drug problems.   Exercise: walks 2.78miles on treadmill daily.   Social Drivers of Corporate investment banker Strain: Low Risk  (06/18/2024)    Overall Financial Resource Strain (CARDIA)    Difficulty of Paying Living Expenses: Not very hard  Food Insecurity: No Food Insecurity (06/18/2024)   Hunger Vital Sign    Worried About Running Out of Food in the Last Year: Never true    Ran Out of Food in the Last Year: Never true  Transportation Needs: No Transportation Needs (06/18/2024)   PRAPARE - Administrator, Civil Service (Medical): No    Lack of Transportation (Non-Medical): No  Physical Activity: Sufficiently Active (06/18/2024)   Exercise Vital Sign    Days of Exercise per Week: 7 days    Minutes of Exercise per Session: 50 min  Stress: No Stress Concern Present (06/18/2024)   [employer] of Occupational Health - Occupational Stress Questionnaire    Feeling of Stress: Not at all  Social Connections: Moderately Isolated (06/18/2024)   Social Connection and Isolation Panel    Frequency of Communication with Friends and Family: More than three times a week    Frequency of Social Gatherings with Friends and Family: Once a week    Attends Religious Services: 1 to 4 times per year    Active Member of [employer] or Organizations: No    Attends Banker Meetings: Not on file    Marital Status: Widowed  Intimate Partner Violence: Not At Risk (10/18/2023)   Humiliation, Afraid, Rape, and Kick questionnaire    Fear of Current or Ex-Partner: No    Emotionally Abused: No    Physically Abused: No    Sexually Abused: No    Family History  Problem Relation Age of Onset   Heart attack Mother    CAD Mother    Heart disease Mother        Before age 6   Hypertension Mother    Hyperlipidemia Mother    Hypertension Father    Pneumonia Father    Hyperlipidemia Father    Heart murmur Sister    Osteoporosis Paternal Grandmother    CAD Paternal Grandfather    Breast cancer Neg Hx    Colon cancer Neg Hx    Esophageal cancer Neg Hx    Rectal cancer Neg Hx    Stomach cancer Neg Hx    Colon polyps Neg Hx     Pancreatic cancer Neg Hx     Review of Systems:  As stated in the HPI and otherwise negative.   BP 118/60 (BP Location: Right Arm, Patient Position: Sitting, Cuff Size: Normal)   Pulse 80   Ht 5' 4 (1.626 m)   Wt 140 lb (63.5 kg)   SpO2 96%   BMI 24.03 kg/m   Physical Examination: General: Well developed, well nourished, NAD  HEENT: OP clear, mucus membranes moist  SKIN: warm, dry. No rashes. Neuro: No focal deficits  Musculoskeletal: Muscle strength 5/5 all ext  Psychiatric: Mood and affect normal  Neck: No JVD, no carotid bruits, no thyromegaly, no lymphadenopathy.  Lungs:Clear bilaterally, no wheezes, rhonci, crackles Cardiovascular: Regular rate  and rhythm. No murmurs, gallops or rubs. Abdomen:Soft. Bowel sounds present. Non-tender.  Extremities: No lower extremity edema. Pulses are 2 + in the bilateral DP/PT.  EKG:  EKG is not ordered today. The ekg ordered today demonstrates   Recent Labs: 01/28/2024: ALT 78; BUN 18; Creatinine, Ser 0.70; Hemoglobin 15.0; Platelets 213; Potassium 3.8; Sodium 142   Lipid Panel    Component Value Date/Time   CHOL 117 02/08/2024 1030   TRIG 127.0 02/08/2024 1030   HDL 45.80 02/08/2024 1030   CHOLHDL 3 02/08/2024 1030   VLDL 25.4 02/08/2024 1030   LDLCALC 46 02/08/2024 1030   LDLDIRECT 134.0 01/28/2021 0923     Wt Readings from Last 3 Encounters:  07/16/24 140 lb (63.5 kg)  07/03/24 144 lb (65.3 kg)  06/19/24 141 lb 9.6 oz (64.2 kg)    Assessment and Plan:   1. CAD without angina: No chest pain suggestive of angina. Continue Plavix , Lipitor and Zetia .   2. Carotid artery disease/PAD: Followed in [facility]. Stable carotid disease by dopplers in June 2025.   3. HTN: BP is well controlled. Continue current medications.  4. Hyperlipidemia: LDL 45 in May 2025. Continue Lipitor and Zetia .   Labs/ tests ordered today include:  No orders of the defined types were placed in this encounter.  Disposition:   F/U with me in one year    Signed, Lonni Cash, MD, [facility] 07/16/2024 4:15 PM    [facility] [address] [place], [place], [place]  [address] Phone: [phone]; Fax: [phone]

## 2024-07-23 DIAGNOSIS — K5909 Other constipation: Secondary | ICD-10-CM | POA: Diagnosis not present

## 2024-07-23 DIAGNOSIS — K3184 Gastroparesis: Secondary | ICD-10-CM | POA: Diagnosis not present

## 2024-08-12 ENCOUNTER — Other Ambulatory Visit: Payer: Self-pay | Admitting: Family Medicine

## 2024-08-12 DIAGNOSIS — Z1231 Encounter for screening mammogram for malignant neoplasm of breast: Secondary | ICD-10-CM

## 2024-08-12 DIAGNOSIS — Z0000 Encounter for general adult medical examination without abnormal findings: Secondary | ICD-10-CM

## 2024-08-13 ENCOUNTER — Encounter: Payer: Self-pay | Admitting: Family Medicine

## 2024-08-14 ENCOUNTER — Encounter: Payer: Self-pay | Admitting: Family Medicine

## 2024-08-14 ENCOUNTER — Ambulatory Visit (INDEPENDENT_AMBULATORY_CARE_PROVIDER_SITE_OTHER): Admitting: Family Medicine

## 2024-08-14 VITALS — BP 114/69 | HR 69 | Temp 97.6°F | Ht 64.0 in | Wt 141.4 lb

## 2024-08-14 DIAGNOSIS — Z0000 Encounter for general adult medical examination without abnormal findings: Secondary | ICD-10-CM

## 2024-08-14 DIAGNOSIS — E2839 Other primary ovarian failure: Secondary | ICD-10-CM

## 2024-08-14 DIAGNOSIS — F411 Generalized anxiety disorder: Secondary | ICD-10-CM

## 2024-08-14 DIAGNOSIS — Z122 Encounter for screening for malignant neoplasm of respiratory organs: Secondary | ICD-10-CM

## 2024-08-14 DIAGNOSIS — R7303 Prediabetes: Secondary | ICD-10-CM

## 2024-08-14 DIAGNOSIS — Z87891 Personal history of nicotine dependence: Secondary | ICD-10-CM | POA: Diagnosis not present

## 2024-08-14 DIAGNOSIS — E7800 Pure hypercholesterolemia, unspecified: Secondary | ICD-10-CM

## 2024-08-14 DIAGNOSIS — Z79899 Other long term (current) drug therapy: Secondary | ICD-10-CM

## 2024-08-14 DIAGNOSIS — M8580 Other specified disorders of bone density and structure, unspecified site: Secondary | ICD-10-CM | POA: Diagnosis not present

## 2024-08-14 LAB — CBC WITH DIFFERENTIAL/PLATELET
Basophils Absolute: 0 K/uL (ref 0.0–0.1)
Basophils Relative: 0.4 % (ref 0.0–3.0)
Eosinophils Absolute: 0.2 K/uL (ref 0.0–0.7)
Eosinophils Relative: 2.8 % (ref 0.0–5.0)
HCT: 45.9 % (ref 36.0–46.0)
Hemoglobin: 15.6 g/dL — ABNORMAL HIGH (ref 12.0–15.0)
Lymphocytes Relative: 37.9 % (ref 12.0–46.0)
Lymphs Abs: 2.2 K/uL (ref 0.7–4.0)
MCHC: 34 g/dL (ref 30.0–36.0)
MCV: 95 fl (ref 78.0–100.0)
Monocytes Absolute: 0.6 K/uL (ref 0.1–1.0)
Monocytes Relative: 10.2 % (ref 3.0–12.0)
Neutro Abs: 2.8 K/uL (ref 1.4–7.7)
Neutrophils Relative %: 48.7 % (ref 43.0–77.0)
Platelets: 239 K/uL (ref 150.0–400.0)
RBC: 4.83 Mil/uL (ref 3.87–5.11)
RDW: 14.1 % (ref 11.5–15.5)
WBC: 5.7 K/uL (ref 4.0–10.5)

## 2024-08-14 LAB — COMPREHENSIVE METABOLIC PANEL WITH GFR
ALT: 63 U/L — ABNORMAL HIGH (ref 0–35)
AST: 47 U/L — ABNORMAL HIGH (ref 0–37)
Albumin: 4.5 g/dL (ref 3.5–5.2)
Alkaline Phosphatase: 102 U/L (ref 39–117)
BUN: 20 mg/dL (ref 6–23)
CO2: 32 meq/L (ref 19–32)
Calcium: 10 mg/dL (ref 8.4–10.5)
Chloride: 104 meq/L (ref 96–112)
Creatinine, Ser: 0.82 mg/dL (ref 0.40–1.20)
GFR: 71.04 mL/min (ref >60.00)
Glucose, Bld: 94 mg/dL (ref 70–99)
Potassium: 4.8 meq/L (ref 3.5–5.1)
Sodium: 141 meq/L (ref 135–145)
Total Bilirubin: 1.8 mg/dL — ABNORMAL HIGH (ref 0.2–1.2)
Total Protein: 6.9 g/dL (ref 6.0–8.3)

## 2024-08-14 LAB — LIPID PANEL
Cholesterol: 94 mg/dL (ref 0–200)
HDL: 38.7 mg/dL — ABNORMAL LOW (ref >39.00)
LDL Cholesterol: 27 mg/dL (ref 0–99)
NonHDL: 55.14
Total CHOL/HDL Ratio: 2
Triglycerides: 139 mg/dL (ref 0.0–149.0)
VLDL: 27.8 mg/dL (ref 0.0–40.0)

## 2024-08-14 MED ORDER — FLUTICASONE PROPIONATE 50 MCG/ACT NA SUSP: 2.0000 | Freq: Every day | NASAL | 3 refills | Status: AC

## 2024-08-14 MED ORDER — PRUCALOPRIDE SUCCINATE 2 MG PO TABS: ORAL_TABLET | ORAL | 3 refills | Status: AC

## 2024-08-14 MED ORDER — GABAPENTIN 300 MG PO CAPS: 300.0000 mg | ORAL_CAPSULE | Freq: Every day | ORAL | 3 refills | Status: AC

## 2024-08-14 MED ORDER — DIAZEPAM 5 MG PO TABS: ORAL_TABLET | ORAL | 5 refills | Status: AC

## 2024-08-14 NOTE — Addendum Note (Signed)
 Addended by: FLETA CARE D on: 08/14/2024 09:18 AM   Modules accepted: Orders

## 2024-08-14 NOTE — Progress Notes (Signed)
 Office Note 08/14/2024  CC:  Chief Complaint  Patient presents with   Annual Exam    Pt is fasting   Patient is a 73 y.o. [name] who is here for annual health maintenance exam and 6 mo follow-up prediabetes, hypercholesterolemia, and anxiety. A/P as of last visit: 1) Hypercholesterolemia, tolerating a atorvastatin  80 mg a day and Zetia  10 mg a day. Goal LDL less than 70. LDL 6 months ago was 51. Lipid panel today.   2) generalized anxiety disorder. Doing well long-term on diazepam  5 mg twice a day. Controlled substance contract and urine drug screen are up-to-date. Diazepam  refilled today, #60, refill x 5.   #3 gastroparesis. GI following.  Was [place], now [facility]. She is doing very well to try to increase protein but minimize carbohydrates.  She has gained 6 pounds in the last 6 months.   #4 prediabetes, doing great-->POC Hba1c today is 5.6%!  INTERIM HX: He is doing well. Her left knee still bothers her.  It does not swell or turn red.  It hurts over the lateral femoral condyle.  She wears a soft knee sleeve.  She still walks on the treadmill for an hour a day.  She was seen at the [facility] clinic on 07/23/2024 for a follow-up. All stable.  Diazepam  one daily helps signif control her anxiety. PMP AWARE reviewed today: most recent rx for diazepam  was filled 08/11/2024, # 60, rx by me. Most recent gabapentin  prescription filled 05/23/2024, #90, prescription by me. No red flags.   Past Medical History:  Diagnosis Date   Abdominal bruit 04/2017   Aortic u/s showed NO ANEURISM.   Allergy    Anemia    low iron   Anxiety    CAD (coronary artery disease) 07/17/2023   -Cardiac catheterization 02/08/12: no CAD  -Myoview  02/05/2019: EF 56, no ischemia, low risk -CCTA 12/21/20: CAC score 907 (97th percentile); LM 0-24, LAD prox 25-49, RCA 50-69; FFR normal    Carotid artery occlusion    hx L carotid stent   Cataract    COPD  (chronic obstructive pulmonary disease) (HCC)    DOE, worse since 11/2018.  Stress test neg 01/2019.  Albut x 1 inh prn helpful some.  Pulm 06/2019, PFTs not diag of COPD, DOE likely from deconditioning.   Coronary artery disease    Elevated transaminase level    Very mild, 2024.  Liver normal on ultrasound.   Essential hypertension, benign 05/22/2014   Gastroparesis 12/2022   delayed emptying on scan->reglan  (Dr. Shyrl)   GERD (gastroesophageal reflux disease)    + hx of esophagitis   Bertrum syndrome    H/O hiatal hernia 02/2019   EGD-->moderate size->surgery recommended but pt declined.   Hematochezia 2020/21   internal hemorrhoids   History of iron deficiency anemia 07/2019   Dr. Terrill EGD and colonoscopy, some anorectal muscosal ulceration, o/w just mild chronic gastritis.   Also could be poor absorption due to chronic high dose acid suppression. Hb/iron normalized aft iron infus and ongoing oral iron.   History of kidney stones    one time   ILD (interstitial lung disease) (HCC)    Detected on lung cancer screening CT 01/2022.  ILD blood panel all normal except weakly + myositis ab's.  Pt to be evaluated by Dr. Geronimo asymptomatic as of 03/2022.   Mitral valve prolapse    Mixed hyperlipidemia 05/22/2014   OAB (overactive bladder)    detrol LA from urol helpful  Osteopenia 06/06/2016   05/2016 DEXA T-score -1.5.  06/2018 DEXA T score -1.1.  T score -1.2 06/2021   Peripheral vascular disease, unspecified 05/22/2014   innominate and carotid.  U/S f/u 12/2016 showed no signif change compared to 2017--vascular recommended repeat 1 yr.  Doing well as of 08/2018 cardiol f/u-->continued on ASA and Plavix . F/u 08/2019->ASA d/c'd, plavix  continued.   Prediabetes 2018   2018 A1c 6.3%.  05/2018 A1c 6.1%. 07/2019 A1c 6.4%.   Raynaud disease    Seasonal allergies    Stroke [facility]) 2006   TIA (transient ischemic attack) 2006    Past Surgical History:  Procedure Laterality Date    ABDOMINAL HYSTERECTOMY  1997   CARDIAC CATHETERIZATION  01/2012   NORMAL   CARDIOVASCULAR STRESS TEST  02/05/2019   NORMAL.  EF normal.   CAROTID ENDARTERECTOMY  08/21/2007   right - Dr. Dyane   CESAREAN SECTION  35 years ago   CHOLECYSTECTOMY  2000   COLONOSCOPY  approx 2008; 09/23/19   2008 (Dr. Gareld pt->normal.  08/2019 adenoma, some friable areas of colon + anorectal mucosa inflam w/ ulceration, +diverticulosis. Recall 5 yrs   DEXA  06/06/2016   05/2016 T-score -1.5.  06/2018 T score -1.1.  T score -1.2 06/2021.   ESOPHAGOGASTRODUODENOSCOPY  03/11/2019; 09/23/19   02/2019; 5 cm hiatal hernia; GI suggested surgical repair due to intractable GER.  08/2019 mild chronic gastritis, h pylori neg, multiple benign bx's including duodenum neg for celiac changes.   ESOPHAGOGASTRODUODENOSCOPY N/A 01/18/2021   Procedure: ESOPHAGOGASTRODUODENOSCOPY (EGD);  Surgeon: Shyrl Linnie KIDD, MD;  Location: [facility] [place] OR;  Service: Thoracic;  Laterality: N/A;   ESOPHAGOGASTRODUODENOSCOPY N/A 05/12/2022   Procedure: ESOPHAGOGASTRODUODENOSCOPY (EGD);  Surgeon: Shyrl Linnie KIDD, MD;  Location: [facility] OR;  Service: Thoracic;  Laterality: N/A;   HERNIA REPAIR  3 times 27 years ago   2 hiatal hernia surgeries   Incision and drainage of left thenar space abscess  02/23/2015   s/p cat bite (Dr. Camella)   innominate stent  2007   12/2014 u/s showed patent stent in innominate and L common carotid.  L prox int carotid Laura-59% stenosis   LDCT for lung ca screening  06/2019   NEG->rpt 1 yr   Left common carotid stent  2006 and 2007                                                                                                   PEXY N/A 05/12/2022   Procedure: GASTROPEXY;  Surgeon: Shyrl Linnie KIDD, MD;  Location: [facility] OR;  Service: Thoracic;  Laterality: N/A;   SINOSCOPY     TRANSTHORACIC ECHOCARDIOGRAM  2007   NORMAL   TUBAL LIGATION     UPPER GI ENDOSCOPY  10/13/2023   with biopsies   US  CAROTID DOPPLER  BILATERAL ([facility] HX)  06/2019   left stent w/out stenosis.  R ICA sten Laura-50% obst-stable.  07/20/21 R clear, L Laura-59%, normal vert and subclav flow.  07/2022 stable   XI ROBOTIC ASSISTED HIATAL HERNIA REPAIR N/A 05/12/2022   Procedure: XI ROBOTIC ASSISTED HIATAL HERNIA REPAIR;  Surgeon: Shyrl Linnie KIDD, MD;  Location: [facility] OR;  Service: Thoracic;  Laterality: N/A;   XI ROBOTIC ASSISTED PARAESOPHAGEAL HERNIA REPAIR N/A 01/18/2021   Procedure: XI ROBOTIC ASSISTED PARAESOPHAGEAL HERNIA REPAIR WITH FUNDOPLICATION WITH MESH;  Surgeon: Shyrl Linnie KIDD, MD;  Location: [facility] OR;  Service: Thoracic;  Laterality: N/A;    Family History  Problem Relation Age of Onset   Heart attack Mother    CAD Mother    Heart disease Mother        Before age 60   Hypertension Mother    Hyperlipidemia Mother    Early death Mother    Hypertension Father    Pneumonia Father    Hyperlipidemia Father    Heart murmur Sister    Osteoporosis Paternal Grandmother    CAD Paternal Grandfather    Breast cancer Neg Hx    Colon cancer Neg Hx    Esophageal cancer Neg Hx    Rectal cancer Neg Hx    Stomach cancer Neg Hx    Colon polyps Neg Hx    Pancreatic cancer Neg Hx     Social History   Socioeconomic History   Marital status: Widowed    Spouse name: Not on file   Number of children: 1   Years of education: Not on file   Highest education level: 12th grade  Occupational History   Not on file  Tobacco Use   Smoking status: Former    Current packs/day: 0.00    Average packs/day: 1.3 packs/day for 38.0 years (47.5 ttl pk-yrs)    Types: Cigarettes    Start date: 09/26/1969    Quit date: 09/27/2007    Years since quitting: 16.8    Passive exposure: Past   Smokeless tobacco: Never   Tobacco comments:    Quit years ago  Vaping Use   Vaping status: Never Used  Substance and Sexual Activity   Alcohol use: No   Drug use: No   Sexual activity: Not Currently    Birth control/protection: Abstinence  Other  Topics Concern   Not on file  Social History Narrative   Widowed as of 09/22/2015 (husband died of viral illness while out of town in [place] , sudden), has one daughter.   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   Tobacco: 20 pack-yr hx, quit 2008/09/21.   Alcohol: rare.   No hx of alc or drug problems.   Exercise: walks 2.29miles on treadmill daily.   Social Drivers of Corporate Investment Banker Strain: Low Risk  (08/10/2024)   Overall Financial Resource Strain (CARDIA)    Difficulty of Paying Living Expenses: Not very hard  Food Insecurity: No Food Insecurity (08/10/2024)   Hunger Vital Sign    Worried About Running Out of Food in the Last Year: Never true    Ran Out of Food in the Last Year: Never true  Transportation Needs: No Transportation Needs (08/10/2024)   PRAPARE - Administrator, Civil Service (Medical): No    Lack of Transportation (Non-Medical): No  Physical Activity: Sufficiently Active (08/10/2024)   Exercise Vital Sign    Days of Exercise per Week: 7 days    Minutes of Exercise per Session: 50 min  Stress: No Stress Concern Present (08/10/2024)   [employer] of Occupational Health - Occupational Stress Questionnaire    Feeling of Stress: Only a little  Social Connections: Moderately Isolated (08/10/2024)   Social Connection and Isolation  Panel    Frequency of Communication with Friends and Family: More than three times a week    Frequency of Social Gatherings with Friends and Family: Once a week    Attends Religious Services: 1 to 4 times per year    Active Member of [employer] or Organizations: No    Attends Banker Meetings: Not on file    Marital Status: Widowed  Intimate Partner Violence: Not At Risk (10/18/2023)   Humiliation, Afraid, Rape, and Kick questionnaire    Fear of Current or Ex-Partner: No    Emotionally Abused: No    Physically Abused: No    Sexually Abused: No    Outpatient  Medications Prior to Visit  Medication Sig Dispense Refill   acetaminophen  (TYLENOL ) 325 MG tablet Take 2 tablets (650 mg total) by mouth every 6 (six) hours as needed for mild pain or moderate pain.     albuterol  (VENTOLIN  HFA) 108 (90 Base) MCG/ACT inhaler Inhale 2 puffs into the lungs every 6 (six) hours as needed for wheezing or shortness of breath. 8 g 6   atorvastatin  (LIPITOR) 80 MG tablet Take 1 tablet (80 mg total) by mouth daily. 90 tablet 3   Calcium -Magnesium-Vitamin D (CALCIUM  1200+D3 PO) Take 1 tablet by mouth daily.     clopidogrel  (PLAVIX ) 75 MG tablet Take 1 tablet (75 mg total) by mouth daily. 90 tablet 3   dicyclomine (BENTYL) 10 MG capsule Take 10 mg by mouth 4 (four) times daily as needed for spasms.     estradiol (ESTRACE) 0.1 MG/GM vaginal cream Place 1 Applicatorful vaginally 2 (two) times a week.     ezetimibe  (ZETIA ) 10 MG tablet Take 1 tablet (10 mg total) by mouth daily. 90 tablet 3   famotidine  (PEPCID ) Laura MG tablet Take 1 tablet (Laura mg total) by mouth daily. 90 tablet 3   fexofenadine (ALLEGRA) 180 MG tablet Take 180 mg by mouth daily as needed for allergies.     Multiple Vitamins-Minerals (CENTRUM SILVER 50+WOMEN) TABS Take 1 tablet by mouth daily.     nitroGLYCERIN  (NITROSTAT ) 0.4 MG SL tablet Place 1 tablet (0.4 mg total) under the tongue every 5 (five) minutes as needed for chest pain. 25 tablet 6   pantoprazole  (PROTONIX ) Laura MG tablet Take 1 tablet (Laura mg total) by mouth daily. Patient needs follow up appointment for future refills. Please call [phone] to schedule an appointment. 30 tablet 0   Probiotic Product (PROBIOTIC DAILY PO) Take 1 capsule by mouth daily.     prochlorperazine  (COMPAZINE ) 10 MG tablet Take 10 mg by mouth 2 (two) times daily as needed for nausea or vomiting.     Tiotropium Bromide  Monohydrate (SPIRIVA  RESPIMAT) 2.5 MCG/ACT AERS Inhale 2 puffs into the lungs daily. 1 each 12   diazepam  (VALIUM ) 5 MG tablet 1 tab po bid as needed for  anxiety 60 tablet 5   fluticasone  (FLONASE ) 50 MCG/ACT nasal spray SPRAY 2 SPRAYS INTO EACH NOSTRIL EVERY DAY 48 mL 0   gabapentin  (NEURONTIN ) 300 MG capsule Take 1 capsule (300 mg total) by mouth at bedtime. 90 capsule 3   Prucalopride Succinate  (MOTEGRITY ) 2 MG TABS 1 tab po qd 90 tablet 0   No facility-administered medications prior to visit.    Allergies  Allergen Reactions   Doxycycline  Shortness Of Breath and Swelling    Lips swelling and short of breath   Breo Ellipta  [Fluticasone  Furoate-Vilanterol] Other (See Comments)    Lower extremity weakness, lightheadedness  Demerol Hcl [Meperidine] Nausea And Vomiting   Fentanyl  Nausea And Vomiting    Can tolerate with nausea meds  Pt states all pain medications cause N/V for her    Fluocinolone Nausea And Vomiting   Imdur  [Isosorbide  Nitrate] Nausea And Vomiting   Penicillins Hives    Review of Systems  Constitutional:  Negative for appetite change, chills, fatigue and fever.  HENT:  Negative for congestion, dental problem, ear pain and sore throat.   Eyes:  Negative for discharge, redness and visual disturbance.  Respiratory:  Negative for cough, chest tightness, shortness of breath and wheezing.   Cardiovascular:  Negative for chest pain, palpitations and leg swelling.  Gastrointestinal:  Negative for abdominal pain, blood in stool, diarrhea, nausea and vomiting.  Genitourinary:  Negative for difficulty urinating, dysuria, flank pain, frequency, hematuria and urgency.  Musculoskeletal:  Negative for arthralgias, back pain, joint swelling, myalgias and neck stiffness.  Skin:  Negative for pallor and rash.  Neurological:  Negative for dizziness, speech difficulty, weakness and headaches.  Hematological:  Negative for adenopathy. Does not bruise/bleed easily.  Psychiatric/Behavioral:  Negative for confusion and sleep disturbance. The patient is not nervous/anxious.     PE;    08/14/2024    8:28 AM 07/16/2024    3:23 PM  07/03/2024    2:22 PM  Vitals with BMI  Height 5' 4 5' 4 5' 4  Weight 141 lbs 6 oz 140 lbs 144 lbs  BMI 24.26 24.02 24.71  Systolic 114 118 886  Diastolic 69 60 67  Pulse 69 80 92   Gen: Alert, well appearing.  Patient is oriented to person, place, time, and situation. AFFECT: pleasant, lucid thought and speech. ENT: Ears: EACs clear, normal epithelium.  TMs with good light reflex and landmarks bilaterally.  Eyes: no injection, icteris, swelling, or exudate.  EOMI, PERRLA. Nose: no drainage or turbinate edema/swelling.  No injection or focal lesion.  Mouth: lips without lesion/swelling.  Oral mucosa pink and moist.  Dentition intact and without obvious caries or gingival swelling.  Oropharynx without erythema, exudate, or swelling.  Neck: supple/nontender.  No LAD, mass, or TM.  Carotid pulses 2+ bilaterally, without bruits. CV: RRR, no m/r/g.   LUNGS: CTA bilat, nonlabored resps, good aeration in all lung fields. ABD: soft, NT, ND, BS normal.  No hepatospenomegaly or mass.  No bruits. EXT: no clubbing, cyanosis, or edema.  Musculoskeletal: no joint swelling, erythema, warmth, or tenderness.  ROM of all joints intact. Skin - R upper chest wall with 1 cm irreg shaped pink crusty papular lesion.   Otherwise she has no sores or suspicious lesions or rashes or color changes  Pertinent labs:  Lab Results  Component Value Date   TSH 0.89 08/03/2021   Lab Results  Component Value Date   WBC 8.5 01/28/2024   HGB 15.0 01/28/2024   HCT 44.0 01/28/2024   MCV 92.7 01/28/2024   PLT 213 01/28/2024   Lab Results  Component Value Date   CREATININE 0.70 01/28/2024   BUN 18 01/28/2024   NA 142 01/28/2024   K 3.8 01/28/2024   CL 107 01/28/2024   CO2 23 01/28/2024   Lab Results  Component Value Date   ALT 78 (H) 01/28/2024   AST 61 (H) 01/28/2024   ALKPHOS 124 01/28/2024   BILITOT 1.7 (H) 01/28/2024   Lab Results  Component Value Date   CHOL 117 02/08/2024   Lab Results   Component Value Date   HDL 45.80 02/08/2024  Lab Results  Component Value Date   LDLCALC 46 02/08/2024   Lab Results  Component Value Date   TRIG 127.0 02/08/2024   Lab Results  Component Value Date   CHOLHDL 3 02/08/2024   Lab Results  Component Value Date   HGBA1C 5.6 02/08/2024   HGBA1C 5.6 02/08/2024   HGBA1C 5.6 (A) 02/08/2024   HGBA1C 5.6 02/08/2024   ASSESSMENT AND PLAN:   #1 health maintenance exam: Reviewed age and gender appropriate health maintenance issues (prudent diet, regular exercise, health risks of tobacco and excessive alcohol, use of seatbelts, fire alarms in home, use of sunscreen).  Also reviewed age and gender appropriate health screening as well as vaccine recommendations. Vaccines: Flu->UTD.  Otherwise UTD. Labs: CBC, c-Met, lipid panel, and Hba1c (prediabetes). Cervical ca screening:  pt is s/p hysterectomy for benign dx.  pt declines any further screening paps. Breast ca screening: mammogram scheduled for 08/12/24 Colon ca screening: recall 08/2024. Osteoporosis screening: 06/2021 DEXA w/osteopenia, plan rpt 06/2024->ordered today. LUNG CA screening: Lung-RADS 2, rpt was planned as of 01/2024-->ordered today. Pt no longer smokes.  #2 hypercholesterolemia. Doing well on 80 mg atorvastatin  daily and 10 mg Zetia  daily. Lipid panel and hepatic panel today.  3.  Prediabetes.  TLC. Monitor fasting glucose as well as hemoglobin A1c today.  4.  Right upper chest wall skin lesion.  AK versus SCC versus BCC. She will return for excision and we will send it to pathology.  #5 left knee pain since strain incident about 7 weeks ago.  It is improving a little bit.  She will continue soft knee sleeve most of the time but wear a hinged knee brace when she does her walking on the treadmill.  #6 GAD, doing well with Valium  5 mg twice daily as needed. Controlled subs contract updated.  Urine drug screen today.  An After Visit Summary was printed and given to  the patient.  FOLLOW UP:  Return in about 6 months (around 02/11/2025) for 42-month follow-up for RCI.  Appointment at her convenience for removal of skin lesion..  Signed:  Gerlene Hockey, MD           08/14/2024

## 2024-08-14 NOTE — Patient Instructions (Signed)
   It was very nice to see you today!   PLEASE NOTE:  If labs were collected or images ordered, we will inform you of  results once we have received them and reviewed. We will contact you either by echart message, or telephone call.  Please give ample time to the testing facility, and our office to run,  receive and review results. Please do not call inquiring of results, even if you can see them in your chart. We will contact you as soon as we are able. If it has been over 1 week since the test was completed, and you have not yet heard from us , then please call us .     - echart message- for normal results that have been seen by the patient already.   - telephone call: abnormal results or if patient has not viewed results in their echart.   If a referral to a specialist was entered for you, please call us  in 2 weeks if you have not heard from the specialist office to schedule.    Please try these tips to maintain a healthy lifestyle:  Eat most of your calories during the day when you are active. Eliminate processed foods including packaged sweets (pies, cakes, cookies), reduce intake of potatoes, white bread, white pasta, and white rice. Look for whole grain options, oat flour or almond flour.  Each meal should contain half fruits/vegetables, one quarter protein, and one quarter carbs (no bigger than a computer mouse).  Cut down on sweet beverages. This includes juice, soda, and sweet tea. Also watch fruit intake, though this is a healthier sweet option, it still contains natural sugar! Limit to 3 servings daily.  Drink at least 1 glass of water with each meal and aim for at least 8 glasses per day  Exercise at least 150 minutes every week.

## 2024-08-15 ENCOUNTER — Ambulatory Visit: Payer: Self-pay | Admitting: Family Medicine

## 2024-08-15 ENCOUNTER — Encounter: Payer: Self-pay | Admitting: Family Medicine

## 2024-08-15 LAB — HEMOGLOBIN A1C: Hgb A1c MFr Bld: 5.9 % (ref 4.6–6.5)

## 2024-08-16 ENCOUNTER — Other Ambulatory Visit: Payer: Self-pay

## 2024-08-16 ENCOUNTER — Other Ambulatory Visit

## 2024-08-16 DIAGNOSIS — R7402 Elevation of levels of lactic acid dehydrogenase (LDH): Secondary | ICD-10-CM

## 2024-08-16 DIAGNOSIS — R7401 Elevation of levels of liver transaminase levels: Secondary | ICD-10-CM | POA: Diagnosis not present

## 2024-08-16 DIAGNOSIS — D751 Secondary polycythemia: Secondary | ICD-10-CM

## 2024-08-16 LAB — IBC PANEL
Iron: 89 ug/dL (ref 42–145)
Saturation Ratios: 20 % (ref 20.0–50.0)
TIBC: 445.2 ug/dL (ref 250.0–450.0)
Transferrin: 318 mg/dL (ref 212.0–360.0)

## 2024-08-16 LAB — DRUG MONITOR, PANEL 1, SCREEN, URINE
Amphetamines: NEGATIVE ng/mL (ref <500)
Barbiturates: NEGATIVE ng/mL (ref <300)
Benzodiazepines: POSITIVE ng/mL — AB (ref <100)
Cocaine Metabolite: NEGATIVE ng/mL (ref <150)
Creatinine: 53.9 mg/dL (ref >=20.0)
Marijuana Metabolite: NEGATIVE ng/mL (ref <20)
Methadone Metabolite: NEGATIVE ng/mL (ref <100)
Opiates: NEGATIVE ng/mL (ref <100)
Oxidant: NEGATIVE ug/mL (ref <200)
Oxycodone: NEGATIVE ng/mL (ref <100)
Phencyclidine: NEGATIVE ng/mL (ref <25)
pH: 6 (ref 4.5–9.0)

## 2024-08-16 LAB — FERRITIN: Ferritin: 56.2 ng/mL (ref 10.0–291.0)

## 2024-08-16 LAB — DM TEMPLATE

## 2024-08-16 NOTE — Progress Notes (Signed)
Lab add on request faxed.

## 2024-08-16 NOTE — Addendum Note (Signed)
 Addended by: TRECIA ALMARIE GRADE on: 08/16/2024 11:45 AM   Modules accepted: Orders

## 2024-08-21 ENCOUNTER — Ambulatory Visit (INDEPENDENT_AMBULATORY_CARE_PROVIDER_SITE_OTHER): Admitting: Family Medicine

## 2024-08-21 ENCOUNTER — Encounter: Payer: Self-pay | Admitting: Family Medicine

## 2024-08-21 VITALS — BP 112/73 | HR 90 | Temp 97.4°F | Ht 64.0 in | Wt 141.4 lb

## 2024-08-21 DIAGNOSIS — L989 Disorder of the skin and subcutaneous tissue, unspecified: Secondary | ICD-10-CM | POA: Diagnosis not present

## 2024-08-21 NOTE — Progress Notes (Signed)
 OFFICE VISIT  08/21/2024  CC:  Chief Complaint  Patient presents with   Procedure    Skin lesion removal    Patient is a 73 y.o. female who presents for skin lesion excision.  HPI: I saw [name] 08/14/2024 and asked her to return for excision of right upper chest wall skin lesion, AK versus SCC/BCC suspected.  Past Medical History:  Diagnosis Date   Abdominal bruit 04/2017   Aortic u/s showed NO ANEURISM.   Anxiety    CAD (coronary artery disease) 07/17/2023   -Cardiac catheterization 02/08/12: no CAD  -Myoview  02/05/2019: EF 56, no ischemia, low risk -CCTA 12/21/20: CAC score 907 (97th percentile); LM 0-24, LAD prox 25-49, RCA 50-69; FFR normal    Carotid artery occlusion    hx L carotid stent   Cataract    COPD (chronic obstructive pulmonary disease) (HCC)    DOE, worse since 11/2018.  Stress test neg 01/2019.  Albut x 1 inh prn helpful some.  Pulm 06/2019, PFTs not diag of COPD, DOE likely from deconditioning.   Coronary artery disease    Elevated transaminase level    Very mild, 2024.  Liver normal on ultrasound.   Essential hypertension, benign 05/22/2014   Gastroparesis 12/2022   delayed emptying on scan->reglan  (Dr. Shyrl)   GERD (gastroesophageal reflux disease)    + hx of esophagitis   Bertrum syndrome    H/O hiatal hernia 02/2019   EGD-->moderate size->surgery recommended but pt declined.   History of iron deficiency anemia 07/2019   Dr. Terrill EGD and colonoscopy, some anorectal muscosal ulceration, o/w just mild chronic gastritis.   Also could be poor absorption due to chronic high dose acid suppression. Hb/iron normalized aft iron infus and ongoing oral iron.   History of kidney stones    one time   ILD (interstitial lung disease) (HCC)    Detected on lung cancer screening CT 01/2022.  ILD blood panel all normal except weakly + myositis ab's.  Pt to be evaluated by Dr. Geronimo asymptomatic as of 03/2022.   Mitral valve prolapse    Mixed hyperlipidemia  05/22/2014   OAB (overactive bladder)    detrol LA from urol helpful   Osteopenia 06/06/2016   05/2016 DEXA T-score -1.5.  06/2018 DEXA T score -1.1.  T score -1.2 06/2021   Peripheral vascular disease, unspecified 05/22/2014   innominate and carotid.  U/S f/u 12/2016 showed no signif change compared to 2017--vascular recommended repeat 1 yr.  Doing well as of 08/2018 cardiol f/u-->continued on ASA and Plavix . F/u 08/2019->ASA d/c'd, plavix  continued.   Prediabetes 2018   2018 A1c 6.3%.  05/2018 A1c 6.1%. 07/2019 A1c 6.4%.   Raynaud disease    Seasonal allergies    TIA (transient ischemic attack) 2006    Past Surgical History:  Procedure Laterality Date   ABDOMINAL HYSTERECTOMY  1997   CARDIAC CATHETERIZATION  01/2012   NORMAL   CARDIOVASCULAR STRESS TEST  02/05/2019   NORMAL.  EF normal.   CAROTID ENDARTERECTOMY  08/21/2007   right - Dr. Dyane   CESAREAN SECTION  35 years ago   CHOLECYSTECTOMY  2000   COLONOSCOPY  approx 2008; 09/23/19   2008 (Dr. Gareld pt->normal.  08/2019 adenoma, some friable areas of colon + anorectal mucosa inflam w/ ulceration, +diverticulosis. Recall 5 yrs   DEXA  06/06/2016   05/2016 T-score -1.5.  06/2018 T score -1.1.  T score -1.2 06/2021.   ESOPHAGOGASTRODUODENOSCOPY  03/11/2019; 09/23/19   02/2019; 5 cm hiatal hernia;  GI suggested surgical repair due to intractable GER.  08/2019 mild chronic gastritis, h pylori neg, multiple benign bx's including duodenum neg for celiac changes.   ESOPHAGOGASTRODUODENOSCOPY N/A 01/18/2021   Procedure: ESOPHAGOGASTRODUODENOSCOPY (EGD);  Surgeon: Shyrl Linnie KIDD, MD;  Location: [facility] OR;  Service: Thoracic;  Laterality: N/A;   ESOPHAGOGASTRODUODENOSCOPY N/A 05/12/2022   Procedure: ESOPHAGOGASTRODUODENOSCOPY (EGD);  Surgeon: Shyrl Linnie KIDD, MD;  Location: [facility] OR;  Service: Thoracic;  Laterality: N/A;   HERNIA REPAIR  3 times 27 years ago   2 hiatal hernia surgeries   Incision and drainage of left thenar space  abscess  02/23/2015   s/p cat bite (Dr. Camella)   innominate stent  2007   12/2014 u/s showed patent stent in innominate and L common carotid.  L prox int carotid 40-59% stenosis   LDCT for lung ca screening  06/2019   NEG->rpt 1 yr   Left common carotid stent  2006 and 2007                                                                                                   PEXY N/A 05/12/2022   Procedure: GASTROPEXY;  Surgeon: Shyrl Linnie KIDD, MD;  Location: [facility] OR;  Service: Thoracic;  Laterality: N/A;   SINOSCOPY     TRANSTHORACIC ECHOCARDIOGRAM  2007   NORMAL   TUBAL LIGATION     UPPER GI ENDOSCOPY  10/13/2023   with biopsies   US  CAROTID DOPPLER BILATERAL ([facility] HX)  06/2019   left stent w/out stenosis.  R ICA sten 40-50% obst-stable.  07/20/21 R clear, L 40-59%, normal vert and subclav flow.  07/2022 stable   XI ROBOTIC ASSISTED HIATAL HERNIA REPAIR N/A 05/12/2022   Procedure: XI ROBOTIC ASSISTED HIATAL HERNIA REPAIR;  Surgeon: Shyrl Linnie KIDD, MD;  Location: [facility] OR;  Service: Thoracic;  Laterality: N/A;   XI ROBOTIC ASSISTED PARAESOPHAGEAL HERNIA REPAIR N/A 01/18/2021   Procedure: XI ROBOTIC ASSISTED PARAESOPHAGEAL HERNIA REPAIR WITH FUNDOPLICATION WITH MESH;  Surgeon: Shyrl Linnie KIDD, MD;  Location: [facility] OR;  Service: Thoracic;  Laterality: N/A;    Outpatient Medications Prior to Visit  Medication Sig Dispense Refill   acetaminophen  (TYLENOL ) 325 MG tablet Take 2 tablets (650 mg total) by mouth every 6 (six) hours as needed for mild pain or moderate pain.     albuterol  (VENTOLIN  HFA) 108 (90 Base) MCG/ACT inhaler Inhale 2 puffs into the lungs every 6 (six) hours as needed for wheezing or shortness of breath. 8 g 6   atorvastatin  (LIPITOR) 80 MG tablet Take 1 tablet (80 mg total) by mouth daily. 90 tablet 3   Calcium -Magnesium-Vitamin D (CALCIUM  1200+D3 PO) Take 1 tablet by mouth daily.     clopidogrel  (PLAVIX ) 75 MG tablet Take 1 tablet (75 mg total) by mouth daily.  90 tablet 3   diazepam  (VALIUM ) 5 MG tablet 1 tab po bid as needed for anxiety 60 tablet 5   dicyclomine (BENTYL) 10 MG capsule Take 10 mg by mouth 4 (four) times daily as needed for spasms.     estradiol (ESTRACE) 0.1 MG/GM vaginal cream  Place 1 Applicatorful vaginally 2 (two) times a week.     ezetimibe  (ZETIA ) 10 MG tablet Take 1 tablet (10 mg total) by mouth daily. 90 tablet 3   famotidine  (PEPCID ) 40 MG tablet Take 1 tablet (40 mg total) by mouth daily. 90 tablet 3   fexofenadine (ALLEGRA) 180 MG tablet Take 180 mg by mouth daily as needed for allergies.     fluticasone  (FLONASE ) 50 MCG/ACT nasal spray Place 2 sprays into both nostrils daily. 48 mL 3   gabapentin  (NEURONTIN ) 300 MG capsule Take 1 capsule (300 mg total) by mouth at bedtime. 90 capsule 3   Multiple Vitamins-Minerals (CENTRUM SILVER 50+WOMEN) TABS Take 1 tablet by mouth daily.     nitroGLYCERIN  (NITROSTAT ) 0.4 MG SL tablet Place 1 tablet (0.4 mg total) under the tongue every 5 (five) minutes as needed for chest pain. 25 tablet 6   pantoprazole  (PROTONIX ) 40 MG tablet Take 1 tablet (40 mg total) by mouth daily. Patient needs follow up appointment for future refills. Please call [phone] to schedule an appointment. 30 tablet 0   Probiotic Product (PROBIOTIC DAILY PO) Take 1 capsule by mouth daily.     prochlorperazine  (COMPAZINE ) 10 MG tablet Take 10 mg by mouth 2 (two) times daily as needed for nausea or vomiting.     Prucalopride Succinate  (MOTEGRITY ) 2 MG TABS 1 tab po qd 90 tablet 3   Tiotropium Bromide  Monohydrate (SPIRIVA  RESPIMAT) 2.5 MCG/ACT AERS Inhale 2 puffs into the lungs daily. 1 each 12   No facility-administered medications prior to visit.    Allergies  Allergen Reactions   Doxycycline  Shortness Of Breath and Swelling    Lips swelling and short of breath   Breo Ellipta  [Fluticasone  Furoate-Vilanterol] Other (See Comments)    Lower extremity weakness, lightheadedness    Demerol Hcl [Meperidine] Nausea And  Vomiting   Fentanyl  Nausea And Vomiting    Can tolerate with nausea meds  Pt states all pain medications cause N/V for her    Fluocinolone Nausea And Vomiting   Imdur  [Isosorbide  Nitrate] Nausea And Vomiting   Penicillins Hives    Review of Systems  As per HPI  PE:    08/21/2024    1:34 PM 08/14/2024    8:28 AM 07/16/2024    3:23 PM  Vitals with BMI  Height 5' 4 5' 4 5' 4  Weight 141 lbs 6 oz 141 lbs 6 oz 140 lbs  BMI 24.26 24.26 24.02  Systolic 112 114 881  Diastolic 73 69 60  Pulse 90 69 80     Physical Exam  Gen: Alert, well appearing.  Patient is oriented to person, place, time, and situation. AFFECT: pleasant, lucid thought and speech. Skin - R upper chest wall with 1 cm irreg shaped pink crusty papular lesion.    LABS:  Last CBC Lab Results  Component Value Date   WBC 5.7 08/14/2024   HGB 15.6 (H) 08/14/2024   HCT 45.9 08/14/2024   MCV 95.0 08/14/2024   MCH 31.4 01/28/2024   RDW 14.1 08/14/2024   PLT 239.0 08/14/2024   Last metabolic panel Lab Results  Component Value Date   GLUCOSE 94 08/14/2024   NA 141 08/14/2024   K 4.8 08/14/2024   CL 104 08/14/2024   CO2 32 08/14/2024   BUN 20 08/14/2024   CREATININE 0.82 08/14/2024   GFR 71.04 08/14/2024   CALCIUM  10.0 08/14/2024   PROT 6.9 08/14/2024   ALBUMIN  4.5 08/14/2024   BILITOT 1.8 (H) 08/14/2024  ALKPHOS 102 08/14/2024   AST 47 (H) 08/14/2024   ALT 63 (H) 08/14/2024   ANIONGAP 8 01/28/2024   Last lipids Lab Results  Component Value Date   CHOL 94 08/14/2024   HDL 38.70 (L) 08/14/2024   LDLCALC 27 08/14/2024   LDLDIRECT 134.0 01/28/2021   TRIG 139.0 08/14/2024   CHOLHDL 2 08/14/2024   Last hemoglobin A1c Lab Results  Component Value Date   HGBA1C 5.9 08/14/2024   IMPRESSION AND PLAN:  Chest wall skin lesion. Procedure: skin lesion removal.  Consent obtained.  Area prepped and draped after being infiltrated with 2 cc of 1% lidocaine  with epi.  Sterile technique utilized to  excise the lesion entirely by using dermablade for shave excision.  Wound closure was not necessary. no immediate complications.  Pt tolerated procedure well. Specimen sent to pathology.  Wound care instructions discussed.  An After Visit Summary was printed and given to the patient.  FOLLOW UP: Return for as needed.  Signed:  Gerlene Hockey, MD           08/21/2024

## 2024-08-23 ENCOUNTER — Other Ambulatory Visit: Payer: Self-pay | Admitting: Family Medicine

## 2024-08-26 ENCOUNTER — Ambulatory Visit: Payer: Self-pay | Admitting: Family Medicine

## 2024-08-26 ENCOUNTER — Encounter: Payer: Self-pay | Admitting: Family Medicine

## 2024-08-26 DIAGNOSIS — D045 Carcinoma in situ of skin of trunk: Secondary | ICD-10-CM

## 2024-08-26 LAB — DERMATOLOGY PATHOLOGY

## 2024-08-29 ENCOUNTER — Ambulatory Visit: Payer: Self-pay

## 2024-08-29 NOTE — Telephone Encounter (Signed)
 FYI Only or Action Required?: FYI only for provider: Virtual UC for tomorrow due to schedule and snow. .  Patient was last seen in primary care on 08/21/2024 by McGowen, Aleene DEL, MD.  Called Nurse Triage reporting Cough.  Symptoms began a week ago.  Interventions attempted: Nothing.  Symptoms are: unchanged.  Triage Disposition: See Physician Within 24 Hours  Patient/caregiver understands and will follow disposition?: Yes   Copied from CRM #[id number]. Topic: Clinical - Red Word Triage >> Aug 29, 2024 12:06 PM Rea ORN wrote: Red Word that prompted transfer to Nurse Triage: coughing, chest congestion since after Thanksgiving >> Aug 29, 2024 12:33 PM Nessti S wrote: Pt had called back and held for 13 mins for NT but said she would like to receive a call back Reason for Disposition  SEVERE coughing spells (e.g., whooping sound after coughing, vomiting after coughing)  Answer Assessment - Initial Assessment Questions Cough since Friday after Thanksgiving. She states that she coughs so hard sometimes she can't catch her breath. Feels the cough is deeper. Denies any chest pain or shortness of breath. Cough is nonproductive. No fever.      1. ONSET: When did the cough begin?      About 5 days ago 2. SEVERITY: How bad is the cough today?      moderate 3. SPUTUM: Describe the color of your sputum (e.g., none, dry cough; clear, white, yellow, green)     none 4. HEMOPTYSIS: Are you coughing up any blood? If Yes, ask: How much? (e.g., flecks, streaks, tablespoons, etc.)     none 5. DIFFICULTY BREATHING: Are you having difficulty breathing? If Yes, ask: How bad is it? (e.g., mild, moderate, severe)      Only after hard coughing fits 6. FEVER: Do you have a fever? If Yes, ask: What is your temperature, how was it measured, and when did it start?     denies 7. CARDIAC HISTORY: Do you have any history of heart disease? (e.g., heart attack, congestive heart failure)       cad 8. LUNG HISTORY: Do you have any history of lung disease?  (e.g., pulmonary embolus, asthma, emphysema)     emphysema 9. OTHER SYMPTOMS: Do you have any other symptoms? (e.g., runny nose, wheezing, chest pain)       denies  Protocols used: Cough - Acute Non-Productive-A-AH

## 2024-08-30 ENCOUNTER — Telehealth

## 2024-08-30 DIAGNOSIS — J40 Bronchitis, not specified as acute or chronic: Secondary | ICD-10-CM

## 2024-08-30 MED ORDER — AZITHROMYCIN 250 MG PO TABS: ORAL_TABLET | ORAL | 0 refills | Status: AC

## 2024-08-30 MED ORDER — BENZONATATE 200 MG PO CAPS: 200.0000 mg | ORAL_CAPSULE | Freq: Three times a day (TID) | ORAL | 0 refills | Status: AC | PRN

## 2024-08-30 NOTE — Patient Instructions (Signed)

## 2024-08-30 NOTE — Progress Notes (Signed)
 Virtual Visit Consent   [name], you are scheduled for a virtual visit with a [name] provider today. Just as with appointments in the office, your consent must be obtained to participate. Your consent will be active for this visit and any virtual visit you may have with one of our providers in the next 365 days. If you have a MyChart account, a copy of this consent can be sent to you electronically.  As this is a virtual visit, video technology does not allow for your provider to perform a traditional examination. This may limit your provider's ability to fully assess your condition. If your provider identifies any concerns that need to be evaluated in person or the need to arrange testing (such as labs, EKG, etc.), we will make arrangements to do so. Although advances in technology are sophisticated, we cannot ensure that it will always work on either your end or our end. If the connection with a video visit is poor, the visit may have to be switched to a telephone visit. With either a video or telephone visit, we are not always able to ensure that we have a secure connection.  By engaging in this virtual visit, you consent to the provision of healthcare and authorize for your insurance to be billed (if applicable) for the services provided during this visit. Depending on your insurance coverage, you may receive a charge related to this service.  I need to obtain your verbal consent now. Are you willing to proceed with your visit today? [name] has provided verbal consent on 08/30/2024 for a virtual visit (video or telephone). [name], FNP  Date: 08/30/2024 7:50 AM   Virtual Visit via Video Note   I, [name], connected with  [name]  ([id number], December 14, 1950) on 08/30/24 at  7:45 AM EST by a video-enabled telemedicine application and verified that I am speaking with the correct person using two identifiers.  Location: Patient: Virtual Visit Location Patient:  Home Provider: Virtual Visit Location Provider: Home Office   I discussed the limitations of evaluation and management by telemedicine and the availability of in person appointments. The patient expressed understanding and agreed to proceed.    History of Present Illness: [name] is a 73 y.o. who identifies as a female who was assigned female at birth, and is being seen today for diarrhea and nausea a week ago, then cough started and has been keeping her up, non productive. No wheezing, no fever. SABRA  HPI: HPI  Problems:  Patient Active Problem List   Diagnosis Date Noted   CAD (coronary artery disease) 07/17/2023   Flatulence 10/18/2022   Belching 10/18/2022   Other constipation 10/18/2022   S/P robot-assisted surgical procedure 05/12/2022   Other emphysema (HCC) 05/10/2021   Former smoker 05/10/2021   Hiatal hernia 01/18/2021   Overweight (BMI 25.0-29.9) 08/01/2019   DOE (dyspnea on exertion) 05/25/2019   Oral lesion 02/20/2019   Epistaxis, recurrent 02/20/2019   Mixed hyperlipidemia 05/22/2014   Essential hypertension, benign 05/22/2014   Peripheral vascular disease 05/22/2014   Aftercare following surgery of the circulatory system, NEC 09/17/2013   Carotid artery disease without cerebral infarction 08/06/2012    Allergies:  Allergies  Allergen Reactions   Doxycycline  Shortness Of Breath and Swelling    Lips swelling and short of breath   Breo Ellipta  [Fluticasone  Furoate-Vilanterol] Other (See Comments)    Lower extremity weakness, lightheadedness    Demerol Hcl [Meperidine] Nausea And Vomiting  Fentanyl  Nausea And Vomiting    Can tolerate with nausea meds  Pt states all pain medications cause N/V for her    Fluocinolone Nausea And Vomiting   Imdur  [Isosorbide  Nitrate] Nausea And Vomiting   Penicillins Hives   Medications:  Current Outpatient Medications:    acetaminophen  (TYLENOL ) 325 MG tablet, Take 2 tablets (650 mg total) by mouth every 6 (six) hours as  needed for mild pain or moderate pain., Disp: , Rfl:    albuterol  (VENTOLIN  HFA) 108 (90 Base) MCG/ACT inhaler, Inhale 2 puffs into the lungs every 6 (six) hours as needed for wheezing or shortness of breath., Disp: 8 g, Rfl: 6   atorvastatin  (LIPITOR) 80 MG tablet, Take 1 tablet (80 mg total) by mouth daily., Disp: 90 tablet, Rfl: 3   Calcium -Magnesium-Vitamin D (CALCIUM  1200+D3 PO), Take 1 tablet by mouth daily., Disp: , Rfl:    clopidogrel  (PLAVIX ) 75 MG tablet, Take 1 tablet (75 mg total) by mouth daily., Disp: 90 tablet, Rfl: 3   diazepam  (VALIUM ) 5 MG tablet, 1 tab po bid as needed for anxiety, Disp: 60 tablet, Rfl: 5   dicyclomine (BENTYL) 10 MG capsule, Take 10 mg by mouth 4 (four) times daily as needed for spasms., Disp: , Rfl:    estradiol (ESTRACE) 0.1 MG/GM vaginal cream, Place 1 Applicatorful vaginally 2 (two) times a week., Disp: , Rfl:    ezetimibe  (ZETIA ) 10 MG tablet, Take 1 tablet (10 mg total) by mouth daily., Disp: 90 tablet, Rfl: 3   famotidine  (PEPCID ) 40 MG tablet, Take 1 tablet (40 mg total) by mouth daily., Disp: 90 tablet, Rfl: 3   fexofenadine (ALLEGRA) 180 MG tablet, Take 180 mg by mouth daily as needed for allergies., Disp: , Rfl:    fluticasone  (FLONASE ) 50 MCG/ACT nasal spray, Place 2 sprays into both nostrils daily., Disp: 48 mL, Rfl: 3   gabapentin  (NEURONTIN ) 300 MG capsule, Take 1 capsule (300 mg total) by mouth at bedtime., Disp: 90 capsule, Rfl: 3   Multiple Vitamins-Minerals (CENTRUM SILVER 50+WOMEN) TABS, Take 1 tablet by mouth daily., Disp: , Rfl:    nitroGLYCERIN  (NITROSTAT ) 0.4 MG SL tablet, Place 1 tablet (0.4 mg total) under the tongue every 5 (five) minutes as needed for chest pain., Disp: 25 tablet, Rfl: 6   pantoprazole  (PROTONIX ) 40 MG tablet, Take 1 tablet (40 mg total) by mouth daily. Patient needs follow up appointment for future refills. Please call [phone] to schedule an appointment., Disp: 30 tablet, Rfl: 0   Probiotic Product (PROBIOTIC  DAILY PO), Take 1 capsule by mouth daily., Disp: , Rfl:    prochlorperazine  (COMPAZINE ) 10 MG tablet, Take 10 mg by mouth 2 (two) times daily as needed for nausea or vomiting., Disp: , Rfl:    Prucalopride Succinate  (MOTEGRITY ) 2 MG TABS, 1 tab po qd, Disp: 90 tablet, Rfl: 3   Tiotropium Bromide  Monohydrate (SPIRIVA  RESPIMAT) 2.5 MCG/ACT AERS, Inhale 2 puffs into the lungs daily., Disp: 1 each, Rfl: 12  Observations/Objective: Patient is well-developed, well-nourished in no acute distress.  Resting comfortably  at home.  Head is normocephalic, atraumatic.  No labored breathing.  Speech is clear and coherent with logical content.  Patient is alert and oriented at baseline.    Assessment and Plan: 1. Bronchitis (Primary)  Increase fluids, hold atb 2-3 days and start if sx persist or worsen. UC as needed.   Follow Up Instructions: I discussed the assessment and treatment plan with the patient. The patient was provided an opportunity  to ask questions and all were answered. The patient agreed with the plan and demonstrated an understanding of the instructions.  A copy of instructions were sent to the patient via MyChart unless otherwise noted below.     The patient was advised to call back or seek an in-person evaluation if the symptoms worsen or if the condition fails to improve as anticipated.    Amijah Timothy, FNP

## 2024-09-11 ENCOUNTER — Encounter

## 2024-09-11 DIAGNOSIS — Z1231 Encounter for screening mammogram for malignant neoplasm of breast: Secondary | ICD-10-CM

## 2024-09-17 ENCOUNTER — Ambulatory Visit

## 2024-10-04 NOTE — Progress Notes (Signed)
 "  Office Visit Note  Patient: [name]             Date of Birth: 1951/03/17           MRN: [id number]             PCP: Candise Aleene DEL, MD Referring: Candise Aleene DEL, MD Visit Date: 10/18/2024 Occupation: Data Unavailable  Subjective:  Pain in the Lower Back    History of Present Illness: [name] is a 74 y.o. female with Raynauds, osteoarthritis and lumbar spondylosis.  She returns today after her last visit in January 2025.  She states for the last 1 week she has been having lower back pain.  She does not recall any injury.  She states the pain is localized to the left lower back.  She states the pain is shooting down into her left lower extremity.  She is not having any upper back pain.  He continues to have Raynauds symptoms.  She controls her symptoms by keeping her core temperature warm.  She is scheduled to have repeat bone density in February 2026.    Activities of Daily Living:  Patient reports morning stiffness for 30 minutes.   Patient Reports nocturnal pain.  Difficulty dressing/grooming: Denies Difficulty climbing stairs: Denies Difficulty getting out of chair: Denies Difficulty using hands for taps, buttons, cutlery, and/or writing: Denies  Review of Systems  Constitutional:  Negative for fatigue.  HENT:  Negative for mouth sores and mouth dryness.   Eyes:  Positive for dryness.  Respiratory:  Negative for shortness of breath.   Cardiovascular:  Negative for chest pain and palpitations.  Gastrointestinal:  Positive for diarrhea. Negative for blood in stool and constipation.  Endocrine: Negative for increased urination.  Genitourinary:  Negative for involuntary urination.  Musculoskeletal:  Positive for joint pain, joint pain, myalgias, morning stiffness and myalgias. Negative for gait problem, joint swelling, muscle weakness and muscle tenderness.  Skin:  Positive for hair loss. Negative for color change, rash and sensitivity to sunlight.   Allergic/Immunologic: Negative for susceptible to infections.  Neurological:  Negative for dizziness and headaches.  Hematological:  Negative for swollen glands.  Psychiatric/Behavioral:  Negative for depressed mood and sleep disturbance. The patient is not nervous/anxious.     PMFS History:  Patient Active Problem List   Diagnosis Date Noted   CAD (coronary artery disease) 07/17/2023   Flatulence 10/18/2022   Belching 10/18/2022   Other constipation 10/18/2022   S/P robot-assisted surgical procedure 05/12/2022   Other emphysema (HCC) 05/10/2021   Former smoker 05/10/2021   Hiatal hernia 01/18/2021   Overweight (BMI 25.0-29.9) 08/01/2019   DOE (dyspnea on exertion) 05/25/2019   Oral lesion 02/20/2019   Epistaxis, recurrent 02/20/2019   Mixed hyperlipidemia 05/22/2014   Essential hypertension, benign 05/22/2014   Peripheral vascular disease 05/22/2014   Aftercare following surgery of the circulatory system, NEC 09/17/2013   Carotid artery disease without cerebral infarction 08/06/2012    Past Medical History:  Diagnosis Date   Abdominal bruit 04/2017   Aortic u/s showed NO ANEURISM.   Anxiety    CAD (coronary artery disease) 07/17/2023   -Cardiac catheterization 02/08/12: no CAD  -Myoview  02/05/2019: EF 56, no ischemia, low risk -CCTA 12/21/20: CAC score 907 (97th percentile); LM 0-24, LAD prox 25-49, RCA 50-69; FFR normal    Carotid artery occlusion    hx L carotid stent   Cataract    COPD (chronic obstructive pulmonary disease) (HCC)    DOE, worse  since 11/2018.  Stress test neg 01/2019.  Albut x 1 inh prn helpful some.  Pulm 06/2019, PFTs not diag of COPD, DOE likely from deconditioning.   Coronary artery disease    Elevated transaminase level    Very mild, 2024.  Liver normal on ultrasound.   Essential hypertension, benign 05/22/2014   Gastroparesis 12/2022   delayed emptying on scan->reglan  (Dr. Shyrl)   GERD (gastroesophageal reflux disease)    + hx of  esophagitis   Bertrum syndrome    H/O hiatal hernia 02/2019   EGD-->moderate size->surgery recommended but pt declined.   History of iron deficiency anemia 07/2019   Dr. Terrill EGD and colonoscopy, some anorectal muscosal ulceration, o/w just mild chronic gastritis.   Also could be poor absorption due to chronic high dose acid suppression. Hb/iron normalized aft iron infus and ongoing oral iron.   History of kidney stones    one time   ILD (interstitial lung disease) (HCC)    Detected on lung cancer screening CT 01/2022.  ILD blood panel all normal except weakly + myositis ab's.  Pt to be evaluated by Dr. Geronimo asymptomatic as of 03/2022.   Mitral valve prolapse    Mixed hyperlipidemia 05/22/2014   OAB (overactive bladder)    detrol LA from urol helpful   Osteopenia 06/06/2016   05/2016 DEXA T-score -1.5.  06/2018 DEXA T score -1.1.  T score -1.2 06/2021   Peripheral vascular disease, unspecified 05/22/2014   innominate and carotid.  U/S f/u 12/2016 showed no signif change compared to 2017--vascular recommended repeat 1 yr.  Doing well as of 08/2018 cardiol f/u-->continued on ASA and Plavix . F/u 08/2019->ASA d/c'd, plavix  continued.   Prediabetes 2018   2018 A1c 6.3%.  05/2018 A1c 6.1%. 07/2019 A1c 6.4%.   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    08/2024   TIA (transient ischemic attack) 2006    Family History  Problem Relation Age of Onset   Heart attack Mother    CAD Mother    Heart disease Mother        Before age 39   Hypertension Mother    Hyperlipidemia Mother    Early death Mother    Hypertension Father    Pneumonia Father    Hyperlipidemia Father    Heart murmur Sister    Osteoporosis Paternal Grandmother    CAD Paternal Grandfather    Breast cancer Neg Hx    Colon cancer Neg Hx    Esophageal cancer Neg Hx    Rectal cancer Neg Hx    Stomach cancer Neg Hx    Colon polyps Neg Hx    Pancreatic cancer  Neg Hx    Past Surgical History:  Procedure Laterality Date   ABDOMINAL HYSTERECTOMY  1997   CARDIAC CATHETERIZATION  01/2012   NORMAL   CARDIOVASCULAR STRESS TEST  02/05/2019   NORMAL.  EF normal.   CAROTID ENDARTERECTOMY  08/21/2007   right - Dr. Dyane   CESAREAN SECTION  35 years ago   CHOLECYSTECTOMY  2000   COLONOSCOPY  approx 2008; 09/23/19   2008 (Dr. Gareld pt->normal.  08/2019 adenoma, some friable areas of colon + anorectal mucosa inflam w/ ulceration, +diverticulosis. Recall 5 yrs   DEXA  06/06/2016   05/2016 T-score -1.5.  06/2018 T score -1.1.  T score -1.2 06/2021.   ESOPHAGOGASTRODUODENOSCOPY  03/11/2019; 09/23/19   02/2019; 5 cm hiatal hernia; GI suggested  surgical repair due to intractable GER.  08/2019 mild chronic gastritis, h pylori neg, multiple benign bx's including duodenum neg for celiac changes.   ESOPHAGOGASTRODUODENOSCOPY N/A 01/18/2021   Procedure: ESOPHAGOGASTRODUODENOSCOPY (EGD);  Surgeon: Shyrl Linnie KIDD, MD;  Location: [facility] OR;  Service: Thoracic;  Laterality: N/A;   ESOPHAGOGASTRODUODENOSCOPY N/A 05/12/2022   Procedure: ESOPHAGOGASTRODUODENOSCOPY (EGD);  Surgeon: Shyrl Linnie KIDD, MD;  Location: [facility] OR;  Service: Thoracic;  Laterality: N/A;   HERNIA REPAIR  3 times 27 years ago   2 hiatal hernia surgeries   Incision and drainage of left thenar space abscess  02/23/2015   s/p cat bite (Dr. Camella)   innominate stent  2007   12/2014 u/s showed patent stent in innominate and L common carotid.  L prox int carotid 40-59% stenosis   LDCT for lung ca screening  06/2019   NEG->rpt 1 yr   Left common carotid stent  10/27/04 and 10-27-05                                                                                                   PEXY N/A 05/12/2022   Procedure: GASTROPEXY;  Surgeon: Shyrl Linnie KIDD, MD;  Location: [facility] OR;  Service: Thoracic;  Laterality: N/A;   SINOSCOPY     TRANSTHORACIC ECHOCARDIOGRAM  2005-10-27   NORMAL   TUBAL LIGATION      UPPER GI ENDOSCOPY  10/13/2023   with biopsies   US  CAROTID DOPPLER BILATERAL ([facility] HX)  06/2019   left stent w/out stenosis.  R ICA sten 40-50% obst-stable.  07/20/21 R clear, L 40-59%, normal vert and subclav flow.  07/2022 stable   XI ROBOTIC ASSISTED HIATAL HERNIA REPAIR N/A 05/12/2022   Procedure: XI ROBOTIC ASSISTED HIATAL HERNIA REPAIR;  Surgeon: Shyrl Linnie KIDD, MD;  Location: [facility] OR;  Service: Thoracic;  Laterality: N/A;   XI ROBOTIC ASSISTED PARAESOPHAGEAL HERNIA REPAIR N/A 01/18/2021   Procedure: XI ROBOTIC ASSISTED PARAESOPHAGEAL HERNIA REPAIR WITH FUNDOPLICATION WITH MESH;  Surgeon: Shyrl Linnie KIDD, MD;  Location: [facility] OR;  Service: Thoracic;  Laterality: N/A;   Social History[1] Social History   Social History Narrative   Widowed as of 2014-10-27 (husband died of viral illness while out of town in [place] , sudden), has one daughter.   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   Tobacco: 20 pack-yr hx, quit 28-Oct-2007.   Alcohol: rare.   No hx of alc or drug problems.   Exercise: walks 2.60miles on treadmill daily.     Immunization History  Administered Date(s) Administered   Fluad Quad(high Dose 65+) 06/11/2019, 06/27/2022, 07/03/2023   Hepatitis A, Adult 04/04/2023, 10/10/2023   Hepb-cpg 04/04/2023, 05/09/2023   INFLUENZA, HIGH DOSE SEASONAL PF 05/23/2018, 07/21/2020, 06/21/2024   Influenza-Unspecified 07/01/2021   Moderna Covid-19 Fall Seasonal Vaccine 110yrs & older 06/21/2024   Moderna Covid-19 Vaccine Bivalent Booster 24yrs & up 07/22/2021   Moderna Sars-Covid-2 Vaccination 10/23/2019, 11/20/2019, 07/21/2020   Pfizer Covid-19 Vaccine Bivalent Booster 49yrs & up 06/27/2022   Pneumococcal Conjugate-13 05/23/2016   Pneumococcal Polysaccharide-23 05/24/2017   Respiratory  Syncytial Virus Vaccine,Recomb Aduvanted(Arexvy) 07/15/2022   Tdap 02/22/2015   Unspecified SARS-COV-2 Vaccination 07/04/2023   Zoster Recombinant(Shingrix)  07/11/2018, 10/18/2018     Objective: Vital Signs: BP 119/76   Pulse 92   Temp (!) 97.5 F (36.4 C)   Resp 14   Ht 5' 4.5 (1.638 m)   Wt 146 lb 9.6 oz (66.5 kg)   BMI 24.78 kg/m    Physical Exam Vitals and nursing note reviewed.  Constitutional:      Appearance: She is well-developed.  HENT:     Head: Normocephalic and atraumatic.  Eyes:     Conjunctiva/sclera: Conjunctivae normal.  Cardiovascular:     Rate and Rhythm: Normal rate and regular rhythm.     Heart sounds: Normal heart sounds.  Pulmonary:     Effort: Pulmonary effort is normal.     Breath sounds: Normal breath sounds.  Abdominal:     General: Bowel sounds are normal.     Palpations: Abdomen is soft.  Musculoskeletal:     Cervical back: Normal range of motion.  Lymphadenopathy:     Cervical: No cervical adenopathy.  Skin:    General: Skin is warm and dry.     Capillary Refill: Capillary refill takes less than 2 seconds.  Neurological:     Mental Status: She is alert and oriented to person, place, and time.  Psychiatric:        Behavior: Behavior normal.      Musculoskeletal Exam: Cervical, thoracic and lumbar spine were in good range of motion.  She had discomfort range of motion of the lumbar spine.  There was no point tenderness over the lumbar spine.  There was tenderness over left SI joint.   Shoulder joints, elbow joints, wrist joints, MCPs, PIPs and DIPs were in good range of motion with no synovitis.  Hip joints and knee joints were in good range of motion without any warmth swelling or effusion.  There was no tenderness over ankles or MTPs.   CDAI Exam: CDAI Score: -- Patient Global: --; Provider Global: -- Swollen: --; Tender: -- Joint Exam 10/18/2024   No joint exam has been documented for this visit   There is currently no information documented on the homunculus. Go to the Rheumatology activity and complete the homunculus joint exam.  Investigation: No additional  findings.  Imaging: MM 3D SCREENING MAMMOGRAM BILATERAL BREAST Result Date: 10/10/2024 CLINICAL DATA:  Screening. EXAM: DIGITAL SCREENING BILATERAL MAMMOGRAM WITH TOMOSYNTHESIS AND CAD TECHNIQUE: Bilateral screening digital craniocaudal and mediolateral oblique mammograms were obtained. Bilateral screening digital breast tomosynthesis was performed. The images were evaluated with computer-aided detection. COMPARISON:  Previous exam(s). ACR Breast Density Category b: There are scattered areas of fibroglandular density. FINDINGS: There are no findings suspicious for malignancy. IMPRESSION: No mammographic evidence of malignancy. A result letter of this screening mammogram will be mailed directly to the patient. RECOMMENDATION: Screening mammogram in one year. (Code:SM-B-01Y) BI-RADS CATEGORY  1: Negative. Electronically Signed   By: Alm Parkins M.D.   On: 10/10/2024 07:38    Recent Labs: Lab Results  Component Value Date   WBC 5.7 08/14/2024   HGB 15.6 (H) 08/14/2024   PLT 239.0 08/14/2024   NA 141 08/14/2024   K 4.8 08/14/2024   CL 104 08/14/2024   CO2 32 08/14/2024   GLUCOSE 94 08/14/2024   BUN 20 08/14/2024   CREATININE 0.82 08/14/2024   BILITOT 1.8 (H) 08/14/2024   ALKPHOS 102 08/14/2024   AST 47 (H) 08/14/2024  ALT 63 (H) 08/14/2024   PROT 6.9 08/14/2024   ALBUMIN  4.5 08/14/2024   CALCIUM  10.0 08/14/2024   GFRAA >60 05/15/2020    Speciality Comments: No specialty comments available.  Procedures:  Sacroiliac Joint Inj on 10/18/2024 11:44 AM Indications: pain Details: 27 G 1.5 in needle, posterior approach Medications: 1 mL lidocaine  1 %; 40 mg triamcinolone  acetonide 40 MG/ML Aspirate: 0 mL Outcome: tolerated well, no immediate complications  Risk of infection, tendon injury, nerve injury, hypopigmentation and dermal atrophy were discussed. Procedure, treatment alternatives, risks and benefits explained, specific risks discussed. Consent was given by the patient.  Immediately prior to procedure a time out was called to verify the correct patient, procedure, equipment, support staff and site/side marked as required. Patient was prepped and draped in the usual sterile fashion.     Allergies: Doxycycline , Breo ellipta  [fluticasone  furoate-vilanterol], Demerol hcl [meperidine], Fentanyl , Fluocinolone, Imdur  [isosorbide  nitrate], and Penicillins   Assessment / Plan:     Visit Diagnoses: Raynaud's phenomenon without gangrene-patient states that she continues to have Raynaud's symptoms which are manageable with keeping cold temperature warm and warm clothing.  She had good capillary refill without any nailbed capillary changes.  No sclerodactyly or digital ulcers were noted.  Chronic bilateral thoracic back pain-she had no tenderness over thoracic region today.  Lumbar spondylosis -she has been having some lower lumbar discomfort specially on the left side.  Previous x-rays showed multilevel spondylosis, spondylolisthesis and facet joint arthropathy. - Plan: XR Lumbar Spine 2-3 Views.  Severe dextroscoliosis with multilevel spondylosis and facet joint arthropathy was noted.  X-ray findings were reviewed with the patient.  A handout on back exercises was given.  Chronic left SI joint pain -she tenderness over left SI joints.  Plan: XR Pelvis 1-2 Views.  X-rays were unremarkable.  X-ray findings were reviewed with patient.  Patient requested a left SI joint injection.  After informed consent was obtained and side effects were discussed left SI joint was injected as described above.  Patient tolerated the procedure well.  Postprocedure instructions were given.  Advised patient to contact us  if her symptoms do not improve.  The possibility of radiculopathy from the lumbar spine was also discussed.  However her SLR test was negative.  Osteopenia of multiple sites - DEXA scan October 2022: T-score -1.2.  Repeat DEXA scan is pending.  She has been taking calcium  and vitamin  D.  Essential hypertension, benign-blood pressure was normal today.  Mixed hyperlipidemia  Carotid artery disease without cerebral infarction  S/P robot-assisted surgical procedure-hiatal hernia repair x2  Peripheral vascular disease  Other emphysema (HCC)  Former smoker  Orders: Orders Placed This Encounter  Procedures   XR Lumbar Spine 2-3 Views   XR Pelvis 1-2 Views   No orders of the defined types were placed in this encounter.    Follow-Up Instructions: Return in about 1 year (around 10/18/2025) for Raynauds, osteoarthritis.   Maya Nash, MD  Note - This record has been created using Animal nutritionist.  Chart creation errors have been sought, but may not always  have been located. Such creation errors do not reflect on  the standard of medical care.     [1]  Social History Tobacco Use   Smoking status: Former    Current packs/day: 0.00    Average packs/day: 1.3 packs/day for 38.0 years (47.5 ttl pk-yrs)    Types: Cigarettes    Start date: 09/26/1969    Quit date: 09/27/2007    Years since quitting: 17.0  Passive exposure: Past   Smokeless tobacco: Never   Tobacco comments:    Quit years ago  Vaping Use   Vaping status: Never Used  Substance Use Topics   Alcohol use: No   Drug use: No   "

## 2024-10-08 ENCOUNTER — Ambulatory Visit
Admission: RE | Admit: 2024-10-08 | Discharge: 2024-10-08 | Disposition: A | Source: Ambulatory Visit | Attending: Family Medicine | Admitting: Family Medicine

## 2024-10-08 DIAGNOSIS — Z0000 Encounter for general adult medical examination without abnormal findings: Secondary | ICD-10-CM

## 2024-10-18 ENCOUNTER — Ambulatory Visit

## 2024-10-18 ENCOUNTER — Ambulatory Visit: Payer: PPO | Attending: Rheumatology | Admitting: Rheumatology

## 2024-10-18 ENCOUNTER — Encounter: Payer: Self-pay | Admitting: Rheumatology

## 2024-10-18 VITALS — BP 119/76 | HR 92 | Temp 97.5°F | Resp 14 | Ht 64.5 in | Wt 146.6 lb

## 2024-10-18 DIAGNOSIS — I779 Disorder of arteries and arterioles, unspecified: Secondary | ICD-10-CM

## 2024-10-18 DIAGNOSIS — I7300 Raynaud's syndrome without gangrene: Secondary | ICD-10-CM

## 2024-10-18 DIAGNOSIS — Z87891 Personal history of nicotine dependence: Secondary | ICD-10-CM | POA: Diagnosis not present

## 2024-10-18 DIAGNOSIS — J438 Other emphysema: Secondary | ICD-10-CM | POA: Diagnosis not present

## 2024-10-18 DIAGNOSIS — I739 Peripheral vascular disease, unspecified: Secondary | ICD-10-CM

## 2024-10-18 DIAGNOSIS — Z9889 Other specified postprocedural states: Secondary | ICD-10-CM | POA: Diagnosis not present

## 2024-10-18 DIAGNOSIS — M533 Sacrococcygeal disorders, not elsewhere classified: Secondary | ICD-10-CM

## 2024-10-18 DIAGNOSIS — G8929 Other chronic pain: Secondary | ICD-10-CM

## 2024-10-18 DIAGNOSIS — E782 Mixed hyperlipidemia: Secondary | ICD-10-CM | POA: Diagnosis not present

## 2024-10-18 DIAGNOSIS — M8589 Other specified disorders of bone density and structure, multiple sites: Secondary | ICD-10-CM

## 2024-10-18 DIAGNOSIS — I10 Essential (primary) hypertension: Secondary | ICD-10-CM | POA: Diagnosis not present

## 2024-10-18 DIAGNOSIS — M546 Pain in thoracic spine: Secondary | ICD-10-CM

## 2024-10-18 DIAGNOSIS — M47816 Spondylosis without myelopathy or radiculopathy, lumbar region: Secondary | ICD-10-CM | POA: Diagnosis not present

## 2024-10-18 MED ORDER — LIDOCAINE HCL 1 % IJ SOLN
1.0000 mL | INTRAMUSCULAR | Status: AC | PRN
  Administered 2024-10-18: 1 mL

## 2024-10-18 MED ORDER — TRIAMCINOLONE ACETONIDE 40 MG/ML IJ SUSP
40.0000 mg | INTRAMUSCULAR | Status: AC | PRN
  Administered 2024-10-18: 40 mg via INTRA_ARTICULAR

## 2024-10-18 NOTE — Patient Instructions (Signed)
 Low Back Sprain or Strain Rehab Ask your health care provider which exercises are safe for you. Do exercises exactly as told by your health care provider and adjust them as directed. It is normal to feel mild stretching, pulling, tightness, or discomfort as you do these exercises. Stop right away if you feel sudden pain or your pain gets worse. Do not begin these exercises until told by your health care provider. Stretching and range-of-motion exercises These exercises warm up your muscles and joints and improve the movement and flexibility of your back. These exercises also help to relieve pain, numbness, and tingling. Lumbar rotation  Lie on your back on a firm bed or the floor with your knees bent. Straighten your arms out to your sides so each arm forms a 90-degree angle (right angle) with a side of your body. Slowly move (rotate) both of your knees to one side of your body until you feel a stretch in your lower back (lumbar). Try not to let your shoulders lift off the floor. Hold this position for __________ seconds. Tense your abdominal muscles and slowly move your knees back to the starting position. Repeat this exercise on the other side of your body. Repeat __________ times. Complete this exercise __________ times a day. Single knee to chest  Lie on your back on a firm bed or the floor with both legs straight. Bend one of your knees. Use your hands to move your knee up toward your chest until you feel a gentle stretch in your lower back and buttock. Hold your leg in this position by holding on to the front of your knee. Keep your other leg as straight as possible. Hold this position for __________ seconds. Slowly return to the starting position. Repeat with your other leg. Repeat __________ times. Complete this exercise __________ times a day. Prone extension on elbows  Lie on your abdomen on a firm bed or the floor (prone position). Prop yourself up on your elbows. Use your arms  to help lift your chest up until you feel a gentle stretch in your abdomen and your lower back. This will place some of your body weight on your elbows. If this is uncomfortable, try stacking pillows under your chest. Your hips should stay down, against the surface that you are lying on. Keep your hip and back muscles relaxed. Hold this position for __________ seconds. Slowly relax your upper body and return to the starting position. Repeat __________ times. Complete this exercise __________ times a day. Strengthening exercises These exercises build strength and endurance in your back. Endurance is the ability to use your muscles for a long time, even after they get tired. Pelvic tilt This exercise strengthens the muscles that lie deep in the abdomen. Lie on your back on a firm bed or the floor with your legs extended. Bend your knees so they are pointing toward the ceiling and your feet are flat on the floor. Tighten your lower abdominal muscles to press your lower back against the floor. This motion will tilt your pelvis so your tailbone points up toward the ceiling instead of pointing to your feet or the floor. To help with this exercise, you may place a small towel under your lower back and try to push your back into the towel. Hold this position for __________ seconds. Let your muscles relax completely before you repeat this exercise. Repeat __________ times. Complete this exercise __________ times a day. Alternating arm and leg raises  Get on your hands  and knees on a firm surface. If you are on a hard floor, you may want to use padding, such as an exercise mat, to cushion your knees. Line up your arms and legs. Your hands should be directly below your shoulders, and your knees should be directly below your hips. Lift your left leg behind you. At the same time, raise your right arm and straighten it in front of you. Do not lift your leg higher than your hip. Do not lift your arm higher  than your shoulder. Keep your abdominal and back muscles tight. Keep your hips facing the ground. Do not arch your back. Keep your balance carefully, and do not hold your breath. Hold this position for __________ seconds. Slowly return to the starting position. Repeat with your right leg and your left arm. Repeat __________ times. Complete this exercise __________ times a day. Abdominal set with straight leg raise  Lie on your back on a firm bed or the floor. Bend one of your knees and keep your other leg straight. Tense your abdominal muscles and lift your straight leg up, 4-6 inches (10-15 cm) off the ground. Keep your abdominal muscles tight and hold this position for __________ seconds. Do not hold your breath. Do not arch your back. Keep it flat against the ground. Keep your abdominal muscles tense as you slowly lower your leg back to the starting position. Repeat with your other leg. Repeat __________ times. Complete this exercise __________ times a day. Single leg lower with bent knees Lie on your back on a firm bed or the floor. Tense your abdominal muscles and lift your feet off the floor, one foot at a time, so your knees and hips are bent in 90-degree angles (right angles). Your knees should be over your hips and your lower legs should be parallel to the floor. Keeping your abdominal muscles tense and your knee bent, slowly lower one of your legs so your toe touches the ground. Lift your leg back up to return to the starting position. Do not hold your breath. Do not let your back arch. Keep your back flat against the ground. Repeat with your other leg. Repeat __________ times. Complete this exercise __________ times a day. Posture and body mechanics Good posture and healthy body mechanics can help to relieve stress in your body's tissues and joints. Body mechanics refers to the movements and positions of your body while you do your daily activities. Posture is part of body  mechanics. Good posture means: Your spine is in its natural S-curve position (neutral). Your shoulders are pulled back slightly. Your head is not tipped forward (neutral). Follow these guidelines to improve your posture and body mechanics in your everyday activities. Standing  When standing, keep your spine neutral and your feet about hip-width apart. Keep a slight bend in your knees. Your ears, shoulders, and hips should line up. When you do a task in which you stand in one place for a long time, place one foot up on a stable object that is 2-4 inches (5-10 cm) high, such as a footstool. This helps keep your spine neutral. Sitting  When sitting, keep your spine neutral and keep your feet flat on the floor. Use a footrest, if necessary, and keep your thighs parallel to the floor. Avoid rounding your shoulders, and avoid tilting your head forward. When working at a desk or a computer, keep your desk at a height where your hands are slightly lower than your elbows. Slide your  chair under your desk so you are close enough to maintain good posture. When working at a computer, place your monitor at a height where you are looking straight ahead and you do not have to tilt your head forward or downward to look at the screen. Resting When lying down and resting, avoid positions that are most painful for you. If you have pain with activities such as sitting, bending, stooping, or squatting, lie in a position in which your body does not bend very much. For example, avoid curling up on your side with your arms and knees near your chest (fetal position). If you have pain with activities such as standing for a long time or reaching with your arms, lie with your spine in a neutral position and bend your knees slightly. Try the following positions: Lying on your side with a pillow between your knees. Lying on your back with a pillow under your knees. Lifting  When lifting objects, keep your feet at least  shoulder-width apart and tighten your abdominal muscles. Bend your knees and hips and keep your spine neutral. It is important to lift using the strength of your legs, not your back. Do not lock your knees straight out. Always ask for help to lift heavy or awkward objects. This information is not intended to replace advice given to you by your health care provider. Make sure you discuss any questions you have with your health care provider. Document Revised: 01/16/2023 Document Reviewed: 11/30/2020 [employer] Patient Education  2024 [employer].

## 2024-10-29 ENCOUNTER — Ambulatory Visit (HOSPITAL_BASED_OUTPATIENT_CLINIC_OR_DEPARTMENT_OTHER)
Admission: RE | Admit: 2024-10-29 | Discharge: 2024-10-29 | Disposition: A | Source: Ambulatory Visit | Attending: Family Medicine | Admitting: Family Medicine

## 2024-10-29 ENCOUNTER — Encounter: Payer: Self-pay | Admitting: Family Medicine

## 2024-10-29 ENCOUNTER — Ambulatory Visit: Admitting: Family Medicine

## 2024-10-29 VITALS — BP 148/67 | HR 73 | Temp 98.0°F | Ht 64.5 in | Wt 145.2 lb

## 2024-10-29 DIAGNOSIS — S5002XA Contusion of left elbow, initial encounter: Secondary | ICD-10-CM

## 2024-10-29 DIAGNOSIS — M25522 Pain in left elbow: Secondary | ICD-10-CM

## 2024-10-29 DIAGNOSIS — W009XXA Unspecified fall due to ice and snow, initial encounter: Secondary | ICD-10-CM

## 2024-10-29 DIAGNOSIS — M545 Low back pain, unspecified: Secondary | ICD-10-CM

## 2024-10-29 DIAGNOSIS — S300XXA Contusion of lower back and pelvis, initial encounter: Secondary | ICD-10-CM

## 2024-10-29 DIAGNOSIS — M533 Sacrococcygeal disorders, not elsewhere classified: Secondary | ICD-10-CM

## 2024-10-29 DIAGNOSIS — M7918 Myalgia, other site: Secondary | ICD-10-CM

## 2024-10-30 ENCOUNTER — Encounter: Payer: Self-pay | Admitting: Family Medicine

## 2024-10-30 NOTE — Telephone Encounter (Signed)
 Elbow x-ray shows no fracture or dislocation. Okay to work if you feel able. If you do not feel able we can do a work note for a while if you want. Let us  know.

## 2024-10-30 NOTE — Telephone Encounter (Signed)
 No further action needed at this time.

## 2024-11-06 ENCOUNTER — Ambulatory Visit: Admitting: Dermatology

## 2024-11-11 ENCOUNTER — Encounter: Admitting: Dermatology

## 2024-11-13 ENCOUNTER — Other Ambulatory Visit

## 2025-10-17 ENCOUNTER — Ambulatory Visit: Admitting: Rheumatology
